# Patient Record
Sex: Male | Born: 1955 | Race: White | Hispanic: No | Marital: Married | Smoking: Current some day smoker
Health system: Southern US, Community
[De-identification: ages and names within clinical notes are randomized; demographics above are authoritative.]

## PROBLEM LIST (undated history)

## (undated) DIAGNOSIS — J479 Bronchiectasis, uncomplicated: Secondary | ICD-10-CM

## (undated) DIAGNOSIS — Z87438 Personal history of other diseases of male genital organs: Secondary | ICD-10-CM

## (undated) DIAGNOSIS — G20 Parkinson's disease: Secondary | ICD-10-CM

## (undated) DIAGNOSIS — N529 Male erectile dysfunction, unspecified: Secondary | ICD-10-CM

## (undated) DIAGNOSIS — Z8619 Personal history of other infectious and parasitic diseases: Secondary | ICD-10-CM

## (undated) DIAGNOSIS — N183 Chronic kidney disease, stage 3 unspecified: Secondary | ICD-10-CM

## (undated) DIAGNOSIS — R6889 Other general symptoms and signs: Secondary | ICD-10-CM

## (undated) DIAGNOSIS — K219 Gastro-esophageal reflux disease without esophagitis: Secondary | ICD-10-CM

## (undated) DIAGNOSIS — M1732 Unilateral post-traumatic osteoarthritis, left knee: Secondary | ICD-10-CM

## (undated) DIAGNOSIS — D721 Eosinophilia, unspecified: Secondary | ICD-10-CM

## (undated) DIAGNOSIS — Z87898 Personal history of other specified conditions: Secondary | ICD-10-CM

## (undated) DIAGNOSIS — J45909 Unspecified asthma, uncomplicated: Secondary | ICD-10-CM

## (undated) DIAGNOSIS — G20B1 Parkinson's disease with dyskinesia, without mention of fluctuations: Secondary | ICD-10-CM

## (undated) DIAGNOSIS — R21 Rash and other nonspecific skin eruption: Secondary | ICD-10-CM

## (undated) DIAGNOSIS — R569 Unspecified convulsions: Secondary | ICD-10-CM

## (undated) DIAGNOSIS — I700 Atherosclerosis of aorta: Secondary | ICD-10-CM

## (undated) DIAGNOSIS — R3129 Other microscopic hematuria: Secondary | ICD-10-CM

## (undated) DIAGNOSIS — J189 Pneumonia, unspecified organism: Secondary | ICD-10-CM

## (undated) DIAGNOSIS — K429 Umbilical hernia without obstruction or gangrene: Secondary | ICD-10-CM

## (undated) DIAGNOSIS — G8929 Other chronic pain: Secondary | ICD-10-CM

## (undated) DIAGNOSIS — I2510 Atherosclerotic heart disease of native coronary artery without angina pectoris: Secondary | ICD-10-CM

## (undated) DIAGNOSIS — E781 Pure hyperglyceridemia: Secondary | ICD-10-CM

## (undated) DIAGNOSIS — G20A1 Parkinson's disease without dyskinesia, without mention of fluctuations: Secondary | ICD-10-CM

## (undated) DIAGNOSIS — M1990 Unspecified osteoarthritis, unspecified site: Secondary | ICD-10-CM

## (undated) HISTORY — DX: Other general symptoms and signs: R68.89

## (undated) HISTORY — DX: Atherosclerotic heart disease of native coronary artery without angina pectoris: I25.10

## (undated) HISTORY — DX: Personal history of other diseases of male genital organs: Z87.438

## (undated) HISTORY — DX: Chronic kidney disease, stage 3 unspecified: N18.30

## (undated) HISTORY — DX: Eosinophilia, unspecified: D72.10

## (undated) HISTORY — PX: KNEE ARTHROSCOPY: SHX127

## (undated) HISTORY — DX: Parkinson's disease with dyskinesia, without mention of fluctuations: G20.B1

## (undated) HISTORY — DX: Unilateral post-traumatic osteoarthritis, left knee: M17.32

## (undated) HISTORY — PX: COLONOSCOPY: SHX174

## (undated) HISTORY — PX: ROTATOR CUFF REPAIR: SHX139

## (undated) HISTORY — PX: BACK SURGERY: SHX140

## (undated) HISTORY — DX: Other chronic pain: G89.29

## (undated) HISTORY — DX: Atherosclerosis of aorta: I70.0

## (undated) HISTORY — DX: Umbilical hernia without obstruction or gangrene: K42.9

## (undated) HISTORY — DX: Unspecified convulsions: R56.9

## (undated) HISTORY — DX: Personal history of other infectious and parasitic diseases: Z86.19

## (undated) HISTORY — DX: Bronchiectasis, uncomplicated: J47.9

## (undated) HISTORY — PX: APPENDECTOMY: SHX54

## (undated) HISTORY — DX: Personal history of other specified conditions: Z87.898

## (undated) HISTORY — DX: Male erectile dysfunction, unspecified: N52.9

## (undated) HISTORY — DX: Pure hyperglyceridemia: E78.1

## (undated) HISTORY — DX: Parkinson's disease: G20

## (undated) HISTORY — DX: Other microscopic hematuria: R31.29

## (undated) HISTORY — PX: KNEE ARTHROSCOPY W/ ACL RECONSTRUCTION: SHX1858

## (undated) HISTORY — PX: RADIAL KERATOTOMY: SHX217

---

## 2004-08-09 ENCOUNTER — Emergency Department: Payer: Self-pay | Admitting: Emergency Medicine

## 2004-08-09 ENCOUNTER — Other Ambulatory Visit: Payer: Self-pay

## 2004-09-08 ENCOUNTER — Emergency Department: Payer: Self-pay | Admitting: Emergency Medicine

## 2004-09-23 ENCOUNTER — Emergency Department: Payer: Self-pay | Admitting: Emergency Medicine

## 2005-08-28 ENCOUNTER — Emergency Department: Payer: Self-pay | Admitting: Emergency Medicine

## 2005-09-06 ENCOUNTER — Other Ambulatory Visit: Payer: Self-pay

## 2005-09-06 ENCOUNTER — Inpatient Hospital Stay: Payer: Self-pay | Admitting: Internal Medicine

## 2006-11-15 ENCOUNTER — Ambulatory Visit: Payer: Self-pay | Admitting: Gastroenterology

## 2008-01-10 ENCOUNTER — Ambulatory Visit: Payer: Self-pay | Admitting: Unknown Physician Specialty

## 2008-01-18 ENCOUNTER — Ambulatory Visit: Payer: Self-pay | Admitting: Unknown Physician Specialty

## 2008-01-31 ENCOUNTER — Encounter: Payer: Self-pay | Admitting: Unknown Physician Specialty

## 2008-02-04 ENCOUNTER — Encounter: Payer: Self-pay | Admitting: Unknown Physician Specialty

## 2008-03-05 ENCOUNTER — Encounter: Payer: Self-pay | Admitting: Unknown Physician Specialty

## 2008-04-05 ENCOUNTER — Encounter: Payer: Self-pay | Admitting: Unknown Physician Specialty

## 2008-05-06 ENCOUNTER — Encounter: Payer: Self-pay | Admitting: Unknown Physician Specialty

## 2010-06-30 ENCOUNTER — Ambulatory Visit: Payer: Self-pay | Admitting: Neurology

## 2011-10-12 ENCOUNTER — Emergency Department: Payer: Self-pay | Admitting: Emergency Medicine

## 2011-10-15 ENCOUNTER — Encounter: Payer: Self-pay | Admitting: Emergency Medicine

## 2011-11-04 ENCOUNTER — Encounter: Payer: Self-pay | Admitting: Emergency Medicine

## 2011-12-05 ENCOUNTER — Encounter: Payer: Self-pay | Admitting: Emergency Medicine

## 2011-12-20 ENCOUNTER — Ambulatory Visit: Payer: Self-pay | Admitting: Unknown Physician Specialty

## 2011-12-24 DIAGNOSIS — E781 Pure hyperglyceridemia: Secondary | ICD-10-CM | POA: Insufficient documentation

## 2011-12-24 DIAGNOSIS — N529 Male erectile dysfunction, unspecified: Secondary | ICD-10-CM | POA: Insufficient documentation

## 2011-12-24 DIAGNOSIS — K429 Umbilical hernia without obstruction or gangrene: Secondary | ICD-10-CM | POA: Insufficient documentation

## 2011-12-24 DIAGNOSIS — E349 Endocrine disorder, unspecified: Secondary | ICD-10-CM | POA: Insufficient documentation

## 2013-07-24 ENCOUNTER — Encounter: Payer: Self-pay | Admitting: Neurology

## 2013-08-03 ENCOUNTER — Encounter: Payer: Self-pay | Admitting: Neurology

## 2014-01-10 ENCOUNTER — Ambulatory Visit: Payer: Self-pay | Admitting: Internal Medicine

## 2014-02-19 ENCOUNTER — Ambulatory Visit: Payer: Self-pay | Admitting: Internal Medicine

## 2015-09-09 ENCOUNTER — Other Ambulatory Visit: Payer: Self-pay | Admitting: Student

## 2015-09-09 DIAGNOSIS — G5701 Lesion of sciatic nerve, right lower limb: Secondary | ICD-10-CM

## 2015-09-17 ENCOUNTER — Ambulatory Visit: Admission: RE | Admit: 2015-09-17 | Payer: Self-pay | Source: Ambulatory Visit

## 2015-09-18 ENCOUNTER — Ambulatory Visit
Admission: RE | Admit: 2015-09-18 | Discharge: 2015-09-18 | Disposition: A | Discharge status: Home / Self Care | Payer: BC Managed Care – PPO | Source: Ambulatory Visit | Attending: Student | Admitting: Student

## 2015-09-18 DIAGNOSIS — G5701 Lesion of sciatic nerve, right lower limb: Secondary | ICD-10-CM

## 2015-09-18 MED ORDER — TRIAMCINOLONE ACETONIDE 40 MG/ML IJ SUSP (RADIOLOGY): 40.0000 mg | Freq: Once | INTRAMUSCULAR | Status: DC

## 2015-09-18 MED ORDER — IOPAMIDOL (ISOVUE-300) INJECTION 61%: 10.0000 mL | Freq: Once | INTRAVENOUS | Status: DC | PRN

## 2015-09-18 MED ORDER — BUPIVACAINE HCL (PF) 0.5 % IJ SOLN
5.0000 mL | Freq: Once | INTRAMUSCULAR | Status: DC
  Filled 2015-09-18: qty 10

## 2015-09-18 MED ORDER — BUPIVACAINE HCL (PF) 0.25 % IJ SOLN
3.0000 mL | Freq: Once | INTRAMUSCULAR | Status: DC
  Filled 2015-09-18: qty 10

## 2015-11-07 ENCOUNTER — Other Ambulatory Visit: Payer: Self-pay | Admitting: Student

## 2015-11-07 DIAGNOSIS — M5416 Radiculopathy, lumbar region: Secondary | ICD-10-CM

## 2015-11-10 ENCOUNTER — Ambulatory Visit
Admission: RE | Admit: 2015-11-10 | Discharge: 2015-11-10 | Disposition: A | Discharge status: Home / Self Care | Payer: BC Managed Care – PPO | Source: Ambulatory Visit | Attending: Student | Admitting: Student

## 2015-11-10 DIAGNOSIS — M5416 Radiculopathy, lumbar region: Secondary | ICD-10-CM

## 2015-11-10 MED ORDER — IOPAMIDOL (ISOVUE-M 200) INJECTION 41%
1.0000 mL | Freq: Once | INTRAMUSCULAR | Status: AC
Start: 2015-11-10 — End: 2015-11-10
  Administered 2015-11-10: 1 mL via EPIDURAL

## 2015-11-10 MED ORDER — METHYLPREDNISOLONE ACETATE 40 MG/ML INJ SUSP (RADIOLOG
120.0000 mg | Freq: Once | INTRAMUSCULAR | Status: AC
  Administered 2015-11-10: 120 mg via EPIDURAL

## 2015-11-10 NOTE — Discharge Instructions (Signed)

## 2016-01-14 DIAGNOSIS — G20 Parkinson's disease: Secondary | ICD-10-CM | POA: Insufficient documentation

## 2016-01-29 ENCOUNTER — Other Ambulatory Visit: Payer: Self-pay | Admitting: Student

## 2016-01-29 DIAGNOSIS — M5416 Radiculopathy, lumbar region: Secondary | ICD-10-CM

## 2016-02-11 ENCOUNTER — Ambulatory Visit
Admission: RE | Admit: 2016-02-11 | Discharge: 2016-02-11 | Disposition: A | Discharge status: Home / Self Care | Payer: BC Managed Care – PPO | Source: Ambulatory Visit | Attending: Student | Admitting: Student

## 2016-02-11 DIAGNOSIS — M5416 Radiculopathy, lumbar region: Secondary | ICD-10-CM

## 2016-02-11 MED ORDER — METHYLPREDNISOLONE ACETATE 40 MG/ML INJ SUSP (RADIOLOG
120.0000 mg | Freq: Once | INTRAMUSCULAR | Status: AC
  Administered 2016-02-11: 120 mg via EPIDURAL

## 2016-02-11 MED ORDER — IOPAMIDOL (ISOVUE-M 200) INJECTION 41%
1.0000 mL | Freq: Once | INTRAMUSCULAR | Status: AC
  Administered 2016-02-11: 1 mL via EPIDURAL

## 2016-02-11 NOTE — Discharge Instructions (Signed)

## 2016-04-15 ENCOUNTER — Other Ambulatory Visit: Payer: Self-pay | Admitting: Neurosurgery

## 2016-04-30 ENCOUNTER — Encounter (HOSPITAL_COMMUNITY): Payer: Self-pay

## 2016-04-30 ENCOUNTER — Encounter (HOSPITAL_COMMUNITY)
Admission: RE | Admit: 2016-04-30 | Discharge: 2016-04-30 | Disposition: A | Discharge status: Home / Self Care | Payer: Medicare Other | Source: Ambulatory Visit | Attending: Neurosurgery | Admitting: Neurosurgery

## 2016-04-30 DIAGNOSIS — M4310 Spondylolisthesis, site unspecified: Secondary | ICD-10-CM | POA: Diagnosis not present

## 2016-04-30 DIAGNOSIS — Z01812 Encounter for preprocedural laboratory examination: Secondary | ICD-10-CM | POA: Insufficient documentation

## 2016-04-30 HISTORY — DX: Unspecified osteoarthritis, unspecified site: M19.90

## 2016-04-30 HISTORY — DX: Rash and other nonspecific skin eruption: R21

## 2016-04-30 HISTORY — DX: Parkinson's disease without dyskinesia, without mention of fluctuations: G20.A1

## 2016-04-30 HISTORY — DX: Unspecified asthma, uncomplicated: J45.909

## 2016-04-30 HISTORY — DX: Pneumonia, unspecified organism: J18.9

## 2016-04-30 HISTORY — DX: Parkinson's disease: G20

## 2016-04-30 HISTORY — DX: Gastro-esophageal reflux disease without esophagitis: K21.9

## 2016-04-30 LAB — BASIC METABOLIC PANEL
ANION GAP: 7 (ref 5–15)
BUN: 13 mg/dL (ref 6–20)
CALCIUM: 9.4 mg/dL (ref 8.9–10.3)
CO2: 27 mmol/L (ref 22–32)
Chloride: 105 mmol/L (ref 101–111)
Creatinine, Ser: 1.36 mg/dL — ABNORMAL HIGH (ref 0.61–1.24)
GFR calc Af Amer: >60 mL/min (ref >60)
GFR, EST NON AFRICAN AMERICAN: 55 mL/min — AB (ref >60)
GLUCOSE: 91 mg/dL (ref 65–99)
Potassium: 4.7 mmol/L (ref 3.5–5.1)
Sodium: 139 mmol/L (ref 135–145)

## 2016-04-30 LAB — CBC WITH DIFFERENTIAL/PLATELET
BASOS ABS: 0 10*3/uL (ref 0.0–0.1)
BASOS PCT: 1 %
EOS ABS: 0.2 10*3/uL (ref 0.0–0.7)
Eosinophils Relative: 3 %
HEMATOCRIT: 47.8 % (ref 39.0–52.0)
HEMOGLOBIN: 15.7 g/dL (ref 13.0–17.0)
Lymphocytes Relative: 26 %
Lymphs Abs: 2.3 10*3/uL (ref 0.7–4.0)
MCH: 29.3 pg (ref 26.0–34.0)
MCHC: 32.8 g/dL (ref 30.0–36.0)
MCV: 89.2 fL (ref 78.0–100.0)
Monocytes Absolute: 0.6 10*3/uL (ref 0.1–1.0)
Monocytes Relative: 7 %
NEUTROS ABS: 5.7 10*3/uL (ref 1.7–7.7)
NEUTROS PCT: 63 %
Platelets: 210 10*3/uL (ref 150–400)
RBC: 5.36 MIL/uL (ref 4.22–5.81)
RDW: 13.5 % (ref 11.5–15.5)
WBC: 8.8 10*3/uL (ref 4.0–10.5)

## 2016-04-30 LAB — TYPE AND SCREEN
ABO/RH(D): O POS
ANTIBODY SCREEN: NEGATIVE

## 2016-04-30 LAB — SURGICAL PCR SCREEN
MRSA, PCR: NEGATIVE
STAPHYLOCOCCUS AUREUS: NEGATIVE

## 2016-04-30 LAB — ABO/RH: ABO/RH(D): O POS

## 2016-04-30 NOTE — Pre-Procedure Instructions (Addendum)
    Cory [name]  04/30/2016     Your procedure is scheduled on Friday, February 2.  Report to [facility] Admitting at 10:10 AM               Your surgery or procedure is scheduled for 12:10 PM   Call this number if you have problems the morning of surgery: [phone]                For any other questions, please call [phone], Monday - Friday 8 AM - 4 PM.    Remember:  Do not eat food or drink liquids after midnight Thursday, February 1.  Take these medicines the morning of surgery with A SIP OF WATER : Amantadine, carbidopa-levodopa (SINEMET IR),  , rOPINIRole (REQUIP).                     Take if needed: tiZANidine (ZANAFLEX), glycopyrrolate (ROBINUL).                   1 Week prior to surgery STOP taking Aspirin  Aspirin Products (Goody Powder, Excedrin Migraine), Ibuprofen (Advil), Naproxen (Aleve), Viiamins and Herbal Products (ie Fish Oil)   Do not wear jewelry, make-up or nail polish.  Do not wear lotions, powders, or perfumes, or deodorant.    Men may shave face and neck.  Do not bring valuables to the hospital.  [facility] is not responsible for any belongings or valuables.  Contacts, dentures or bridgework may not be worn into surgery.  Leave your suitcase in the car.  After surgery it may be brought to your room.  For patients admitted to the hospital, discharge time will be determined by your treatment team.  Special instructions:  Review  [place] - Preparing For Surgery.  Please read over the following fact sheets that you were given: [facility]- Preparing For Surgery and Patient Instructions for Mupirocin Application, Coughing and Deep Brerathing, Pain Booklet

## 2016-05-07 ENCOUNTER — Inpatient Hospital Stay (HOSPITAL_COMMUNITY): Payer: Medicare Other

## 2016-05-07 ENCOUNTER — Inpatient Hospital Stay (HOSPITAL_COMMUNITY): Payer: Medicare Other | Admitting: Certified Registered Nurse Anesthetist

## 2016-05-07 ENCOUNTER — Inpatient Hospital Stay (HOSPITAL_COMMUNITY)
Admission: RE | Admit: 2016-05-07 | Discharge: 2016-05-08 | DRG: 455 | Disposition: A | Discharge status: Home / Self Care | Payer: Medicare Other | Source: Ambulatory Visit | Attending: Neurosurgery | Admitting: Neurosurgery

## 2016-05-07 ENCOUNTER — Encounter (HOSPITAL_COMMUNITY)
Admission: RE | Disposition: A | Discharge status: Home / Self Care | Payer: Self-pay | Source: Ambulatory Visit | Attending: Neurosurgery

## 2016-05-07 ENCOUNTER — Encounter (HOSPITAL_COMMUNITY): Payer: Self-pay | Admitting: Certified Registered Nurse Anesthetist

## 2016-05-07 DIAGNOSIS — Z87891 Personal history of nicotine dependence: Secondary | ICD-10-CM | POA: Diagnosis not present

## 2016-05-07 DIAGNOSIS — M4310 Spondylolisthesis, site unspecified: Secondary | ICD-10-CM | POA: Diagnosis present

## 2016-05-07 DIAGNOSIS — K219 Gastro-esophageal reflux disease without esophagitis: Secondary | ICD-10-CM | POA: Diagnosis present

## 2016-05-07 DIAGNOSIS — M48062 Spinal stenosis, lumbar region with neurogenic claudication: Secondary | ICD-10-CM | POA: Diagnosis present

## 2016-05-07 DIAGNOSIS — Z79899 Other long term (current) drug therapy: Secondary | ICD-10-CM

## 2016-05-07 DIAGNOSIS — M4316 Spondylolisthesis, lumbar region: Principal | ICD-10-CM | POA: Diagnosis present

## 2016-05-07 DIAGNOSIS — G20 Parkinson's disease: Secondary | ICD-10-CM | POA: Diagnosis present

## 2016-05-07 DIAGNOSIS — M545 Low back pain: Secondary | ICD-10-CM | POA: Diagnosis present

## 2016-05-07 DIAGNOSIS — Z419 Encounter for procedure for purposes other than remedying health state, unspecified: Secondary | ICD-10-CM

## 2016-05-07 SURGERY — POSTERIOR LUMBAR FUSION 1 LEVEL
Anesthesia: General | Site: Spine Lumbar

## 2016-05-07 MED ORDER — ROCURONIUM BROMIDE 50 MG/5ML IV SOSY
PREFILLED_SYRINGE | INTRAVENOUS | Status: AC
  Filled 2016-05-07: qty 5

## 2016-05-07 MED ORDER — HYDROCODONE-ACETAMINOPHEN 5-325 MG PO TABS: 1.0000 | ORAL_TABLET | ORAL | Status: DC | PRN

## 2016-05-07 MED ORDER — BUPIVACAINE HCL (PF) 0.25 % IJ SOLN
INTRAMUSCULAR | Status: AC
  Filled 2016-05-07: qty 30

## 2016-05-07 MED ORDER — DIAZEPAM 5 MG PO TABS
5.0000 mg | ORAL_TABLET | Freq: Four times a day (QID) | ORAL | Status: DC | PRN
  Administered 2016-05-08: 5 mg via ORAL
  Filled 2016-05-07: qty 1

## 2016-05-07 MED ORDER — 0.9 % SODIUM CHLORIDE (POUR BTL) OPTIME
TOPICAL | Status: DC | PRN
  Administered 2016-05-07: 1000 mL

## 2016-05-07 MED ORDER — LIDOCAINE 2% (20 MG/ML) 5 ML SYRINGE
INTRAMUSCULAR | Status: DC | PRN
  Administered 2016-05-07: 100 mg via INTRAVENOUS

## 2016-05-07 MED ORDER — FENTANYL CITRATE (PF) 100 MCG/2ML IJ SOLN
INTRAMUSCULAR | Status: AC
  Filled 2016-05-07: qty 4

## 2016-05-07 MED ORDER — TAMSULOSIN HCL 0.4 MG PO CAPS: 0.4000 mg | ORAL_CAPSULE | Freq: Every day | ORAL | Status: DC

## 2016-05-07 MED ORDER — MIDAZOLAM HCL 2 MG/2ML IJ SOLN
INTRAMUSCULAR | Status: AC
  Filled 2016-05-07: qty 2

## 2016-05-07 MED ORDER — PROPOFOL 10 MG/ML IV BOLUS
INTRAVENOUS | Status: DC | PRN
Start: 2016-05-07 — End: 2016-05-07
  Administered 2016-05-07: 150 mg via INTRAVENOUS

## 2016-05-07 MED ORDER — FENTANYL CITRATE (PF) 100 MCG/2ML IJ SOLN
INTRAMUSCULAR | Status: AC
  Filled 2016-05-07: qty 2

## 2016-05-07 MED ORDER — ACETAMINOPHEN 650 MG RE SUPP: 650.0000 mg | RECTAL | Status: DC | PRN

## 2016-05-07 MED ORDER — DEXAMETHASONE SODIUM PHOSPHATE 10 MG/ML IJ SOLN
INTRAMUSCULAR | Status: AC
  Filled 2016-05-07: qty 1

## 2016-05-07 MED ORDER — BACITRACIN 50000 UNITS IM SOLR
INTRAMUSCULAR | Status: DC | PRN
  Administered 2016-05-07: 13:00:00

## 2016-05-07 MED ORDER — ROCURONIUM BROMIDE 10 MG/ML (PF) SYRINGE
PREFILLED_SYRINGE | INTRAVENOUS | Status: DC | PRN
  Administered 2016-05-07 (×2): 50 mg via INTRAVENOUS

## 2016-05-07 MED ORDER — LACTATED RINGERS IV SOLN: INTRAVENOUS | Status: DC

## 2016-05-07 MED ORDER — CARBIDOPA-LEVODOPA ER 50-200 MG PO TBCR
1.0000 | EXTENDED_RELEASE_TABLET | Freq: Every evening | ORAL | Status: DC | PRN
  Filled 2016-05-07: qty 1

## 2016-05-07 MED ORDER — PHENYLEPHRINE HCL 10 MG/ML IJ SOLN
INTRAMUSCULAR | Status: DC | PRN
  Administered 2016-05-07 (×5): 80 ug via INTRAVENOUS

## 2016-05-07 MED ORDER — TIZANIDINE HCL 4 MG PO TABS
4.0000 mg | ORAL_TABLET | Freq: Four times a day (QID) | ORAL | Status: DC | PRN
  Administered 2016-05-07: 4 mg via ORAL
  Filled 2016-05-07: qty 1

## 2016-05-07 MED ORDER — SODIUM CHLORIDE 0.9% FLUSH
3.0000 mL | INTRAVENOUS | Status: DC | PRN
Start: 2016-05-07 — End: 2016-05-08

## 2016-05-07 MED ORDER — LACTATED RINGERS IV SOLN
INTRAVENOUS | Status: DC
Start: 2016-05-07 — End: 2016-05-07
  Administered 2016-05-07 (×3): via INTRAVENOUS

## 2016-05-07 MED ORDER — ROPINIROLE HCL 1 MG PO TABS
2.0000 mg | ORAL_TABLET | Freq: Three times a day (TID) | ORAL | Status: DC
  Administered 2016-05-07: 2 mg via ORAL

## 2016-05-07 MED ORDER — GLYCOPYRROLATE 0.2 MG/ML IV SOSY
PREFILLED_SYRINGE | INTRAVENOUS | Status: DC | PRN
  Administered 2016-05-07: 0.6 mg via INTRAVENOUS

## 2016-05-07 MED ORDER — PHENOL 1.4 % MT LIQD: 1.0000 | OROMUCOSAL | Status: DC | PRN

## 2016-05-07 MED ORDER — PHENYLEPHRINE 40 MCG/ML (10ML) SYRINGE FOR IV PUSH (FOR BLOOD PRESSURE SUPPORT)
PREFILLED_SYRINGE | INTRAVENOUS | Status: AC
  Filled 2016-05-07: qty 10

## 2016-05-07 MED ORDER — FENTANYL CITRATE (PF) 100 MCG/2ML IJ SOLN
INTRAMUSCULAR | Status: DC | PRN
  Administered 2016-05-07 (×2): 25 ug via INTRAVENOUS
  Administered 2016-05-07: 50 ug via INTRAVENOUS
  Administered 2016-05-07: 25 ug via INTRAVENOUS
  Administered 2016-05-07: 50 ug via INTRAVENOUS
  Administered 2016-05-07: 25 ug via INTRAVENOUS

## 2016-05-07 MED ORDER — METOCLOPRAMIDE HCL 5 MG/ML IJ SOLN: 10.0000 mg | Freq: Once | INTRAMUSCULAR | Status: DC | PRN

## 2016-05-07 MED ORDER — TAMSULOSIN HCL 0.4 MG PO CAPS
0.8000 mg | ORAL_CAPSULE | Freq: Once | ORAL | Status: AC
  Administered 2016-05-07: 0.8 mg via ORAL
  Filled 2016-05-07: qty 2

## 2016-05-07 MED ORDER — OXYCODONE-ACETAMINOPHEN 5-325 MG PO TABS
1.0000 | ORAL_TABLET | ORAL | Status: DC | PRN
  Administered 2016-05-07 – 2016-05-08 (×3): 2 via ORAL
  Filled 2016-05-07 (×3): qty 2

## 2016-05-07 MED ORDER — THROMBIN 20000 UNITS EX SOLR
CUTANEOUS | Status: AC
  Filled 2016-05-07: qty 20000

## 2016-05-07 MED ORDER — NEOSTIGMINE METHYLSULFATE 5 MG/5ML IV SOSY
PREFILLED_SYRINGE | INTRAVENOUS | Status: AC
  Filled 2016-05-07: qty 5

## 2016-05-07 MED ORDER — THROMBIN 20000 UNITS EX SOLR
CUTANEOUS | Status: DC | PRN
  Administered 2016-05-07: 13:00:00 via TOPICAL

## 2016-05-07 MED ORDER — FENTANYL CITRATE (PF) 100 MCG/2ML IJ SOLN
25.0000 ug | INTRAMUSCULAR | Status: DC | PRN
  Administered 2016-05-07: 50 ug via INTRAVENOUS

## 2016-05-07 MED ORDER — MEPERIDINE HCL 25 MG/ML IJ SOLN: 6.2500 mg | INTRAMUSCULAR | Status: DC | PRN

## 2016-05-07 MED ORDER — VANCOMYCIN HCL 1000 MG IV SOLR
INTRAVENOUS | Status: DC | PRN
  Administered 2016-05-07: 1000 mg via TOPICAL

## 2016-05-07 MED ORDER — ONDANSETRON HCL 4 MG/2ML IJ SOLN: 4.0000 mg | INTRAMUSCULAR | Status: DC | PRN

## 2016-05-07 MED ORDER — CEFAZOLIN SODIUM-DEXTROSE 2-3 GM-% IV SOLR
INTRAVENOUS | Status: DC | PRN
  Administered 2016-05-07: 2 g via INTRAVENOUS

## 2016-05-07 MED ORDER — HYDROMORPHONE HCL 1 MG/ML IJ SOLN
0.5000 mg | INTRAMUSCULAR | Status: DC | PRN
  Administered 2016-05-07: 1 mg via INTRAVENOUS
  Filled 2016-05-07: qty 1

## 2016-05-07 MED ORDER — LIDOCAINE 2% (20 MG/ML) 5 ML SYRINGE
INTRAMUSCULAR | Status: AC
  Filled 2016-05-07: qty 5

## 2016-05-07 MED ORDER — VANCOMYCIN HCL 1000 MG IV SOLR
INTRAVENOUS | Status: AC
  Filled 2016-05-07: qty 1000

## 2016-05-07 MED ORDER — NEOSTIGMINE METHYLSULFATE 5 MG/5ML IV SOSY
PREFILLED_SYRINGE | INTRAVENOUS | Status: DC | PRN
  Administered 2016-05-07: 4 mg via INTRAVENOUS

## 2016-05-07 MED ORDER — GLYCOPYRROLATE 1 MG PO TABS
1.0000 mg | ORAL_TABLET | Freq: Every day | ORAL | Status: DC | PRN
  Filled 2016-05-07: qty 1

## 2016-05-07 MED ORDER — DEXAMETHASONE SODIUM PHOSPHATE 10 MG/ML IJ SOLN
10.0000 mg | INTRAMUSCULAR | Status: AC
  Administered 2016-05-07: 10 mg via INTRAVENOUS

## 2016-05-07 MED ORDER — AMANTADINE HCL 100 MG PO CAPS
100.0000 mg | ORAL_CAPSULE | Freq: Three times a day (TID) | ORAL | Status: DC
  Administered 2016-05-07: 100 mg via ORAL
  Filled 2016-05-07 (×4): qty 1

## 2016-05-07 MED ORDER — ROPINIROLE HCL ER 2 MG PO TB24
2.0000 mg | ORAL_TABLET | Freq: Every evening | ORAL | Status: DC | PRN
  Filled 2016-05-07: qty 1

## 2016-05-07 MED ORDER — BUPIVACAINE HCL (PF) 0.25 % IJ SOLN
INTRAMUSCULAR | Status: DC | PRN
  Administered 2016-05-07: 20 mL

## 2016-05-07 MED ORDER — ROCURONIUM BROMIDE 50 MG/5ML IV SOSY
PREFILLED_SYRINGE | INTRAVENOUS | Status: AC
  Filled 2016-05-07: qty 10

## 2016-05-07 MED ORDER — CARBIDOPA-LEVODOPA 25-100 MG PO TABS
1.0000 | ORAL_TABLET | Freq: Three times a day (TID) | ORAL | Status: DC
  Administered 2016-05-07: 1 via ORAL
  Filled 2016-05-07 (×4): qty 1

## 2016-05-07 MED ORDER — ONDANSETRON HCL 4 MG/2ML IJ SOLN
INTRAMUSCULAR | Status: AC
  Filled 2016-05-07: qty 2

## 2016-05-07 MED ORDER — ONDANSETRON HCL 4 MG/2ML IJ SOLN
INTRAMUSCULAR | Status: DC | PRN
  Administered 2016-05-07: 4 mg via INTRAVENOUS

## 2016-05-07 MED ORDER — PHENYLEPHRINE HCL 10 MG/ML IJ SOLN
INTRAVENOUS | Status: DC | PRN
  Administered 2016-05-07: 25 ug/min via INTRAVENOUS

## 2016-05-07 MED ORDER — MENTHOL 3 MG MT LOZG: 1.0000 | LOZENGE | OROMUCOSAL | Status: DC | PRN

## 2016-05-07 MED ORDER — SODIUM CHLORIDE 0.9% FLUSH
3.0000 mL | Freq: Two times a day (BID) | INTRAVENOUS | Status: DC
  Administered 2016-05-07: 3 mL via INTRAVENOUS

## 2016-05-07 MED ORDER — CEFAZOLIN SODIUM-DEXTROSE 2-4 GM/100ML-% IV SOLN
INTRAVENOUS | Status: AC
  Filled 2016-05-07: qty 100

## 2016-05-07 MED ORDER — MIDAZOLAM HCL 5 MG/5ML IJ SOLN
INTRAMUSCULAR | Status: DC | PRN
  Administered 2016-05-07: 2 mg via INTRAVENOUS

## 2016-05-07 MED ORDER — PROPOFOL 10 MG/ML IV BOLUS
INTRAVENOUS | Status: AC
  Filled 2016-05-07: qty 20

## 2016-05-07 MED ORDER — CEFAZOLIN IN D5W 1 GM/50ML IV SOLN
1.0000 g | Freq: Three times a day (TID) | INTRAVENOUS | Status: AC
  Administered 2016-05-07 – 2016-05-08 (×2): 1 g via INTRAVENOUS
  Filled 2016-05-07 (×2): qty 50

## 2016-05-07 MED ORDER — ACETAMINOPHEN 325 MG PO TABS: 650.0000 mg | ORAL_TABLET | ORAL | Status: DC | PRN

## 2016-05-07 MED FILL — Heparin Sodium (Porcine) Inj 1000 Unit/ML: INTRAMUSCULAR | Qty: 30 | Status: AC

## 2016-05-07 MED FILL — Sodium Chloride IV Soln 0.9%: INTRAVENOUS | Qty: 1000 | Status: AC

## 2016-05-07 SURGICAL SUPPLY — 57 items
BAG DECANTER FOR FLEXI CONT (MISCELLANEOUS) ×2 IMPLANT
BENZOIN TINCTURE PRP APPL 2/3 (GAUZE/BANDAGES/DRESSINGS) ×4 IMPLANT
BUR CUTTER 7.0 ROUND (BURR) IMPLANT
BUR MATCHSTICK NEURO 3.0 LAGG (BURR) ×2 IMPLANT
CANISTER SUCT 3000ML PPV (MISCELLANEOUS) ×2 IMPLANT
CAP LCK SPNE (Orthopedic Implant) ×4 IMPLANT
CAP LOCK SPINE RADIUS (Orthopedic Implant) ×4 IMPLANT
CAP LOCKING (Orthopedic Implant) ×4 IMPLANT
CARTRIDGE OIL MAESTRO DRILL (MISCELLANEOUS) ×1 IMPLANT
CONT SPEC 4OZ CLIKSEAL STRL BL (MISCELLANEOUS) ×2 IMPLANT
COVER BACK TABLE 60X90IN (DRAPES) ×2 IMPLANT
DERMABOND ADVANCED (GAUZE/BANDAGES/DRESSINGS) ×1
DERMABOND ADVANCED .7 DNX12 (GAUZE/BANDAGES/DRESSINGS) ×1 IMPLANT
DEVICE INTERBODY ELEVATE 10X23 (Cage) ×4 IMPLANT
DIFFUSER DRILL AIR PNEUMATIC (MISCELLANEOUS) ×2 IMPLANT
DRAPE C-ARM 42X72 X-RAY (DRAPES) ×4 IMPLANT
DRAPE HALF SHEET 40X57 (DRAPES) ×2 IMPLANT
DRAPE LAPAROTOMY 100X72X124 (DRAPES) ×2 IMPLANT
DRAPE POUCH INSTRU U-SHP 10X18 (DRAPES) ×2 IMPLANT
DRAPE SURG 17X23 STRL (DRAPES) ×8 IMPLANT
DRSG OPSITE POSTOP 4X6 (GAUZE/BANDAGES/DRESSINGS) ×2 IMPLANT
DURAPREP 26ML APPLICATOR (WOUND CARE) ×2 IMPLANT
ELECT REM PT RETURN 9FT ADLT (ELECTROSURGICAL) ×2
ELECTRODE REM PT RTRN 9FT ADLT (ELECTROSURGICAL) ×1 IMPLANT
EVACUATOR 1/8 PVC DRAIN (DRAIN) IMPLANT
GAUZE SPONGE 4X4 12PLY STRL (GAUZE/BANDAGES/DRESSINGS) ×2 IMPLANT
GAUZE SPONGE 4X4 16PLY XRAY LF (GAUZE/BANDAGES/DRESSINGS) IMPLANT
GLOVE BIOGEL PI IND STRL 7.5 (GLOVE) ×3 IMPLANT
GLOVE BIOGEL PI INDICATOR 7.5 (GLOVE) ×3
GLOVE ECLIPSE 7.0 STRL STRAW (GLOVE) ×2 IMPLANT
GLOVE ECLIPSE 9.0 STRL (GLOVE) ×4 IMPLANT
GLOVE SS N UNI LF 7.0 STRL (GLOVE) ×2 IMPLANT
GOWN STRL REUS W/ TWL LRG LVL3 (GOWN DISPOSABLE) ×2 IMPLANT
GOWN STRL REUS W/ TWL XL LVL3 (GOWN DISPOSABLE) ×3 IMPLANT
GOWN STRL REUS W/TWL 2XL LVL3 (GOWN DISPOSABLE) IMPLANT
GOWN STRL REUS W/TWL LRG LVL3 (GOWN DISPOSABLE) ×2
GOWN STRL REUS W/TWL XL LVL3 (GOWN DISPOSABLE) ×3
KIT BASIN OR (CUSTOM PROCEDURE TRAY) ×2 IMPLANT
KIT ROOM TURNOVER OR (KITS) ×2 IMPLANT
NEEDLE HYPO 22GX1.5 SAFETY (NEEDLE) ×2 IMPLANT
NS IRRIG 1000ML POUR BTL (IV SOLUTION) ×2 IMPLANT
OIL CARTRIDGE MAESTRO DRILL (MISCELLANEOUS) ×2
PACK LAMINECTOMY NEURO (CUSTOM PROCEDURE TRAY) ×2 IMPLANT
ROD RADIUS 40MM (Neuro Prosthesis/Implant) ×2 IMPLANT
ROD SPNL 40X5.5XNS TI RDS (Neuro Prosthesis/Implant) ×2 IMPLANT
SCREW 6.75X40MM (Screw) ×4 IMPLANT
SCREW 6.75X45MM (Screw) ×4 IMPLANT
SPONGE SURGIFOAM ABS GEL 100 (HEMOSTASIS) ×2 IMPLANT
STRIP CLOSURE SKIN 1/2X4 (GAUZE/BANDAGES/DRESSINGS) ×2 IMPLANT
SUT VIC AB 0 CT1 18XCR BRD8 (SUTURE) ×1 IMPLANT
SUT VIC AB 0 CT1 8-18 (SUTURE) ×1
SUT VIC AB 2-0 CT1 18 (SUTURE) ×2 IMPLANT
SUT VIC AB 3-0 SH 8-18 (SUTURE) ×2 IMPLANT
TOWEL OR 17X24 6PK STRL BLUE (TOWEL DISPOSABLE) ×2 IMPLANT
TOWEL OR 17X26 10 PK STRL BLUE (TOWEL DISPOSABLE) ×2 IMPLANT
TRAY FOLEY W/METER SILVER 16FR (SET/KITS/TRAYS/PACK) ×2 IMPLANT
WATER STERILE IRR 1000ML POUR (IV SOLUTION) ×2 IMPLANT

## 2016-05-07 NOTE — Op Note (Signed)
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  L4-L5 posterior lumbar interbody fusion utilizing interbody expandable cages and local autograft  L4-5 posterior lateral arthrodesis utilizing nonsegmental pedicle screw fixation and local autograft  Surgeon:Fritzie Prioleau A. Jon, M.D.  Asst. Surgeon: Kathyrn Sheriff  Anesthesia: General  Indication: 61 year old male with back and lower extremity pain failing conservative management. Workup demonstrates evidence of a grade 1 L4-5 degenerative spondylolisthesis with marked facet arthropathy and stenosis. Patient presents now for decompression and fusion.  Operative note: After induction of anesthesia, patient position prone onto Wilson frame and a properly padded. Lumbar region prepped and draped sterilely. Incision made overlying L4-5. Dissection performed bilaterally. Retractor placed. Fluoroscopy used. Levels confirmed. Decompressive laminotomies and foraminotomies were performed using high-speed drill Kerrison rongeurs and Leksell rongeurs to remove the inferior two thirds of the lamina of L4 than entire inferior facet and pars and articularis of L4 bilaterally. The superior facet of L5 was mostly removed. The superior rim the L5 lamina was also resected. Ligament flavum elevated and resected. Foraminotomies completed along the course of L5 and L4 nerve roots. Bilateral discectomies performed. Disc space prepared for interbody fusion. Disc space distractor left the patient's right side. Disc space prepared for interbody fusion. A 10 mm standard [employer] expandable cage packed with locally harvested autograft was then impacted into place and  expanded to its full extent. Distractor removed patient's right side. Disc space once again prepared for interbody fusion. Soft tissue removed and interspace. Morselize autograft packed and interspace. A second cage packed with autograft was then impacted into place and then expanded to its full extent. Pedicles of L4 and L5 were notified using surface landmarks and intraoperative fluoroscopy or supination bone right the pedicle was removed using high-speed drill. Each pedicles and probed using pedicle awl each pedicle awl track was probed and found to be solidly within the bone. Each pedicle awl track was then tapped. 6.75 mm radius brand screws from [employer] were placed bilaterally at L4 and L5. Final images revealed good position of the implants and the hardware at the proper upper level with improved alignment of spine. Wounds and irrigated with and like solution. Short segment titanium rods placed over the screw heads at L4 and L5. Locking caps and placed over the screws. Locking caps were then engaged with the construct under compression. Transverse processes decorticated using high-speed drill. Morselized autograft packed posterior laterally. Gelfoam placed over the laminotomy defects. Vancomycin powder placed the deep wound space. Wounds and close in layers of Vicryl sutures. Steri-Strips and sterile dressing were applied. There were no apparent complications. Patient tolerated procedure well and he returns to the recovery room postop.

## 2016-05-07 NOTE — H&P (Signed)
Cory [name] is an 61 y.o. male.   Chief Complaint: Back pain HPI: 61 year old with back and bilateral lower extremity pain worsened with standing and ambulation. Workup demonstrates evidence of a grade 1 L4-5 degenerative spondylolisthesis with lateral recess and foraminal stenosis. Patient has failed conservative management and presents now for decompression and fusion in hopes of improving his symptoms.  Past Medical History:  Diagnosis Date  . Arthritis    hips and back  . Asthma    as a baby-   . GERD (gastroesophageal reflux disease)    rare -   . Parkinson's disease ([place])   . Pneumonia    age 19  . Rash    legs- on going    Past Surgical History:  Procedure Laterality Date  . APPENDECTOMY    . COLONOSCOPY    . KNEE ARTHROSCOPY Bilateral    ACL  . RADIAL KERATOTOMY    . ROTATOR CUFF REPAIR Bilateral     History reviewed. No pertinent family history. Social History:  reports that he quit smoking about 6 years ago. His smoking use included Cigarettes and Cigars. He has a 69.00 pack-year smoking history. He has never used smokeless tobacco. He reports that he drinks about 0.6 oz of alcohol per week . His drug history is not on file.  Allergies: No Known Allergies  Medications Prior to Admission  Medication Sig Dispense Refill  . Amantadine HCl 100 MG tablet Take 100 mg by mouth 3 (three) times daily.    . carbidopa-levodopa (SINEMET CR) 50-200 MG tablet 1 tablet at bedtime as needed (for tremors.).     Marland Kitchen carbidopa-levodopa (SINEMET IR) 25-100 MG tablet Take 1 tablet by mouth 3 (three) times daily.    . Cyanocobalamin (VITAMIN B-12 PO) Take 1 tablet by mouth daily.    Marland Kitchen glycopyrrolate (ROBINUL) 1 MG tablet Take 1 mg by mouth daily as needed (for secretions).    Marland Kitchen HYDROcodone-acetaminophen (NORCO/VICODIN) 5-325 MG tablet Take 1-2 tablets by mouth every 4 (four) hours as needed for moderate pain.    Marland Kitchen OVER THE COUNTER MEDICATION Take 1 capsule by mouth daily. FOCUS  FACTOR NUTRITION FOR THE BRAIN.    Marland Kitchen rOPINIRole (REQUIP XL) 2 MG 24 hr tablet Take 2 mg by mouth at bedtime as needed (for tremors).    Marland Kitchen rOPINIRole (REQUIP) 1 MG tablet Take 2 mg by mouth 3 (three) times daily.     Marland Kitchen terbinafine (LAMISIL) 1 % cream Apply 1 application topically every 3 (three) days. For rash.    Marland Kitchen tiZANidine (ZANAFLEX) 4 MG tablet Take 4 mg by mouth every 6 (six) hours as needed for muscle spasms.      No results found for this or any previous visit (from the past 48 hour(s)). No results found.  Pertinent items noted in HPI and remainder of comprehensive ROS otherwise negative.  Blood pressure 124/82, pulse 71, temperature 97.8 F (36.6 C), temperature source Oral, resp. rate 16, SpO2 100 %.  Patient is awake and alert. He is oriented and appropriate. Speech is fluent. Judgment and insight are intact. Motor and sensory function extremities normal. Reflexes hypoactive but symmetric. No evidence of long track signs. Examination head ears eyes and throat is unremarkable. Chest and abdomen are benign. Extremities are free from injury deformity. Assessment/Plan L4-L5 degenerative spondylolisthesis, grade 1 with neurogenic claudication. Plan bilateral L4-5 decompressive laminotomies and foraminotomies followed by posterior lumbar interbody fusion utilizing interbody expandable cages, locally harvested autograft, and augmented with posterior lateral arthrodesis  utilizing nonsegmental pedicle screw fixation and local autografting. Risks and benefits of been explained. Patient wishes to proceed.  Cory [name] A 05/07/2016, 11:24 AM

## 2016-05-07 NOTE — Transfer of Care (Signed)
Immediate Anesthesia Transfer of Care Note  Patient: Cory [name] [facility]  Procedure(s) Performed: Procedure(s): LUMBAR FOUR-FIVE POSTERIOR LUMBAR INTERBODY FUSION (N/A)  Patient Location: PACU  Anesthesia Type:General  Level of Consciousness: patient cooperative and responds to stimulation  Airway & Oxygen Therapy: Patient Spontanous Breathing and Patient connected to nasal cannula oxygen  Post-op Assessment: Report given to RN, Post -op Vital signs reviewed and stable and Patient moving all extremities X 4  Post vital signs: Reviewed and stable  Last Vitals:  Vitals:   05/07/16 1100 05/07/16 1355  BP: 124/82   Pulse: 71   Resp: 16   Temp: 36.6 C (P) 36.5 C    Last Pain:  Vitals:   05/07/16 1355  TempSrc:   PainSc: (P) 0-No pain         Complications: No apparent anesthesia complications

## 2016-05-07 NOTE — Brief Op Note (Signed)
05/07/2016  1:45 PM  PATIENT:  Cory [name]  61 y.o. male  PRE-OPERATIVE DIAGNOSIS:  Degenerative spondylolisthesis  POST-OPERATIVE DIAGNOSIS:  Degenerative spondylolisthesis  PROCEDURE:  Procedure(s): LUMBAR FOUR-FIVE POSTERIOR LUMBAR INTERBODY FUSION (N/A)  SURGEON:  Surgeon(s) and Role:    * Earnie Larsson, MD - Primary    * Consuella Lose, MD - Assisting  PHYSICIAN ASSISTANT:   ASSISTANTS:    ANESTHESIA:   general  EBL:  Total I/O In: 1000 [I.V.:1000] Out: 140 [Urine:90; Blood:50]  BLOOD ADMINISTERED:none  DRAINS: none   LOCAL MEDICATIONS USED:  MARCAINE     SPECIMEN:  No Specimen  DISPOSITION OF SPECIMEN:  N/A  COUNTS:  YES  TOURNIQUET:  * No tourniquets in log *  DICTATION: .Dragon Dictation  PLAN OF CARE: Admit to inpatient   PATIENT DISPOSITION:  PACU - hemodynamically stable.   Delay start of Pharmacological VTE agent (>24hrs) due to surgical blood loss or risk of bleeding: yes

## 2016-05-07 NOTE — Anesthesia Postprocedure Evaluation (Signed)
Anesthesia Post Note  Patient: Cory [name]  Procedure(s) Performed: Procedure(s) (LRB): LUMBAR FOUR-FIVE POSTERIOR LUMBAR INTERBODY FUSION (N/A)  Patient location during evaluation: PACU Anesthesia Type: General Level of consciousness: awake and alert Pain management: pain level controlled Vital Signs Assessment: post-procedure vital signs reviewed and stable Respiratory status: spontaneous breathing, nonlabored ventilation, respiratory function stable and patient connected to nasal cannula oxygen Cardiovascular status: blood pressure returned to baseline and stable Postop Assessment: no signs of nausea or vomiting Anesthetic complications: no       Last Vitals:  Vitals:   05/07/16 1430 05/07/16 1538  BP:  136/81  Pulse: 72 77  Resp: 11 16  Temp:      Last Pain:  Vitals:   05/07/16 1558  TempSrc:   PainSc: 7                  Montez Hageman

## 2016-05-07 NOTE — Anesthesia Preprocedure Evaluation (Addendum)
Anesthesia Evaluation  Patient identified by MRN, date of birth, ID band Patient awake    Airway Mallampati: II  TM Distance: >3 FB Neck ROM: Full    Dental  (+) Teeth Intact, Dental Advisory Given, Loose,    Pulmonary asthma , former smoker,    breath sounds clear to auscultation       Cardiovascular  Rhythm:Regular Rate:Normal     Neuro/Psych Parkinson's    GI/Hepatic GERD  ,  Endo/Other    Renal/GU      Musculoskeletal  (+) Arthritis ,   Abdominal Normal abdominal exam  (+)   Peds  Hematology   Anesthesia Other Findings   Reproductive/Obstetrics                            Anesthesia Physical Anesthesia Plan  ASA: II  Anesthesia Plan: General   Post-op Pain Management:    Induction: Intravenous  Airway Management Planned: Oral ETT  Additional Equipment:   Intra-op Plan:   Post-operative Plan: Extubation in OR  Informed Consent: I have reviewed the patients History and Physical, chart, labs and discussed the procedure including the risks, benefits and alternatives for the proposed anesthesia with the patient or authorized representative who has indicated his/her understanding and acceptance.     Plan Discussed with:   Anesthesia Plan Comments:         Anesthesia Quick Evaluation

## 2016-05-08 MED ORDER — DIAZEPAM 5 MG PO TABS: 5.0000 mg | ORAL_TABLET | Freq: Four times a day (QID) | ORAL | 0 refills | Status: DC | PRN

## 2016-05-08 MED ORDER — OXYCODONE-ACETAMINOPHEN 5-325 MG PO TABS: 1.0000 | ORAL_TABLET | ORAL | 0 refills | Status: DC | PRN

## 2016-05-08 NOTE — Discharge Summary (Signed)
Physician Discharge Summary  Patient ID: Cory [name] MRN: [id number] DOB/AGE: 1956-01-08 61 y.o.  Admit date: 05/07/2016 Discharge date: 05/08/2016  Admission Diagnoses:  Spondylolisthesis  Discharge Diagnoses:  Same Active Problems:   Degenerative spondylolisthesis   Discharged Condition: Stable  Hospital Course:  Cory [name] is a 61 y.o. male electively admitted after uncomplicated lumbar fusion. He had improvement in preop pain. He was ambulating well, tolerating diet, voiding normally.  Treatments: Surgery - PLIF, L4-5  Discharge Exam: Blood pressure 108/72, pulse 79, temperature 98.3 F (36.8 C), resp. rate 18, SpO2 95 %. Awake, alert, oriented Speech fluent, appropriate CN grossly intact 5/5 BUE/BLE Wound c/d/i  Disposition:   Discharge Instructions    Call MD for:  redness, tenderness, or signs of infection (pain, swelling, redness, odor or green/yellow discharge around incision site)    Complete by:  As directed    Call MD for:  temperature >100.4    Complete by:  As directed    Diet - low sodium heart healthy    Complete by:  As directed    Discharge instructions    Complete by:  As directed    Walk at home as much as possible, at least 4 times / day   Increase activity slowly    Complete by:  As directed    Lifting restrictions    Complete by:  As directed    No lifting > 10 lbs   May shower / Bathe    Complete by:  As directed    48 hours after surgery   May walk up steps    Complete by:  As directed    No dressing needed    Complete by:  As directed    Other Restrictions    Complete by:  As directed    No bending/twisting at waist     Allergies as of 05/08/2016   No Known Allergies     Medication List    TAKE these medications   Amantadine HCl 100 MG tablet Take 100 mg by mouth 3 (three) times daily.   carbidopa-levodopa 25-100 MG tablet Commonly known as:  SINEMET IR Take 1 tablet by mouth 3 (three) times daily.    carbidopa-levodopa 50-200 MG tablet Commonly known as:  SINEMET CR 1 tablet at bedtime as needed (for tremors.).   diazepam 5 MG tablet Commonly known as:  VALIUM Take 1-2 tablets (5-10 mg total) by mouth every 6 (six) hours as needed for muscle spasms.   glycopyrrolate 1 MG tablet Commonly known as:  ROBINUL Take 1 mg by mouth daily as needed (for secretions).   HYDROcodone-acetaminophen 5-325 MG tablet Commonly known as:  NORCO/VICODIN Take 1-2 tablets by mouth every 4 (four) hours as needed for moderate pain.   OVER THE COUNTER MEDICATION Take 1 capsule by mouth daily. FOCUS FACTOR NUTRITION FOR THE BRAIN.   oxyCODONE-acetaminophen 5-325 MG tablet Commonly known as:  PERCOCET/ROXICET Take 1-2 tablets by mouth every 4 (four) hours as needed for moderate pain.   rOPINIRole 1 MG tablet Commonly known as:  REQUIP Take 2 mg by mouth 3 (three) times daily.   rOPINIRole 2 MG 24 hr tablet Commonly known as:  REQUIP XL Take 2 mg by mouth at bedtime as needed (for tremors).   terbinafine 1 % cream Commonly known as:  LAMISIL Apply 1 application topically every 3 (three) days. For rash.   tiZANidine 4 MG tablet Commonly known as:  ZANAFLEX Take 4 mg by mouth every  6 (six) hours as needed for muscle spasms.   VITAMIN B-12 PO Take 1 tablet by mouth daily.            Durable Medical Equipment        Start     Ordered   05/07/16 1514  DME Walker rolling  Once    Question:  Patient needs a walker to treat with the following condition  Answer:  Degenerative spondylolisthesis   05/07/16 1513   05/07/16 1514  DME 3 n 1  Once     05/07/16 1513     Follow-up Information    POOL,HENRY A, MD Follow up.   Specialty:  Neurosurgery Contact information: [address] [place] [address] [phone]           Signed: Consuella Lose, Loletha Grayer 05/08/2016, 9:12 AM

## 2016-05-08 NOTE — Progress Notes (Signed)
Patient alert and oriented, mae's well, voiding adequate amount of urine, swallowing without difficulty, no c/o pain. Patient discharged home with family. Script and discharged instructions given to patient. Patient and family stated understanding of d/c instructions given and has an appointment with MD. 

## 2016-05-08 NOTE — Evaluation (Signed)
Occupational Therapy Evaluation Patient Details Name: Cory [name] MRN: [id number] DOB: Aug 28, 1955 Today's Date: 05/08/2016    History of Present Illness Pt is a 61 y/o male who presents s/p L4-L5 PLIF on 05/07/16.   Clinical Impression   Pt reports he was independent with ADL PTA. Currently pt overall mod I-independent for ADL and functional mobility. All back, safety, and ADL education complete with pt. Pt planning to d/c home with 24/7 supervision from family. No further acute OT needs identified; signing off at this time. Please re-consult if needs change. Thank you for this referral.    Follow Up Recommendations  No OT follow up;Supervision - Intermittent    Equipment Recommendations  None recommended by OT    Recommendations for Other Services       Precautions / Restrictions Precautions Precautions: Back Precaution Booklet Issued: No Precaution Comments: Pt able to recall 3/3 back precautions Required Braces or Orthoses: Spinal Brace Spinal Brace: Lumbar corset;Applied in sitting position Restrictions Weight Bearing Restrictions: No      Mobility Bed Mobility Overal bed mobility: Independent                Transfers Overall transfer level: Independent Equipment used: None                  Balance Overall balance assessment: No apparent balance deficits (not formally assessed)                                          ADL Overall ADL's : Modified independent                                       General ADL Comments: Educated pt on LB bathing/dressing technique, use of 2 cups for oral care, maintaining back precautions during functional activies, keeping frequently used items at counter top height, frequent mobility thorughout the day upon return home, peri care technique without twisting.     Vision     Perception     Praxis      Pertinent Vitals/Pain Pain Assessment: 0-10 Pain Score: 2  Pain  Location: back, incision Pain Descriptors / Indicators: Sore Pain Intervention(s): Monitored during session     Hand Dominance Right   Extremity/Trunk Assessment Upper Extremity Assessment Upper Extremity Assessment: Overall WFL for tasks assessed   Lower Extremity Assessment Lower Extremity Assessment: Defer to PT evaluation   Cervical / Trunk Assessment Cervical / Trunk Assessment: Other exceptions Cervical / Trunk Exceptions: s/p lumbar surgery.   Communication Communication Communication: No difficulties   Cognition Arousal/Alertness: Awake/alert Behavior During Therapy: WFL for tasks assessed/performed Overall Cognitive Status: Within Functional Limits for tasks assessed                     General Comments       Exercises       Shoulder Instructions      Home Living Family/patient expects to be discharged to:: Private residence Living Arrangements: Spouse/significant other Available Help at Discharge: Family;Available 24 hours/day Type of Home: House Home Access: Stairs to enter [employer] of Steps: 3 Entrance Stairs-Rails: Right Home Layout: One level     Bathroom Shower/Tub: Occupational psychologist: Standard Bathroom Accessibility: Yes   Home Equipment: Shower seat - built in  Prior Functioning/Environment Level of Independence: Independent                 OT Problem List:     OT Treatment/Interventions:      OT Goals(Current goals can be found in the care plan section) Acute Rehab OT Goals Patient Stated Goal: return home OT Goal Formulation: All assessment and education complete, DC therapy  OT Frequency:     Barriers to D/C:            Co-evaluation              End of Session Equipment Utilized During Treatment: Back brace Nurse Communication: Mobility status;Other (comment) (no f/u or equipment needs)  Activity Tolerance: Patient tolerated treatment well Patient left: in  chair;with call bell/phone within reach   Time: [id number] OT Time Calculation (min): 16 min Charges:  OT General Charges $OT Visit: 1 Procedure OT Evaluation $OT Eval Moderate Complexity: 1 Procedure G-Codes:     Binnie Kand M.S., OTR/L Pager: [phone]  05/08/2016, 11:14 AM

## 2016-05-08 NOTE — Evaluation (Signed)
Physical Therapy Evaluation and Discharge Patient Details Name: Cory [name] MRN: [id number] DOB: 11-30-1955 Today's Date: 05/08/2016   History of Present Illness  Pt is a 61 y/o male who presents s/p L4-L5 PLIF on 05/07/16.  Clinical Impression  Patient evaluated by Physical Therapy with no further acute PT needs identified. All education has been completed and the patient has no further questions. At the time of PT eval pt was able to perform transfers and ambulation with modified independence to independence. No AD required, and no LOB/unsteadiness noted. Discussed precautions at length and answered all questions to the best of my ability. See below for any follow-up Physical Therapy or equipment needs. PT is signing off. Thank you for this referral.        Follow Up Recommendations No PT follow up;Supervision - Intermittent    Equipment Recommendations  None recommended by PT    Recommendations for Other Services       Precautions / Restrictions Precautions Precautions: Back Precaution Booklet Issued: Yes (comment) Precaution Comments: Pt was educated on precautions. Handout reviewed in detail. Required Braces or Orthoses: Spinal Brace Spinal Brace: Lumbar corset;Applied in sitting position Restrictions Weight Bearing Restrictions: No      Mobility  Bed Mobility Overal bed mobility: Independent                Transfers Overall transfer level: Independent Equipment used: None                Ambulation/Gait Ambulation/Gait assistance: Modified independent (Device/Increase time) Ambulation Distance (Feet): 400 Feet Assistive device: None Gait Pattern/deviations: WFL(Within Functional Limits)   Gait velocity interpretation: at or above normal speed for age/gender    Stairs Stairs: Yes Stairs assistance: Modified independent (Device/Increase time) Stair Management: One rail Right;Alternating pattern;Step to pattern;Forwards Number of Stairs:  10 General stair comments: VC's for proper sequencing. No assist required.   Wheelchair Mobility    Modified Rankin (Stroke Patients Only)       Balance Overall balance assessment: No apparent balance deficits (not formally assessed)                                           Pertinent Vitals/Pain Pain Assessment: 0-10 Pain Score: 3  Pain Location: Incision site after stair training Pain Descriptors / Indicators: Operative site guarding Pain Intervention(s): Limited activity within patient's tolerance;Monitored during session;Repositioned    Home Living Family/patient expects to be discharged to:: Private residence Living Arrangements: Spouse/significant other Available Help at Discharge: Family;Available 24 hours/day Type of Home: House Home Access: Stairs to enter Entrance Stairs-Rails: Right Entrance Stairs-Number of Steps: 3 Home Layout: One level Home Equipment: Shower seat - built in      Prior Function Level of Independence: Independent               Hand Dominance   Dominant Hand: Right    Extremity/Trunk Assessment   Upper Extremity Assessment Upper Extremity Assessment: Overall WFL for tasks assessed    Lower Extremity Assessment Lower Extremity Assessment: Overall WFL for tasks assessed    Cervical / Trunk Assessment Cervical / Trunk Assessment: Other exceptions Cervical / Trunk Exceptions: s/p lumbar surgery.  Communication   Communication: No difficulties  Cognition Arousal/Alertness: Awake/alert Behavior During Therapy: WFL for tasks assessed/performed Overall Cognitive Status: Within Functional Limits for tasks assessed  General Comments      Exercises     Assessment/Plan    PT Assessment Patent does not need any further PT services  PT Problem List            PT Treatment Interventions      PT Goals (Current goals can be found in the Care Plan section)  Acute Rehab PT  Goals PT Goal Formulation: All assessment and education complete, DC therapy    Frequency     Barriers to discharge        Co-evaluation               End of Session Equipment Utilized During Treatment: Back brace Activity Tolerance: Patient tolerated treatment well Patient left: in chair;with call bell/phone within reach Nurse Communication: Mobility status         Time: 0724-0800 PT Time Calculation (min) (ACUTE ONLY): 36 min   Charges:   PT Evaluation $PT Eval Moderate Complexity: 1 Procedure PT Treatments $Gait Training: 8-22 mins   PT G Codes:        Thelma Comp May 25, 2016, 9:27 AM   Rolinda Roan, PT, DPT Acute Rehabilitation Services Pager: [phone]

## 2017-01-26 DIAGNOSIS — G8929 Other chronic pain: Secondary | ICD-10-CM | POA: Insufficient documentation

## 2017-04-27 DIAGNOSIS — G20 Parkinson's disease: Secondary | ICD-10-CM | POA: Insufficient documentation

## 2017-06-03 ENCOUNTER — Encounter: Payer: Self-pay | Admitting: *Deleted

## 2017-06-06 ENCOUNTER — Ambulatory Visit: Payer: Medicare Other | Admitting: Anesthesiology

## 2017-06-06 ENCOUNTER — Encounter
Admission: RE | Disposition: A | Discharge status: Home / Self Care | Payer: Self-pay | Source: Ambulatory Visit | Attending: Unknown Physician Specialty

## 2017-06-06 ENCOUNTER — Encounter: Payer: Self-pay | Admitting: *Deleted

## 2017-06-06 ENCOUNTER — Ambulatory Visit
Admission: RE | Admit: 2017-06-06 | Discharge: 2017-06-06 | Disposition: A | Discharge status: Home / Self Care | Payer: Medicare Other | Source: Ambulatory Visit | Attending: Unknown Physician Specialty | Admitting: Unknown Physician Specialty

## 2017-06-06 DIAGNOSIS — Z79899 Other long term (current) drug therapy: Secondary | ICD-10-CM | POA: Insufficient documentation

## 2017-06-06 DIAGNOSIS — D120 Benign neoplasm of cecum: Secondary | ICD-10-CM | POA: Diagnosis not present

## 2017-06-06 DIAGNOSIS — Z87891 Personal history of nicotine dependence: Secondary | ICD-10-CM | POA: Diagnosis not present

## 2017-06-06 DIAGNOSIS — K219 Gastro-esophageal reflux disease without esophagitis: Secondary | ICD-10-CM | POA: Insufficient documentation

## 2017-06-06 DIAGNOSIS — D123 Benign neoplasm of transverse colon: Secondary | ICD-10-CM | POA: Diagnosis not present

## 2017-06-06 DIAGNOSIS — D122 Benign neoplasm of ascending colon: Secondary | ICD-10-CM | POA: Insufficient documentation

## 2017-06-06 DIAGNOSIS — K5730 Diverticulosis of large intestine without perforation or abscess without bleeding: Secondary | ICD-10-CM | POA: Diagnosis not present

## 2017-06-06 DIAGNOSIS — G20 Parkinson's disease: Secondary | ICD-10-CM | POA: Diagnosis not present

## 2017-06-06 DIAGNOSIS — Z1211 Encounter for screening for malignant neoplasm of colon: Secondary | ICD-10-CM | POA: Insufficient documentation

## 2017-06-06 DIAGNOSIS — K640 First degree hemorrhoids: Secondary | ICD-10-CM | POA: Insufficient documentation

## 2017-06-06 DIAGNOSIS — J45909 Unspecified asthma, uncomplicated: Secondary | ICD-10-CM | POA: Insufficient documentation

## 2017-06-06 HISTORY — PX: COLONOSCOPY WITH PROPOFOL: SHX5780

## 2017-06-06 SURGERY — COLONOSCOPY WITH PROPOFOL
Anesthesia: General

## 2017-06-06 MED ORDER — PROPOFOL 10 MG/ML IV BOLUS
INTRAVENOUS | Status: AC
  Filled 2017-06-06: qty 20

## 2017-06-06 MED ORDER — LIDOCAINE HCL (CARDIAC) 20 MG/ML IV SOLN
INTRAVENOUS | Status: DC | PRN
  Administered 2017-06-06: 30 mg via INTRAVENOUS

## 2017-06-06 MED ORDER — SODIUM CHLORIDE 0.9 % IV SOLN
INTRAVENOUS | Status: DC
  Administered 2017-06-06: 1000 mL via INTRAVENOUS

## 2017-06-06 MED ORDER — PROPOFOL 10 MG/ML IV BOLUS
INTRAVENOUS | Status: DC | PRN
  Administered 2017-06-06 (×4): 30 mg via INTRAVENOUS
  Administered 2017-06-06: 70 mg via INTRAVENOUS
  Administered 2017-06-06 (×2): 30 mg via INTRAVENOUS

## 2017-06-06 MED ORDER — LIDOCAINE HCL (PF) 2 % IJ SOLN
INTRAMUSCULAR | Status: AC
  Filled 2017-06-06: qty 10

## 2017-06-06 MED ORDER — SODIUM CHLORIDE 0.9 % IV SOLN: INTRAVENOUS | Status: DC

## 2017-06-06 NOTE — H&P (Signed)
Primary Care Physician:  Adin Hector, MD Primary Gastroenterologist:  Dr. Vira Agar  Pre-Procedure History & Physical: HPI:  Cory [name] is a 62 y.o. male is here for an colonoscopy for colon cancer screening.   Past Medical History:  Diagnosis Date  . Arthritis    hips and back  . Asthma    as a baby-   . GERD (gastroesophageal reflux disease)    rare -   . Parkinson's disease ([place])   . Pneumonia    age 37  . Rash    legs- on going    Past Surgical History:  Procedure Laterality Date  . APPENDECTOMY    . BACK SURGERY     posterior lumbar spine fusion L4-5  . COLONOSCOPY    . KNEE ARTHROSCOPY Bilateral    ACL  . KNEE ARTHROSCOPY W/ ACL RECONSTRUCTION    . RADIAL KERATOTOMY    . ROTATOR CUFF REPAIR Bilateral     Prior to Admission medications   Medication Sig Start Date End Date Taking? Authorizing Provider  Amantadine HCl 100 MG tablet Take 100 mg by mouth 3 (three) times daily.   Yes [provider]  carbidopa-levodopa (SINEMET IR) 25-100 MG tablet Take 1 tablet by mouth 3 (three) times daily.   Yes [provider]  Cyanocobalamin (VITAMIN B-12 PO) Take 1 tablet by mouth daily.   Yes [provider]  glycopyrrolate (ROBINUL) 1 MG tablet Take 1 mg by mouth daily as needed (for secretions).   Yes [provider]  HYDROcodone-acetaminophen (NORCO/VICODIN) 5-325 MG tablet Take 1-2 tablets by mouth every 4 (four) hours as needed for moderate pain.   Yes [provider]  OVER THE COUNTER MEDICATION Take 1 capsule by mouth daily. FOCUS FACTOR NUTRITION FOR THE BRAIN.   Yes [provider]  oxyCODONE-acetaminophen (PERCOCET/ROXICET) 5-325 MG tablet Take 1-2 tablets by mouth every 4 (four) hours as needed for moderate pain. 05/08/16  Yes Consuella Lose, MD  carbidopa-levodopa (SINEMET CR) 50-200 MG tablet 1 tablet at bedtime as needed (for tremors.).     [provider]  diazepam (VALIUM) 5 MG tablet Take  1-2 tablets (5-10 mg total) by mouth every 6 (six) hours as needed for muscle spasms. Patient not taking: Reported on 06/06/2017 05/08/16   Consuella Lose, MD  rOPINIRole (REQUIP XL) 2 MG 24 hr tablet Take 2 mg by mouth at bedtime as needed (for tremors).    [provider]  rOPINIRole (REQUIP) 1 MG tablet Take 2 mg by mouth 3 (three) times daily.     [provider]  terbinafine (LAMISIL) 1 % cream Apply 1 application topically every 3 (three) days. For rash.    [provider]  tiZANidine (ZANAFLEX) 4 MG tablet Take 4 mg by mouth every 6 (six) hours as needed for muscle spasms.    [provider]    Allergies as of 03/18/2017  . (No Known Allergies)    History reviewed. No pertinent family history.  Social History   Socioeconomic History  . Marital status: Married    Spouse name: Not on file  . Number of children: Not on file  . Years of education: Not on file  . Highest education level: Not on file  Social Needs  . Financial resource strain: Not on file  . Food insecurity - worry: Not on file  . Food insecurity - inability: Not on file  . Transportation needs - medical: Not on file  . Transportation  needs - non-medical: Not on file  Occupational History  . Not on file  Tobacco Use  . Smoking status: Former Smoker    Packs/day: 3.00    Years: 23.00    Pack years: 69.00    Types: Cigarettes, Cigars    Last attempt to quit: 01/03/2010    Years since quitting: 7.4  . Smokeless tobacco: Never Used  . Tobacco comment: smokes 1 cigar per day 04/30/2016  Substance and Sexual Activity  . Alcohol use: Yes    Alcohol/week: 0.6 oz    Types: 1 Shots of liquor per week  . Drug use: No  . Sexual activity: Not on file  Other Topics Concern  . Not on file  Social History Narrative  . Not on file    Review of Systems: See HPI, otherwise negative ROS  Physical Exam: BP 135/85   Pulse 71   Temp 97.6 F (36.4 C) (Tympanic)   Resp 20   Ht 5'  8" (1.727 m)   Wt 78.5 kg (173 lb)   SpO2 100%   BMI 26.30 kg/m  General:   Alert,  pleasant and cooperative in NAD Head:  Normocephalic and atraumatic. Neck:  Supple; no masses or thyromegaly. Lungs:  Clear throughout to auscultation.    Heart:  Regular rate and rhythm. Abdomen:  Soft, nontender and nondistended. Normal bowel sounds, without guarding, and without rebound.   Neurologic:  Alert and  oriented x4;  grossly normal neurologically.  Impression/Plan: Cory [name] is here for an colonoscopy to be performed for colon cancer screening.  Risks, benefits, limitations, and alternatives regarding  colonoscopy have been reviewed with the patient.  Questions have been answered.  All parties agreeable.   Gaylyn Cheers, MD  06/06/2017, 2:29 PM

## 2017-06-06 NOTE — Anesthesia Preprocedure Evaluation (Signed)
Anesthesia Evaluation  Patient identified by MRN, date of birth, ID band Patient awake    Reviewed: Allergy & Precautions, NPO status , Patient's Chart, lab work & pertinent test results, reviewed documented beta blocker date and time   Airway Mallampati: II  TM Distance: >3 FB     Dental  (+) Chipped   Pulmonary asthma , pneumonia, resolved, former smoker,           Cardiovascular      Neuro/Psych    GI/Hepatic PUD, GERD  Controlled,  Endo/Other    Renal/GU      Musculoskeletal  (+) Arthritis ,   Abdominal   Peds  Hematology   Anesthesia Other Findings Parkinson.  Reproductive/Obstetrics                             Anesthesia Physical Anesthesia Plan  ASA: III  Anesthesia Plan: General   Post-op Pain Management:    Induction: Intravenous  PONV Risk Score and Plan:   Airway Management Planned:   Additional Equipment:   Intra-op Plan:   Post-operative Plan:   Informed Consent: I have reviewed the patients History and Physical, chart, labs and discussed the procedure including the risks, benefits and alternatives for the proposed anesthesia with the patient or authorized representative who has indicated his/her understanding and acceptance.     Plan Discussed with: CRNA  Anesthesia Plan Comments:         Anesthesia Quick Evaluation

## 2017-06-06 NOTE — Anesthesia Postprocedure Evaluation (Signed)
Anesthesia Post Note  Patient: Cory [name]  Procedure(s) Performed: COLONOSCOPY WITH PROPOFOL (N/A )  Patient location during evaluation: Endoscopy Anesthesia Type: General Level of consciousness: awake and alert Pain management: pain level controlled Vital Signs Assessment: post-procedure vital signs reviewed and stable Respiratory status: spontaneous breathing, nonlabored ventilation, respiratory function stable and patient connected to nasal cannula oxygen Cardiovascular status: blood pressure returned to baseline and stable Postop Assessment: no apparent nausea or vomiting Anesthetic complications: no     Last Vitals:  Vitals:   06/06/17 1508 06/06/17 1518  BP: 121/76 127/84  Pulse: 64 64  Resp: 19 13  Temp:    SpO2: 93% 100%    Last Pain:  Vitals:   06/06/17 1459  TempSrc: Tympanic                 Yamen Castrogiovanni S

## 2017-06-06 NOTE — Op Note (Signed)
[facility] Patient Name: Cory [name] Procedure Date: 06/06/2017 2:27 PM MRN: [id number] Account #: [id number] Date of Birth: 1955/10/16 Admit Type: Outpatient Age: 62 Room: [facility] ENDO ROOM 3 Gender: Male Note Status: Finalized Procedure:            Colonoscopy Indications:          Screening for colorectal malignant neoplasm Providers:            Manya Silvas, MD Medicines:            Propofol per Anesthesia Complications:        No immediate complications. Procedure:            Pre-Anesthesia Assessment:                       - After reviewing the risks and benefits, the patient                        was deemed in satisfactory condition to undergo the                        procedure.                       After obtaining informed consent, the colonoscope was                        passed under direct vision. Throughout the procedure,                        the patient's blood pressure, pulse, and oxygen                        saturations were monitored continuously. The                        Colonoscope was introduced through the anus and                        advanced to the the cecum, identified by appendiceal                        orifice and ileocecal valve. The colonoscopy was                        performed without difficulty. The patient tolerated the                        procedure well. The quality of the bowel preparation                        was good. Findings:      A small polyp was found in the transverse colon. The polyp was sessile.       The polyp was removed with a hot snare. Resection and retrieval were       complete.      A diminutive polyp was found in the cecum. The polyp was sessile. The       polyp was removed with a jumbo cold forceps. Resection and retrieval       were complete.      A diminutive polyp was found in  the ascending colon. The polyp was       sessile. The polyp was removed with a jumbo  cold forceps. Resection and       retrieval were complete.      Internal hemorrhoids were found during endoscopy. The hemorrhoids were       medium-sized and Grade I (internal hemorrhoids that do not prolapse).      Multiple small -medium-mouthed diverticula were found in the sigmoid       colon, descending colon, transverse colon and ascending colon. Impression:           - One small polyp in the transverse colon, removed with                        a hot snare. Resected and retrieved.                       - One diminutive polyp in the cecum, removed with a                        jumbo cold forceps. Resected and retrieved.                       - One diminutive polyp in the ascending colon, removed                        with a jumbo cold forceps. Resected and retrieved.                       - Internal hemorrhoids.                       - Diverticulosis in the sigmoid colon, in the                        descending colon, in the transverse colon and in the                        ascending colon. Recommendation:       - Await pathology results. Manya Silvas, MD 06/06/2017 2:58:22 PM This report has been signed electronically. Number of Addenda: 0 Note Initiated On: 06/06/2017 2:27 PM Scope Withdrawal Time: 0 hours 10 minutes 19 seconds  Total Procedure Duration: 0 hours 18 minutes 21 seconds       [facility]

## 2017-06-07 ENCOUNTER — Encounter: Payer: Self-pay | Admitting: Unknown Physician Specialty

## 2017-06-07 NOTE — Anesthesia Post-op Follow-up Note (Signed)
Anesthesia QCDR form completed.        

## 2017-06-07 NOTE — Transfer of Care (Signed)
Immediate Anesthesia Transfer of Care Note  Patient: Cory [name]  Procedure(s) Performed: COLONOSCOPY WITH PROPOFOL (N/A )  Patient Location: PACU  Anesthesia Type:General  Level of Consciousness: awake, alert  and oriented  Airway & Oxygen Therapy: Patient Spontanous Breathing  Post-op Assessment: Report given to RN and Post -op Vital signs reviewed and stable  Post vital signs: Reviewed and stable  Last Vitals:  Vitals:   06/06/17 1508 06/06/17 1518  BP: 121/76 127/84  Pulse: 64 64  Resp: 19 13  Temp:    SpO2: 93% 100%    Last Pain:  Vitals:   06/07/17 0755  TempSrc:   PainSc: 0-No pain      Patients Stated Pain Goal: 0 ([birth year] 6948)  Complications: No apparent anesthesia complications

## 2017-06-08 LAB — SURGICAL PATHOLOGY

## 2017-07-07 ENCOUNTER — Other Ambulatory Visit: Payer: Self-pay | Admitting: Neurology

## 2017-07-07 ENCOUNTER — Other Ambulatory Visit (HOSPITAL_COMMUNITY): Payer: Self-pay | Admitting: Neurology

## 2017-07-07 DIAGNOSIS — H539 Unspecified visual disturbance: Secondary | ICD-10-CM

## 2017-07-14 ENCOUNTER — Ambulatory Visit (HOSPITAL_COMMUNITY): Payer: Medicare Other

## 2017-07-14 ENCOUNTER — Encounter (HOSPITAL_COMMUNITY): Payer: Self-pay

## 2017-10-17 ENCOUNTER — Ambulatory Visit (HOSPITAL_COMMUNITY): Payer: Medicare Other

## 2017-10-21 ENCOUNTER — Ambulatory Visit (HOSPITAL_COMMUNITY)
Admission: RE | Admit: 2017-10-21 | Discharge: 2017-10-21 | Disposition: A | Discharge status: Home / Self Care | Payer: Medicare Other | Source: Ambulatory Visit | Attending: Neurology | Admitting: Neurology

## 2017-10-21 DIAGNOSIS — H539 Unspecified visual disturbance: Secondary | ICD-10-CM

## 2018-02-10 DIAGNOSIS — N183 Chronic kidney disease, stage 3 unspecified: Secondary | ICD-10-CM | POA: Insufficient documentation

## 2018-02-10 DIAGNOSIS — N1831 Chronic kidney disease, stage 3a: Secondary | ICD-10-CM | POA: Insufficient documentation

## 2018-04-08 ENCOUNTER — Emergency Department
Admission: EM | Admit: 2018-04-08 | Discharge: 2018-04-08 | Disposition: A | Discharge status: Home / Self Care | Payer: Medicare Other | Attending: Student in an Organized Health Care Education/Training Program | Admitting: Student in an Organized Health Care Education/Training Program

## 2018-04-08 ENCOUNTER — Emergency Department: Payer: Medicare Other

## 2018-04-08 ENCOUNTER — Other Ambulatory Visit: Payer: Self-pay

## 2018-04-08 ENCOUNTER — Encounter: Payer: Self-pay | Admitting: Emergency Medicine

## 2018-04-08 DIAGNOSIS — J45909 Unspecified asthma, uncomplicated: Secondary | ICD-10-CM | POA: Diagnosis not present

## 2018-04-08 DIAGNOSIS — R1031 Right lower quadrant pain: Secondary | ICD-10-CM | POA: Insufficient documentation

## 2018-04-08 DIAGNOSIS — Z87891 Personal history of nicotine dependence: Secondary | ICD-10-CM | POA: Diagnosis not present

## 2018-04-08 DIAGNOSIS — G20 Parkinson's disease: Secondary | ICD-10-CM | POA: Diagnosis not present

## 2018-04-08 DIAGNOSIS — R310 Gross hematuria: Secondary | ICD-10-CM | POA: Diagnosis not present

## 2018-04-08 DIAGNOSIS — Z79899 Other long term (current) drug therapy: Secondary | ICD-10-CM | POA: Insufficient documentation

## 2018-04-08 DIAGNOSIS — R109 Unspecified abdominal pain: Secondary | ICD-10-CM

## 2018-04-08 LAB — CBC
HEMATOCRIT: 47.7 % (ref 39.0–52.0)
HEMOGLOBIN: 15.4 g/dL (ref 13.0–17.0)
MCH: 29.2 pg (ref 26.0–34.0)
MCHC: 32.3 g/dL (ref 30.0–36.0)
MCV: 90.3 fL (ref 80.0–100.0)
PLATELETS: 215 10*3/uL (ref 150–400)
RBC: 5.28 MIL/uL (ref 4.22–5.81)
RDW: 13.2 % (ref 11.5–15.5)
WBC: 10.6 10*3/uL — AB (ref 4.0–10.5)
nRBC: 0 % (ref 0.0–0.2)

## 2018-04-08 LAB — URINALYSIS, COMPLETE (UACMP) WITH MICROSCOPIC
Bilirubin Urine: NEGATIVE
GLUCOSE, UA: NEGATIVE mg/dL
Ketones, ur: 5 mg/dL — AB
Leukocytes, UA: NEGATIVE
Nitrite: NEGATIVE
PH: 6 (ref 5.0–8.0)
Protein, ur: NEGATIVE mg/dL
SPECIFIC GRAVITY, URINE: 1.018 (ref 1.005–1.030)

## 2018-04-08 LAB — COMPREHENSIVE METABOLIC PANEL
ALK PHOS: 70 U/L (ref 38–126)
ALT: <5 U/L (ref 0–44)
ANION GAP: 6 (ref 5–15)
AST: 19 U/L (ref 15–41)
Albumin: 3.9 g/dL (ref 3.5–5.0)
BUN: 21 mg/dL (ref 8–23)
CALCIUM: 8.8 mg/dL — AB (ref 8.9–10.3)
CHLORIDE: 105 mmol/L (ref 98–111)
CO2: 25 mmol/L (ref 22–32)
Creatinine, Ser: 1.08 mg/dL (ref 0.61–1.24)
GFR calc Af Amer: >60 mL/min (ref >60)
GFR calc non Af Amer: >60 mL/min (ref >60)
GLUCOSE: 84 mg/dL (ref 70–99)
Potassium: 4.2 mmol/L (ref 3.5–5.1)
Sodium: 136 mmol/L (ref 135–145)
TOTAL PROTEIN: 6.9 g/dL (ref 6.5–8.1)
Total Bilirubin: 1.1 mg/dL (ref 0.3–1.2)

## 2018-04-08 MED ORDER — MORPHINE SULFATE (PF) 4 MG/ML IV SOLN
4.0000 mg | INTRAVENOUS | Status: DC | PRN
  Administered 2018-04-08: 4 mg via INTRAVENOUS
  Filled 2018-04-08: qty 1

## 2018-04-08 MED ORDER — ONDANSETRON HCL 4 MG PO TABS: 4.0000 mg | ORAL_TABLET | Freq: Every day | ORAL | 0 refills | Status: AC | PRN

## 2018-04-08 MED ORDER — TAMSULOSIN HCL 0.4 MG PO CAPS: 0.4000 mg | ORAL_CAPSULE | Freq: Every day | ORAL | 0 refills | Status: AC

## 2018-04-08 MED ORDER — HYDROCODONE-ACETAMINOPHEN 5-325 MG PO TABS: 1.0000 | ORAL_TABLET | ORAL | 0 refills | Status: DC | PRN

## 2018-04-08 MED ORDER — KETOROLAC TROMETHAMINE 30 MG/ML IJ SOLN
15.0000 mg | Freq: Once | INTRAMUSCULAR | Status: AC
  Administered 2018-04-08: 15 mg via INTRAVENOUS
  Filled 2018-04-08: qty 1

## 2018-04-08 MED ORDER — ONDANSETRON HCL 4 MG/2ML IJ SOLN
4.0000 mg | Freq: Once | INTRAMUSCULAR | Status: AC
  Administered 2018-04-08: 4 mg via INTRAVENOUS
  Filled 2018-04-08: qty 2

## 2018-04-08 NOTE — ED Notes (Signed)
Patient left for CT scan. 

## 2018-04-08 NOTE — ED Notes (Signed)
Patient wanted to take at-home meds for Parkinson. Dr. Alford Highland aware. Pharmacy was called due to IV medication being due. Patient opted not to take home meds.

## 2018-04-08 NOTE — ED Triage Notes (Signed)
Pt arrived with complaints of right flank pain that started Thursday. Pt states "the pain is worse today." PT also reports intermittent episodes of blood in urine.

## 2018-04-08 NOTE — ED Provider Notes (Signed)
[facility] Emergency Department Provider Note    First MD Initiated Contact with Patient 04/08/18 1118     (approximate)  I have reviewed the triage vital signs and the nursing notes.   HISTORY  Chief Complaint Flank Pain    HPI Cory [name] is a 63 y.o. male with below listed past medical history presents the ER with less than 1 week of intermittent right flank pain and dysuria with hematuria.  Denies any history of kidney stones.  Currently states the pain is mild to moderate.  Has not taken anything for the pain.  Does not currently smoke.  Quit roughly 2 months ago.  Denies any diarrhea or constipation.    Past Medical History:  Diagnosis Date  . Arthritis    hips and back  . Asthma    as a baby-   . GERD (gastroesophageal reflux disease)    rare -   . Parkinson's disease ([place])   . Pneumonia    age 74  . Rash    legs- on going   No family history on file. Past Surgical History:  Procedure Laterality Date  . APPENDECTOMY    . BACK SURGERY     posterior lumbar spine fusion L4-5  . COLONOSCOPY    . COLONOSCOPY WITH PROPOFOL N/A 06/06/2017   Procedure: COLONOSCOPY WITH PROPOFOL;  Surgeon: Manya Silvas, MD;  [facility]: [facility] ENDOSCOPY;  Service: Endoscopy;  Laterality: N/A;  . KNEE ARTHROSCOPY Bilateral    ACL  . KNEE ARTHROSCOPY W/ ACL RECONSTRUCTION    . RADIAL KERATOTOMY    . ROTATOR CUFF REPAIR Bilateral    Patient Active Problem List   Diagnosis Date Noted  . Degenerative spondylolisthesis 05/07/2016      Prior to Admission medications   Medication Sig Start Date End Date Taking? Authorizing Provider  Amantadine HCl 100 MG tablet Take 100 mg by mouth 3 (three) times daily.    [provider]  carbidopa-levodopa (SINEMET CR) 50-200 MG tablet 1 tablet at bedtime as needed (for tremors.).     [provider]  carbidopa-levodopa (SINEMET IR) 25-100 MG tablet Take 1 tablet by mouth 3 (three) times daily.     [provider]  Cyanocobalamin (VITAMIN B-12 PO) Take 1 tablet by mouth daily.    [provider]  diazepam (VALIUM) 5 MG tablet Take 1-2 tablets (5-10 mg total) by mouth every 6 (six) hours as needed for muscle spasms. Patient not taking: Reported on 06/06/2017 05/08/16   Consuella Lose, MD  glycopyrrolate (ROBINUL) 1 MG tablet Take 1 mg by mouth daily as needed (for secretions).    [provider]  HYDROcodone-acetaminophen (NORCO) 5-325 MG tablet Take 1 tablet by mouth every 4 (four) hours as needed for moderate pain. 04/08/18   Merlyn Lot, MD  HYDROcodone-acetaminophen (NORCO/VICODIN) 5-325 MG tablet Take 1-2 tablets by mouth every 4 (four) hours as needed for moderate pain.    [provider]  ondansetron (ZOFRAN) 4 MG tablet Take 1 tablet (4 mg total) by mouth daily as needed for nausea or vomiting. 04/08/18 04/08/19  Merlyn Lot, MD  OVER THE COUNTER MEDICATION Take 1 capsule by mouth daily. FOCUS FACTOR NUTRITION FOR THE BRAIN.    [provider]  oxyCODONE-acetaminophen (PERCOCET/ROXICET) 5-325 MG tablet Take 1-2 tablets by mouth every 4 (four) hours as needed for moderate pain. 05/08/16   Consuella Lose, MD  rOPINIRole (REQUIP XL) 2 MG 24 hr tablet Take 2 mg by mouth at  bedtime as needed (for tremors).    [provider]  rOPINIRole (REQUIP) 1 MG tablet Take 2 mg by mouth 3 (three) times daily.     [provider]  tamsulosin (FLOMAX) 0.4 MG CAPS capsule Take 1 capsule (0.4 mg total) by mouth daily after supper. 04/08/18   Merlyn Lot, MD  terbinafine (LAMISIL) 1 % cream Apply 1 application topically every 3 (three) days. For rash.    [provider]  tiZANidine (ZANAFLEX) 4 MG tablet Take 4 mg by mouth every 6 (six) hours as needed for muscle spasms.    [provider]    Allergies Patient has no known allergies.    Social History Social History   Tobacco Use  . Smoking status: Former  Smoker    Packs/day: 3.00    Years: 23.00    Pack years: 69.00    Types: Cigarettes, Cigars    Last attempt to quit: 01/03/2010    Years since quitting: 8.2  . Smokeless tobacco: Never Used  . Tobacco comment: smokes 1 cigar per day 04/30/2016  Substance Use Topics  . Alcohol use: Yes    Alcohol/week: 1.0 standard drinks    Types: 1 Shots of liquor per week  . Drug use: No    Review of Systems Patient denies headaches, rhinorrhea, blurry vision, numbness, shortness of breath, chest pain, edema, cough, abdominal pain, nausea, vomiting, diarrhea, dysuria, fevers, rashes or hallucinations unless otherwise stated above in HPI. ____________________________________________   PHYSICAL EXAM:  VITAL SIGNS: Vitals:   04/08/18 1220 04/08/18 1505  BP: (!) 143/76 129/84  Pulse: 62 65  Resp: 18 18  Temp:    SpO2: 96% 98%    Constitutional: Alert and oriented.  Eyes: Conjunctivae are normal.  Head: Atraumatic. Nose: No congestion/rhinnorhea. Mouth/Throat: Mucous membranes are moist.   Neck: No stridor. Painless ROM.  Cardiovascular: Normal rate, regular rhythm. Grossly normal heart sounds.  Good peripheral circulation. Respiratory: Normal respiratory effort.  No retractions. Lungs CTAB. Gastrointestinal: Soft and nontender. No distention. No abdominal bruits. + right CVA tenderness. Genitourinary:  Musculoskeletal: No lower extremity tenderness nor edema.  No joint effusions. Neurologic:  Normal speech and language. No gross focal neurologic deficits are appreciated. No facial droop Skin:  Skin is warm, dry and intact. No rash noted. Psychiatric: Mood and affect are normal. Speech and behavior are normal.  ____________________________________________   LABS (all labs ordered are listed, but only abnormal results are displayed)  Results for orders placed or performed during the hospital encounter of 04/08/18 (from the past 24 hour(s))  CBC     Status: Abnormal   Collection Time:  04/08/18 10:12 AM  Result Value Ref Range   WBC 10.6 (H) 4.0 - 10.5 K/uL   RBC 5.28 4.22 - 5.81 MIL/uL   Hemoglobin 15.4 13.0 - 17.0 g/dL   HCT 47.7 39.0 - 52.0 %   MCV 90.3 80.0 - 100.0 fL   MCH 29.2 26.0 - 34.0 pg   MCHC 32.3 30.0 - 36.0 g/dL   RDW 13.2 11.5 - 15.5 %   Platelets 215 150 - 400 K/uL   nRBC 0.0 0.0 - 0.2 %  Comprehensive metabolic panel     Status: Abnormal   Collection Time: 04/08/18 10:12 AM  Result Value Ref Range   Sodium 136 135 - 145 mmol/L   Potassium 4.2 3.5 - 5.1 mmol/L   Chloride 105 98 - 111 mmol/L   CO2 25 22 - 32 mmol/L   Glucose, Bld 84  70 - 99 mg/dL   BUN 21 8 - 23 mg/dL   Creatinine, Ser 1.08 0.61 - 1.24 mg/dL   Calcium 8.8 (L) 8.9 - 10.3 mg/dL   Total Protein 6.9 6.5 - 8.1 g/dL   Albumin 3.9 3.5 - 5.0 g/dL   AST 19 15 - 41 U/L   ALT <5 0 - 44 U/L   Alkaline Phosphatase 70 38 - 126 U/L   Total Bilirubin 1.1 0.3 - 1.2 mg/dL   GFR calc non Af Amer >60 >60 mL/min   GFR calc Af Amer >60 >60 mL/min   Anion gap 6 5 - 15  Urinalysis, Complete w Microscopic     Status: Abnormal   Collection Time: 04/08/18 12:31 PM  Result Value Ref Range   Color, Urine YELLOW (A) YELLOW   APPearance HAZY (A) CLEAR   Specific Gravity, Urine 1.018 1.005 - 1.030   pH 6.0 5.0 - 8.0   Glucose, UA NEGATIVE NEGATIVE mg/dL   Hgb urine dipstick SMALL (A) NEGATIVE   Bilirubin Urine NEGATIVE NEGATIVE   Ketones, ur 5 (A) NEGATIVE mg/dL   Protein, ur NEGATIVE NEGATIVE mg/dL   Nitrite NEGATIVE NEGATIVE   Leukocytes, UA NEGATIVE NEGATIVE   RBC / HPF 11-20 0 - 5 RBC/hpf   WBC, UA 0-5 0 - 5 WBC/hpf   Bacteria, UA RARE (A) NONE SEEN   Squamous Epithelial / LPF 0-5 0 - 5   Mucus PRESENT    Ca Oxalate Crys, UA PRESENT    ____________________________________________  EK____________________________________________  RADIOLOGY  I personally reviewed all radiographic images ordered to evaluate for the above acute complaints and reviewed radiology reports and findings.  These  findings were personally discussed with the patient.  Please see medical record for radiology report.  ____________________________________________   PROCEDURES  Procedure(s) performed:  Procedures    Critical Care performed: no ____________________________________________   INITIAL IMPRESSION / ASSESSMENT AND PLAN / ED COURSE  Pertinent labs & imaging results that were available during my care of the patient were reviewed by me and considered in my medical decision making (see chart for details).   DDX: stone, pyelo, aaa, msk strain, shingles, cystitis  Cory [name] is a 63 y.o. who presents to the ED with symptoms as described above.  Patient nontoxic-appearing.  Does appear uncomfortable however.  Seems clinically consistent with stone.  Possible infection.  Doubt appendicitis.  CT imaging will be ordered.  Will provide IV pain medication.  Clinical Course as of Apr 08 1714  Sat Apr 08, 2018  1400 Patient reassessed.  Pain improved.  Discussed results of imaging with patient.  Does not seem consistent with [place] or infectious process.  Discussed CT results and need for follow-up with urology as well as PCP.   [PR]    Clinical Course User Index [PR] Merlyn Lot, MD     As part of my medical decision making, I reviewed the following data within the [place] notes reviewed and incorporated, Labs reviewed, notes from prior ED visits   ____________________________________________   FINAL CLINICAL IMPRESSION(S) / ED DIAGNOSES  Final diagnoses:  Right flank pain  Gross hematuria      NEW MEDICATIONS STARTED DURING THIS VISIT:  Discharge Medication List as of 04/08/2018  2:03 PM    START taking these medications   Details  !! HYDROcodone-acetaminophen (NORCO) 5-325 MG tablet Take 1 tablet by mouth every 4 (four) hours as needed for moderate pain., Starting Sat 04/08/2018, Print  ondansetron (ZOFRAN) 4 MG tablet Take 1 tablet (4 mg  total) by mouth daily as needed for nausea or vomiting., Starting Sat 04/08/2018, Until Sun 04/08/2019, Print    tamsulosin (FLOMAX) 0.4 MG CAPS capsule Take 1 capsule (0.4 mg total) by mouth daily after supper., Starting Sat 04/08/2018, Print     !! - Potential duplicate medications found. Please discuss with provider.       Note:  This document was prepared using Dragon voice recognition software and may include unintentional dictation errors.    Merlyn Lot, MD 04/08/18 [phone]

## 2018-04-16 DIAGNOSIS — R911 Solitary pulmonary nodule: Secondary | ICD-10-CM | POA: Insufficient documentation

## 2018-05-29 ENCOUNTER — Emergency Department
Admission: EM | Admit: 2018-05-29 | Discharge: 2018-05-29 | Disposition: A | Discharge status: Home / Self Care | Payer: Medicare Other | Attending: Emergency Medicine | Admitting: Emergency Medicine

## 2018-05-29 ENCOUNTER — Encounter: Payer: Self-pay | Admitting: Emergency Medicine

## 2018-05-29 ENCOUNTER — Other Ambulatory Visit: Payer: Self-pay

## 2018-05-29 DIAGNOSIS — Y999 Unspecified external cause status: Secondary | ICD-10-CM | POA: Diagnosis not present

## 2018-05-29 DIAGNOSIS — J45909 Unspecified asthma, uncomplicated: Secondary | ICD-10-CM | POA: Insufficient documentation

## 2018-05-29 DIAGNOSIS — Z87891 Personal history of nicotine dependence: Secondary | ICD-10-CM | POA: Diagnosis not present

## 2018-05-29 DIAGNOSIS — S0001XA Abrasion of scalp, initial encounter: Secondary | ICD-10-CM | POA: Diagnosis not present

## 2018-05-29 DIAGNOSIS — W228XXA Striking against or struck by other objects, initial encounter: Secondary | ICD-10-CM | POA: Insufficient documentation

## 2018-05-29 DIAGNOSIS — Z79899 Other long term (current) drug therapy: Secondary | ICD-10-CM | POA: Diagnosis not present

## 2018-05-29 DIAGNOSIS — S0990XA Unspecified injury of head, initial encounter: Secondary | ICD-10-CM | POA: Diagnosis present

## 2018-05-29 DIAGNOSIS — Y929 Unspecified place or not applicable: Secondary | ICD-10-CM | POA: Insufficient documentation

## 2018-05-29 DIAGNOSIS — G20 Parkinson's disease: Secondary | ICD-10-CM | POA: Insufficient documentation

## 2018-05-29 DIAGNOSIS — Y939 Activity, unspecified: Secondary | ICD-10-CM | POA: Diagnosis not present

## 2018-05-29 NOTE — Discharge Instructions (Addendum)
Keep the area dry for 24 hours.  You may then shower, but do not scrub your scalp in the area of the abrasion.  Return to the emergency department for symptoms of change or worsen if you are unable to schedule an appointment with primary care.

## 2018-05-29 NOTE — ED Triage Notes (Signed)
Patient ambulatory to triage with steady gait, without difficulty or distress noted; pt reports PTA walked into boom bar on tractor; lac noted to top of scalp with no active bleeding;denies LOC or HA; denies pain

## 2018-05-29 NOTE — ED Notes (Signed)
PA discussed discharge w/ pt and wound care. Pt left before receiving written DC instructions. Pt stable and ambulatory prior to leaving as observed by [facility]

## 2018-05-29 NOTE — ED Provider Notes (Signed)
[facility] Emergency Department Provider Note  ____________________________________________  Time seen: Approximately 10:17 PM  I have reviewed the triage vital signs and the nursing notes.   HISTORY  Chief Complaint Laceration   HPI Cory [name] is a 63 y.o. male who presents to the emergency department for treatment and evaluation of a laceration to the scalp that occurred prior to arrival.  He walked into the boom bar on his tractor.  No loss of consciousness or headache.   Past Medical History:  Diagnosis Date  . Arthritis    hips and back  . Asthma    as a baby-   . GERD (gastroesophageal reflux disease)    rare -   . Parkinson's disease ([place])   . Pneumonia    age 63  . Rash    legs- on going    Patient Active Problem List   Diagnosis Date Noted  . Degenerative spondylolisthesis 05/07/2016    Past Surgical History:  Procedure Laterality Date  . APPENDECTOMY    . BACK SURGERY     posterior lumbar spine fusion L4-5  . COLONOSCOPY    . COLONOSCOPY WITH PROPOFOL N/A 06/06/2017   Procedure: COLONOSCOPY WITH PROPOFOL;  Surgeon: Manya Silvas, MD;  Location: [facility] ENDOSCOPY;  Service: Endoscopy;  Laterality: N/A;  . KNEE ARTHROSCOPY Bilateral    ACL  . KNEE ARTHROSCOPY W/ ACL RECONSTRUCTION    . RADIAL KERATOTOMY    . ROTATOR CUFF REPAIR Bilateral     Prior to Admission medications   Medication Sig Start Date End Date Taking? Authorizing Provider  Amantadine HCl 100 MG tablet Take 100 mg by mouth 3 (three) times daily.    [provider]  carbidopa-levodopa (SINEMET CR) 50-200 MG tablet 1 tablet at bedtime as needed (for tremors.).     [provider]  carbidopa-levodopa (SINEMET IR) 25-100 MG tablet Take 1 tablet by mouth 3 (three) times daily.    [provider]  Cyanocobalamin (VITAMIN B-12 PO) Take 1 tablet by mouth daily.    [provider]  diazepam (VALIUM) 5 MG tablet Take 1-2 tablets (5-10  mg total) by mouth every 6 (six) hours as needed for muscle spasms. Patient not taking: Reported on 06/06/2017 05/08/16   Consuella Lose, MD  glycopyrrolate (ROBINUL) 1 MG tablet Take 1 mg by mouth daily as needed (for secretions).    [provider]  HYDROcodone-acetaminophen (NORCO) 5-325 MG tablet Take 1 tablet by mouth every 4 (four) hours as needed for moderate pain. 04/08/18   Merlyn Lot, MD  HYDROcodone-acetaminophen (NORCO/VICODIN) 5-325 MG tablet Take 1-2 tablets by mouth every 4 (four) hours as needed for moderate pain.    [provider]  ondansetron (ZOFRAN) 4 MG tablet Take 1 tablet (4 mg total) by mouth daily as needed for nausea or vomiting. 04/08/18 04/08/19  Merlyn Lot, MD  OVER THE COUNTER MEDICATION Take 1 capsule by mouth daily. FOCUS FACTOR NUTRITION FOR THE BRAIN.    [provider]  oxyCODONE-acetaminophen (PERCOCET/ROXICET) 5-325 MG tablet Take 1-2 tablets by mouth every 4 (four) hours as needed for moderate pain. 05/08/16   Consuella Lose, MD  rOPINIRole (REQUIP XL) 2 MG 24 hr tablet Take 2 mg by mouth at bedtime as needed (for tremors).    [provider]  rOPINIRole (REQUIP) 1 MG tablet Take 2 mg by mouth 3 (three) times daily.     [provider]  tamsulosin (FLOMAX) 0.4 MG CAPS capsule Take 1 capsule (  0.4 mg total) by mouth daily after supper. 04/08/18   Merlyn Lot, MD  terbinafine (LAMISIL) 1 % cream Apply 1 application topically every 3 (three) days. For rash.    [provider]  tiZANidine (ZANAFLEX) 4 MG tablet Take 4 mg by mouth every 6 (six) hours as needed for muscle spasms.    [provider]    Allergies Patient has no known allergies.  No family history on file.  Social History Social History   Tobacco Use  . Smoking status: Former Smoker    Packs/day: 3.00    Years: 23.00    Pack years: 69.00    Types: Cigarettes, Cigars    Last attempt to quit: 01/03/2010    Years since  quitting: 8.4  . Smokeless tobacco: Never Used  . Tobacco comment: smokes 1 cigar per day 04/30/2016  Substance Use Topics  . Alcohol use: Yes    Alcohol/week: 1.0 standard drinks    Types: 1 Shots of liquor per week  . Drug use: No    Review of Systems  Constitutional: Negative for fever. Respiratory: Negative for cough or shortness of breath.  Musculoskeletal: Negative for myalgias Skin: Positive for scalp injury Neurological: Negative for numbness or paresthesias. ____________________________________________   PHYSICAL EXAM:  VITAL SIGNS: ED Triage Vitals [05/29/18 2100]  Enc Vitals Group     BP 131/87     Pulse Rate 73     Resp 18     Temp 97.9 F (36.6 C)     Temp Source Oral     SpO2 97 %     Weight 170 lb (77.1 kg)     Height 5\' 8"  (1.727 m)     Head Circumference      Peak Flow      Pain Score 0     Pain Loc      Pain Edu?      Excl. in [place]?      Constitutional: Well appearing. Eyes: Conjunctivae are clear without discharge or drainage. Nose: No rhinorrhea noted. Mouth/Throat: Airway is patent.  Neck: No stridor. Unrestricted range of motion observed. Cardiovascular: Capillary refill is <3 seconds.  Respiratory: Respirations are even and unlabored.. Musculoskeletal: Unrestricted range of motion observed. Neurologic: Awake, alert, and oriented x 4.  Skin: 1 cm superficial abrasion noted to the right scalp with scant amount of bleeding.  ____________________________________________   LABS (all labs ordered are listed, but only abnormal results are displayed)  Labs Reviewed - No data to display ____________________________________________  EKG  Not indicated. ____________________________________________  RADIOLOGY  Not indicated ____________________________________________   PROCEDURES  Procedures ____________________________________________   INITIAL IMPRESSION / ASSESSMENT AND PLAN / ED COURSE  Cory [name] is a 62 y.o. male  who presents to the emergency department for treatment and evaluation of scalp abrasion that occurred just prior to arrival.  He is up-to-date on his tetanus vaccination.  The area was cleaned and then a thin layer of Dermabond was applied just for closure and protection of the wound.  There is no active bleeding after this was applied.  Patient had no indication of head injury.  He is to follow-up with his primary care for any symptoms of concern.  He is to return to the emergency department if he is unable to schedule an appointment.   Medications - No data to display   Pertinent labs & imaging results that were available during my care of the patient were reviewed by me and considered in my  medical decision making (see chart for details).  ____________________________________________   FINAL CLINICAL IMPRESSION(S) / ED DIAGNOSES  Final diagnoses:  Abrasion of scalp, initial encounter    ED Discharge Orders    None       Note:  This document was prepared using Dragon voice recognition software and may include unintentional dictation errors.    Victorino Dike, FNP 05/29/18 2332    Nance Pear, MD 05/29/18 2337

## 2018-09-05 ENCOUNTER — Emergency Department
Admission: EM | Admit: 2018-09-05 | Discharge: 2018-09-05 | Disposition: A | Discharge status: Home / Self Care | Payer: Medicare Other | Attending: Emergency Medicine | Admitting: Emergency Medicine

## 2018-09-05 ENCOUNTER — Encounter: Payer: Self-pay | Admitting: Emergency Medicine

## 2018-09-05 ENCOUNTER — Other Ambulatory Visit: Payer: Self-pay

## 2018-09-05 DIAGNOSIS — Y93H9 Activity, other involving exterior property and land maintenance, building and construction: Secondary | ICD-10-CM | POA: Insufficient documentation

## 2018-09-05 DIAGNOSIS — Z79899 Other long term (current) drug therapy: Secondary | ICD-10-CM | POA: Insufficient documentation

## 2018-09-05 DIAGNOSIS — Y998 Other external cause status: Secondary | ICD-10-CM | POA: Diagnosis not present

## 2018-09-05 DIAGNOSIS — Z87891 Personal history of nicotine dependence: Secondary | ICD-10-CM | POA: Diagnosis not present

## 2018-09-05 DIAGNOSIS — G20 Parkinson's disease: Secondary | ICD-10-CM | POA: Diagnosis not present

## 2018-09-05 DIAGNOSIS — S0502XA Injury of conjunctiva and corneal abrasion without foreign body, left eye, initial encounter: Secondary | ICD-10-CM | POA: Insufficient documentation

## 2018-09-05 DIAGNOSIS — S0592XA Unspecified injury of left eye and orbit, initial encounter: Secondary | ICD-10-CM | POA: Diagnosis present

## 2018-09-05 DIAGNOSIS — Y92007 Garden or yard of unspecified non-institutional (private) residence as the place of occurrence of the external cause: Secondary | ICD-10-CM | POA: Insufficient documentation

## 2018-09-05 DIAGNOSIS — W228XXA Striking against or struck by other objects, initial encounter: Secondary | ICD-10-CM | POA: Diagnosis not present

## 2018-09-05 MED ORDER — ERYTHROMYCIN 5 MG/GM OP OINT
TOPICAL_OINTMENT | Freq: Once | OPHTHALMIC | Status: AC
  Administered 2018-09-05: 1 via OPHTHALMIC
  Filled 2018-09-05: qty 1

## 2018-09-05 MED ORDER — ERYTHROMYCIN 5 MG/GM OP OINT: TOPICAL_OINTMENT | OPHTHALMIC | 0 refills | Status: DC

## 2018-09-05 MED ORDER — TETRACAINE HCL 0.5 % OP SOLN
2.0000 [drp] | Freq: Once | OPHTHALMIC | Status: AC
  Administered 2018-09-05: 2 [drp] via OPHTHALMIC

## 2018-09-05 MED ORDER — FLUORESCEIN SODIUM 1 MG OP STRP
1.0000 | ORAL_STRIP | Freq: Once | OPHTHALMIC | Status: AC
  Administered 2018-09-05: 04:00:00 1 via OPHTHALMIC

## 2018-09-05 MED ORDER — FLUORESCEIN SODIUM 1 MG OP STRP
ORAL_STRIP | OPHTHALMIC | Status: AC
  Administered 2018-09-05: 1 via OPHTHALMIC
  Filled 2018-09-05: qty 1

## 2018-09-05 NOTE — ED Triage Notes (Addendum)
Patient ambulatory to triage with steady gait, without difficulty or distress noted; pt reports while bush hogging this afternoon felt something hit him in left eye; cont to have pain,redness and tearing; visual acuity rt eye 20/30 and left eye 20/70; 20/30 bilat

## 2018-09-05 NOTE — ED Notes (Signed)
Dr. Karma Greaser at the bedside to evaluate eye

## 2018-09-05 NOTE — ED Provider Notes (Signed)
[facility] Emergency Department Provider Note  ____________________________________________   First MD Initiated Contact with Patient 09/05/18 0400     (approximate)  I have reviewed the triage vital signs and the nursing notes.   HISTORY  Chief Complaint Eye Problem    HPI Cory [name] is a 63 y.o. male with no contributory past medical history who presents for evaluation of acute onset of severe pain in his left eye.  He was outside earlier today using a [employer] when he saw something fly up from the ground and hit him in the left eyeball.  He had acute onset of pain and irritation.  He has irrigated his eye multiple times with water and use artificial tears but he continues to have a lot of pain, redness, and irritation.  The pain is bad enough that it is hard for him to keep his eye open.  He does not have any other injuries and has no headache, neck pain, no recent fever, no difficulty breathing.  He believes it was a piece of wood or debris and does not think it was anything metal.  He does not wear corrective lenses of any kind and has never worn contacts.  He sees an ophthalmologist in town named Dr. Gloriann Loan.  The pain has persisted throughout the afternoon and evening it was sufficient overnight that he was not able to sleep so he came to the ED.        Past Medical History:  Diagnosis Date  . Arthritis    hips and back  . Asthma    as a baby-   . GERD (gastroesophageal reflux disease)    rare -   . Parkinson's disease ([place])   . Pneumonia    age 49  . Rash    legs- on going    Patient Active Problem List   Diagnosis Date Noted  . Degenerative spondylolisthesis 05/07/2016    Past Surgical History:  Procedure Laterality Date  . APPENDECTOMY    . BACK SURGERY     posterior lumbar spine fusion L4-5  . COLONOSCOPY    . COLONOSCOPY WITH PROPOFOL N/A 06/06/2017   Procedure: COLONOSCOPY WITH PROPOFOL;  Surgeon: Manya Silvas, MD;   Location: [facility] ENDOSCOPY;  Service: Endoscopy;  Laterality: N/A;  . KNEE ARTHROSCOPY Bilateral    ACL  . KNEE ARTHROSCOPY W/ ACL RECONSTRUCTION    . RADIAL KERATOTOMY    . ROTATOR CUFF REPAIR Bilateral     Prior to Admission medications   Medication Sig Start Date End Date Taking? Authorizing Provider  Amantadine HCl 100 MG tablet Take 100 mg by mouth 3 (three) times daily.    [provider]  carbidopa-levodopa (SINEMET CR) 50-200 MG tablet 1 tablet at bedtime as needed (for tremors.).     [provider]  carbidopa-levodopa (SINEMET IR) 25-100 MG tablet Take 1 tablet by mouth 3 (three) times daily.    [provider]  Cyanocobalamin (VITAMIN B-12 PO) Take 1 tablet by mouth daily.    [provider]  diazepam (VALIUM) 5 MG tablet Take 1-2 tablets (5-10 mg total) by mouth every 6 (six) hours as needed for muscle spasms. Patient not taking: Reported on 06/06/2017 05/08/16   Consuella Lose, MD  erythromycin ophthalmic ointment Apply a thin strip of ointment (about 1/4") to the lower eyelid of each affected eye every 6 hours x 1 week. 09/05/18   Hinda Kehr, MD  glycopyrrolate (ROBINUL) 1 MG tablet Take 1  mg by mouth daily as needed (for secretions).    [provider]  HYDROcodone-acetaminophen (NORCO) 5-325 MG tablet Take 1 tablet by mouth every 4 (four) hours as needed for moderate pain. 04/08/18   Merlyn Lot, MD  HYDROcodone-acetaminophen (NORCO/VICODIN) 5-325 MG tablet Take 1-2 tablets by mouth every 4 (four) hours as needed for moderate pain.    [provider]  ondansetron (ZOFRAN) 4 MG tablet Take 1 tablet (4 mg total) by mouth daily as needed for nausea or vomiting. 04/08/18 04/08/19  Merlyn Lot, MD  OVER THE COUNTER MEDICATION Take 1 capsule by mouth daily. FOCUS FACTOR NUTRITION FOR THE BRAIN.    [provider]  oxyCODONE-acetaminophen (PERCOCET/ROXICET) 5-325 MG tablet Take 1-2 tablets by mouth every 4 (four) hours  as needed for moderate pain. 05/08/16   Consuella Lose, MD  rOPINIRole (REQUIP XL) 2 MG 24 hr tablet Take 2 mg by mouth at bedtime as needed (for tremors).    [provider]  rOPINIRole (REQUIP) 1 MG tablet Take 2 mg by mouth 3 (three) times daily.     [provider]  tamsulosin (FLOMAX) 0.4 MG CAPS capsule Take 1 capsule (0.4 mg total) by mouth daily after supper. 04/08/18   Merlyn Lot, MD  terbinafine (LAMISIL) 1 % cream Apply 1 application topically every 3 (three) days. For rash.    [provider]  tiZANidine (ZANAFLEX) 4 MG tablet Take 4 mg by mouth every 6 (six) hours as needed for muscle spasms.    [provider]    Allergies Patient has no known allergies.  No family history on file.  Social History Social History   Tobacco Use  . Smoking status: Former Smoker    Packs/day: 3.00    Years: 23.00    Pack years: 69.00    Types: Cigarettes, Cigars    Last attempt to quit: 01/03/2010    Years since quitting: 8.6  . Smokeless tobacco: Never Used  . Tobacco comment: smokes 1 cigar per day 04/30/2016  Substance Use Topics  . Alcohol use: Yes    Alcohol/week: 1.0 standard drinks    Types: 1 Shots of liquor per week  . Drug use: No    Review of Systems Constitutional: No fever/chills Eyes: Left eye pain, irritation, and blurry vision as described above. ENT: No sore throat. Cardiovascular: Denies chest pain. Respiratory: Denies shortness of breath. Gastrointestinal: No abdominal pain.  No nausea, no vomiting.   Neurological: Negative for headaches, focal weakness or numbness.   ____________________________________________   PHYSICAL EXAM:  VITAL SIGNS: ED Triage Vitals [09/05/18 0140]  Enc Vitals Group     BP (!) 148/93     Pulse Rate 85     Resp 18     Temp 97.8 F (36.6 C)     Temp Source Oral     SpO2 98 %     Weight 77.1 kg (170 lb)     Height 1.727 m (5\' 8" )     Head Circumference      Peak Flow      Pain  Score 8     Pain Loc      Pain Edu?      Excl. in [place]?     Constitutional: Alert and oriented.  Generally well-appearing but he does appear quite uncomfortable. Eyes: Right eye is normal in appearance.  The left eye is difficult for him to open by himself.  When I opened it for him he has some mild chemosis,  severe conjunctival injection.  Pupils are equal and reactive bilaterally, circular in appearance and not at all misshapen.  There is no gross deformity to the eye and the left eye has no evidence of foreign body still present including after eversion of the upper and lower lids.  I placed tetracaine and fluorescein strip in the left eye and examined him with a [place] lamp and the patient has an obvious horizontal linear corneal abrasion near the bottom of his left cornea.  There is no dendritic pattern.  There is no "waterfall leakage"; Seidel Test is negative, and the interruption in the corneal appears stable.  I also examined the patient using a portable slit lamp for improved magnification and I do not see evidence of corneal laceration, it appears that there is significant corneal disruption in a horizontal pattern but there is no leakage of intraocular fluid and the corneal abrasion is relatively superficial. Head: atraumatic. Nose: No congestion/rhinnorhea. Mouth/Throat: Mucous membranes are moist. Cardiovascular: Normal rate, regular rhythm. Good peripheral circulation. Respiratory: Normal respiratory effort.  No retractions.  Neurologic:  Normal speech and language. No gross focal neurologic deficits are appreciated.  Skin:  Skin is warm, dry and intact. No rash noted. Psychiatric: Mood and affect are normal. Speech and behavior are normal.  ____________________________________________   LABS (all labs ordered are listed, but only abnormal results are displayed)  Labs Reviewed - No data to display ____________________________________________  EKG  None - EKG not ordered by ED  physician ____________________________________________  RADIOLOGY   ED MD interpretation:  No indication for imaging  Official radiology report(s): No results found.  ____________________________________________   PROCEDURES   Procedure(s) performed (including Critical Care):  Procedures   ____________________________________________   INITIAL IMPRESSION / MDM / [place] / ED COURSE  As part of my medical decision making, I reviewed the following data within the [place] notes reviewed and incorporated, Old chart reviewed and Notes from prior ED visits      *FARON TUDISCO was evaluated in Emergency Department on 09/05/2018 for the symptoms described in the history of present illness. He was evaluated in the context of the global COVID-19 pandemic, which necessitated consideration that the patient might be at risk for infection with the SARS-CoV-2 virus that causes COVID-19. Institutional protocols and algorithms that pertain to the evaluation of patients at risk for COVID-19 are in a state of rapid change based on information released by regulatory bodies including the CDC and federal and state organizations. These policies and algorithms were followed during the patient's care in the ED.  Some ED evaluations and interventions may be delayed as a result of limited staffing during the pandemic.*  Differential diagnosis includes, but is not limited to, corneal abrasion, corneal laceration, penetrating orb injury, retained foreign body, conjunctivitis, uveitis/iritis.  Patient clearly remembers an object coming up from the ground and hitting him in the left eye.  He has thoroughly irrigated his eye but continues to have pain.  He had immediate relief upon application of tetracaine drops.  Thorough physical exam as described above demonstrates corneal abrasion with no evidence of retained foreign body.  There does not appear to be a penetrating  globe injury and as documented above he has a negative Seidel test.  I provided erythromycin ointment and strongly encouraged him to see either his ophthalmologist or the [facility] later today for a follow-up visit and explain why.  He has oxycodone at home and is declining a  prescription for pain medication.  I gave strict return precautions and follow-up recommendations.      ____________________________________________  FINAL CLINICAL IMPRESSION(S) / ED DIAGNOSES  Final diagnoses:  Abrasion of left cornea, initial encounter     MEDICATIONS GIVEN DURING THIS VISIT:  Medications  tetracaine (PONTOCAINE) 0.5 % ophthalmic solution 2 drop (2 drops Left Eye Given 09/05/18 0413)  fluorescein ophthalmic strip 1 strip (1 strip Left Eye Given 09/05/18 0413)  erythromycin ophthalmic ointment (1 application Left Eye Given 09/05/18 0511)     ED Discharge Orders         Ordered    erythromycin ophthalmic ointment     09/05/18 0434           Note:  This document was prepared using Dragon voice recognition software and may include unintentional dictation errors.   Hinda Kehr, MD 09/05/18 [phone]

## 2018-09-05 NOTE — Discharge Instructions (Addendum)
You have no evidence of penetrating wound to your eyeball and no evidence of a persistent foreign body in your eye, but you do have an abrasion to the lower part of your cornea of your left eye.  These are very painful wounds although they usually heal within a couple of days.  Please use over-the-counter ibuprofen and/or Tylenol as needed.  Use the oxycodone you already have at home for severe pain but make sure not to operate any machinery including driving after you have taken the medicine.  It is important that you use the provided antibiotic ointment as instructed.  If the tube you were provided lasts for 5 to 7 days you do not need to fill the prescription.  Apply a small ribbon of ointment to the lower eyelid of your left eye every 6 hours while you are awake and make sure to put in some ointment just before going to sleep.  It is important that you follow-up with an ophthalmologist later today or tomorrow at the latest.  If your ophthalmologist's office is not currently open, please contact the [facility] and ask for an appointment either with Dr. George Ina or one of his colleagues.  Whether you call Dr. Gloriann Loan or the [facility], please let them know that you need a follow-up appointment after an overnight emergency department visit for an injury to your eye that resulted in a corneal abrasion.  Though I do not believe at this time that you have a laceration or will need surgery, it is very important that you follow up soon for re-evaluation by a specialist.    Return to the emergency department if you develop new or worsening symptoms that concern you.

## 2019-02-02 ENCOUNTER — Other Ambulatory Visit: Payer: Self-pay

## 2019-02-02 ENCOUNTER — Emergency Department: Payer: Medicare Other

## 2019-02-02 ENCOUNTER — Emergency Department
Admission: EM | Admit: 2019-02-02 | Discharge: 2019-02-02 | Disposition: A | Discharge status: Home / Self Care | Payer: Medicare Other | Attending: Emergency Medicine | Admitting: Emergency Medicine

## 2019-02-02 ENCOUNTER — Encounter: Payer: Self-pay | Admitting: Emergency Medicine

## 2019-02-02 DIAGNOSIS — J45909 Unspecified asthma, uncomplicated: Secondary | ICD-10-CM | POA: Diagnosis not present

## 2019-02-02 DIAGNOSIS — Z79899 Other long term (current) drug therapy: Secondary | ICD-10-CM | POA: Diagnosis not present

## 2019-02-02 DIAGNOSIS — W19XXXA Unspecified fall, initial encounter: Secondary | ICD-10-CM | POA: Insufficient documentation

## 2019-02-02 DIAGNOSIS — Z87891 Personal history of nicotine dependence: Secondary | ICD-10-CM | POA: Diagnosis not present

## 2019-02-02 DIAGNOSIS — Y92009 Unspecified place in unspecified non-institutional (private) residence as the place of occurrence of the external cause: Secondary | ICD-10-CM | POA: Diagnosis not present

## 2019-02-02 DIAGNOSIS — G20 Parkinson's disease: Secondary | ICD-10-CM | POA: Insufficient documentation

## 2019-02-02 DIAGNOSIS — Y999 Unspecified external cause status: Secondary | ICD-10-CM | POA: Diagnosis not present

## 2019-02-02 DIAGNOSIS — S40012A Contusion of left shoulder, initial encounter: Secondary | ICD-10-CM | POA: Diagnosis not present

## 2019-02-02 DIAGNOSIS — S4992XA Unspecified injury of left shoulder and upper arm, initial encounter: Secondary | ICD-10-CM | POA: Diagnosis present

## 2019-02-02 DIAGNOSIS — Y939 Activity, unspecified: Secondary | ICD-10-CM | POA: Insufficient documentation

## 2019-02-02 MED ORDER — HYDROMORPHONE HCL 1 MG/ML IJ SOLN
1.0000 mg | Freq: Once | INTRAMUSCULAR | Status: AC
  Administered 2019-02-02: 1 mg via INTRAMUSCULAR
  Filled 2019-02-02: qty 1

## 2019-02-02 MED ORDER — TRAMADOL HCL 50 MG PO TABS: 50.0000 mg | ORAL_TABLET | Freq: Four times a day (QID) | ORAL | 0 refills | Status: DC | PRN

## 2019-02-02 NOTE — ED Triage Notes (Signed)
Pt states he tripped and fell this afternoon, Pain to left shoulder, he is wearing a sling placed by himself. NAD. Uses a cane to walk.

## 2019-02-02 NOTE — ED Provider Notes (Signed)
[facility] Emergency Department Provider Note   ____________________________________________   First MD Initiated Contact with Patient 02/02/19 1317     (approximate)  I have reviewed the triage vital signs and the nursing notes.   HISTORY  Chief Complaint Fall    HPI Cory [name] is a 63 y.o. male patient presents with left shoulder pain secondary to a fall this afternoon.  Pain see decreased range of motion but denies loss of sensation.  Patient rates his pain as 6/10.  Patient described the pain as "aching".  Patient is incorrectly wearing an arm sling.         Past Medical History:  Diagnosis Date  . Arthritis    hips and back  . Asthma    as a baby-   . GERD (gastroesophageal reflux disease)    rare -   . Parkinson's disease ([place])   . Pneumonia    age 56  . Rash    legs- on going    Patient Active Problem List   Diagnosis Date Noted  . Degenerative spondylolisthesis 05/07/2016    Past Surgical History:  Procedure Laterality Date  . APPENDECTOMY    . BACK SURGERY     posterior lumbar spine fusion L4-5  . COLONOSCOPY    . COLONOSCOPY WITH PROPOFOL N/A 06/06/2017   Procedure: COLONOSCOPY WITH PROPOFOL;  Surgeon: Manya Silvas, MD;  Location: [facility] ENDOSCOPY;  Service: Endoscopy;  Laterality: N/A;  . KNEE ARTHROSCOPY Bilateral    ACL  . KNEE ARTHROSCOPY W/ ACL RECONSTRUCTION    . RADIAL KERATOTOMY    . ROTATOR CUFF REPAIR Bilateral     Prior to Admission medications   Medication Sig Start Date End Date Taking? Authorizing Provider  Amantadine HCl 100 MG tablet Take 100 mg by mouth 3 (three) times daily.    [provider]  carbidopa-levodopa (SINEMET CR) 50-200 MG tablet 1 tablet at bedtime as needed (for tremors.).     [provider]  carbidopa-levodopa (SINEMET IR) 25-100 MG tablet Take 1 tablet by mouth 3 (three) times daily.    [provider]  Cyanocobalamin (VITAMIN B-12 PO) Take 1  tablet by mouth daily.    [provider]  erythromycin ophthalmic ointment Apply a thin strip of ointment (about 1/4") to the lower eyelid of each affected eye every 6 hours x 1 week. 09/05/18   Hinda Kehr, MD  glycopyrrolate (ROBINUL) 1 MG tablet Take 1 mg by mouth daily as needed (for secretions).    [provider]  HYDROcodone-acetaminophen (NORCO) 5-325 MG tablet Take 1 tablet by mouth every 4 (four) hours as needed for moderate pain. 04/08/18   Merlyn Lot, MD  HYDROcodone-acetaminophen (NORCO/VICODIN) 5-325 MG tablet Take 1-2 tablets by mouth every 4 (four) hours as needed for moderate pain.    [provider]  ondansetron (ZOFRAN) 4 MG tablet Take 1 tablet (4 mg total) by mouth daily as needed for nausea or vomiting. 04/08/18 04/08/19  Merlyn Lot, MD  OVER THE COUNTER MEDICATION Take 1 capsule by mouth daily. FOCUS FACTOR NUTRITION FOR THE BRAIN.    [provider]  oxyCODONE-acetaminophen (PERCOCET/ROXICET) 5-325 MG tablet Take 1-2 tablets by mouth every 4 (four) hours as needed for moderate pain. 05/08/16   Consuella Lose, MD  rOPINIRole (REQUIP XL) 2 MG 24 hr tablet Take 2 mg by mouth at bedtime as needed (for tremors).    [provider]  rOPINIRole (REQUIP) 1 MG tablet Take 2 mg  by mouth 3 (three) times daily.     [provider]  tamsulosin (FLOMAX) 0.4 MG CAPS capsule Take 1 capsule (0.4 mg total) by mouth daily after supper. 04/08/18   Merlyn Lot, MD  terbinafine (LAMISIL) 1 % cream Apply 1 application topically every 3 (three) days. For rash.    [provider]  tiZANidine (ZANAFLEX) 4 MG tablet Take 4 mg by mouth every 6 (six) hours as needed for muscle spasms.    [provider]  traMADol (ULTRAM) 50 MG tablet Take 1 tablet (50 mg total) by mouth every 6 (six) hours as needed for moderate pain. 02/02/19   Sable Feil, PA-C    Allergies Patient has no known allergies.  No family history on  file.  Social History Social History   Tobacco Use  . Smoking status: Former Smoker    Packs/day: 3.00    Years: 23.00    Pack years: 69.00    Types: Cigarettes, Cigars    Quit date: 01/03/2010    Years since quitting: 9.0  . Smokeless tobacco: Never Used  . Tobacco comment: smokes 1 cigar per day 04/30/2016  Substance Use Topics  . Alcohol use: Yes    Alcohol/week: 1.0 standard drinks    Types: 1 Shots of liquor per week  . Drug use: No    Review of Systems Constitutional: No fever/chills Eyes: No visual changes. ENT: No sore throat. Cardiovascular: Denies chest pain. Respiratory: Denies shortness of breath. Gastrointestinal: No abdominal pain.  No nausea, no vomiting.  No diarrhea.  No constipation. Genitourinary: Negative for dysuria. Musculoskeletal: Left shoulder pain.   Skin: Negative for rash. Neurological: Negative for headaches, focal weakness or numbness.  History of Parkinson disease.   ____________________________________________   PHYSICAL EXAM:  VITAL SIGNS: ED Triage Vitals  Enc Vitals Group     BP --      Pulse Rate 02/02/19 1310 83     Resp 02/02/19 1310 16     Temp 02/02/19 1310 97.9 F (36.6 C)     Temp Source 02/02/19 1310 Oral     SpO2 02/02/19 1310 100 %     Weight 02/02/19 1311 168 lb (76.2 kg)     Height 02/02/19 1311 5\' 7"  (1.702 m)     Head Circumference --      Peak Flow --      Pain Score 02/02/19 1311 6     Pain Loc --      Pain Edu? --      Excl. in [place]? --    Constitutional: Alert and oriented. Well appearing and in no acute distress. Hematological/Lymphatic/Immunilogical: No cervical lymphadenopathy. Cardiovascular: Normal rate, regular rhythm. Grossly normal heart sounds.  Good peripheral circulation. Respiratory: Normal respiratory effort.  No retractions. Lungs CTAB. Gastrointestinal: Soft and nontender. No distention. No abdominal bruits. No CVA tenderness. Musculoskeletal: No obvious deformity to the left shoulder.   Patient is moderate guarding palpation at the humeral head.  Neurologic:  Normal speech and language. No gross focal neurologic deficits are appreciated. No gait instability. Skin:  Skin is warm, dry and intact. No rash noted. Psychiatric: Mood and affect are normal. Speech and behavior are normal.  ____________________________________________   LABS (all labs ordered are listed, but only abnormal results are displayed)  Labs Reviewed - No data to display ____________________________________________  EKG   ____________________________________________  RADIOLOGY  ED MD interpretation:    Official radiology report(s): Dg Shoulder Left  Result Date: 02/02/2019 CLINICAL DATA:  Pain  secondary to fall EXAM: LEFT SHOULDER - 2+ VIEW COMPARISON:  None. FINDINGS: No fracture or malalignment. Mild AC joint and glenohumeral degenerative changes. Slightly narrowed appearing subacromial space as may be seen with rotator cuff disease. IMPRESSION: 1. No acute osseous abnormality 2. Degenerative changes at the [facility] joint and glenohumeral joint. Electronically Signed   By: Donavan Foil M.D.   On: 02/02/2019 14:01    ____________________________________________   PROCEDURES  Procedure(s) performed (including Critical Care):  Procedures   ____________________________________________   INITIAL IMPRESSION / ASSESSMENT AND PLAN / ED COURSE  As part of my medical decision making, I reviewed the following data within the [place] was evaluated in Emergency Department on 02/02/2019 for the symptoms described in the history of present illness. He was evaluated in the context of the global COVID-19 pandemic, which necessitated consideration that the patient might be at risk for infection with the SARS-CoV-2 virus that causes COVID-19. Institutional protocols and algorithms that pertain to the evaluation of patients at risk for COVID-19 are in a state of  rapid change based on information released by regulatory bodies including the CDC and federal and state organizations. These policies and algorithms were followed during the patient's care in the ED.  Patient presents with left shoulder pain secondary to a fall at home.  Discussed x-ray findings showing only degenerative changes in the shoulder.  Patient arm sling was adjusted for correct usage.  Patient given discharge care instruction advised medication may cause drowsiness.  Follow-up PCP.      ____________________________________________   FINAL CLINICAL IMPRESSION(S) / ED DIAGNOSES  Final diagnoses:  Fall in home, initial encounter  Contusion of left shoulder, initial encounter     ED Discharge Orders         Ordered    traMADol (ULTRAM) 50 MG tablet  Every 6 hours PRN     02/02/19 1411           Note:  This document was prepared using Dragon voice recognition software and may include unintentional dictation errors.    Sable Feil, PA-C 02/02/19 1419    Earleen Newport, MD 02/02/19 1500

## 2019-02-02 NOTE — Discharge Instructions (Addendum)
Follow discharge care instructions and wear sling for 2 to 3 days as needed.  Be advised medication may cause drowsiness.

## 2019-03-14 ENCOUNTER — Other Ambulatory Visit: Payer: Self-pay | Admitting: Orthopedic Surgery

## 2019-03-14 DIAGNOSIS — S46019D Strain of muscle(s) and tendon(s) of the rotator cuff of unspecified shoulder, subsequent encounter: Secondary | ICD-10-CM

## 2019-03-26 ENCOUNTER — Other Ambulatory Visit: Payer: Self-pay

## 2019-03-26 ENCOUNTER — Ambulatory Visit
Admission: RE | Admit: 2019-03-26 | Discharge: 2019-03-26 | Disposition: A | Discharge status: Home / Self Care | Payer: Medicare Other | Source: Ambulatory Visit | Attending: Orthopedic Surgery | Admitting: Orthopedic Surgery

## 2019-03-26 DIAGNOSIS — S46019D Strain of muscle(s) and tendon(s) of the rotator cuff of unspecified shoulder, subsequent encounter: Secondary | ICD-10-CM | POA: Diagnosis not present

## 2019-04-04 ENCOUNTER — Other Ambulatory Visit: Payer: Self-pay | Admitting: Orthopedic Surgery

## 2019-04-10 ENCOUNTER — Encounter
Admission: RE | Admit: 2019-04-10 | Discharge: 2019-04-10 | Disposition: A | Discharge status: Home / Self Care | Payer: Medicare PPO | Source: Ambulatory Visit | Attending: Orthopedic Surgery | Admitting: Orthopedic Surgery

## 2019-04-10 ENCOUNTER — Other Ambulatory Visit: Payer: Self-pay

## 2019-04-10 NOTE — Patient Instructions (Signed)
Your COVID19 swab is scheduled on:  Thursday 04/12/2019 any time 8:00-10:30 am Drive up in front of [facility] and remain in your vehicle.  Your procedure is scheduled on: Monday 04/16/2019 Report to Same Day Surgery 2nd floor [facility] Entrance-take elevator on left to 2nd floor.  Check in with surgery information desk.) To find out your arrival time, call [phone] 1:00-3:00 PM on Friday 04/13/2019  Remember: Instructions that are not followed completely may result in serious medical risk, up to and including death, or upon the discretion of your surgeon and anesthesiologist your surgery may need to be rescheduled.    __x__ 1. Do not eat food (including mints, candies, chewing gum) after midnight the night before your procedure. You may drink clear liquids up to 2 hours before you are scheduled to arrive at the hospital for your procedure.  Do not drink anything within 2 hours of your scheduled arrival to the hospital.  Approved clear liquids:  --Water or Apple juice without pulp  --Clear carbohydrate beverage such as Gatorade or Powerade  --Black Coffee or Clear Tea (No milk, no creamers, do not add anything to the coffee or tea)  Finish your provided Ensure clear carbohydrate drink 2 hours before your scheduled arrival time.    __x__ 2. No Alcohol or smoking for 24 hours before or after surgery.   __x__ 3. Notify your doctor if there is any change in your medical condition (cold, fever, infections).   __x__ 4. On the morning of surgery brush your teeth with toothpaste and water.  You may rinse your mouth with mouthwash if you wish.  Do not swallow any toothpaste or mouthwash.  Please read over the following fact sheets that you were given:   [facility] Preparing for Surgery and/or MRSA Information    __x__ Use provided antibacterial CHG Soap as directed on instruction sheet.   Do not wear jewelry, lotions, powders, deodorant, or perfumes on the day of  surgery.  Do not bring valuables to the hospital.  [facility] is not responsible for any belongings or valuables.   For patients discharged on the day of surgery, you will NOT be permitted to drive yourself home.  You must have a responsible adult with you for 24 hours after surgery.  __x__ Take your normal daily medicines on the day of surgery with a SMALL SIP OF WATER:  1. Amantadine  2. Ropinirole (Requip)  3. Sertraline (Zoloft)  4. Carbidopa-Levodopa (Sinemet)  5. Rasagiline (Azilect) if scheduled surgery time is after noon  __x__ STARTING TODAY UNTIL AFTER SURGERY: DO NOT TAKE ANY Anti-inflammatories such as Advil, Ibuprofen, Motrin, Aleve, Naproxen, Naprosyn, BC/Goodies powders or aspirin products.   __x__ STARTING TODAY UNTIL AFTER SURGERY: DO NOT TAKE ANY over the counter supplements until after surgery.

## 2019-04-11 ENCOUNTER — Other Ambulatory Visit: Payer: Self-pay | Admitting: Internal Medicine

## 2019-04-11 DIAGNOSIS — R911 Solitary pulmonary nodule: Secondary | ICD-10-CM

## 2019-04-12 ENCOUNTER — Other Ambulatory Visit
Admission: RE | Admit: 2019-04-12 | Discharge: 2019-04-12 | Disposition: A | Discharge status: Home / Self Care | Payer: Medicare PPO | Source: Ambulatory Visit | Attending: Orthopedic Surgery | Admitting: Orthopedic Surgery

## 2019-04-12 ENCOUNTER — Other Ambulatory Visit: Payer: Self-pay

## 2019-04-12 DIAGNOSIS — Z20822 Contact with and (suspected) exposure to covid-19: Secondary | ICD-10-CM | POA: Insufficient documentation

## 2019-04-12 DIAGNOSIS — Z01812 Encounter for preprocedural laboratory examination: Secondary | ICD-10-CM | POA: Insufficient documentation

## 2019-04-13 LAB — SARS CORONAVIRUS 2 (TAT 6-24 HRS): SARS Coronavirus 2: NEGATIVE

## 2019-04-16 ENCOUNTER — Encounter: Payer: Self-pay | Admitting: Orthopedic Surgery

## 2019-04-16 ENCOUNTER — Ambulatory Visit
Admission: RE | Admit: 2019-04-16 | Discharge: 2019-04-16 | Disposition: A | Discharge status: Home / Self Care | Payer: Medicare PPO | Source: Ambulatory Visit | Attending: Orthopedic Surgery | Admitting: Orthopedic Surgery

## 2019-04-16 ENCOUNTER — Ambulatory Visit: Payer: Medicare PPO | Admitting: Certified Registered"

## 2019-04-16 ENCOUNTER — Encounter
Admission: RE | Disposition: A | Discharge status: Home / Self Care | Payer: Self-pay | Source: Ambulatory Visit | Attending: Orthopedic Surgery

## 2019-04-16 ENCOUNTER — Ambulatory Visit: Payer: Medicare PPO

## 2019-04-16 ENCOUNTER — Other Ambulatory Visit: Payer: Self-pay

## 2019-04-16 DIAGNOSIS — S46012A Strain of muscle(s) and tendon(s) of the rotator cuff of left shoulder, initial encounter: Secondary | ICD-10-CM | POA: Insufficient documentation

## 2019-04-16 DIAGNOSIS — Z419 Encounter for procedure for purposes other than remedying health state, unspecified: Secondary | ICD-10-CM

## 2019-04-16 DIAGNOSIS — J45909 Unspecified asthma, uncomplicated: Secondary | ICD-10-CM | POA: Diagnosis not present

## 2019-04-16 DIAGNOSIS — Z825 Family history of asthma and other chronic lower respiratory diseases: Secondary | ICD-10-CM | POA: Insufficient documentation

## 2019-04-16 DIAGNOSIS — F17210 Nicotine dependence, cigarettes, uncomplicated: Secondary | ICD-10-CM | POA: Diagnosis not present

## 2019-04-16 DIAGNOSIS — M461 Sacroiliitis, not elsewhere classified: Secondary | ICD-10-CM | POA: Insufficient documentation

## 2019-04-16 DIAGNOSIS — Z79899 Other long term (current) drug therapy: Secondary | ICD-10-CM | POA: Diagnosis not present

## 2019-04-16 DIAGNOSIS — M1990 Unspecified osteoarthritis, unspecified site: Secondary | ICD-10-CM | POA: Diagnosis not present

## 2019-04-16 DIAGNOSIS — G20 Parkinson's disease: Secondary | ICD-10-CM | POA: Diagnosis not present

## 2019-04-16 DIAGNOSIS — Z981 Arthrodesis status: Secondary | ICD-10-CM | POA: Insufficient documentation

## 2019-04-16 DIAGNOSIS — G43909 Migraine, unspecified, not intractable, without status migrainosus: Secondary | ICD-10-CM | POA: Diagnosis not present

## 2019-04-16 DIAGNOSIS — K219 Gastro-esophageal reflux disease without esophagitis: Secondary | ICD-10-CM | POA: Diagnosis not present

## 2019-04-16 DIAGNOSIS — Z809 Family history of malignant neoplasm, unspecified: Secondary | ICD-10-CM | POA: Diagnosis not present

## 2019-04-16 DIAGNOSIS — Z86010 Personal history of colonic polyps: Secondary | ICD-10-CM | POA: Insufficient documentation

## 2019-04-16 DIAGNOSIS — Z8249 Family history of ischemic heart disease and other diseases of the circulatory system: Secondary | ICD-10-CM | POA: Diagnosis not present

## 2019-04-16 DIAGNOSIS — M75102 Unspecified rotator cuff tear or rupture of left shoulder, not specified as traumatic: Secondary | ICD-10-CM | POA: Diagnosis present

## 2019-04-16 DIAGNOSIS — Z801 Family history of malignant neoplasm of trachea, bronchus and lung: Secondary | ICD-10-CM | POA: Diagnosis not present

## 2019-04-16 DIAGNOSIS — Z803 Family history of malignant neoplasm of breast: Secondary | ICD-10-CM | POA: Insufficient documentation

## 2019-04-16 DIAGNOSIS — W010XXA Fall on same level from slipping, tripping and stumbling without subsequent striking against object, initial encounter: Secondary | ICD-10-CM | POA: Insufficient documentation

## 2019-04-16 DIAGNOSIS — N183 Chronic kidney disease, stage 3 unspecified: Secondary | ICD-10-CM | POA: Diagnosis not present

## 2019-04-16 HISTORY — PX: SHOULDER ARTHROSCOPY WITH BICEPS TENDON REPAIR: SHX5674

## 2019-04-16 SURGERY — SHOULDER ARTHROSCOPY WITH BICEPS TENDON REPAIR
Anesthesia: General | Site: Shoulder | Laterality: Left

## 2019-04-16 MED ORDER — FAMOTIDINE 20 MG PO TABS: 20.0000 mg | ORAL_TABLET | Freq: Once | ORAL | Status: AC

## 2019-04-16 MED ORDER — BUPIVACAINE LIPOSOME 1.3 % IJ SUSP
INTRAMUSCULAR | Status: DC | PRN
  Administered 2019-04-16: 7 mg
  Administered 2019-04-16: 13 mg

## 2019-04-16 MED ORDER — PHENYLEPHRINE HCL-NACL 10-0.9 MG/250ML-% IV SOLN
INTRAVENOUS | Status: DC | PRN
  Administered 2019-04-16: 50 ug/min via INTRAVENOUS

## 2019-04-16 MED ORDER — EPINEPHRINE PF 1 MG/ML IJ SOLN
INTRAMUSCULAR | Status: AC
  Filled 2019-04-16: qty 4

## 2019-04-16 MED ORDER — SUGAMMADEX SODIUM 200 MG/2ML IV SOLN
INTRAVENOUS | Status: DC | PRN
  Administered 2019-04-16: 200 mg via INTRAVENOUS

## 2019-04-16 MED ORDER — OXYCODONE HCL 5 MG/5ML PO SOLN: 5.0000 mg | Freq: Once | ORAL | Status: DC | PRN

## 2019-04-16 MED ORDER — LACTATED RINGERS IV SOLN: INTRAVENOUS | Status: DC

## 2019-04-16 MED ORDER — ONDANSETRON HCL 4 MG/2ML IJ SOLN: 4.0000 mg | Freq: Once | INTRAMUSCULAR | Status: DC | PRN

## 2019-04-16 MED ORDER — MIDAZOLAM HCL 2 MG/2ML IJ SOLN: 4.0000 mg | Freq: Once | INTRAMUSCULAR | Status: AC

## 2019-04-16 MED ORDER — LIDOCAINE HCL (CARDIAC) PF 100 MG/5ML IV SOSY
PREFILLED_SYRINGE | INTRAVENOUS | Status: DC | PRN
  Administered 2019-04-16: 40 mg via INTRAVENOUS

## 2019-04-16 MED ORDER — ACETAMINOPHEN 500 MG PO TABS: 1000.0000 mg | ORAL_TABLET | Freq: Three times a day (TID) | ORAL | 2 refills | Status: AC

## 2019-04-16 MED ORDER — OXYCODONE HCL 5 MG PO TABS: 5.0000 mg | ORAL_TABLET | ORAL | 0 refills | Status: AC | PRN

## 2019-04-16 MED ORDER — CEFAZOLIN SODIUM-DEXTROSE 2-4 GM/100ML-% IV SOLN
2.0000 g | INTRAVENOUS | Status: AC
  Administered 2019-04-16: 2 g via INTRAVENOUS

## 2019-04-16 MED ORDER — ACETAMINOPHEN 10 MG/ML IV SOLN: 1000.0000 mg | Freq: Once | INTRAVENOUS | Status: DC | PRN

## 2019-04-16 MED ORDER — FENTANYL CITRATE (PF) 100 MCG/2ML IJ SOLN
INTRAMUSCULAR | Status: AC
  Filled 2019-04-16: qty 2

## 2019-04-16 MED ORDER — CHLORHEXIDINE GLUCONATE 4 % EX LIQD: 60.0000 mL | Freq: Once | CUTANEOUS | Status: DC

## 2019-04-16 MED ORDER — ONDANSETRON 4 MG PO TBDP: 4.0000 mg | ORAL_TABLET | Freq: Three times a day (TID) | ORAL | 0 refills | Status: DC | PRN

## 2019-04-16 MED ORDER — LIDOCAINE-EPINEPHRINE 1 %-1:100000 IJ SOLN
INTRAMUSCULAR | Status: AC
  Filled 2019-04-16: qty 1

## 2019-04-16 MED ORDER — BUPIVACAINE LIPOSOME 1.3 % IJ SUSP
INTRAMUSCULAR | Status: AC
  Filled 2019-04-16: qty 20

## 2019-04-16 MED ORDER — LACTATED RINGERS IV SOLN
INTRAVENOUS | Status: DC | PRN
  Administered 2019-04-16: 4 mL

## 2019-04-16 MED ORDER — ROCURONIUM BROMIDE 100 MG/10ML IV SOLN
INTRAVENOUS | Status: DC | PRN
  Administered 2019-04-16: 10 mg via INTRAVENOUS
  Administered 2019-04-16: 50 mg via INTRAVENOUS

## 2019-04-16 MED ORDER — CEFAZOLIN SODIUM-DEXTROSE 2-4 GM/100ML-% IV SOLN
INTRAVENOUS | Status: AC
  Filled 2019-04-16: qty 100

## 2019-04-16 MED ORDER — FAMOTIDINE 20 MG PO TABS
ORAL_TABLET | ORAL | Status: AC
  Administered 2019-04-16: 20 mg via ORAL
  Filled 2019-04-16: qty 1

## 2019-04-16 MED ORDER — ONDANSETRON HCL 4 MG/2ML IJ SOLN
INTRAMUSCULAR | Status: DC | PRN
  Administered 2019-04-16: 4 mg via INTRAVENOUS

## 2019-04-16 MED ORDER — FENTANYL CITRATE (PF) 100 MCG/2ML IJ SOLN: 25.0000 ug | INTRAMUSCULAR | Status: DC | PRN

## 2019-04-16 MED ORDER — PROPOFOL 10 MG/ML IV BOLUS
INTRAVENOUS | Status: AC
  Filled 2019-04-16: qty 20

## 2019-04-16 MED ORDER — OXYCODONE HCL 5 MG PO TABS: 5.0000 mg | ORAL_TABLET | Freq: Once | ORAL | Status: DC | PRN

## 2019-04-16 MED ORDER — MIDAZOLAM HCL 2 MG/2ML IJ SOLN
INTRAMUSCULAR | Status: AC
  Administered 2019-04-16: 2 mg via INTRAVENOUS
  Filled 2019-04-16: qty 4

## 2019-04-16 MED ORDER — PROPOFOL 10 MG/ML IV BOLUS
INTRAVENOUS | Status: DC | PRN
  Administered 2019-04-16: 150 mg via INTRAVENOUS

## 2019-04-16 MED ORDER — FENTANYL CITRATE (PF) 100 MCG/2ML IJ SOLN
INTRAMUSCULAR | Status: DC | PRN
  Administered 2019-04-16: 100 ug via INTRAVENOUS

## 2019-04-16 MED ORDER — BUPIVACAINE HCL (PF) 0.5 % IJ SOLN
INTRAMUSCULAR | Status: AC
  Filled 2019-04-16: qty 10

## 2019-04-16 MED ORDER — PHENYLEPHRINE HCL (PRESSORS) 10 MG/ML IV SOLN
INTRAVENOUS | Status: DC | PRN
  Administered 2019-04-16: 100 ug via INTRAVENOUS
  Administered 2019-04-16: 50 ug via INTRAVENOUS
  Administered 2019-04-16: 100 ug via INTRAVENOUS
  Administered 2019-04-16: 150 ug via INTRAVENOUS
  Administered 2019-04-16: 100 ug via INTRAVENOUS
  Administered 2019-04-16: 50 ug via INTRAVENOUS
  Administered 2019-04-16 (×2): 100 ug via INTRAVENOUS

## 2019-04-16 MED ORDER — BUPIVACAINE HCL (PF) 0.5 % IJ SOLN
INTRAMUSCULAR | Status: DC | PRN
  Administered 2019-04-16: 7 mg via PERINEURAL
  Administered 2019-04-16: 3 mg via PERINEURAL

## 2019-04-16 MED ORDER — LIDOCAINE HCL (PF) 1 % IJ SOLN
INTRAMUSCULAR | Status: AC
  Filled 2019-04-16: qty 30

## 2019-04-16 MED ORDER — ASPIRIN EC 325 MG PO TBEC: 325.0000 mg | DELAYED_RELEASE_TABLET | Freq: Every day | ORAL | 0 refills | Status: AC

## 2019-04-16 SURGICAL SUPPLY — 68 items
ADAPTER IRRIG TUBE 2 SPIKE SOL (ADAPTER) ×4 IMPLANT
ANCHOR 2.3 SP SGL 1.2 XBRAID (Anchor) ×4 IMPLANT
ANCHOR SUT 6.5 PEEK EYELET (Anchor) ×2 IMPLANT
BUR RADIUS 4.0X18.5 (BURR) ×2 IMPLANT
BUR RADIUS 5.5 (BURR) ×2 IMPLANT
CANNULA PART THRD DISP 5.75X7 (CANNULA) ×4 IMPLANT
CANNULA PARTIAL THREAD 2X7 (CANNULA) ×2 IMPLANT
CANNULA TWIST IN 8.25X9CM (CANNULA) IMPLANT
CHLORAPREP W/TINT 26 (MISCELLANEOUS) ×2 IMPLANT
COOLER POLAR GLACIER W/PUMP (MISCELLANEOUS) ×2 IMPLANT
DRAPE 3/4 80X56 (DRAPES) ×2 IMPLANT
DRAPE IMP U-DRAPE 54X76 (DRAPES) ×4 IMPLANT
DRAPE INCISE IOBAN 66X45 STRL (DRAPES) ×2 IMPLANT
DRAPE SPLIT 6X30 W/TAPE (DRAPES) ×4 IMPLANT
DRSG TEGADERM 4X4.75 (GAUZE/BANDAGES/DRESSINGS) ×2 IMPLANT
ELECT REM PT RETURN 9FT ADLT (ELECTROSURGICAL)
ELECTRODE REM PT RTRN 9FT ADLT (ELECTROSURGICAL) IMPLANT
GAUZE SPONGE 4X4 12PLY STRL (GAUZE/BANDAGES/DRESSINGS) ×2 IMPLANT
GAUZE XEROFORM 1X8 LF (GAUZE/BANDAGES/DRESSINGS) ×2 IMPLANT
GLOVE BIOGEL PI IND STRL 8 (GLOVE) ×1 IMPLANT
GLOVE BIOGEL PI INDICATOR 8 (GLOVE) ×1
GLOVE SURG SYN 7.5  E (GLOVE) ×1
GLOVE SURG SYN 7.5 E (GLOVE) ×1 IMPLANT
GOWN STRL REUS W/ TWL LRG LVL3 (GOWN DISPOSABLE) ×1 IMPLANT
GOWN STRL REUS W/TWL LRG LVL3 (GOWN DISPOSABLE) ×1
GOWN STRL REUS W/TWL LRG LVL4 (GOWN DISPOSABLE) ×2 IMPLANT
IV LACTATED RINGER IRRG 3000ML (IV SOLUTION) ×8
IV LR IRRIG 3000ML ARTHROMATIC (IV SOLUTION) ×8 IMPLANT
KIT STABILIZATION SHOULDER (MISCELLANEOUS) ×2 IMPLANT
KIT SUTURETAK 3.0 INSERT PERC (KITS) IMPLANT
KIT TURNOVER KIT A (KITS) ×2 IMPLANT
MANIFOLD NEPTUNE II (INSTRUMENTS) ×2 IMPLANT
MASK FACE SPIDER DISP (MASK) ×2 IMPLANT
MAT ABSORB  FLUID 56X50 GRAY (MISCELLANEOUS) ×2
MAT ABSORB FLUID 56X50 GRAY (MISCELLANEOUS) ×2 IMPLANT
NDL SAFETY ECLIPSE 18X1.5 (NEEDLE) ×1 IMPLANT
NEEDLE HYPO 18GX1.5 SHARP (NEEDLE) ×1
NEEDLE HYPO 22GX1.5 SAFETY (NEEDLE) ×2 IMPLANT
NEEDLE SCORPION MULTI FIRE (NEEDLE) ×2 IMPLANT
PACK ARTHROSCOPY SHOULDER (MISCELLANEOUS) ×2 IMPLANT
PAD ABD DERMACEA PRESS 5X9 (GAUZE/BANDAGES/DRESSINGS) ×2 IMPLANT
PAD WRAPON POLAR SHDR XLG (MISCELLANEOUS) ×1 IMPLANT
PROBE MEASUREMENT AR SCOPE 60D (MISCELLANEOUS) ×2 IMPLANT
SET TUBE SUCT SHAVER OUTFL 24K (TUBING) ×2 IMPLANT
SET TUBE TIP INTRA-ARTICULAR (MISCELLANEOUS) ×2 IMPLANT
SLING ULTRA II M (MISCELLANEOUS) IMPLANT
STAPLER SKIN PROX 35W (STAPLE) ×2 IMPLANT
STRIP CLOSURE SKIN 1/2X4 (GAUZE/BANDAGES/DRESSINGS) ×2 IMPLANT
SUT ETHILON 3-0 FS-10 30 BLK (SUTURE) ×2
SUT ETHILON 4-0 (SUTURE)
SUT ETHILON 4-0 FS2 18XMFL BLK (SUTURE)
SUT LASSO 90 DEG SD STR (SUTURE) IMPLANT
SUT MNCRL 4-0 (SUTURE)
SUT MNCRL 4-0 27XMFL (SUTURE)
SUT VIC AB 0 CT1 36 (SUTURE) ×2 IMPLANT
SUT VIC AB 2-0 CT2 27 (SUTURE) ×2 IMPLANT
SUTURE EHLN 3-0 FS-10 30 BLK (SUTURE) ×1 IMPLANT
SUTURE ETHLN 4-0 FS2 18XMF BLK (SUTURE) IMPLANT
SUTURE MNCRL 4-0 27XMF (SUTURE) IMPLANT
SYR 10ML LL (SYRINGE) ×2 IMPLANT
SYSTEM ANCHOR SUT KNTLESS 4.75 (Anchor) ×4 IMPLANT
SYSTEM FBRTK BICEPS 1.9 DRILL (Anchor) ×4 IMPLANT
TAPE MICROFOAM 4IN (TAPE) ×2 IMPLANT
TISSUE ARTHOFLEX THICK 3MM (Tissue) ×2 IMPLANT
TUBING ARTHRO INFLOW-ONLY STRL (TUBING) ×2 IMPLANT
TUBING CONNECTING 10 (TUBING) ×2 IMPLANT
WAND WEREWOLF FLOW 90D (MISCELLANEOUS) IMPLANT
WRAPON POLAR PAD SHDR XLG (MISCELLANEOUS) ×2

## 2019-04-16 NOTE — Anesthesia Procedure Notes (Signed)
Anesthesia Regional Block: Interscalene brachial plexus block   Pre-Anesthetic Checklist: ,, timeout performed, Correct Patient, Correct Site, Correct Laterality, Correct Procedure, Correct Position, site marked, Risks and benefits discussed,  Surgical consent,  Pre-op evaluation,  At surgeon's request and post-op pain management  Laterality: Upper and Left  Prep: chloraprep       Needles:  Injection technique: Single-shot  Needle Type: Stimiplex     Needle Length: 5cm  Needle Gauge: 22     Additional Needles:   Procedures:,,,, ultrasound used (permanent image in chart),,,,  Narrative:  Start time: 04/16/2019 11:06 AM End time: 04/16/2019 11:02 AM Injection made incrementally with aspirations every 5 mL.  Performed by: Personally  Anesthesiologist: Jorryn Hershberger, Precious Haws, MD  Additional Notes: Patient consented for risk and benefits of nerve block including but not limited to nerve damage, failed block, bleeding and infection.  Patient voiced understanding.  Functioning IV was confirmed and monitors were applied.  A 42mm 22ga Stimuplex needle was used. Sterile prep,hand hygiene and sterile gloves were used.  Minimal sedation used for procedure.  No paresthesia endorsed by patient during the procedure.  Negative aspiration and negative test dose prior to incremental administration of local anesthetic. The patient tolerated the procedure well with no immediate complications.

## 2019-04-16 NOTE — Op Note (Signed)
SURGERY DATE: 04/16/2019    PRE-OP DIAGNOSIS:  1. 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 Left mini-open rotator cuff repair and reconstruction using allograft patch 2. Left arthroscopic extensive debridement of shoulder (glenohumeral and subacromial spaces)   SURGEON: Cato Mulligan, MD   ASSISTANT: Anitra Lauth, PA   ANESTHESIA: Gen with Exparil interscalene block    ESTIMATED BLOOD LOSS: 25cc   DRAINS:  none   TOTAL IV FLUIDS: per anesthesia      SPECIMENS: none   IMPLANTS:    - Iconix SPEED double loaded with 1.2 and 2.58mm tape x 2 - Stryker Omega Knotless Anchor System, 4.68mm x2 & 6.72mm x1 - Arthrex FiberTak Suture Anchor - Double Loaded x 1   OPERATIVE FINDINGS:  Examination under anesthesia: A careful examination under anesthesia was performed.  Passive range of motion was: FF: 150; ER at side: 40; ER in abduction: 90; IR in abduction: 50.  Anterior load shift: NT.  Posterior load shift: NT.  Sulcus in neutral: NT.  Sulcus in ER: NT.     Intra-operative findings: A thorough arthroscopic examination of the shoulder was performed.  The findings are: 1. Biceps tendon:  Not visualized intra-articularly 2. Superior labrum: injected with surrounding synovitis 3. Posterior labrum and capsule: normal 4. Inferior capsule and inferior recess: normal 5. Glenoid cartilage surface: Focal area of grade 1-2 degenerative changes 6. Supraspinatus attachment: full-thickness tear with retraction to the glenoid 7. Posterior rotator cuff attachment:  Full-thickness tear of the anterior supraspinatus with retraction to the glenoid 8. Humeral head articular cartilage: Focal area of grade 4 degenerative change 9. Rotator interval: Severe synovitis 10: Subscapularis tendon:  Attachment intact on the lesser tuberosity 11. Anterior labrum: degenerative 12.  IGHL: significant synovitis around IGHL   OPERATIVE REPORT:    Indications for procedure: Cory [name] is a 64 y.o. male with a history of prior rotator cuff repair in 1998.  He did well after that procedure until he had a fall at home on 02/02/2019.  He underwent nonoperative management the.  Clinical exam and MRI were suggestive of large rotator cuff tear. There was significant atrophy of the supraspinatus on MRI.  Given these findings, we decided to proceed with surgical management. After discussion of risks, benefits, and alternatives to surgery, the patient elected to proceed.    Procedure in detail:   I identified Cory [name] in the pre-operative holding area.  I marked the operative shoulder with my initials. I reviewed the risks and benefits of the proposed surgical intervention, and the patient (and/or patient's guardian) wished to proceed.  Anesthesia was then performed with an Exparil interscalene block.  The patient was transferred to the operative suite and placed in the beach chair position.     SCDs were placed on the lower extremities. Appropriate IV antibiotics were administered prior to incision. The operative upper extremity was then prepped and draped in standard fashion. A time out was performed confirming the correct extremity, correct patient, and correct procedure.    I then created a standard posterior portal with an 11 blade. The glenohumeral joint was easily entered with a blunt trochar and the arthroscope introduced. The findings of diagnostic arthroscopy are described above.  A standard anterior portal was made.  I debrided degenerative tissue including the synovitic tissue about the rotator interval and IGHL as well as the anterior and superior  labrum.  There was a significant amount of synovitis, more so than usual.  I then coagulated the inflamed synovium in these regions to obtain hemostasis and reduce the risk of post-operative swelling using an Arthrocare  radiofrequency device.    Next, the arthroscope was then introduced into the subacromial space.    A direct lateral portal was created with an 11-blade after spinal needle localization. An extensive subacromial bursectomy was performed using a combination of the shaver and Arthrocare wand.  The rotator cuff was mobilized by releasing both inferiorly and superiorly using an ArthroCare wand.  There was a large tear involving all of the supraspinatus and the anterior infraspinatus in a U-shaped fashion.  A could be mobilized past the articular margin centrally and almost completely covered the footprint anteriorly and posteriorly.  A longitudinal incision from the anterolateral acromion ~6cm in length was made overlying the raphe between the anterior and middle heads of the deltoid. The raphe was identified and it was incised. The subacromial space was identified. Any remaining bursa was excised.    The arm was then internally rotated.  The rotator cuff footprint was cleared of soft tissue. A rongeur was used to gently decorticate the rotator cuff footprint to allow for improved healing. Two Iconix SPEED anchors were placed just lateral to the articular margin, one anteriorly and 1 posteriorly.  A third medial row anchor was placed in between the SPEED anchors.  This was done using an Arthrex double loaded fiber tak.  The rotator cuff was mobilized further using key elevators both superior and inferior to the tear.  All 12 strands of suture from the medial row anchors were passed through the rotator cuff in an appropriate fashion.   One set of sutures from each anchor was tied using a knot pusher to help reduce the rotator cuff to its footprint.  The anterior and posterior tied sutures were then cut.  Next, the area of the rotator cuff footprint that was uncovered was measured.  The ArthroFlex 46mm graft was cut appropriately.  The remainder of the 8 suture strands.  Were passed in an appropriate fashion through  the graft using combination of a scorpion suture passer and a free needle.  he graft was then shuttled down each set of sutures was tied with a knot pusher.  This allowed the graft to be approximated to the rotator cuff medially.  Two Omega anchors were placed for the lateral row anchors with one limb of each of the medial row sutures passed through each anchor.  This allowed the graft to be compressed onto the bare footprint laterally. This construct was stable with external and internal rotation.   The wound was thoroughly irrigated.  The deltoid split was closed with 0 Vicryl.  The subdermal layer was closed with 2-0 Vicryl.  The skin was closed with staples. The portals were closed with 3-0 Nylon. Xeroform was applied to the incisions. A sterile dressing was applied, followed by a Polar Care sleeve and a SlingShot shoulder immobilizer/sling. The patient was awakened from anesthesia without difficulty and was transferred to the PACU in stable condition.    Of note, assistance from a PA was essential to performing the surgery.  PA was present for the entire surgery.  PA assisted with patient positioning, retraction, instrumentation, and wound closure. The surgery would have been more difficult and had longer operative time without PA assistance.     COMPLICATIONS: none   DISPOSITION: plan for discharge home after recovery in  PACU    POSTOPERATIVE PLAN: Remain in sling (except hygiene and elbow/wrist/hand RoM exercises as instructed by PT) x 6 weeks and NWB for this time. PT to begin 3-4 days after surgery.  Use massive rotator cuff repair/superior capsular reconstruction rehab protocol.  ASA 325mg  daily x 2 weeks for DVT ppx.

## 2019-04-16 NOTE — H&P (Signed)
Paper H&P to be scanned into permanent record. H&P reviewed. No significant changes noted.  

## 2019-04-16 NOTE — Anesthesia Preprocedure Evaluation (Signed)
Anesthesia Evaluation  Patient identified by MRN, date of birth, ID band Patient awake    Reviewed: Allergy & Precautions, NPO status , Patient's Chart, lab work & pertinent test results  History of Anesthesia Complications Negative for: history of anesthetic complications  Airway Mallampati: II  TM Distance: >3 FB Neck ROM: Full    Dental no notable dental hx. (+) Teeth Intact, Dental Advisory Given   Pulmonary asthma , neg sleep apnea, neg COPD, Current Smoker and Patient abstained from smoking.,  Hospitalized for asthma as child. Not needed inhalers in a year. Patient smokes cigarettes ocassionaly, last cigarette yesterday   Pulmonary exam normal breath sounds clear to auscultation       Cardiovascular Exercise Tolerance: Good METS(-) hypertension(-) CAD and (-) Past MI negative cardio ROS  (-) dysrhythmias  Rhythm:Regular Rate:Normal - Systolic murmurs    Neuro/Psych Parkinson's disease  Neuromuscular disease negative psych ROS   GI/Hepatic GERD  ,(+)     (-) substance abuse  ,   Endo/Other  neg diabetes  Renal/GU negative Renal ROS     Musculoskeletal  (+) Arthritis , Osteoarthritis,    Abdominal   Peds  Hematology   Anesthesia Other Findings Past Medical History: No date: Arthritis     Comment:  hips and back No date: Asthma     Comment:  as a baby-  No date: GERD (gastroesophageal reflux disease)     Comment:  rare -  No date: Parkinson's disease (HCC) No date: Pneumonia     Comment:  age 57 No date: Rash     Comment:  legs- on going  Reproductive/Obstetrics                             Anesthesia Physical Anesthesia Plan  ASA: II  Anesthesia Plan: General   Post-op Pain Management: GA combined w/ Regional for post-op pain   Induction: Intravenous  PONV Risk Score and Plan: 1 and Ondansetron, Dexamethasone and Midazolam  Airway Management Planned: Oral  ETT  Additional Equipment: None  Intra-op Plan:   Post-operative Plan: Extubation in OR  Informed Consent: I have reviewed the patients History and Physical, chart, labs and discussed the procedure including the risks, benefits and alternatives for the proposed anesthesia with the patient or authorized representative who has indicated his/her understanding and acceptance.     Dental advisory given  Plan Discussed with: CRNA and Surgeon  Anesthesia Plan Comments: (Discussed risks of anesthesia with patient, including PONV, sore throat, lip/dental damage. Rare risks discussed as well, such as cardiorespiratory sequelae. Patient understands.  Discussed r/b/a of interscalene block, including elective nature. Risks discussed: - Rare: bleeding, infection, nerve damage - shortness of breath from hemidiaphragmatic paralysis - unilateral horner's syndrome - poor/non-working blocks Patient understands and agrees. )        Anesthesia Quick Evaluation

## 2019-04-16 NOTE — Discharge Instructions (Signed)
AMBULATORY SURGERY  DISCHARGE INSTRUCTIONS   1) The drugs that you were given will stay in your system until tomorrow so for the next 24 hours you should not:  A) Drive an automobile B) Make any legal decisions C) Drink any alcoholic beverage   2) You may resume regular meals tomorrow.  Today it is better to start with liquids and gradually work up to solid foods.  You may eat anything you prefer, but it is better to start with liquids, then soup and crackers, and gradually work up to solid foods.   3) Please notify your doctor immediately if you have any unusual bleeding, trouble breathing, redness and pain at the surgery site, drainage, fever, or pain not relieved by medication.    4) Additional Instructions:        Please contact your physician with any problems or Same Day Surgery at [phone], Monday through Friday 6 am to 4 pm, or [place] at [facility] number at [phone].Post-Op Instructions - Rotator Cuff Repair  1. Bracing: You will wear a shoulder immobilizer or sling for 6 weeks.   2. Driving: No driving for 3 weeks post-op. When driving, do not wear the immobilizer. Ideally, we recommend no driving for 6 weeks while sling is in place as one arm will be immobilized.   3. Activity: No active lifting for 2 months. Wrist, hand, and elbow motion only. Avoid lifting the upper arm away from the body except for hygiene. You are permitted to bend and straighten the elbow passively only (no active elbow motion). You may use your hand and wrist for typing, writing, and managing utensils (cutting food). Do not lift more than a coffee cup for 8 weeks.  When sleeping or resting, inclined positions (recliner chair or wedge pillow) and a pillow under the forearm for support may provide better comfort for up to 4 weeks.  Avoid long distance travel for 4 weeks.  Return to normal activities after rotator cuff repair repair normally takes 6 months on average. If rehab goes  very well, may be able to do most activities at 4 months, except overhead or contact sports.  4. Physical Therapy: Begins 3-4 days after surgery, and proceed 1 time per week for the first 6 weeks, then 1-2 times per week from weeks 6-20 post-op.  5. Medications:  - You will be provided a prescription for narcotic pain medicine. After surgery, take 1-2 narcotic tablets every 4 hours if needed for severe pain.  - A prescription for anti-nausea medication will be provided in case the narcotic medicine causes nausea - take 1 tablet every 6 hours only if nauseated.   - Take tylenol 1000 mg (2 Extra Strength tablets or 3 regular strength) every 8 hours for pain.  May decrease or stop tylenol 5 days after surgery if you are having minimal pain. - Take ASA 325mg /day x 2 weeks to help prevent DVTs/PEs (blood clots).  - DO NOT take ANY nonsteroidal anti-inflammatory pain medications (Advil, Motrin, Ibuprofen, Aleve, Naproxen, or Naprosyn). These medicines can inhibit healing of your shoulder repair.    If you are taking prescription medication for anxiety, depression, insomnia, muscle spasm, chronic pain, or for attention deficit disorder, you are advised that you are at a higher risk of adverse effects with use of narcotics post-op, including narcotic addiction/dependence, depressed breathing, death. If you use non-prescribed substances: alcohol, marijuana, cocaine, heroin, methamphetamines, etc., you are at a higher risk of adverse effects with use of narcotics post-op, including  narcotic addiction/dependence, depressed breathing, death. You are advised that taking > 50 morphine milligram equivalents (MME) of narcotic pain medication per day results in twice the risk of overdose or death. For your prescription provided: oxycodone 5 mg - taking more than 6 tablets per day would result in > 50 morphine milligram equivalents (MME) of narcotic pain medication. Be advised that we will prescribe narcotics  short-term, for acute post-operative pain only - 3 weeks for major operations such as shoulder repair/reconstruction surgeries.     6. Post-Op Appointment:  Your first post-op appointment will be 10-14 days post-op.  7. Work or School: For most, but not all procedures, we advise staying out of work or school for at least 1 to 2 weeks in order to recover from the stress of surgery and to allow time for healing.   If you need a work or school note this can be provided.   8. Smoking: If you are a smoker, you need to refrain from smoking in the postoperative period. The nicotine in cigarettes will inhibit healing of your shoulder repair and decrease the chance of successful repair. Similarly, nicotine containing products (gum, patches) should be avoided.   Post-operative Brace: Apply and remove the brace you received as you were instructed to at the time of fitting and as described in detail as the brace's instructions for use indicate.  Wear the brace for the period of time prescribed by your physician.  The brace can be cleaned with soap and water and allowed to air dry only.  Should the brace result in increased pain, decreased feeling (numbness/tingling), increased swelling or an overall worsening of your medical condition, please contact your doctor immediately.  If an emergency situation occurs as a result of wearing the brace after normal business hours, please dial 911 and seek immediate medical attention.  Let your doctor know if you have any further questions about the brace issued to you. Refer to the shoulder sling instructions for use if you have any questions regarding the correct fit of your shoulder sling.  [facility] for Troubleshooting: [phone]  Video that illustrates how to properly use a shoulder sling: "Instructions for Proper Use of an Orthopaedic Sling" [email or web address]

## 2019-04-16 NOTE — Anesthesia Procedure Notes (Signed)
Procedure Name: Intubation Date/Time: 04/16/2019 11:34 AM Performed by: Nolon Lennert, RN Pre-anesthesia Checklist: Patient identified, Patient being monitored, Timeout performed, Emergency Drugs available and Suction available Patient Re-evaluated:Patient Re-evaluated prior to induction Oxygen Delivery Method: Circle system utilized Preoxygenation: Pre-oxygenation with 100% oxygen Induction Type: IV induction Ventilation: Mask ventilation without difficulty Laryngoscope Size: Mac and 4 Grade View: Grade II Tube type: Oral Tube size: 7.5 mm Number of attempts: 1 Airway Equipment and Method: Stylet Placement Confirmation: ETT inserted through vocal cords under direct vision,  positive ETCO2 and breath sounds checked- equal and bilateral Secured at: 21 cm Tube secured with: Tape Dental Injury: Teeth and Oropharynx as per pre-operative assessment

## 2019-04-16 NOTE — Transfer of Care (Signed)
Immediate Anesthesia Transfer of Care Note  Patient: Cory [name] [facility]  Procedure(s) Performed: SHOULDER ARTHROSCOPY WITH BICEPS TENDON REPAIR, MINI OPEN SUPERIOR CAPSULAR RECONSTRUCTION, BICEPS TENODESIS (Left Shoulder)  Patient Location: PACU  Anesthesia Type:General and Regional  Level of Consciousness: sedated  Airway & Oxygen Therapy: Patient Spontanous Breathing and Patient connected to face mask oxygen  Post-op Assessment: Report given to RN  Post vital signs: stable  Last Vitals:  Vitals Value Taken Time  BP 124/79 04/16/19 1449  Temp    Pulse 72 04/16/19 1451  Resp 19 04/16/19 1451  SpO2 100 % 04/16/19 1451  Vitals shown include unvalidated device data.  Last Pain:  Vitals:   04/16/19 1115  TempSrc:   PainSc: 0-No pain         Complications: No apparent anesthesia complications

## 2019-04-17 NOTE — Anesthesia Postprocedure Evaluation (Signed)
Anesthesia Post Note  Patient: Cory [name]  Procedure(s) Performed: SHOULDER ARTHROSCOPY WITH BICEPS TENDON REPAIR, MINI OPEN SUPERIOR CAPSULAR RECONSTRUCTION, BICEPS TENODESIS (Left Shoulder)  Patient location during evaluation: PACU Anesthesia Type: General Level of consciousness: awake and alert Pain management: pain level controlled Vital Signs Assessment: post-procedure vital signs reviewed and stable Respiratory status: spontaneous breathing, nonlabored ventilation, respiratory function stable and patient connected to nasal cannula oxygen Cardiovascular status: blood pressure returned to baseline and stable Postop Assessment: no apparent nausea or vomiting Anesthetic complications: no     Last Vitals:  Vitals:   04/16/19 1555 04/16/19 1710  BP: (!) 155/83 (!) 150/70  Pulse: 62 62  Resp: 18 20  Temp: (!) 36.4 C   SpO2: 99% 99%    Last Pain:  Vitals:   04/16/19 1555  TempSrc: Temporal  PainSc: 0-No pain                 Arita Miss

## 2019-04-23 ENCOUNTER — Ambulatory Visit: Payer: Medicare PPO | Attending: Internal Medicine

## 2019-05-21 ENCOUNTER — Ambulatory Visit
Admission: RE | Admit: 2019-05-21 | Discharge: 2019-05-21 | Disposition: A | Discharge status: Home / Self Care | Payer: Medicare PPO | Source: Ambulatory Visit | Attending: Internal Medicine | Admitting: Internal Medicine

## 2019-05-21 ENCOUNTER — Other Ambulatory Visit: Payer: Self-pay

## 2019-05-21 DIAGNOSIS — R911 Solitary pulmonary nodule: Secondary | ICD-10-CM | POA: Diagnosis present

## 2019-05-28 DIAGNOSIS — M1732 Unilateral post-traumatic osteoarthritis, left knee: Secondary | ICD-10-CM | POA: Insufficient documentation

## 2019-05-31 DIAGNOSIS — J479 Bronchiectasis, uncomplicated: Secondary | ICD-10-CM | POA: Insufficient documentation

## 2019-05-31 DIAGNOSIS — I2510 Atherosclerotic heart disease of native coronary artery without angina pectoris: Secondary | ICD-10-CM | POA: Insufficient documentation

## 2019-06-29 ENCOUNTER — Other Ambulatory Visit: Payer: Self-pay

## 2019-06-29 ENCOUNTER — Emergency Department
Admission: EM | Admit: 2019-06-29 | Discharge: 2019-06-29 | Disposition: A | Discharge status: Home / Self Care | Payer: Medicare PPO | Attending: Student | Admitting: Student

## 2019-06-29 ENCOUNTER — Emergency Department: Payer: Medicare PPO

## 2019-06-29 DIAGNOSIS — G20 Parkinson's disease: Secondary | ICD-10-CM | POA: Diagnosis not present

## 2019-06-29 DIAGNOSIS — R031 Nonspecific low blood-pressure reading: Secondary | ICD-10-CM | POA: Diagnosis not present

## 2019-06-29 DIAGNOSIS — Z87891 Personal history of nicotine dependence: Secondary | ICD-10-CM | POA: Diagnosis not present

## 2019-06-29 DIAGNOSIS — I959 Hypotension, unspecified: Secondary | ICD-10-CM | POA: Diagnosis present

## 2019-06-29 DIAGNOSIS — Z79899 Other long term (current) drug therapy: Secondary | ICD-10-CM | POA: Insufficient documentation

## 2019-06-29 LAB — URINALYSIS, COMPLETE (UACMP) WITH MICROSCOPIC
Bacteria, UA: NONE SEEN
Bilirubin Urine: NEGATIVE
Glucose, UA: NEGATIVE mg/dL
Hgb urine dipstick: NEGATIVE
Ketones, ur: 5 mg/dL — AB
Leukocytes,Ua: NEGATIVE
Nitrite: NEGATIVE
Protein, ur: 100 mg/dL — AB
Specific Gravity, Urine: 1.018 (ref 1.005–1.030)
pH: 6 (ref 5.0–8.0)

## 2019-06-29 LAB — CBC WITH DIFFERENTIAL/PLATELET
Abs Immature Granulocytes: 0.04 10*3/uL (ref 0.00–0.07)
Basophils Absolute: 0.1 10*3/uL (ref 0.0–0.1)
Basophils Relative: 1 %
Eosinophils Absolute: 1.7 10*3/uL — ABNORMAL HIGH (ref 0.0–0.5)
Eosinophils Relative: 17 %
HCT: 45.2 % (ref 39.0–52.0)
Hemoglobin: 14.6 g/dL (ref 13.0–17.0)
Immature Granulocytes: 0 %
Lymphocytes Relative: 16 %
Lymphs Abs: 1.6 10*3/uL (ref 0.7–4.0)
MCH: 28.7 pg (ref 26.0–34.0)
MCHC: 32.3 g/dL (ref 30.0–36.0)
MCV: 89 fL (ref 80.0–100.0)
Monocytes Absolute: 0.8 10*3/uL (ref 0.1–1.0)
Monocytes Relative: 8 %
Neutro Abs: 6 10*3/uL (ref 1.7–7.7)
Neutrophils Relative %: 58 %
Platelets: 218 10*3/uL (ref 150–400)
RBC: 5.08 MIL/uL (ref 4.22–5.81)
RDW: 14 % (ref 11.5–15.5)
WBC: 10.3 10*3/uL (ref 4.0–10.5)
nRBC: 0 % (ref 0.0–0.2)

## 2019-06-29 LAB — TROPONIN I (HIGH SENSITIVITY)
Troponin I (High Sensitivity): <2 ng/L (ref <18)
Troponin I (High Sensitivity): 3 ng/L (ref <18)

## 2019-06-29 LAB — COMPREHENSIVE METABOLIC PANEL
ALT: 7 U/L (ref 0–44)
AST: 18 U/L (ref 15–41)
Albumin: 3.8 g/dL (ref 3.5–5.0)
Alkaline Phosphatase: 81 U/L (ref 38–126)
Anion gap: 8 (ref 5–15)
BUN: 19 mg/dL (ref 8–23)
CO2: 28 mmol/L (ref 22–32)
Calcium: 9.2 mg/dL (ref 8.9–10.3)
Chloride: 102 mmol/L (ref 98–111)
Creatinine, Ser: 1.47 mg/dL — ABNORMAL HIGH (ref 0.61–1.24)
GFR calc Af Amer: 58 mL/min — ABNORMAL LOW (ref >60)
GFR calc non Af Amer: 50 mL/min — ABNORMAL LOW (ref >60)
Glucose, Bld: 86 mg/dL (ref 70–99)
Potassium: 4.4 mmol/L (ref 3.5–5.1)
Sodium: 138 mmol/L (ref 135–145)
Total Bilirubin: 0.6 mg/dL (ref 0.3–1.2)
Total Protein: 6.7 g/dL (ref 6.5–8.1)

## 2019-06-29 LAB — PROCALCITONIN: Procalcitonin: <0.1 ng/mL

## 2019-06-29 MED ORDER — SODIUM CHLORIDE 0.9 % IV BOLUS
1000.0000 mL | Freq: Once | INTRAVENOUS | Status: AC
  Administered 2019-06-29: 1000 mL via INTRAVENOUS

## 2019-06-29 NOTE — ED Triage Notes (Signed)
Pt c/o having low b/p this morning 40/20's, states he was started on new meds to help raise b/p on Tuesday. Think it is related to parkinson's and meds that he is on. States it has been intermittent. Worse when he stands .on Midodrine 2.5mg  BID

## 2019-06-29 NOTE — ED Notes (Signed)
Pt signed discharge paperwork and paperwork sent to records.

## 2019-06-29 NOTE — Discharge Instructions (Signed)
Thank you for letting us take care of you in the emergency department today.   Please continue to take any regular, prescribed medications.   Please follow up with: Your Neurology doctor to review your ER visit and follow up on your symptoms.    Please return to the ER for any new or worsening symptoms.

## 2019-06-29 NOTE — ED Provider Notes (Addendum)
[facility] Emergency Department Provider Note  ____________________________________________   First MD Initiated Contact with Patient 06/29/19 1110     (approximate)  I have reviewed the triage vital signs and the nursing notes.  History  Chief Complaint Hypotension    HPI Cory [name] is a 64 y.o. male with hx of Parkinson's, low blood pressure, recently started on midodrine who presents for low blood pressure. Patient states this AM he felt lightheaded/weak and was "seeing spots", all symptoms consistent with when he has low blood pressure.  Symptoms worse when standing.  Checked his BP and noted to be 40s/20s. Had just taken his AM midodrine a few minutes prior. Denies any LOC or syncope. No chest pain or shortness of breath.  No nausea, vomiting, diarrhea, fevers, recent illnesses.  Has received both of his COVID vaccines.  Denies any hematuria, hematochezia, or melena.  Does report decreased fluid intake, as recommended by his MD.  He typically stops drinking liquids after around 5 PM due to urinary frequency and bothersome nocturia.  BP on arrival 91/61.   Past Medical Hx Past Medical History:  Diagnosis Date  . Arthritis    hips and back  . Asthma    as a baby-   . GERD (gastroesophageal reflux disease)    rare -   . Parkinson's disease ([place])   . Pneumonia    age 75  . Rash    legs- on going    Problem List Patient Active Problem List   Diagnosis Date Noted  . Degenerative spondylolisthesis 05/07/2016    Past Surgical Hx Past Surgical History:  Procedure Laterality Date  . APPENDECTOMY    . BACK SURGERY     posterior lumbar spine fusion L4-5  . COLONOSCOPY    . COLONOSCOPY WITH PROPOFOL N/A 06/06/2017   Procedure: COLONOSCOPY WITH PROPOFOL;  Surgeon: Manya Silvas, MD;  Location: [facility] ENDOSCOPY;  Service: Endoscopy;  Laterality: N/A;  . KNEE ARTHROSCOPY Bilateral    ACL  . KNEE ARTHROSCOPY W/ ACL RECONSTRUCTION    . RADIAL  KERATOTOMY    . ROTATOR CUFF REPAIR Bilateral   . SHOULDER ARTHROSCOPY WITH BICEPS TENDON REPAIR Left 04/16/2019   Procedure: SHOULDER ARTHROSCOPY WITH BICEPS TENDON REPAIR, MINI OPEN SUPERIOR CAPSULAR RECONSTRUCTION, BICEPS TENODESIS;  Surgeon: Leim Fabry, MD;  Location: [facility] ORS;  Service: Orthopedics;  Laterality: Left;    Medications Prior to Admission medications   Medication Sig Start Date End Date Taking? Authorizing Provider  acetaminophen (TYLENOL) 500 MG tablet Take 2 tablets (1,000 mg total) by mouth every 8 (eight) hours. 04/16/19 04/15/20  Leim Fabry, MD  Amantadine HCl 100 MG tablet Take 100 mg by mouth 3 (three) times daily.    [provider]  carbidopa-levodopa (SINEMET CR) 50-200 MG tablet Take 1 tablet by mouth at bedtime.     [provider]  carbidopa-levodopa (SINEMET IR) 25-100 MG tablet Take 2 tablets by mouth 3 (three) times daily.     [provider]  erythromycin ophthalmic ointment Apply a thin strip of ointment (about 1/4") to the lower eyelid of each affected eye every 6 hours x 1 week. Patient not taking: Reported on 04/05/2019 09/05/18   Hinda Kehr, MD  ondansetron (ZOFRAN ODT) 4 MG disintegrating tablet Take 1 tablet (4 mg total) by mouth every 8 (eight) hours as needed for nausea or vomiting. 04/16/19   Leim Fabry, MD  oxyCODONE (ROXICODONE) 5 MG immediate release tablet Take 1-2 tablets (5-10 mg total)  by mouth every 4 (four) hours as needed (pain). 04/16/19 04/15/20  Leim Fabry, MD  rasagiline (AZILECT) 1 MG TABS tablet Take 1 mg by mouth daily. 02/22/19   [provider]  rOPINIRole (REQUIP XL) 2 MG 24 hr tablet Take 2 mg by mouth at bedtime.     [provider]  rOPINIRole (REQUIP) 1 MG tablet Take 2 mg by mouth 3 (three) times daily.     [provider]  sertraline (ZOLOFT) 25 MG tablet Take 25 mg by mouth daily.    [provider]  tamsulosin (FLOMAX) 0.4 MG CAPS capsule Take 1 capsule (0.4  mg total) by mouth daily after supper. Patient taking differently: Take 0.8 mg by mouth daily after supper.  04/08/18   Merlyn Lot, MD  tiZANidine (ZANAFLEX) 4 MG tablet Take 4 mg by mouth daily as needed for muscle spasms.     [provider]    Allergies Patient has no known allergies.  Family Hx No family history on file.  Social Hx Social History   Tobacco Use  . Smoking status: Former Smoker    Packs/day: 0.25    Years: 23.00    Pack years: 5.75    Types: Cigarettes, Cigars  . Smokeless tobacco: Never Used  . Tobacco comment: smokes 1 cigar per day 04/30/2016  Substance Use Topics  . Alcohol use: Yes    Alcohol/week: 1.0 standard drinks    Types: 1 Shots of liquor per week  . Drug use: No     Review of Systems  Constitutional: Negative for fever. Negative for chills.  Positive for lightheadedness, low blood pressure. Eyes: Negative for visual changes. ENT: Negative for sore throat. Cardiovascular: Negative for chest pain. Respiratory: Negative for shortness of breath. Gastrointestinal: Negative for nausea. Negative for vomiting.  Genitourinary: Negative for dysuria. Musculoskeletal: Negative for leg swelling. Skin: Negative for rash. Neurological: Negative for headaches.   Physical Exam  Vital Signs: ED Triage Vitals  Enc Vitals Group     BP 06/29/19 1048 91/61     Pulse Rate 06/29/19 1048 87     Resp 06/29/19 1048 16     Temp --      Temp Source 06/29/19 1048 Oral     SpO2 06/29/19 1048 100 %     Weight 06/29/19 1050 160 lb (72.6 kg)     Height 06/29/19 1050 5\' 8"  (1.727 m)     Head Circumference --      Peak Flow --      Pain Score 06/29/19 1050 0     Pain Loc --      Pain Edu? --      Excl. in [place]? --     Constitutional: Alert and oriented. Well appearing.  Wife at bedside. Head: Normocephalic. Atraumatic. Eyes: Conjunctivae clear, sclera anicteric. Pupils equal and symmetric. Nose: No masses or lesions. No congestion or  rhinorrhea. Mouth/Throat: Wearing mask.  Neck: No stridor. Trachea midline.  Cardiovascular: Normal rate, regular rhythm. Extremities well perfused. Respiratory: Normal respiratory effort.  Lungs CTAB. Gastrointestinal: Soft. Non-distended. Non-tender.  Genitourinary: Deferred. Musculoskeletal: No lower extremity edema. No deformities. Neurologic:  Normal speech and language.  Mild rhythmic tremoring of his head consistent with history of Parkinson's. No gross focal or lateralizing neurologic deficits are appreciated.  Skin: Skin is warm, dry and intact. No rash noted. Psychiatric: Mood and affect are appropriate for situation.  EKG  Personally reviewed and interpreted by myself.   Rate: 70 Rhythm: sinus Axis: normal  Intervals: WNL Minimal ST change inferior, maybe 1 mm, no reciprocal changes No STEMI    Radiology  CXR  IMPRESSION:  1. No acute intrathoracic process.     Procedures  Procedure(s) performed (including critical care):  Procedures   Initial Impression / Assessment and Plan / MDM / ED Course  64 y.o. male who presents to the ED for low blood pressure.  History of Parkinson's and known low blood pressure on midodrine.  Ddx: autonomic dysregulation in the setting of Parkinson's, hypovolemia, dehydration in the setting of decreased fluid intake (stops drinking fluids after dinner due to night time urinary frequency), anemia.  No infectious signs or symptoms based on history or exam.  Will plan for labs, fluids, EKG, XR.  Took his scheduled midodrine prior to arrival, blood pressure already improving to this to 120s/90s.  Labs notable for creatinine 1.47 from normal previously, and small ketones in urine - suspect element of dehydration contributing, receiving 2 L IVF.  Otherwise, remainder of labs without actionable derangements.  No anemia.  Procalcitonin negative, XR negative, urine negative, therefore doubt infectious etiology.  Troponin x 2 negative.    BP remains stable after fluids. As such, patient stable for discharge with outpatient follow-up.  Patient comfortable with the plan.  Given return precautions.   _______________________________   As part of my medical decision making I have reviewed available labs, radiology tests, reviewed old records, obtained additional history from family (wife at bedside).  Final Clinical Impression(s) / ED Diagnosis  Final diagnoses:  Low blood pressure reading       Note:  This document was prepared using Dragon voice recognition software and may include unintentional dictation errors.     Lilia Pro., MD 06/29/19 [phone]

## 2019-06-29 NOTE — ED Notes (Signed)
Pt states having low blood pressures at home and that he is now on a medicaiton for it. Pt states seeing stars and feeling dizzy/lightheaded when his blood pressure is low.

## 2019-08-01 ENCOUNTER — Other Ambulatory Visit: Payer: Self-pay | Admitting: Orthopedic Surgery

## 2019-08-01 DIAGNOSIS — Z9889 Other specified postprocedural states: Secondary | ICD-10-CM

## 2019-08-01 DIAGNOSIS — M25511 Pain in right shoulder: Secondary | ICD-10-CM

## 2019-08-01 DIAGNOSIS — S46001A Unspecified injury of muscle(s) and tendon(s) of the rotator cuff of right shoulder, initial encounter: Secondary | ICD-10-CM

## 2019-08-03 ENCOUNTER — Ambulatory Visit
Admission: RE | Admit: 2019-08-03 | Discharge: 2019-08-03 | Disposition: A | Discharge status: Home / Self Care | Payer: Medicare PPO | Source: Ambulatory Visit | Attending: Orthopedic Surgery | Admitting: Orthopedic Surgery

## 2019-08-03 ENCOUNTER — Other Ambulatory Visit: Payer: Self-pay

## 2019-08-03 DIAGNOSIS — Z9889 Other specified postprocedural states: Secondary | ICD-10-CM | POA: Diagnosis present

## 2019-08-03 DIAGNOSIS — S46001A Unspecified injury of muscle(s) and tendon(s) of the rotator cuff of right shoulder, initial encounter: Secondary | ICD-10-CM | POA: Diagnosis present

## 2019-08-03 DIAGNOSIS — M25511 Pain in right shoulder: Secondary | ICD-10-CM | POA: Diagnosis present

## 2019-10-18 DIAGNOSIS — I700 Atherosclerosis of aorta: Secondary | ICD-10-CM | POA: Insufficient documentation

## 2020-03-10 ENCOUNTER — Other Ambulatory Visit: Payer: Self-pay | Admitting: Pulmonary Disease

## 2020-03-10 DIAGNOSIS — R918 Other nonspecific abnormal finding of lung field: Secondary | ICD-10-CM

## 2020-03-31 ENCOUNTER — Other Ambulatory Visit: Payer: Self-pay | Admitting: Internal Medicine

## 2020-03-31 ENCOUNTER — Other Ambulatory Visit (HOSPITAL_COMMUNITY): Payer: Self-pay | Admitting: Internal Medicine

## 2020-03-31 DIAGNOSIS — R6889 Other general symptoms and signs: Secondary | ICD-10-CM | POA: Insufficient documentation

## 2020-04-25 ENCOUNTER — Encounter: Payer: Self-pay | Admitting: Radiology

## 2020-04-25 ENCOUNTER — Emergency Department: Payer: Medicare PPO

## 2020-04-25 ENCOUNTER — Emergency Department
Admission: EM | Admit: 2020-04-25 | Discharge: 2020-04-25 | Disposition: A | Discharge status: Home / Self Care | Payer: Medicare PPO | Attending: Emergency Medicine | Admitting: Emergency Medicine

## 2020-04-25 ENCOUNTER — Other Ambulatory Visit: Payer: Self-pay

## 2020-04-25 DIAGNOSIS — R531 Weakness: Secondary | ICD-10-CM

## 2020-04-25 DIAGNOSIS — G43409 Hemiplegic migraine, not intractable, without status migrainosus: Secondary | ICD-10-CM

## 2020-04-25 DIAGNOSIS — R202 Paresthesia of skin: Secondary | ICD-10-CM | POA: Diagnosis present

## 2020-04-25 DIAGNOSIS — Z7951 Long term (current) use of inhaled steroids: Secondary | ICD-10-CM | POA: Insufficient documentation

## 2020-04-25 DIAGNOSIS — Z87891 Personal history of nicotine dependence: Secondary | ICD-10-CM | POA: Diagnosis not present

## 2020-04-25 DIAGNOSIS — G43109 Migraine with aura, not intractable, without status migrainosus: Secondary | ICD-10-CM | POA: Diagnosis not present

## 2020-04-25 DIAGNOSIS — G20 Parkinson's disease: Secondary | ICD-10-CM | POA: Diagnosis not present

## 2020-04-25 DIAGNOSIS — Z79899 Other long term (current) drug therapy: Secondary | ICD-10-CM | POA: Diagnosis not present

## 2020-04-25 DIAGNOSIS — I6502 Occlusion and stenosis of left vertebral artery: Secondary | ICD-10-CM | POA: Diagnosis not present

## 2020-04-25 DIAGNOSIS — I6782 Cerebral ischemia: Secondary | ICD-10-CM | POA: Insufficient documentation

## 2020-04-25 DIAGNOSIS — R4701 Aphasia: Secondary | ICD-10-CM | POA: Diagnosis not present

## 2020-04-25 DIAGNOSIS — J45909 Unspecified asthma, uncomplicated: Secondary | ICD-10-CM | POA: Diagnosis not present

## 2020-04-25 LAB — DIFFERENTIAL
Abs Immature Granulocytes: 0.04 10*3/uL (ref 0.00–0.07)
Basophils Absolute: 0.1 10*3/uL (ref 0.0–0.1)
Basophils Relative: 1 %
Eosinophils Absolute: 2.8 10*3/uL — ABNORMAL HIGH (ref 0.0–0.5)
Eosinophils Relative: 24 %
Immature Granulocytes: 0 %
Lymphocytes Relative: 15 %
Lymphs Abs: 1.7 10*3/uL (ref 0.7–4.0)
Monocytes Absolute: 1 10*3/uL (ref 0.1–1.0)
Monocytes Relative: 9 %
Neutro Abs: 5.8 10*3/uL (ref 1.7–7.7)
Neutrophils Relative %: 51 %
Smear Review: NORMAL

## 2020-04-25 LAB — CBC
HCT: 42.2 % (ref 39.0–52.0)
Hemoglobin: 13.8 g/dL (ref 13.0–17.0)
MCH: 29 pg (ref 26.0–34.0)
MCHC: 32.7 g/dL (ref 30.0–36.0)
MCV: 88.7 fL (ref 80.0–100.0)
Platelets: 243 10*3/uL (ref 150–400)
RBC: 4.76 MIL/uL (ref 4.22–5.81)
RDW: 13.1 % (ref 11.5–15.5)
WBC: 11.5 10*3/uL — ABNORMAL HIGH (ref 4.0–10.5)
nRBC: 0 % (ref 0.0–0.2)

## 2020-04-25 LAB — COMPREHENSIVE METABOLIC PANEL
ALT: <5 U/L (ref 0–44)
AST: 13 U/L — ABNORMAL LOW (ref 15–41)
Albumin: 4 g/dL (ref 3.5–5.0)
Alkaline Phosphatase: 85 U/L (ref 38–126)
Anion gap: 11 (ref 5–15)
BUN: 22 mg/dL (ref 8–23)
CO2: 26 mmol/L (ref 22–32)
Calcium: 9.1 mg/dL (ref 8.9–10.3)
Chloride: 102 mmol/L (ref 98–111)
Creatinine, Ser: 1.49 mg/dL — ABNORMAL HIGH (ref 0.61–1.24)
GFR, Estimated: 52 mL/min — ABNORMAL LOW (ref >60)
Glucose, Bld: 112 mg/dL — ABNORMAL HIGH (ref 70–99)
Potassium: 4.2 mmol/L (ref 3.5–5.1)
Sodium: 139 mmol/L (ref 135–145)
Total Bilirubin: 0.9 mg/dL (ref 0.3–1.2)
Total Protein: 7.4 g/dL (ref 6.5–8.1)

## 2020-04-25 LAB — APTT: aPTT: 44 seconds — ABNORMAL HIGH (ref 24–36)

## 2020-04-25 LAB — PATHOLOGIST SMEAR REVIEW

## 2020-04-25 LAB — PROTIME-INR
INR: 1.6 — ABNORMAL HIGH (ref 0.8–1.2)
Prothrombin Time: 18.5 seconds — ABNORMAL HIGH (ref 11.4–15.2)

## 2020-04-25 LAB — ETHANOL: Alcohol, Ethyl (B): <10 mg/dL (ref <10)

## 2020-04-25 LAB — CBG MONITORING, ED: Glucose-Capillary: 93 mg/dL (ref 70–99)

## 2020-04-25 MED ORDER — ASPIRIN-ACETAMINOPHEN-CAFFEINE 250-250-65 MG PO TABS
1.0000 | ORAL_TABLET | Freq: Once | ORAL | Status: AC
  Administered 2020-04-25: 1 via ORAL
  Filled 2020-04-25: qty 1

## 2020-04-25 MED ORDER — MAGNESIUM SULFATE 2 GM/50ML IV SOLN
2.0000 g | Freq: Once | INTRAVENOUS | Status: AC
  Administered 2020-04-25: 2 g via INTRAVENOUS
  Filled 2020-04-25: qty 50

## 2020-04-25 MED ORDER — SODIUM CHLORIDE 0.9 % IV BOLUS
1000.0000 mL | Freq: Once | INTRAVENOUS | Status: AC
  Administered 2020-04-25: 1000 mL via INTRAVENOUS

## 2020-04-25 MED ORDER — LORAZEPAM 2 MG/ML IJ SOLN
1.0000 mg | INTRAMUSCULAR | Status: DC | PRN
  Administered 2020-04-25: 1 mg via INTRAVENOUS
  Filled 2020-04-25: qty 1

## 2020-04-25 MED ORDER — LABETALOL HCL 5 MG/ML IV SOLN: 20.0000 mg | Freq: Once | INTRAVENOUS | Status: DC

## 2020-04-25 MED ORDER — IOHEXOL 350 MG/ML SOLN
100.0000 mL | Freq: Once | INTRAVENOUS | Status: AC | PRN
  Administered 2020-04-25: 75 mL via INTRAVENOUS

## 2020-04-25 NOTE — ED Notes (Signed)
During triage, pt was aphasic during [place] RN's assessment. In CT, pt's symptoms resolved and stroke scale was a 0. Will continue to monitor

## 2020-04-25 NOTE — Progress Notes (Signed)
CODE STROKE- PHARMACY COMMUNICATION ° ° °Time CODE STROKE called/page received:1220 ° °Time response to CODE STROKE was made (in person):  1225 @ CT scanner ° °Time Stroke Kit retrieved from [facility] (only if needed): Kit retrieved, but pt symptoms fluctuating during assessment. MRI did not show acute stroke. No tPA given. ° °Name of Provider/Nurse contacted:Olivia, RN ° °Past Medical History:  °Diagnosis Date  °• Arthritis   ° hips and back  °• Asthma   ° as a baby-   °• GERD (gastroesophageal reflux disease)   ° rare -   °• Parkinson's disease ([facility])   °• Pneumonia   ° age 2  °• Rash   ° legs- on going  ° °Prior to Admission medications   °Medication Sig Start Date End Date Taking? Authorizing Provider  °Amantadine HCl 100 MG tablet Take 100 mg by mouth 3 (three) times daily.   Yes [provider]  °azelastine (ASTELIN) 0.1 % nasal spray Place into both nostrils. 03/27/20  Yes [provider]  °carbidopa-levodopa (SINEMET CR) 50-200 MG tablet Take 1 tablet by mouth at bedtime.   Yes [provider]  °carbidopa-levodopa (SINEMET IR) 25-100 MG tablet Take 2 tablets by mouth 3 (three) times daily.    Yes [provider]  °entacapone (COMTAN) 200 MG tablet Take 200 mg by mouth 3 (three) times daily. 04/17/20  Yes [provider]  °fludrocortisone (FLORINEF) 0.1 MG tablet Take 100 mcg by mouth every evening. 04/01/20  Yes [provider]  °Fluticasone-Umeclidin-Vilant (TRELEGY ELLIPTA) 100-62.5-25 MCG/INH AEPB Inhale 1 puff into the lungs daily. 06/15/19  Yes [provider]  °midodrine (PROAMATINE) 2.5 MG tablet Take 2.5-5 mg by mouth See admin instructions. Take 5 mg at breakfast, 2.5 mg at lunch, 2.5 mg at supper 01/28/20  Yes [provider]  °MYRBETRIQ 50 MG TB24 tablet Take 50 mg by mouth daily. 03/06/20  Yes [provider]  °sertraline (ZOLOFT) 50 MG tablet Take 50 mg by mouth daily.   Yes [provider]  °tamsulosin (FLOMAX)  0.4 MG CAPS capsule Take 1 capsule (0.4 mg total) by mouth daily after supper. 04/08/18  Yes Robinson, Patrick, MD  °tiZANidine (ZANAFLEX) 4 MG tablet Take 4 mg by mouth daily as needed for muscle spasms.    Yes [provider]  °erythromycin ophthalmic ointment Apply a thin strip of ointment (about 1/4") to the lower eyelid of each affected eye every 6 hours x 1 week. °Patient not taking: No sig reported 09/05/18 04/25/20  Forbach, Cory, MD  °ondansetron (ZOFRAN ODT) 4 MG disintegrating tablet Take 1 tablet (4 mg total) by mouth every 8 (eight) hours as needed for nausea or vomiting. °Patient not taking: Reported on 04/25/2020 04/16/19 04/25/20  Patel, Sunny, MD  °rasagiline (AZILECT) 1 MG TABS tablet Take 1 mg by mouth daily. °Patient not taking: Reported on 04/25/2020 02/22/19 04/25/20  [provider]  °rOPINIRole (REQUIP XL) 2 MG 24 hr tablet Take 2 mg by mouth at bedtime.  °Patient not taking: Reported on 04/25/2020  04/25/20  [provider]  °rOPINIRole (REQUIP) 1 MG tablet Take 2 mg by mouth 3 (three) times daily.  °Patient not taking: Reported on 04/25/2020  04/25/20  [provider]  ° ° ° D  ,PharmD °Clinical Pharmacist  °04/25/2020  2:31 PM ° ° °

## 2020-04-25 NOTE — ED Provider Notes (Signed)
[facility] Emergency Department Provider Note   ____________________________________________   Event Date/Time   First MD Initiated Contact with Patient 04/25/20 1214     (approximate)  I have reviewed the triage vital signs and the nursing notes.   HISTORY  Chief Complaint Numbness    HPI Cory [name] is a 65 y.o. Cory with a stated past medical history of Parkinson's disease and asthma who presents for right-sided weakness and aphasia.  Patient's last known well 1115 this morning.  Patient states the symptoms have been intermittent since this time this morning and they have mostly resolved upon arrival via EMS.  Patient denies any other complaints at this time.  Patient denies any similar symptoms in the past.  Patient currently denies any vision changes, tinnitus, facial droop, sore throat, chest pain, shortness of breath, abdominal pain, nausea/vomiting/diarrhea, dysuria, or numbness/paresthesias in any extremity         Past Medical History:  Diagnosis Date  . Arthritis    hips and back  . Asthma    as a baby-   . GERD (gastroesophageal reflux disease)    rare -   . Parkinson's disease ([place])   . Pneumonia    age 7  . Rash    legs- on going    Patient Active Problem List   Diagnosis Date Noted  . Degenerative spondylolisthesis 05/07/2016    Past Surgical History:  Procedure Laterality Date  . APPENDECTOMY    . BACK SURGERY     posterior lumbar spine fusion L4-5  . COLONOSCOPY    . COLONOSCOPY WITH PROPOFOL N/A 06/06/2017   Procedure: COLONOSCOPY WITH PROPOFOL;  Surgeon: Manya Silvas, MD;  Location: [facility] ENDOSCOPY;  Service: Endoscopy;  Laterality: N/A;  . KNEE ARTHROSCOPY Bilateral    ACL  . KNEE ARTHROSCOPY W/ ACL RECONSTRUCTION    . RADIAL KERATOTOMY    . ROTATOR CUFF REPAIR Bilateral   . SHOULDER ARTHROSCOPY WITH BICEPS TENDON REPAIR Left 04/16/2019   Procedure: SHOULDER ARTHROSCOPY WITH BICEPS TENDON REPAIR, MINI  OPEN SUPERIOR CAPSULAR RECONSTRUCTION, BICEPS TENODESIS;  Surgeon: Leim Fabry, MD;  Location: [facility] ORS;  Service: Orthopedics;  Laterality: Left;    Prior to Admission medications   Medication Sig Start Date End Date Taking? Authorizing Provider  Amantadine HCl 100 MG tablet Take 100 mg by mouth 3 (three) times daily.   Yes [provider]  azelastine (ASTELIN) 0.1 % nasal spray Place into both nostrils. 03/27/20  Yes [provider]  carbidopa-levodopa (SINEMET CR) 50-200 MG tablet Take 1 tablet by mouth at bedtime.   Yes [provider]  carbidopa-levodopa (SINEMET IR) 25-100 MG tablet Take 2 tablets by mouth 3 (three) times daily.    Yes [provider]  entacapone (COMTAN) 200 MG tablet Take 200 mg by mouth 3 (three) times daily. 04/17/20  Yes [provider]  fludrocortisone (FLORINEF) 0.1 MG tablet Take 100 mcg by mouth every evening. 04/01/20  Yes [provider]  Fluticasone-Umeclidin-Vilant (TRELEGY ELLIPTA) 100-62.5-25 MCG/INH AEPB Inhale 1 puff into the lungs daily. 06/15/19  Yes [provider]  midodrine (PROAMATINE) 2.5 MG tablet Take 2.5-5 mg by mouth See admin instructions. Take 5 mg at breakfast, 2.5 mg at lunch, 2.5 mg at supper 01/28/20  Yes [provider]  MYRBETRIQ 50 MG TB24 tablet Take 50 mg by mouth daily. 03/06/20  Yes [provider]  sertraline (ZOLOFT) 50 MG tablet Take 50 mg by mouth daily.   Yes [provider]  tamsulosin (FLOMAX) 0.4 MG CAPS capsule Take 1 capsule (0.4 mg total) by mouth daily after supper. 04/08/18  Yes Merlyn Lot, MD  tiZANidine (ZANAFLEX) 4 MG tablet Take 4 mg by mouth daily as needed for muscle spasms.    Yes [provider]    Allergies Patient has no known allergies.  History reviewed. No pertinent family history.  Social History Social History   Tobacco Use  . Smoking status: Former Smoker    Packs/day: 0.25    Years: 23.00     Pack years: 5.75    Types: Cigarettes, Cigars  . Smokeless tobacco: Never Used  . Tobacco comment: smokes 1 cigar per day 04/30/2016  Substance Use Topics  . Alcohol use: Yes    Alcohol/week: 1.0 standard drink    Types: 1 Shots of liquor per week  . Drug use: No    Review of Systems Constitutional: No fever/chills Eyes: No visual changes. ENT: No sore throat. Cardiovascular: Denies chest pain. Respiratory: Denies shortness of breath. Gastrointestinal: No abdominal pain.  No nausea, no vomiting.  No diarrhea. Genitourinary: Negative for dysuria. Musculoskeletal: Negative for acute arthralgias Skin: Negative for rash. Neurological: Positive for aphasia and right-sided numbness.  Negative for headaches, numbness/paresthesias in any extremity Psychiatric: Negative for suicidal ideation/homicidal ideation   ____________________________________________   PHYSICAL EXAM:  VITAL SIGNS: ED Triage Vitals  Enc Vitals Group     BP 04/25/20 1230 (!) 149/99     Pulse Rate 04/25/20 1218 80     Resp 04/25/20 1218 17     Temp --      Temp src --      SpO2 04/25/20 1218 99 %     Weight 04/25/20 1256 154 lb 1.6 oz (69.9 kg)     Height --      Head Circumference --      Peak Flow --      Pain Score 04/25/20 1249 0     Pain Loc --      Pain Edu? --      Excl. in [place]? --    Constitutional: Alert and oriented. Well appearing and in no acute distress. Eyes: Conjunctivae are normal. PERRL. Head: Atraumatic. Nose: No congestion/rhinnorhea. Mouth/Throat: Mucous membranes are moist. Neck: No stridor Cardiovascular: Grossly normal heart sounds.  Good peripheral circulation. Respiratory: Normal respiratory effort.  No retractions. Gastrointestinal: Soft and nontender. No distention. Musculoskeletal: No obvious deformities Neurologic:  Normal speech and language. No gross focal neurologic deficits are appreciated.  NIHSS 0 Skin:  Skin is warm and dry. No rash noted. Psychiatric: Mood and  affect are normal. Speech and behavior are normal.  ____________________________________________   LABS (all labs ordered are listed, but only abnormal results are displayed)  Labs Reviewed  PROTIME-INR - Abnormal; Notable for the following components:      Result Value   Prothrombin Time 18.5 (*)    INR 1.6 (*)    All other components within normal limits  APTT - Abnormal; Notable for the following components:   aPTT 44 (*)    All other components within normal limits  CBC - Abnormal; Notable for the following components:   WBC 11.5 (*)    All other components within normal limits  DIFFERENTIAL - Abnormal; Notable for the following components:   Eosinophils Absolute 2.8 (*)    All other components within normal limits  COMPREHENSIVE METABOLIC PANEL - Abnormal; Notable for the following components:   Glucose, Bld 112 (*)  Creatinine, Ser 1.49 (*)    AST 13 (*)    GFR, Estimated 52 (*)    All other components within normal limits  ETHANOL  URINE DRUG SCREEN, QUALITATIVE ([facility] ONLY)  URINALYSIS, ROUTINE W REFLEX MICROSCOPIC  PATHOLOGIST SMEAR REVIEW  CBG MONITORING, ED  I-STAT CREATININE, ED   ____________________________________________  EKG  ED ECG REPORT I, Naaman Plummer, the attending physician, personally viewed and interpreted this ECG.  Date: 04/25/2020 EKG Time: 1357 Rate: 71 Rhythm: normal sinus rhythm QRS Axis: normal Intervals: normal ST/T Wave abnormalities: normal Narrative Interpretation: no evidence of acute ischemia  ____________________________________________  RADIOLOGY  ED MD interpretation: CT without contrast of the head shows an aspects score of 3  CT angiography of the head and neck  Official radiology report(s): MR BRAIN WO CONTRAST  Result Date: 04/25/2020 CLINICAL DATA:  Neuro deficit, acute, stroke suspected. Additional history provided: Right-sided numbness, last known well 11:15 this morning, history of Parkinson's, currently  having difficulty with words. EXAM: MRI HEAD WITHOUT CONTRAST TECHNIQUE: Multiplanar, multiecho pulse sequences of the brain and surrounding structures were obtained without intravenous contrast. COMPARISON:  Noncontrast head CT and CT angiogram head/neck performed earlier today 04/25/2020. Brain MRI 10/21/2017. FINDINGS: Brain: At the provider's request, only axial and coronal diffusion-weighted sequences and an axial T2/FLAIR sequence were acquired. Cerebral volume is normal for age. Mild-to-moderate multifocal T2/FLAIR hyperintensity within the cerebral white matter is nonspecific, but compatible with chronic small vessel ischemic disease. Findings have progressed as compared to the brain MRI of 10/21/2017. To a lesser degree, chronic small vessel ischemic changes are also present within the pons. There is no acute infarct. No evidence of intracranial mass or extra-axial fluid collection on the acquired sequences. No midline shift. Vascular: Poorly assessed on the acquired sequences. Skull and upper cervical spine: No focal suspicious osseous lesion identified on the acquired sequences. Sinuses/Orbits: No evidence of acute orbital abnormality. Mild paranasal sinus mucosal thickening, most notably ethmoidal. IMPRESSION: Examination consisting of only diffusion-weighted and T2/FLAIR sequences (at the ordering provider's request). No evidence of acute infarction. Mild-to-moderate chronic small vessel ischemic disease, progressed as compared to the MRI of 10/21/2017. Mild paranasal sinus mucosal thickening, most notably ethmoidal. Electronically Signed   By: Kellie Simmering DO   On: 04/25/2020 13:55   CT HEAD CODE STROKE WO CONTRAST  Result Date: 04/25/2020 CLINICAL DATA:  Code stroke. Slurred speech, mild headache, last known well 11:15 AM. EXAM: CT HEAD WITHOUT CONTRAST TECHNIQUE: Contiguous axial images were obtained from the base of the skull through the vertex without intravenous contrast. COMPARISON:  Brain  MRI 10/21/2017. FINDINGS: Brain: Mild cerebral atrophy. Mild ill-defined hypoattenuation within the cerebral white matter is nonspecific, but compatible with chronic small vessel ischemic disease. There is no acute intracranial hemorrhage. No demarcated cortical infarct. No extra-axial fluid collection. No evidence of intracranial mass. No midline shift. Vascular: No definite hyperdense vessel. Atherosclerotic calcifications Skull: Normal. Negative for fracture or focal lesion. Sinuses/Orbits: Visualized orbits show no acute finding. Mild bilateral ethmoid sinus mucosal thickening at the imaged levels. Other: Trace fluid within right mastoid air cells. ASPECTS ([place] Stroke Program Early CT Score) - Ganglionic level infarction (caudate, lentiform nuclei, internal capsule, insula, M1-M3 cortex): 7 - Supraganglionic infarction (M4-M6 cortex): 3 Total score (0-10 with 10 being normal): 10 These results were called by telephone at the time of interpretation on 04/25/2020 at 12:46 pm to provider [facility] , who verbally acknowledged these results. IMPRESSION: No evidence of acute infarction or acute intracranial  hemorrhage. ASPECTS is 10. Mild cerebral atrophy and chronic small vessel ischemic disease. Electronically Signed   By: Kellie Simmering DO   On: 04/25/2020 12:47   CT ANGIO HEAD CODE STROKE  Result Date: 04/25/2020 CLINICAL DATA:  Stroke/TIA, assess extracranial arteries, assess intracranial arteries. EXAM: CT ANGIOGRAPHY HEAD AND NECK TECHNIQUE: Multidetector CT imaging of the head and neck was performed using the standard protocol during bolus administration of intravenous contrast. Multiplanar CT image reconstructions and MIPs were obtained to evaluate the vascular anatomy. Carotid stenosis measurements (when applicable) are obtained utilizing NASCET criteria, using the distal internal carotid diameter as the denominator. CONTRAST:  66mL OMNIPAQUE IOHEXOL 350 MG/ML SOLN COMPARISON:  Concurrently performed  noncontrast head CT. FINDINGS: CTA NECK FINDINGS Aortic arch: Standard aortic branching. Minimal calcified plaque within the aortic arch. No hemodynamically significant innominate or proximal subclavian artery stenosis. Right carotid system: CCA and ICA patent within the neck without stenosis. Mild atherosclerotic plaque within the carotid bifurcation and proximal ICA as well as distal cervical ICA. Left carotid system: CCA and ICA patent within the neck without stenosis. Mild atherosclerotic plaque within the carotid bifurcation and proximal ICA. Vertebral arteries: Patent within the neck bilaterally. Mild atherosclerotic narrowing at the origin of the dominant left vertebral artery. Skeleton: No acute bony abnormality or aggressive osseous lesion. Cervical spondylosis with multilevel disc space narrowing, disc bulges, posterior disc osteophytes, uncovertebral hypertrophy and facet arthrosis. Other neck: Enlarged thyroid gland. 9 mm left thyroid lobe nodule not meeting consensus criteria for ultrasound follow-up. No neck mass or cervical lymphadenopathy. Upper chest: No consolidation within the imaged lung apices. Review of the MIP images confirms the above findings CTA HEAD FINDINGS Anterior circulation: The intracranial internal carotid arteries are patent. Calcified plaque within both vessels with no more than mild stenosis. The M1 middle cerebral arteries are patent. No M2 proximal branch occlusion or high-grade proximal stenosis is identified. The anterior cerebral arteries are patent. No intracranial aneurysm is identified. Posterior circulation: The non dominant right vertebral artery is developmentally diminutive, but patent. The dominant intracranial left vertebral artery is patent. Focal soft and calcified plaque within this vessel with moderate stenosis at this site. Developmentally diminutive basilar artery in the presence of predominantly fetal origin bilateral posterior cerebral arteries. The basilar  artery is patent. The posterior cerebral arteries are patent. Atherosclerotic irregularity of the distal PCAs bilaterally. Venous sinuses: Within the limitations of contrast timing, no convincing thrombus. Anatomic variants: As described Review of the MIP images confirms the above findings These results were called by telephone at the time of interpretation on 04/25/2020 at 1:07 pm to provider Dr, Cheri Fowler, who verbally acknowledged these results. IMPRESSION: CTA neck: 1. The bilateral common and internal carotid arteries are patent within the neck without stenosis. Mild atherosclerotic plaque within the carotid bifurcations and proximal ICAs. 2. Vertebral arteries patent within the neck. Mild atherosclerotic narrowing at the origin of the dominant left vertebral artery. 3. Thyromegaly. CTA head: 1. No intracranial large vessel occlusion or proximal high-grade arterial stenosis. 2. Intracranial atherosclerotic disease most notably as follows. 3. Moderate stenosis within the dominant intracranial left vertebral artery. 4. Calcified plaque within both intracranial ICAs with no more than mild stenosis. 5. Atherosclerotic irregularity of the distal PCAs bilaterally. Electronically Signed   By: Kellie Simmering DO   On: 04/25/2020 13:07   CT ANGIO NECK CODE STROKE  Result Date: 04/25/2020 CLINICAL DATA:  Stroke/TIA, assess extracranial arteries, assess intracranial arteries. EXAM: CT ANGIOGRAPHY HEAD AND NECK TECHNIQUE: Multidetector CT  imaging of the head and neck was performed using the standard protocol during bolus administration of intravenous contrast. Multiplanar CT image reconstructions and MIPs were obtained to evaluate the vascular anatomy. Carotid stenosis measurements (when applicable) are obtained utilizing NASCET criteria, using the distal internal carotid diameter as the denominator. CONTRAST:  10mL OMNIPAQUE IOHEXOL 350 MG/ML SOLN COMPARISON:  Concurrently performed noncontrast head CT. FINDINGS: CTA NECK  FINDINGS Aortic arch: Standard aortic branching. Minimal calcified plaque within the aortic arch. No hemodynamically significant innominate or proximal subclavian artery stenosis. Right carotid system: CCA and ICA patent within the neck without stenosis. Mild atherosclerotic plaque within the carotid bifurcation and proximal ICA as well as distal cervical ICA. Left carotid system: CCA and ICA patent within the neck without stenosis. Mild atherosclerotic plaque within the carotid bifurcation and proximal ICA. Vertebral arteries: Patent within the neck bilaterally. Mild atherosclerotic narrowing at the origin of the dominant left vertebral artery. Skeleton: No acute bony abnormality or aggressive osseous lesion. Cervical spondylosis with multilevel disc space narrowing, disc bulges, posterior disc osteophytes, uncovertebral hypertrophy and facet arthrosis. Other neck: Enlarged thyroid gland. 9 mm left thyroid lobe nodule not meeting consensus criteria for ultrasound follow-up. No neck mass or cervical lymphadenopathy. Upper chest: No consolidation within the imaged lung apices. Review of the MIP images confirms the above findings CTA HEAD FINDINGS Anterior circulation: The intracranial internal carotid arteries are patent. Calcified plaque within both vessels with no more than mild stenosis. The M1 middle cerebral arteries are patent. No M2 proximal branch occlusion or high-grade proximal stenosis is identified. The anterior cerebral arteries are patent. No intracranial aneurysm is identified. Posterior circulation: The non dominant right vertebral artery is developmentally diminutive, but patent. The dominant intracranial left vertebral artery is patent. Focal soft and calcified plaque within this vessel with moderate stenosis at this site. Developmentally diminutive basilar artery in the presence of predominantly fetal origin bilateral posterior cerebral arteries. The basilar artery is patent. The posterior cerebral  arteries are patent. Atherosclerotic irregularity of the distal PCAs bilaterally. Venous sinuses: Within the limitations of contrast timing, no convincing thrombus. Anatomic variants: As described Review of the MIP images confirms the above findings These results were called by telephone at the time of interpretation on 04/25/2020 at 1:07 pm to provider Dr, Cheri Fowler, who verbally acknowledged these results. IMPRESSION: CTA neck: 1. The bilateral common and internal carotid arteries are patent within the neck without stenosis. Mild atherosclerotic plaque within the carotid bifurcations and proximal ICAs. 2. Vertebral arteries patent within the neck. Mild atherosclerotic narrowing at the origin of the dominant left vertebral artery. 3. Thyromegaly. CTA head: 1. No intracranial large vessel occlusion or proximal high-grade arterial stenosis. 2. Intracranial atherosclerotic disease most notably as follows. 3. Moderate stenosis within the dominant intracranial left vertebral artery. 4. Calcified plaque within both intracranial ICAs with no more than mild stenosis. 5. Atherosclerotic irregularity of the distal PCAs bilaterally. Electronically Signed   By: Kellie Simmering DO   On: 04/25/2020 13:07    ____________________________________________   PROCEDURES  Procedure(s) performed (including Critical Care):  .1-3 Lead EKG Interpretation Performed by: Naaman Plummer, MD Authorized by: Naaman Plummer, MD     Interpretation: normal     ECG rate:  81   ECG rate assessment: normal     Rhythm: sinus rhythm     Ectopy: none     Conduction: normal       ____________________________________________   INITIAL IMPRESSION / ASSESSMENT AND PLAN / ED COURSE  As part of my medical decision making, I reviewed the following data within the [place] notes reviewed and incorporated, Labs reviewed, EKG interpreted, Old chart reviewed, Radiograph reviewed and Notes from prior ED visits  reviewed and incorporated        Patient is a 65 year old Cory with a stated past medical history of Parkinson's disease who presents for right-sided weakness and aphasia  Differential diagnosis for this patient includes but is not limited to: CVA, TIA, cervical spinal injury, peripheral neuropathy  Plan: Stroke work-up including CBC, CMP, troponin, EKG, CT Noncon of the head, CTA head/neck, MR brain without  Consult: Neurology- given no stroke seen acutely on MR, recommend treating patient with migraine cocktail and monitor for resolution of symptoms.  Dispo: Care of this patient will be signed out to the oncoming physician at the end of my shift.  All pertinent patient information conveyed and all questions answered.  All further care and disposition decisions will be made by the oncoming physician.      ____________________________________________   FINAL CLINICAL IMPRESSION(S) / ED DIAGNOSES  Final diagnoses:  Acute right-sided weakness  Aphasia  Migraine with aura and without status migrainosus, not intractable     ED Discharge Orders    None       Note:  This document was prepared using Dragon voice recognition software and may include unintentional dictation errors.   Naaman Plummer, MD 04/25/20 [phone]

## 2020-04-25 NOTE — Progress Notes (Signed)
CD Stroke, patient was taken to CT, wife arrived. I was able to sit and be present with her while husband was in CT. We both shared prayer, I asked her if she needed Chaplain again to please allow staff page On-call

## 2020-04-25 NOTE — ED Notes (Signed)
Code stroke called to [facility] per Otho Darner

## 2020-04-25 NOTE — ED Notes (Signed)
Pt in MRI.

## 2020-04-25 NOTE — Consult Note (Addendum)
NEUROLOGY CONSULTATION NOTE   Date of service: April 25, 2020 Patient Name: Cory [name] MRN:  [id number] DOB:  1955/07/17 Reason for consult: "Stroke code" _ _ _   _ __   _ __ _ _  __ __   _ __   __ _  History of Present Illness  Cory [name] is a 65 y.o. male with PMH significant for history of migraines, asthma, reflux disease, Parkinson's disease since 2010, dyskinesias who presents with an episode of acute onset aphasia and right-sided weakness and numbness.  Symptoms started around 11:15 AM on 04/25/2020.  Patient was working in his workshop when he had his typical migraine headache but this time it seems like it went from right to the left along with some buzzing in his head, shortly after the headache, he had some flashing and rainbow in his vision. He kept working when noted that he had difficulty finding the right words to search something on YouTube.  He felt that his right side was heavy and numb.  He walked back to the house and tried to tell his wife but could not get the right words out.  He was eventually able to tell her that he was having a stroke.  EMS was called right away and he was brought into the emergency department.  He was noted to have resolution of the right-sided numbness in the ED.  His aphasia kept fluctuating on presentation.  On my initial assessment, his NIH was down to a 0 and he had no aphasia on standard stroke assessment scale.  He was able to engage in a conversation with no difficulty at all.  Shortly following this, he was noted to have redeveloped word finding difficulties which lasted a few minutes this time and then went away.  Work-up with a stat CT head without contrast and CT angio was negative for large territory stroke or ICH and there was no significant stenosis or large vessel occlusion.  Given fluctuating symptoms, we decided to hold tPA and instead get a stat limited MRI with diffusion-weighted imaging, T2 flair and SWI.  His MRI was negative  for a stroke.  He denies any prior history of strokes.  He does not take aspirin at home.  Endorses history of migraine but denies any prior history of hemiplegic migraines.  This is not a thunderclap headache. Endorses that he recently was started on Comtan but has been taking it without any issues for the last week.  Serotonergic agents that he has recently started on.  He was not bearing down when this started.  He denies any arm or leg weakness or numbness at this time, no dysarthria, no vision changes, headache is significantly improved.   ROS   Constitutional Denies weight loss, fever and chills.   HEENT Denies changes in vision and hearing.   Respiratory Denies SOB and cough.   CV Denies palpitations and CP   GI Denies abdominal pain, nausea, vomiting and diarrhea.   GU Denies dysuria and urinary frequency.   MSK Denies myalgia and joint pain.   Skin Denies rash and pruritus.   Neurological Denies headache and syncope.   Psychiatric Denies recent changes in mood. Denies anxiety and depression.    Past History   Past Medical History:  Diagnosis Date  . Arthritis    hips and back  . Asthma    as a baby-   . GERD (gastroesophageal reflux disease)    rare -   . Parkinson's  disease ([place])   . Pneumonia    age 64  . Rash    legs- on going   Past Surgical History:  Procedure Laterality Date  . APPENDECTOMY    . BACK SURGERY     posterior lumbar spine fusion L4-5  . COLONOSCOPY    . COLONOSCOPY WITH PROPOFOL N/A 06/06/2017   Procedure: COLONOSCOPY WITH PROPOFOL;  Surgeon: Manya Silvas, MD;  Location: [facility] ENDOSCOPY;  Service: Endoscopy;  Laterality: N/A;  . KNEE ARTHROSCOPY Bilateral    ACL  . KNEE ARTHROSCOPY W/ ACL RECONSTRUCTION    . RADIAL KERATOTOMY    . ROTATOR CUFF REPAIR Bilateral   . SHOULDER ARTHROSCOPY WITH BICEPS TENDON REPAIR Left 04/16/2019   Procedure: SHOULDER ARTHROSCOPY WITH BICEPS TENDON REPAIR, MINI OPEN SUPERIOR CAPSULAR RECONSTRUCTION, BICEPS  TENODESIS;  Surgeon: Leim Fabry, MD;  Location: [facility] ORS;  Service: Orthopedics;  Laterality: Left;   History reviewed. No pertinent family history. Social History   Socioeconomic History  . Marital status: Married    Spouse name: Not on file  . Number of children: Not on file  . Years of education: Not on file  . Highest education level: Not on file  Occupational History  . Not on file  Tobacco Use  . Smoking status: Former Smoker    Packs/day: 0.25    Years: 23.00    Pack years: 5.75    Types: Cigarettes, Cigars  . Smokeless tobacco: Never Used  . Tobacco comment: smokes 1 cigar per day 04/30/2016  Substance and Sexual Activity  . Alcohol use: Yes    Alcohol/week: 1.0 standard drink    Types: 1 Shots of liquor per week  . Drug use: No  . Sexual activity: Not on file  Other Topics Concern  . Not on file  Social History Narrative  . Not on file   Social Determinants of Health   Financial Resource Strain: Not on file  Food Insecurity: Not on file  Transportation Needs: Not on file  Physical Activity: Not on file  Stress: Not on file  Social Connections: Not on file   No Known Allergies  Medications  (Not in a hospital admission)    Vitals   Vitals:   04/25/20 1245 04/25/20 1256 04/25/20 1305 04/25/20 1406  BP: (!) 175/99  (!) 146/90 (!) 187/99  Pulse: 83  85 77  Resp: (!) 26  (!) 25 19  SpO2: 99%  99% 98%  Weight:  69.9 kg       Body mass index is 23.43 kg/m.  Physical Exam   General: Laying comfortably in bed; in no acute distress.  HENT: Normal oropharynx and mucosa. Normal external appearance of ears and nose.  Neck: Supple, no pain or tenderness  CV: No JVD. No peripheral edema.  Pulmonary: Symmetric Chest rise. Normal respiratory effort.  Abdomen: Soft to touch, non-tender.  Ext: No cyanosis, edema, or deformity  Skin: No rash. Normal palpation of skin.   Musculoskeletal: Normal digits and nails by inspection. No clubbing.   Neurologic  Examination  Mental status/Cognition: Alert, oriented to self, place, month and year, good attention.  Speech/language: Fluent, comprehension intact, object naming intact, repetition intact.  Cranial nerves:   CN II Pupils equal and reactive to light, no VF deficits    CN III,IV,VI EOM intact, no gaze preference or deviation, no nystagmus    CN V normal sensation in V1, V2, and V3 segments bilaterally    CN VII no asymmetry, no nasolabial fold flattening  CN VIII normal hearing to speech    CN IX & X normal palatal elevation, no uvular deviation    CN XI 5/5 head turn and 5/5 shoulder shrug bilaterally    CN XII midline tongue protrusion    Motor:  Muscle bulk: normal, tone normal, pronator drift none tremor yes, dyskinetic tremors at rest and with motion. Mvmt Root Nerve  Muscle Right Left Comments  SA C5/6 Ax Deltoid 5 5   EF C5/6 Mc Biceps 5 5   EE C6/7/8 Rad Triceps 5 5   WF C6/7 Med FCR 5 5   WE C7/8 PIN ECU 5 5   F Ab C8/T1 U ADM/FDI 5 5   HF L1/2/3 Fem Illopsoas 5 5   KE L2/3/4 Fem Quad 5 5   DF L4/5 D Peron Tib Ant 5 5   PF S1/2 Tibial Grc/Sol 5 5    Reflexes:  Right Left Comments  Pectoralis      Biceps (C5/6) 2 2   Brachioradialis (C5/6) 2 2    Triceps (C6/7) 2 2    Patellar (L3/4) 2 2    Achilles (S1)      Hoffman      Plantar     Jaw jerk    Sensation:  Light touch Intact throughout   Pin prick    Temperature    Vibration   Proprioception    Coordination/Complex Motor:  - Finger to Nose intact BL - Heel to shin intact BL - Rapid alternating movement are normal - Gait: deferred.  Labs   CBC:  Recent Labs  Lab 04/25/20 1221  WBC 11.5*  NEUTROABS 5.8  HGB 13.8  HCT 42.2  MCV 88.7  PLT 243    Basic Metabolic Panel:  Lab Results  Component Value Date   NA 139 04/25/2020   K 4.2 04/25/2020   CO2 26 04/25/2020   GLUCOSE 112 (H) 04/25/2020   BUN 22 04/25/2020   CREATININE 1.49 (H) 04/25/2020   CALCIUM 9.1 04/25/2020   GFRNONAA 52 (L)  04/25/2020   GFRAA 58 (L) 06/29/2019   Lipid Panel: No results found for: LDLCALC HgbA1c: No results found for: HGBA1C Urine Drug Screen: No results found for: LABOPIA, COCAINSCRNUR, LABBENZ, AMPHETMU, THCU, LABBARB  Alcohol Level     Component Value Date/Time   ETH <10 04/25/2020 1221    CT Head without contrast: CTH was negative for a large hypodensity concerning for a large territory infarct or hyperdensity concerning for an ICH  CT angio Head and Neck with contrast: No LVO on my read.  MRI Brain Limited. No DWI changes concerning for an acute stroke.  Impression   Cory [name] is a 65 y.o. male presenting with a right-sided headache, rainbow in his vision, shortly followed by right-sided weakness and numbness and difficulty finding words.  Most of his symptoms are resolved by the time he was brought into the emergency department.  He had fluctuating aphasia in the ED.  Work-up with CT head and CTA was negative for a large stroke, no ICH, no LVO or significant stenosis.  tPA was considered but given significant concern for hemiplegic migraine, it was held off and instead we opted to get an MRI brain on him.  Limited MRI brain with no diffusion restriction or significant T2 flair abnormalities.  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  Recommendations  - I ordered IV magnesium 2 g once. - I ordered fluids bolus. - I ordered Excedrin Migraine x1 only. - Would recommend holding off on antihistamines or dopaminergic agents as they could potentially worsen his dyskinesias. - I do not think he needs to be admitted if his symptoms are resolved with above. - Recommend follow-up with his outpatient neurologist Dr. Manuella Ghazi within 1 to 2 weeks to see if starting him on a preventative migraine medication should be  considered. ______________________________________________________________________  This patient is critically ill and at significant risk of neurological worsening, death and care requires constant monitoring of vital signs, hemodynamics,respiratory and cardiac monitoring, neurological assessment, discussion with family, other specialists and medical decision making of high complexity. I spent 60 minutes of neurocritical care time  in the care of  this patient. This was time spent independent of any time provided by nurse practitioner or PA.  Donnetta Simpers [facility] Pager Number [phone] 04/25/2020  2:25 PM  Thank you for the opportunity to take part in the care of this patient. If you have any further questions, please contact the neurology consultation attending.  Signed,  [place] Pager Number [phone] _ _ _   _ __   _ __ _ _  __ __   _ __   __ _

## 2020-04-25 NOTE — ED Provider Notes (Signed)
°  Patient received in signout from Dr. Leory Plowman her pending migraine cocktail for treatment of complex migraine.  Initially presented as a stroke alert.  Upon reassessment, patient reports improving symptoms and requesting discharge.  I see no barriers to outpatient management, and we provided information for neurology follow-up and return precautions for the ED were discussed.   Vladimir Crofts, MD 04/25/20 1710

## 2020-04-25 NOTE — ED Triage Notes (Signed)
Pt BIB EMS from home for R sided numbness. LKW 1115 this morning. Pt AOx4, able to follow commands. Pt has hx of parkinsons but states that he is struggling to get his words out. MD aware

## 2020-05-20 ENCOUNTER — Ambulatory Visit: Payer: Medicare PPO | Attending: Pulmonary Disease

## 2020-05-23 ENCOUNTER — Observation Stay
Admission: EM | Admit: 2020-05-23 | Discharge: 2020-05-24 | Disposition: A | Discharge status: Home / Self Care | Payer: Medicare PPO | Attending: Internal Medicine | Admitting: Internal Medicine

## 2020-05-23 ENCOUNTER — Encounter: Payer: Self-pay | Admitting: Internal Medicine

## 2020-05-23 ENCOUNTER — Emergency Department: Payer: Medicare PPO

## 2020-05-23 ENCOUNTER — Other Ambulatory Visit: Payer: Self-pay

## 2020-05-23 ENCOUNTER — Observation Stay: Payer: Medicare PPO

## 2020-05-23 DIAGNOSIS — J45909 Unspecified asthma, uncomplicated: Secondary | ICD-10-CM | POA: Diagnosis not present

## 2020-05-23 DIAGNOSIS — Z87891 Personal history of nicotine dependence: Secondary | ICD-10-CM | POA: Insufficient documentation

## 2020-05-23 DIAGNOSIS — Z79899 Other long term (current) drug therapy: Secondary | ICD-10-CM | POA: Insufficient documentation

## 2020-05-23 DIAGNOSIS — Z8669 Personal history of other diseases of the nervous system and sense organs: Secondary | ICD-10-CM

## 2020-05-23 DIAGNOSIS — N400 Enlarged prostate without lower urinary tract symptoms: Secondary | ICD-10-CM

## 2020-05-23 DIAGNOSIS — R4701 Aphasia: Secondary | ICD-10-CM | POA: Diagnosis not present

## 2020-05-23 DIAGNOSIS — R531 Weakness: Secondary | ICD-10-CM | POA: Diagnosis present

## 2020-05-23 DIAGNOSIS — G20 Parkinson's disease: Secondary | ICD-10-CM | POA: Diagnosis not present

## 2020-05-23 DIAGNOSIS — F32A Depression, unspecified: Secondary | ICD-10-CM | POA: Diagnosis present

## 2020-05-23 DIAGNOSIS — G459 Transient cerebral ischemic attack, unspecified: Secondary | ICD-10-CM

## 2020-05-23 DIAGNOSIS — G43909 Migraine, unspecified, not intractable, without status migrainosus: Secondary | ICD-10-CM | POA: Diagnosis present

## 2020-05-23 DIAGNOSIS — R569 Unspecified convulsions: Secondary | ICD-10-CM | POA: Diagnosis not present

## 2020-05-23 DIAGNOSIS — Z20822 Contact with and (suspected) exposure to covid-19: Secondary | ICD-10-CM | POA: Insufficient documentation

## 2020-05-23 LAB — DIFFERENTIAL
Abs Immature Granulocytes: 0.04 10*3/uL (ref 0.00–0.07)
Basophils Absolute: 0.1 10*3/uL (ref 0.0–0.1)
Basophils Relative: 1 %
Eosinophils Absolute: 3.4 10*3/uL — ABNORMAL HIGH (ref 0.0–0.5)
Eosinophils Relative: 28 %
Immature Granulocytes: 0 %
Lymphocytes Relative: 17 %
Lymphs Abs: 2.1 10*3/uL (ref 0.7–4.0)
Monocytes Absolute: 1 10*3/uL (ref 0.1–1.0)
Monocytes Relative: 8 %
Neutro Abs: 5.6 10*3/uL (ref 1.7–7.7)
Neutrophils Relative %: 46 %
Smear Review: NORMAL

## 2020-05-23 LAB — CBC
HCT: 42.5 % (ref 39.0–52.0)
Hemoglobin: 14 g/dL (ref 13.0–17.0)
MCH: 28.7 pg (ref 26.0–34.0)
MCHC: 32.9 g/dL (ref 30.0–36.0)
MCV: 87.3 fL (ref 80.0–100.0)
Platelets: 232 10*3/uL (ref 150–400)
RBC: 4.87 MIL/uL (ref 4.22–5.81)
RDW: 13.3 % (ref 11.5–15.5)
WBC: 12.2 10*3/uL — ABNORMAL HIGH (ref 4.0–10.5)
nRBC: 0 % (ref 0.0–0.2)

## 2020-05-23 LAB — HIV ANTIBODY (ROUTINE TESTING W REFLEX): HIV Screen 4th Generation wRfx: NONREACTIVE

## 2020-05-23 LAB — COMPREHENSIVE METABOLIC PANEL
ALT: <5 U/L (ref 0–44)
AST: 12 U/L — ABNORMAL LOW (ref 15–41)
Albumin: 3.8 g/dL (ref 3.5–5.0)
Alkaline Phosphatase: 90 U/L (ref 38–126)
Anion gap: 9 (ref 5–15)
BUN: 23 mg/dL (ref 8–23)
CO2: 27 mmol/L (ref 22–32)
Calcium: 9.4 mg/dL (ref 8.9–10.3)
Chloride: 101 mmol/L (ref 98–111)
Creatinine, Ser: 1.39 mg/dL — ABNORMAL HIGH (ref 0.61–1.24)
GFR, Estimated: 56 mL/min — ABNORMAL LOW (ref >60)
Glucose, Bld: 95 mg/dL (ref 70–99)
Potassium: 4 mmol/L (ref 3.5–5.1)
Sodium: 137 mmol/L (ref 135–145)
Total Bilirubin: 0.8 mg/dL (ref 0.3–1.2)
Total Protein: 7.3 g/dL (ref 6.5–8.1)

## 2020-05-23 LAB — VITAMIN B12: Vitamin B-12: 240 pg/mL (ref 180–914)

## 2020-05-23 LAB — URINE DRUG SCREEN, QUALITATIVE (ARMC ONLY)
Amphetamines, Ur Screen: NOT DETECTED
Barbiturates, Ur Screen: NOT DETECTED
Benzodiazepine, Ur Scrn: NOT DETECTED
Cannabinoid 50 Ng, Ur ~~LOC~~: NOT DETECTED
Cocaine Metabolite,Ur ~~LOC~~: NOT DETECTED
MDMA (Ecstasy)Ur Screen: NOT DETECTED
Methadone Scn, Ur: NOT DETECTED
Opiate, Ur Screen: NOT DETECTED
Phencyclidine (PCP) Ur S: NOT DETECTED
Tricyclic, Ur Screen: NOT DETECTED

## 2020-05-23 LAB — URINALYSIS, COMPLETE (UACMP) WITH MICROSCOPIC
Bacteria, UA: NONE SEEN
Bilirubin Urine: NEGATIVE
Glucose, UA: NEGATIVE mg/dL
Ketones, ur: NEGATIVE mg/dL
Leukocytes,Ua: NEGATIVE
Nitrite: NEGATIVE
Protein, ur: NEGATIVE mg/dL
Specific Gravity, Urine: 1.008 (ref 1.005–1.030)
Squamous Epithelial / HPF: NONE SEEN (ref 0–5)
pH: 8 (ref 5.0–8.0)

## 2020-05-23 LAB — LACTIC ACID, PLASMA
Lactic Acid, Venous: 1.8 mmol/L (ref 0.5–1.9)
Lactic Acid, Venous: 1.9 mmol/L (ref 0.5–1.9)

## 2020-05-23 LAB — CBG MONITORING, ED: Glucose-Capillary: 108 mg/dL — ABNORMAL HIGH (ref 70–99)

## 2020-05-23 LAB — FOLATE: Folate: 13.1 ng/mL (ref >5.9)

## 2020-05-23 LAB — TROPONIN I (HIGH SENSITIVITY)
Troponin I (High Sensitivity): 6 ng/L (ref <18)
Troponin I (High Sensitivity): 8 ng/L (ref <18)

## 2020-05-23 LAB — APTT: aPTT: 31 seconds (ref 24–36)

## 2020-05-23 LAB — PROTIME-INR
INR: 1 (ref 0.8–1.2)
Prothrombin Time: 13 seconds (ref 11.4–15.2)

## 2020-05-23 LAB — SARS CORONAVIRUS 2 (TAT 6-24 HRS): SARS Coronavirus 2: NEGATIVE

## 2020-05-23 MED ORDER — FLUDROCORTISONE ACETATE 0.1 MG PO TABS
100.0000 ug | ORAL_TABLET | Freq: Every evening | ORAL | Status: DC
  Administered 2020-05-23: 18:00:00 100 ug via ORAL
  Filled 2020-05-23 (×3): qty 1

## 2020-05-23 MED ORDER — STROKE: EARLY STAGES OF RECOVERY BOOK: Freq: Once | Status: AC

## 2020-05-23 MED ORDER — MAGNESIUM SULFATE IN D5W 1-5 GM/100ML-% IV SOLN
1.0000 g | Freq: Once | INTRAVENOUS | Status: AC
  Administered 2020-05-23: 18:00:00 1 g via INTRAVENOUS
  Filled 2020-05-23 (×2): qty 100

## 2020-05-23 MED ORDER — LEVETIRACETAM IN NACL 1000 MG/100ML IV SOLN
1000.0000 mg | Freq: Once | INTRAVENOUS | Status: DC
  Filled 2020-05-23: qty 100

## 2020-05-23 MED ORDER — FLUTICASONE FUROATE-VILANTEROL 100-25 MCG/INH IN AEPB
1.0000 | INHALATION_SPRAY | Freq: Every day | RESPIRATORY_TRACT | Status: DC
  Filled 2020-05-23: qty 28

## 2020-05-23 MED ORDER — MIRABEGRON ER 50 MG PO TB24
50.0000 mg | ORAL_TABLET | Freq: Every day | ORAL | Status: DC
  Administered 2020-05-23: 50 mg via ORAL
  Filled 2020-05-23 (×3): qty 1

## 2020-05-23 MED ORDER — DIPHENHYDRAMINE HCL 50 MG/ML IJ SOLN
25.0000 mg | Freq: Once | INTRAMUSCULAR | Status: AC
  Administered 2020-05-23: 25 mg via INTRAVENOUS
  Filled 2020-05-23: qty 1

## 2020-05-23 MED ORDER — ENTACAPONE 200 MG PO TABS
200.0000 mg | ORAL_TABLET | Freq: Three times a day (TID) | ORAL | Status: DC
  Administered 2020-05-23 – 2020-05-24 (×4): 200 mg via ORAL
  Filled 2020-05-23 (×8): qty 1

## 2020-05-23 MED ORDER — TAMSULOSIN HCL 0.4 MG PO CAPS
0.4000 mg | ORAL_CAPSULE | Freq: Every day | ORAL | Status: DC
  Administered 2020-05-23: 18:00:00 0.4 mg via ORAL
  Filled 2020-05-23: qty 1

## 2020-05-23 MED ORDER — ASPIRIN 325 MG PO TABS
325.0000 mg | ORAL_TABLET | Freq: Every day | ORAL | Status: DC
  Administered 2020-05-24: 08:00:00 325 mg via ORAL
  Filled 2020-05-23: qty 1

## 2020-05-23 MED ORDER — ACETAMINOPHEN 325 MG PO TABS
650.0000 mg | ORAL_TABLET | ORAL | Status: DC | PRN
  Administered 2020-05-23 – 2020-05-24 (×2): 650 mg via ORAL
  Filled 2020-05-23 (×2): qty 2

## 2020-05-23 MED ORDER — ATORVASTATIN CALCIUM 20 MG PO TABS
40.0000 mg | ORAL_TABLET | Freq: Every day | ORAL | Status: DC
  Administered 2020-05-24: 40 mg via ORAL
  Filled 2020-05-23: qty 2

## 2020-05-23 MED ORDER — ACETAMINOPHEN 650 MG RE SUPP: 650.0000 mg | RECTAL | Status: DC | PRN

## 2020-05-23 MED ORDER — ACETAMINOPHEN 160 MG/5ML PO SOLN
650.0000 mg | ORAL | Status: DC | PRN
  Filled 2020-05-23: qty 20.3

## 2020-05-23 MED ORDER — SENNOSIDES-DOCUSATE SODIUM 8.6-50 MG PO TABS: 1.0000 | ORAL_TABLET | Freq: Every evening | ORAL | Status: DC | PRN

## 2020-05-23 MED ORDER — FLUTICASONE-UMECLIDIN-VILANT 100-62.5-25 MCG/INH IN AEPB: 1.0000 | INHALATION_SPRAY | Freq: Every day | RESPIRATORY_TRACT | Status: DC

## 2020-05-23 MED ORDER — TIZANIDINE HCL 4 MG PO TABS
4.0000 mg | ORAL_TABLET | Freq: Every day | ORAL | Status: DC | PRN
  Filled 2020-05-23: qty 1

## 2020-05-23 MED ORDER — UMECLIDINIUM BROMIDE 62.5 MCG/INH IN AEPB
1.0000 | INHALATION_SPRAY | Freq: Every day | RESPIRATORY_TRACT | Status: DC
  Filled 2020-05-23: qty 7

## 2020-05-23 MED ORDER — AMANTADINE HCL 100 MG PO CAPS
100.0000 mg | ORAL_CAPSULE | Freq: Three times a day (TID) | ORAL | Status: DC
  Administered 2020-05-23 – 2020-05-24 (×4): 100 mg via ORAL
  Filled 2020-05-23 (×7): qty 1

## 2020-05-23 MED ORDER — CARBIDOPA-LEVODOPA ER 50-200 MG PO TBCR
1.0000 | EXTENDED_RELEASE_TABLET | Freq: Every day | ORAL | Status: DC
  Administered 2020-05-23: 22:00:00 1 via ORAL
  Filled 2020-05-23 (×3): qty 1

## 2020-05-23 MED ORDER — SODIUM CHLORIDE 0.9% FLUSH
3.0000 mL | Freq: Once | INTRAVENOUS | Status: AC
Start: 2020-05-23 — End: 2020-05-23
  Administered 2020-05-23: 3 mL via INTRAVENOUS

## 2020-05-23 MED ORDER — LEVETIRACETAM IN NACL 1000 MG/100ML IV SOLN
1000.0000 mg | Freq: Once | INTRAVENOUS | Status: AC
  Administered 2020-05-23: 1000 mg via INTRAVENOUS
  Filled 2020-05-23: qty 100

## 2020-05-23 MED ORDER — CARBIDOPA-LEVODOPA 25-100 MG PO TABS
2.0000 | ORAL_TABLET | Freq: Three times a day (TID) | ORAL | Status: DC
  Administered 2020-05-23 – 2020-05-24 (×4): 2 via ORAL
  Filled 2020-05-23 (×7): qty 2

## 2020-05-23 MED ORDER — SERTRALINE HCL 50 MG PO TABS
50.0000 mg | ORAL_TABLET | Freq: Every day | ORAL | Status: DC
  Administered 2020-05-24: 50 mg via ORAL
  Filled 2020-05-23: qty 1

## 2020-05-23 MED ORDER — MIDODRINE HCL 5 MG PO TABS
2.5000 mg | ORAL_TABLET | Freq: Three times a day (TID) | ORAL | Status: DC | PRN
  Filled 2020-05-23: qty 0.5

## 2020-05-23 NOTE — Progress Notes (Signed)
SLP Cancellation Note  Patient Details Name: KELLER BOUNDS MRN: [id number] DOB: Apr 22, 1955   Cancelled treatment:       Reason Eval/Treat Not Completed: SLP screened, no needs identified, will sign off   Chart reviewed, pt screened with no acute need identified.   Briyah Wheelwright B. Rutherford Nail M.S., CCC-SLP, [place] Office [phone]    Stormy Fabian 05/23/2020, 3:28 PM

## 2020-05-23 NOTE — ED Provider Notes (Signed)
[facility] Emergency Department Provider Note   ____________________________________________   Event Date/Time   First MD Initiated Contact with Patient 05/23/20 1146     (approximate)  I have reviewed the triage vital signs and the nursing notes.   HISTORY  Chief Complaint Code Stroke    HPI Cory [name] is a 64 y.o. male with past medical history of Parkinson's disease, asthma, and GERD who presents to the ED for weakness.  History is limited due to patient's difficulty speaking.  Per EMS, patient was doing yard work when his wife noticed an acute change at 10:00 this morning.  She reported he developed difficulty speaking and seemed to be weaker on the right side.  Patient endorses difficulty speaking as well as a headache.  EMS reports a similar episode in January of this year with unclear etiology.  Patient denies any fevers, cough, chest pain, or shortness of breath.        Past Medical History:  Diagnosis Date  . Arthritis    hips and back  . Asthma    as a baby-   . GERD (gastroesophageal reflux disease)    rare -   . Parkinson's disease ([place])   . Pneumonia    age 54  . Rash    legs- on going    Patient Active Problem List   Diagnosis Date Noted  . TIA (transient ischemic attack) 05/23/2020  . Degenerative spondylolisthesis 05/07/2016    Past Surgical History:  Procedure Laterality Date  . APPENDECTOMY    . BACK SURGERY     posterior lumbar spine fusion L4-5  . COLONOSCOPY    . COLONOSCOPY WITH PROPOFOL N/A 06/06/2017   Procedure: COLONOSCOPY WITH PROPOFOL;  Surgeon: Manya Silvas, MD;  Location: [facility] ENDOSCOPY;  Service: Endoscopy;  Laterality: N/A;  . KNEE ARTHROSCOPY Bilateral    ACL  . KNEE ARTHROSCOPY W/ ACL RECONSTRUCTION    . RADIAL KERATOTOMY    . ROTATOR CUFF REPAIR Bilateral   . SHOULDER ARTHROSCOPY WITH BICEPS TENDON REPAIR Left 04/16/2019   Procedure: SHOULDER ARTHROSCOPY WITH BICEPS TENDON REPAIR, MINI  OPEN SUPERIOR CAPSULAR RECONSTRUCTION, BICEPS TENODESIS;  Surgeon: Leim Fabry, MD;  Location: [facility] ORS;  Service: Orthopedics;  Laterality: Left;    Prior to Admission medications   Medication Sig Start Date End Date Taking? Authorizing Provider  Amantadine HCl 100 MG tablet Take 100 mg by mouth 3 (three) times daily.    [provider]  azelastine (ASTELIN) 0.1 % nasal spray Place into both nostrils. 03/27/20   [provider]  carbidopa-levodopa (SINEMET CR) 50-200 MG tablet Take 1 tablet by mouth at bedtime.    [provider]  carbidopa-levodopa (SINEMET IR) 25-100 MG tablet Take 2 tablets by mouth 3 (three) times daily.     [provider]  entacapone (COMTAN) 200 MG tablet Take 200 mg by mouth 3 (three) times daily. 04/17/20   [provider]  fludrocortisone (FLORINEF) 0.1 MG tablet Take 100 mcg by mouth every evening. 04/01/20   [provider]  Fluticasone-Umeclidin-Vilant (TRELEGY ELLIPTA) 100-62.5-25 MCG/INH AEPB Inhale 1 puff into the lungs daily. 06/15/19   [provider]  midodrine (PROAMATINE) 2.5 MG tablet Take 2.5-5 mg by mouth See admin instructions. Take 5 mg at breakfast, 2.5 mg at lunch, 2.5 mg at supper 01/28/20   [provider]  MYRBETRIQ 50 MG TB24 tablet Take 50 mg by mouth daily. 03/06/20   [provider]  sertraline (ZOLOFT) 50  MG tablet Take 50 mg by mouth daily.    [provider]  tamsulosin (FLOMAX) 0.4 MG CAPS capsule Take 1 capsule (0.4 mg total) by mouth daily after supper. 04/08/18   Merlyn Lot, MD  tiZANidine (ZANAFLEX) 4 MG tablet Take 4 mg by mouth daily as needed for muscle spasms.     [provider]    Allergies Patient has no known allergies.  No family history on file.  Social History Social History   Tobacco Use  . Smoking status: Former Smoker    Packs/day: 0.25    Years: 23.00    Pack years: 5.75    Types: Cigarettes, Cigars  .  Smokeless tobacco: Never Used  . Tobacco comment: smokes 1 cigar per day 04/30/2016  Substance Use Topics  . Alcohol use: Yes    Alcohol/week: 1.0 standard drink    Types: 1 Shots of liquor per week  . Drug use: No    Review of Systems  Constitutional: No fever/chills Eyes: No visual changes. ENT: No sore throat. Cardiovascular: Denies chest pain. Respiratory: Denies shortness of breath. Gastrointestinal: No abdominal pain.  No nausea, no vomiting.  No diarrhea.  No constipation. Genitourinary: Negative for dysuria. Musculoskeletal: Negative for back pain. Skin: Negative for rash. Neurological: Positive for headaches and difficulty speaking, negative for focal weakness or numbness.  ____________________________________________   PHYSICAL EXAM:  VITAL SIGNS: ED Triage Vitals  Enc Vitals Group     BP 05/23/20 1138 (!) 144/76     Pulse Rate 05/23/20 1138 86     Resp 05/23/20 1140 (!) 24     Temp 05/23/20 1143 97.9 F (36.6 C)     Temp Source 05/23/20 1143 Oral     SpO2 05/23/20 1138 96 %     Weight 05/23/20 1145 154 lb 5.2 oz (70 kg)     Height 05/23/20 1145 5\' 8"  (1.727 m)     Head Circumference --      Peak Flow --      Pain Score 05/23/20 1144 4     Pain Loc --      Pain Edu? --      Excl. in [place]? --     Constitutional: Alert and oriented. Eyes: Conjunctivae are normal. Head: Atraumatic. Nose: No congestion/rhinnorhea. Mouth/Throat: Mucous membranes are moist. Neck: Normal ROM Cardiovascular: Normal rate, regular rhythm. Grossly normal heart sounds. Respiratory: Normal respiratory effort.  No retractions. Lungs CTAB. Gastrointestinal: Soft and nontender. No distention. Genitourinary: deferred Musculoskeletal: No lower extremity tenderness nor edema. Neurologic: Word finding difficulties noted.  Cephalic tremor noted with no facial droop or other obvious cranial nerve deficit.  No gross focal neurologic deficits are appreciated. Skin:  Skin is warm, dry and  intact. No rash noted. Psychiatric: Mood and affect are normal. Speech and behavior are normal.  ____________________________________________   LABS (all labs ordered are listed, but only abnormal results are displayed)  Labs Reviewed  CBC - Abnormal; Notable for the following components:      Result Value   WBC 12.2 (*)    All other components within normal limits  DIFFERENTIAL - Abnormal; Notable for the following components:   Eosinophils Absolute 3.4 (*)    All other components within normal limits  COMPREHENSIVE METABOLIC PANEL - Abnormal; Notable for the following components:   Creatinine, Ser 1.39 (*)    AST 12 (*)    GFR, Estimated 56 (*)    All other components within normal limits  CBG MONITORING, ED -  Abnormal; Notable for the following components:   Glucose-Capillary 108 (*)    All other components within normal limits  SARS CORONAVIRUS 2 (TAT 6-24 HRS)  CULTURE, BLOOD (ROUTINE X 2)  CULTURE, BLOOD (ROUTINE X 2)  PROTIME-INR  APTT  URINE DRUG SCREEN, QUALITATIVE ([facility] ONLY)  URINALYSIS, COMPLETE (UACMP) WITH MICROSCOPIC  LACTIC ACID, PLASMA  LACTIC ACID, PLASMA  HIV ANTIBODY (ROUTINE TESTING W REFLEX)  CBG MONITORING, ED  TROPONIN I (HIGH SENSITIVITY)   ____________________________________________  EKG  ED ECG REPORT I, Blake Divine, the attending physician, personally viewed and interpreted this ECG.   Date: 05/23/2020  EKG Time: 11:40  Rate: 83  Rhythm: normal sinus rhythm  Axis: Normal  Intervals:none  ST&T Change: None   PROCEDURES  Procedure(s) performed (including Critical Care):  Procedures   ____________________________________________   INITIAL IMPRESSION / ASSESSMENT AND PLAN / ED COURSE       65 year old male with past medical history of Parkinson disease, asthma, and GERD who presents to the ED for acute onset difficulty speaking with headache around 10 AM this morning.  Code stroke was called upon arrival due to patient's  apparent word finding difficulties, although no focal weakness identified on exam.  Reviewing his chart, he does have a history of Parkinson disease that would explain his head tremors, was also seen for similar presentation last month thought to be consistent with complex migraine.  MRI brain was negative at that time.  CT head reviewed by me and shows no acute hemorrhage, negative for acute process per radiology.  Labs are also unremarkable.  Patient evaluated by neurology, who requests infectious work-up as patient apparently hypotensive at home before taking a dose of midodrine.  Patient denies any infectious symptoms at this time.  Neurology also repeat MRI as well as EEG, loading dose of Keppra was ordered.  Case discussed with hospitalist for admission.      ____________________________________________   FINAL CLINICAL IMPRESSION(S) / ED DIAGNOSES  Final diagnoses:  Aphasia  Parkinson disease [facility])     ED Discharge Orders    None       Note:  This document was prepared using Dragon voice recognition software and may include unintentional dictation errors.   Blake Divine, MD 05/23/20 1253

## 2020-05-23 NOTE — Progress Notes (Signed)
PT Cancellation Note  Patient Details Name: Cory [name] MRN: [id number] DOB: 03-Nov-1955   Cancelled Treatment:    Reason Eval/Treat Not Completed: Patient not medically ready PT orders received, chart reviewed. Per MD, wait until MRI results are in to initiate PT services. Will f/u as able & as appropriate.   Lavone Nian, PT, DPT 05/23/20, 2:44 PM    [name] 05/23/2020, 2:43 PM

## 2020-05-23 NOTE — ED Triage Notes (Signed)
Pt presents to the [facility] via EMS from home as a Code Stroke. EMS states that pt was doing yard work when he had sudden onset of headache at 1000. Pt then began having difficulty speaking. EMS was then notified by pt's wife. EMS states that pt had a similar episode in January of this year. Pt currently A&Ox4 and able to move all extremities without difficulty but pt appears to have difficulty finding words and slow to respond to questions.

## 2020-05-23 NOTE — ED Notes (Signed)
Neurologist at bedside. 

## 2020-05-23 NOTE — Progress Notes (Signed)
OT Cancellation Note  Patient Details Name: Cory [name] MRN: [id number] DOB: 12/22/1955   Cancelled Treatment:    Reason Eval/Treat Not Completed: Patient not medically ready. OT orders received and chart reviewed. Per MD, wait until MRI results are in to initiate OT services. Will f/u as able and as appropriate.   Fredirick Maudlin, OTR/[place]

## 2020-05-23 NOTE — ED Notes (Signed)
Pt transported to MRI 

## 2020-05-23 NOTE — Progress Notes (Signed)
eeg done °

## 2020-05-23 NOTE — Procedures (Signed)
Patient Name: Cory [name]  MRN: [id number]  Epilepsy Attending: Lora Havens  Referring Physician/Provider: Dr Donnetta Simpers Date: 05/23/2020 Duration: 34.20 mins  Patient history: 65 y.o. male with PMH significant for Asthma, GERD, Parkinson's disease, migraine who presents with an episode of confusion and Right worse than left weakness. EEG to evaluate for seizure  Level of alertness: Awake  AEDs during EEG study: None  Technical aspects: This EEG study was done with scalp electrodes positioned according to the 10-20 International system of electrode placement. Electrical activity was acquired at a sampling rate of 500Hz  and reviewed with a high frequency filter of 70Hz  and a low frequency filter of 1Hz . EEG data were recorded continuously and digitally stored.   Description: The posterior dominant rhythm consists of 8 Hz activity of moderate voltage (25-35 uV) seen predominantly in posterior head regions, symmetric and reactive to eye opening and eye closing. EEG showed continuous 3 to 6 Hz theta-delta slowing in right hemisphere, maximal right temporal region. There is also intermittent rhythmic generalized 2-3Hz  delta slowing. Photic driving was not seen during photic stimulation. Hyperventilation was not performed.     ABNORMALITY -Continuous slow, right hemisphere, maximal right temporal region - Intermittent slow, generalized  IMPRESSION: This study is suggestive of cortical dysfunction in right hemisphere, maximal right temporal region which could be secondary to unerlying structural abnormality, post-ictal state. There is also mild diffuse encephalopathy, nonspecific etiology. No seizures or epileptiform discharges were seen throughout the recording.   Jedidiah Demartini Barbra Sarks

## 2020-05-23 NOTE — Consult Note (Signed)
NEUROLOGY CONSULTATION NOTE   Date of service: May 23, 2020 Patient Name: Cory [name] MRN:  [id number] DOB:  [birth year] Reason for consult: "Stroke code" _ _ _   _ __   _ __ _ _  __ __   _ __   __ _  History of Present Illness  JULIOCESAR BLASIUS is a 65 y.o. male with PMH significant for Asthma, GERD, Parkinson's disease, migraine who presents with an episode of confusion and Right worse than left weakness.  He reports that he was fine this AM, then had an episode of dizziness and felt weak on the right. He held on to the door and did not fall. Wife reports that his blood pressure was low to 70/30. She gave him midodrine 5mg  and he seemed fine. He went to his shop to work on his chainsaw but felt weird and weak with right worse than left weakness. He was confused and reports dizziness. Endorsed some brainfog that was over him and he could not concentrate on anything. HE got out of his shed, went to his wife and asked her to call 911. When EMS arrived and as they were trying to get him to the bathroonm, he kept walking oddly and going into the walls. They brought him in and on the way to the hospital, he seemed to be improving significantly and was pretty much back to his baseline. Here on arrival a stroke code was activated for R sided weakness. On my evaluation, he had no focal deficit. I had some concerns for his eyes to be prefering to look on the right but he was able to cross midline and move his eyes to look to the left side without any issues. His SBP in the ED was in 150s. Glucose was 108.  He denied any issues over the last few days, no signs or symptoms of a URI, UTI. No prior hx of seizures. Does endorse Bitemporal headache with some photophobia. No nausea, vomiting, abdominal pain. Feels he has been drinking less water over the last couple of days thou.  NIHSS components Score: Comment  1a Level of Conscious 0[x]  1[]  2[]  3[]      1b LOC Questions 0[x]  1[]  2[]       1c LOC Commands  0[x]  1[]  2[]       2 Best Gaze 0[x]  1[]  2[]       3 Visual 0[x]  1[]  2[]  3[]      4 Facial Palsy 0[x]  1[]  2[]  3[]      5a Motor Arm - left 0[x]  1[]  2[]  3[]  4[]  UN[]    5b Motor Arm - Right 0[x]  1[]  2[]  3[]  4[]  UN[]    6a Motor Leg - Left 0[x]  1[]  2[]  3[]  4[]  UN[]    6b Motor Leg - Right 0[x]  1[]  2[]  3[]  4[]  UN[]    7 Limb Ataxia 0[x]  1[]  2[]  3[]  UN[]     8 Sensory 0[x]  1[]  2[]  UN[]      9 Best Language 0[x]  1[]  2[]  3[]      10 Dysarthria 0[x]  1[]  2[]  UN[]      11 Extinct. and Inattention 0[x]  1[]  2[]       TOTAL: 0    MRS: 0 TPA: no, resolution of symptoms Thrombectomy: No, resolution of symptoms.   ROS   Constitutional Denies weight loss, fever and chills.  HEENT Denies changes in vision and hearing.  Respiratory Denies SOB and cough.  CV Denies palpitations and CP  GI Denies abdominal pain, nausea, vomiting and diarrhea.  GU Denies dysuria and urinary  frequency.  MSK Denies myalgia and joint pain.  Skin Denies rash and pruritus.  Neurological Endorses headache but no syncope.  Psychiatric Denies recent changes in mood. Denies anxiety and depression.   Past History   Past Medical History:  Diagnosis Date  . Arthritis    hips and back  . Asthma    as a baby-   . GERD (gastroesophageal reflux disease)    rare -   . Parkinson's disease ([place])   . Pneumonia    age 29  . Rash    legs- on going   Past Surgical History:  Procedure Laterality Date  . APPENDECTOMY    . BACK SURGERY     posterior lumbar spine fusion L4-5  . COLONOSCOPY    . COLONOSCOPY WITH PROPOFOL N/A 06/06/2017   Procedure: COLONOSCOPY WITH PROPOFOL;  Surgeon: Manya Silvas, MD;  Location: [facility] ENDOSCOPY;  Service: Endoscopy;  Laterality: N/A;  . KNEE ARTHROSCOPY Bilateral    ACL  . KNEE ARTHROSCOPY W/ ACL RECONSTRUCTION    . RADIAL KERATOTOMY    . ROTATOR CUFF REPAIR Bilateral   . SHOULDER ARTHROSCOPY WITH BICEPS TENDON REPAIR Left 04/16/2019   Procedure: SHOULDER ARTHROSCOPY WITH BICEPS TENDON REPAIR, MINI  OPEN SUPERIOR CAPSULAR RECONSTRUCTION, BICEPS TENODESIS;  Surgeon: Leim Fabry, MD;  Location: [facility] ORS;  Service: Orthopedics;  Laterality: Left;   No family history on file. Social History   Socioeconomic History  . Marital status: Married    Spouse name: Not on file  . Number of children: Not on file  . Years of education: Not on file  . Highest education level: Not on file  Occupational History  . Not on file  Tobacco Use  . Smoking status: Former Smoker    Packs/day: 0.25    Years: 23.00    Pack years: 5.75    Types: Cigarettes, Cigars  . Smokeless tobacco: Never Used  . Tobacco comment: smokes 1 cigar per day 04/30/2016  Substance and Sexual Activity  . Alcohol use: Yes    Alcohol/week: 1.0 standard drink    Types: 1 Shots of liquor per week  . Drug use: No  . Sexual activity: Not on file  Other Topics Concern  . Not on file  Social History Narrative  . Not on file   Social Determinants of Health   Financial Resource Strain: Not on file  Food Insecurity: Not on file  Transportation Needs: Not on file  Physical Activity: Not on file  Stress: Not on file  Social Connections: Not on file   No Known Allergies  Medications  (Not in a hospital admission)    Vitals   Vitals:   05/23/20 1143 05/23/20 1143 05/23/20 1145 05/23/20 1205  BP: (!) 142/91   (!) 157/107  Pulse: 85   87  Resp: 16   12  Temp: 97.9 F (36.6 C) 97.9 F (36.6 C)    TempSrc: Oral Oral    SpO2: 98%   98%  Weight:   70 kg   Height:   5\' 8"  (1.727 m)      Body mass index is 23.46 kg/m.  Physical Exam   General: Laying comfortably in bed; in no acute distress. HENT: Normal oropharynx and mucosa. Normal external appearance of ears and nose. Neck: Supple, no pain or tenderness CV: No JVD. No peripheral edema. Pulmonary: Symmetric Chest rise. Normal respiratory effort. Abdomen: Soft to touch, non-tender. Ext: No cyanosis, edema, or deformity Skin: No rash. Normal palpation of skin.  Musculoskeletal: Normal digits and nails by inspection. No clubbing.  Neurologic Examination  Mental status/Cognition: Alert, oriented to self, place, month and year, good attention. Speech/language: Fluent, comprehension intact, object naming intact, repetition intact. Cranial nerves:   CN II Pupils equal and reactive to light, no VF deficits   CN III,IV,VI EOM intact, no gaze preference or deviation, no nystagmus   CN V normal sensation in V1, V2, and V3 segments bilaterally   CN VII no asymmetry, no nasolabial fold flattening   CN VIII normal hearing to speech   CN IX & X normal palatal elevation, no uvular deviation   CN XI 5/5 head turn and 5/5 shoulder shrug bilaterally   CN XII midline tongue protrusion   Motor:  Muscle bulk: normal, tone cogwheeling but mild, pronator drift none tremor yes, high amplitude low frequency tremors with R worse than L. Mvmt Root Nerve  Muscle Right Left Comments  SA C5/6 Ax Deltoid 5 5   EF C5/6 Mc Biceps 5 5   EE C6/7/8 Rad Triceps 5 5   WF C6/7 Med FCR 5 5   WE C7/8 PIN ECU 5 5   F Ab C8/T1 U ADM/FDI 5 5   HF L1/2/3 Fem Illopsoas 5 5   KE L2/3/4 Fem Quad 5 5   DF L4/5 D Peron Tib Ant 5 5   PF S1/2 Tibial Grc/Sol 5 5    Reflexes:  Right Left Comments  Pectoralis      Biceps (C5/6) 2 2   Brachioradialis (C5/6) 2 2    Triceps (C6/7) 2 2    Patellar (L3/4) 3 3    Achilles (S1) 2 2    Hoffman      Plantar     Jaw jerk    Sensation:  Light touch Intact throughout   Pin prick    Temperature    Vibration   Proprioception    Coordination/Complex Motor:  - Finger to Nose intact BL - Heel to shin intact BL - Rapid alternating movement are slowed. - Gait: deferred.  Labs   CBC:  Recent Labs  Lab 05/23/20 1141  WBC 12.2*  HGB 14.0  HCT 42.5  MCV 87.3  PLT 998    Basic Metabolic Panel:  Lab Results  Component Value Date   NA 137 05/23/2020   K 4.0 05/23/2020   CO2 27 05/23/2020   GLUCOSE 95 05/23/2020   BUN 23  05/23/2020   CREATININE 1.39 (H) 05/23/2020   CALCIUM 9.4 05/23/2020   GFRNONAA 56 (L) 05/23/2020   GFRAA 58 (L) 06/29/2019   Lipid Panel: No results found for: LDLCALC HgbA1c: No results found for: HGBA1C Urine Drug Screen: No results found for: LABOPIA, COCAINSCRNUR, LABBENZ, AMPHETMU, THCU, LABBARB  Alcohol Level     Component Value Date/Time   ETH <10 04/25/2020 1221    CT Head without contrast: CTH was negative for a large hypodensity concerning for a large territory infarct or hyperdensity concerning for an ICH  MRI Brain pending  rEEG: pending.  Impression   DONTAVIUS KEIM is a 65 y.o. male with PMH significant for Asthma, GERD, Parkinson's disease, migraine who presents with an episode of confusion and Right worse than left weakness. Came in as a stroke code but no tPA or thrombectomy due to resolution of symptoms. Exam consistent with his hx of parkinsons but no focal deficit. Unclear what caused him to have this episode. This is his second time having a very similar episode with in  the last month. Perhaps this was all due to orthostatis which can happen in Parkinson's but his symptoms persisted for an hour and SBP was normal here and when checked by EMS earlier. Perhaps he has an underlying infection or other metabolic abnormality, parkison patients are high risk for delirium and althou would not be typical for infection, they can sometimes present with acute onset delirium. His glucose here was normal. Another consideration is a seizure, given there was focality to his symptoms with right worse than left weakness. Will also get Vitamin B12, Folate, TSH, Cortisol levels along with Co-oximetry to check for Carbon monoxide(both episodes happened in a shed).  Recommendations  - I ordered MRI Brain without contrast - I ordered TSH and Cortisol in AM - I ordered Orthostatic vitals x 1 for AM again.(Would wait on Midodrine to get out of his system) - I ordered rEEG - I ordered  Vit B12, folate. - I ordered Co-oximetry panel. - Headache cocktail with Magnesium and Toradol and Benadryl. - Will follow up on UDS. - Will follow up on infectious workup ordered by ED team. ______________________________________________________________________   Thank you for the opportunity to take part in the care of this patient. If you have any further questions, please contact the neurology consultation attending.  Signed,  [place] Pager Number [phone] _ _ _   _ __   _ __ _ _  __ __   _ __   __ _

## 2020-05-23 NOTE — H&P (Signed)
History and Physical    Cory [name] DDU:[id number] DOB: 13-Jan-1956 DOA: 05/23/2020  PCP: Adin Hector, MD   [place]: [place]  I have personally briefly reviewed patient's old medical records in [place]  Chief Complaint: Headache Most of the history was obtained from patient and his wife at the bedside.  HPI: Cory [name] is a 65 y.o. male with medical history significant for Parkinson's disease with autonomic instability, asthma, GERD who presents to the ED via EMS as a code stroke.  Patient was doing yard work this morning when he suddenly developed headache mostly behind his right eye and then felt very dizzy like he was going to pass out.  He called out to his wife who states that he has episodes of hypotension for which he takes midodrine.  She gave him 1 tablet of midodrine 10 mg.  Patient did not lose consciousness but she started he became confused and his speech was garbled.  She noticed that he kept bumping into surfaces and appeared to be confused.  By the time he arrived to the ER he was able to move all his extremities without difficulty but was noted to have difficulty finding words and slow to respond to questions. He had a similar episode on 04/25/20 and also presented as a code stroke at that time. He was discharged home after treatment for a complex migraine and advised to follow-up with neurology as an outpatient. Patient has a remote history of migraines and states that he usually has an aura prior to developing his migraine headache. He denied having any nausea, no vomiting, no light sensitivity, no chest pain, no shortness of breath, no fever, no chills, no cough, no abdominal pain, no diarrhea, no constipation, no dysuria, no hematuria, no frequency, no focal deficits, no weakness, no blurred vision. Labs show sodium 137, potassium 4.0, chloride 101, bicarb 27, glucose 95, BUN 23, creatinine 1.39, calcium 9.4, alkaline phosphatase 90,  albumin 3.8, AST 12, ALT less than 5, total protein 7.3, troponin VI, lactic acid 1.8, white count 12.2, hemoglobin 14.0, hematocrit 42.5, MCV 87.3, RDW 13.3, platelet count 232, PT 13.0, INR 1.0 X-ray reviewed by me shows no acute cardiopulmonary disease. CT scan of the head without contrast shows there is no acute intracranial hemorrhage or evidence of acute infarction. ASPECT score is 10. Twelve-lead EKG reviewed by me shows normal sinus rhythm.   ED Course: Patient is a 65 year old Caucasian male with a history of Parkinson's disease with complications of autonomic stability who presents to the ER for the second time in 1 month as a code stroke.  Patient with complaints of headache mostly behind his right eye similar to his prior history of migraines associated with an episode of confusion and dysarthria all of which have resolved except for the headache.  He received Tylenol, Benadryl and magnesium for his migraine headache.  He was seen by neurology who recommended dose of Keppra 1 g IV and has ordered an EEG.  Patient will be referred to observation status for further evaluation     Review of Systems: As per HPI otherwise all other systems reviewed and negative.    Past Medical History:  Diagnosis Date  . Arthritis    hips and back  . Asthma    as a baby-   . GERD (gastroesophageal reflux disease)    rare -   . Parkinson's disease ([place])   . Pneumonia    age 42  .  Rash    legs- on going    Past Surgical History:  Procedure Laterality Date  . APPENDECTOMY    . BACK SURGERY     posterior lumbar spine fusion L4-5  . COLONOSCOPY    . COLONOSCOPY WITH PROPOFOL N/A 06/06/2017   Procedure: COLONOSCOPY WITH PROPOFOL;  Surgeon: Manya Silvas, MD;  Location: [facility];  Service: Endoscopy;  Laterality: N/A;  . KNEE ARTHROSCOPY Bilateral    ACL  . KNEE ARTHROSCOPY W/ ACL RECONSTRUCTION    . RADIAL KERATOTOMY    . ROTATOR CUFF REPAIR Bilateral   . SHOULDER ARTHROSCOPY WITH  BICEPS TENDON REPAIR Left 04/16/2019   Procedure: SHOULDER ARTHROSCOPY WITH BICEPS TENDON REPAIR, MINI OPEN SUPERIOR CAPSULAR RECONSTRUCTION, BICEPS TENODESIS;  Surgeon: Leim Fabry, MD;  Location: [facility] ORS;  Service: Orthopedics;  Laterality: Left;     reports that he has quit smoking. His smoking use included cigarettes and cigars. He has a 5.75 pack-year smoking history. He has never used smokeless tobacco. He reports current alcohol use of about 1.0 standard drink of alcohol per week. He reports that he does not use drugs.  No Known Allergies  Family History  Problem Relation Age of Onset  . Hypertension Mother       Prior to Admission medications   Medication Sig Start Date End Date Taking? Authorizing Provider  Amantadine HCl 100 MG tablet Take 100 mg by mouth 3 (three) times daily.   Yes [provider]  azelastine (ASTELIN) 0.1 % nasal spray Place 1-2 sprays into both nostrils as directed. 03/27/20  Yes [provider]  carbidopa-levodopa (SINEMET CR) 50-200 MG tablet Take 1 tablet by mouth at bedtime.   Yes [provider]  carbidopa-levodopa (SINEMET IR) 25-100 MG tablet Take 2 tablets by mouth 3 (three) times daily.    Yes [provider]  entacapone (COMTAN) 200 MG tablet Take 200 mg by mouth 3 (three) times daily. 04/17/20  Yes [provider]  fludrocortisone (FLORINEF) 0.1 MG tablet Take 100 mcg by mouth every evening. 04/01/20  Yes [provider]  Fluticasone-Umeclidin-Vilant (TRELEGY ELLIPTA) 100-62.5-25 MCG/INH AEPB Inhale 1 puff into the lungs daily. 06/15/19  Yes [provider]  midodrine (PROAMATINE) 2.5 MG tablet Take 2.5-5 mg by mouth See admin instructions. Take 5mg  at breakfast (as needed), 2.5mg  at lunch (as needed) and take 2.5mg  at supper (as needed) for low blood pressure 01/28/20  Yes [provider]  MYRBETRIQ 50 MG TB24 tablet Take 50 mg by mouth at bedtime. 03/06/20  Yes [provider]  sertraline (ZOLOFT) 50 MG tablet Take 50 mg by mouth daily.   Yes [provider]  tamsulosin (FLOMAX) 0.4 MG CAPS capsule Take 1 capsule (0.4 mg total) by mouth daily after supper. 04/08/18  Yes Merlyn Lot, MD  tiZANidine (ZANAFLEX) 4 MG tablet Take 4 mg by mouth daily as needed for muscle spasms.    Yes [provider]    Physical Exam: Vitals:   05/23/20 1143 05/23/20 1145 05/23/20 1205 05/23/20 1230  BP:   (!) 157/107 (!) 154/83  Pulse:   87 81  Resp:   12 (!) 22  Temp: 97.9 F (36.6 C)     TempSrc: Oral     SpO2:   98% 97%  Weight:  70 kg    Height:  5\' 8"  (1.727 m)       Vitals:   05/23/20 1143 05/23/20 1145 05/23/20 1205 05/23/20 1230  BP:   (!) 157/107 Marland Kitchen)  154/83  Pulse:   87 81  Resp:   12 (!) 22  Temp: 97.9 F (36.6 C)     TempSrc: Oral     SpO2:   98% 97%  Weight:  70 kg    Height:  5\' 8"  (1.727 m)        Constitutional: Alert and oriented x 3 . Not in any apparent distress.  Slow to respond but appropriate HEENT:      Head: Normocephalic and atraumatic.         Eyes: PERLA, EOMI, Conjunctivae are normal. Sclera is non-icteric.       Mouth/Throat: Mucous membranes are moist.       Neck: Supple with no signs of meningismus. Cardiovascular: Regular rate and rhythm. No murmurs, gallops, or rubs. 2+ symmetrical distal pulses are present . No JVD. No LE edema Respiratory: Respiratory effort normal .Lungs sounds clear bilaterally. No wheezes, crackles, or rhonchi.  Gastrointestinal: Soft, non tender, and non distended with positive bowel sounds.  Genitourinary: No CVA tenderness. Musculoskeletal: Nontender with normal range of motion in all extremities. No cyanosis, or erythema of extremities. Neurologic:  Face is symmetric. Moving all extremities. No gross focal neurologic deficits  Skin: Skin is warm, dry.  No rash or ulcers Psychiatric: Mood and affect are normal   Labs on Admission: I have personally reviewed  following labs and imaging studies  CBC: Recent Labs  Lab 05/23/20 1141  WBC 12.2*  NEUTROABS 5.6  HGB 14.0  HCT 42.5  MCV 87.3  PLT 101   Basic Metabolic Panel: Recent Labs  Lab 05/23/20 1141  NA 137  K 4.0  CL 101  CO2 27  GLUCOSE 95  BUN 23  CREATININE 1.39*  CALCIUM 9.4   GFR: Estimated Creatinine Clearance: 51.3 mL/min (A) (by C-G formula based on SCr of 1.39 mg/dL (H)). Liver Function Tests: Recent Labs  Lab 05/23/20 1141  AST 12*  ALT <5  ALKPHOS 90  BILITOT 0.8  PROT 7.3  ALBUMIN 3.8   No results for input(s): LIPASE, AMYLASE in the last 168 hours. No results for input(s): AMMONIA in the last 168 hours. Coagulation Profile: Recent Labs  Lab 05/23/20 1141  INR 1.0   Cardiac Enzymes: No results for input(s): CKTOTAL, CKMB, CKMBINDEX, TROPONINI in the last 168 hours. BNP (last 3 results) No results for input(s): PROBNP in the last 8760 hours. HbA1C: No results for input(s): HGBA1C in the last 72 hours. CBG: Recent Labs  Lab 05/23/20 1139  GLUCAP 108*   Lipid Profile: No results for input(s): CHOL, HDL, LDLCALC, TRIG, CHOLHDL, LDLDIRECT in the last 72 hours. Thyroid Function Tests: No results for input(s): TSH, T4TOTAL, FREET4, T3FREE, THYROIDAB in the last 72 hours. Anemia Panel: No results for input(s): VITAMINB12, FOLATE, FERRITIN, TIBC, IRON, RETICCTPCT in the last 72 hours. Urine analysis:    Component Value Date/Time   COLORURINE YELLOW (A) 05/23/2020 1236   APPEARANCEUR CLEAR (A) 05/23/2020 1236   LABSPEC 1.008 05/23/2020 1236   PHURINE 8.0 05/23/2020 1236   GLUCOSEU NEGATIVE 05/23/2020 1236   HGBUR SMALL (A) 05/23/2020 1236   BILIRUBINUR NEGATIVE 05/23/2020 1236   KETONESUR NEGATIVE 05/23/2020 1236   PROTEINUR NEGATIVE 05/23/2020 1236   NITRITE NEGATIVE 05/23/2020 1236   LEUKOCYTESUR NEGATIVE 05/23/2020 1236    Radiological Exams on Admission: MR BRAIN WO CONTRAST  Result Date: 05/23/2020 CLINICAL DATA:  Difficulty  speaking, right-sided weakness EXAM: MRI HEAD WITHOUT CONTRAST TECHNIQUE: Multiplanar, multiecho pulse sequences of the brain and surrounding structures  were obtained without intravenous contrast. COMPARISON:  04/25/2020 FINDINGS: Brain: There is no acute infarction or intracranial hemorrhage. There is no intracranial mass, mass effect, or edema. There is no hydrocephalus or extra-axial fluid collection. Ventricles and sulci are within normal limits in size and configuration. Patchy and mildly confluent areas of T2 hyperintensity in the supratentorial much greater than pontine white matter are nonspecific but probably reflect mild to moderate chronic microvascular ischemic changes. Vascular: Major vessel flow voids at the skull base are preserved. Skull and upper cervical spine: Normal marrow signal is preserved. Sinuses/Orbits: Paranasal sinus mucosal thickening. Orbits are unremarkable. Other: Sella is unremarkable.  Minor mastoid fluid opacification. IMPRESSION: No evidence of acute infarction, hemorrhage, or mass. Stable chronic microvascular ischemic changes. Electronically Signed   By: Macy Mis M.D.   On: 05/23/2020 14:47   DG Chest Portable 1 View  Result Date: 05/23/2020 CLINICAL DATA:  Weakness. EXAM: PORTABLE CHEST 1 VIEW COMPARISON:  Single-view of the chest 06/29/2019. FINDINGS: Lungs clear. Heart size normal. No pneumothorax or pleural effusion. No acute or focal bony abnormality. IMPRESSION: No acute disease. Electronically Signed   By: Inge Rise M.D.   On: 05/23/2020 13:02   CT HEAD CODE STROKE WO CONTRAST  Result Date: 05/23/2020 CLINICAL DATA:  Code stroke.  Right-sided weakness and numbness EXAM: CT HEAD WITHOUT CONTRAST TECHNIQUE: Contiguous axial images were obtained from the base of the skull through the vertex without intravenous contrast. COMPARISON:  04/25/2020 FINDINGS: Brain: There is no acute intracranial hemorrhage, mass effect, or edema. No new loss of gray-white  differentiation. Patchy hypoattenuation in the supratentorial white matter likely reflects stable chronic microvascular ischemic changes. Ventricles and sulci are stable in size and configuration. There is no extra-axial collection. Vascular: No definite hyperdense vessel. Areas of increased density likely related to streak artifact. There is intracranial atherosclerotic calcification at the skull base. Skull: Unremarkable. Sinuses/Orbits: No acute abnormality. Other: Mastoid air cells are clear. ASPECTS ([place] Stroke Program Early CT Score) - Ganglionic level infarction (caudate, lentiform nuclei, internal capsule, insula, M1-M3 cortex): 7 - Supraganglionic infarction (M4-M6 cortex): 3 Total score (0-10 with 10 being normal): 10 IMPRESSION: There is no acute intracranial hemorrhage or evidence of acute infarction. ASPECT score is 10. These results were called by telephone at the time of interpretation on 05/23/2020 at 12:06 pm to provider [facility] , who verbally acknowledged these results. Electronically Signed   By: Macy Mis M.D.   On: 05/23/2020 12:09     Assessment/Plan Principal Problem:   TIA (transient ischemic attack) Active Problems:   Parkinson's disease ([place])   History of migraine headaches   Migraine syndrome   BPH (benign prostatic hyperplasia)   Depression     Transient ischemic attack R/O CVA Patient presents to the emergency room via EMS as a code stroke for evaluation of headache, transient confusion and dysarthria all of which have resolved Patient CT scan of the head without contrast is negative for hemorrhage Obtain MRI of the brain to rule out an acute infarct Continue aspirin and high intensity statin Request PT/OT/ST consult Obtain 2D echocardiogram to assess LVEF and rule out cardiac thrombus Consult neurology   History of Parkinson's disease With complications of autonomic instability Continue as needed midodrine Continue Sinemet, amantadine and  entacapone    Migraine syndrome Patient has a remote history of migraine headaches and presents today with complaints of headache mostly behind his right eye similar to his prior migraine attacks Unable to place patient on triptan's due  to concerns for an acute stroke Continue supportive care with Tylenol and Benadryl. May consider NSAIDs if no relief with Tylenol    Depression Continue sertraline    BPH Continue Flomax   DVT prophylaxis: SCD Code Status: full code Family Communication: Greater than 50% of time was spent discussing patient's condition and plan of care with him and his wife at the bedside.  All questions and concerns have been addressed.  They verbalized understanding and agree with the plan. Disposition Plan: Back to previous home environment Consults called: Neurology Status: Observation    Dyanne Yorks MD [facility]     05/23/2020, 2:53 PM

## 2020-05-24 ENCOUNTER — Observation Stay: Payer: Medicare PPO

## 2020-05-24 ENCOUNTER — Observation Stay
Admit: 2020-05-24 | Discharge: 2020-05-24 | Disposition: A | Discharge status: Home / Self Care | Payer: Medicare PPO | Attending: Internal Medicine | Admitting: Internal Medicine

## 2020-05-24 ENCOUNTER — Encounter: Payer: Self-pay | Admitting: Internal Medicine

## 2020-05-24 DIAGNOSIS — G43909 Migraine, unspecified, not intractable, without status migrainosus: Secondary | ICD-10-CM | POA: Diagnosis not present

## 2020-05-24 DIAGNOSIS — G20 Parkinson's disease: Secondary | ICD-10-CM | POA: Diagnosis not present

## 2020-05-24 DIAGNOSIS — R569 Unspecified convulsions: Secondary | ICD-10-CM

## 2020-05-24 DIAGNOSIS — G459 Transient cerebral ischemic attack, unspecified: Secondary | ICD-10-CM | POA: Diagnosis not present

## 2020-05-24 LAB — LIPID PANEL
Cholesterol: 174 mg/dL (ref 0–200)
HDL: 48 mg/dL (ref >40)
LDL Cholesterol: 111 mg/dL — ABNORMAL HIGH (ref 0–99)
Total CHOL/HDL Ratio: 3.6 RATIO
Triglycerides: 75 mg/dL (ref <150)
VLDL: 15 mg/dL (ref 0–40)

## 2020-05-24 LAB — HEMOGLOBIN A1C
Hgb A1c MFr Bld: 5.5 % (ref 4.8–5.6)
Mean Plasma Glucose: 111.15 mg/dL

## 2020-05-24 LAB — CORTISOL-AM, BLOOD: Cortisol - AM: 18.1 ug/dL (ref 6.7–22.6)

## 2020-05-24 LAB — TSH: TSH: 0.679 u[IU]/mL (ref 0.350–4.500)

## 2020-05-24 MED ORDER — CYANOCOBALAMIN 1000 MCG PO TABS: 1000.0000 ug | ORAL_TABLET | Freq: Every day | ORAL | 0 refills | Status: DC

## 2020-05-24 MED ORDER — LEVETIRACETAM 500 MG PO TABS
500.0000 mg | ORAL_TABLET | Freq: Two times a day (BID) | ORAL | Status: DC
  Administered 2020-05-24: 500 mg via ORAL
  Filled 2020-05-24 (×2): qty 1

## 2020-05-24 MED ORDER — ADULT MULTIVITAMIN W/MINERALS CH
1.0000 | ORAL_TABLET | Freq: Every day | ORAL | Status: DC
  Administered 2020-05-24: 12:00:00 1 via ORAL
  Filled 2020-05-24: qty 1

## 2020-05-24 MED ORDER — GADOBUTROL 1 MMOL/ML IV SOLN
7.0000 mL | Freq: Once | INTRAVENOUS | Status: AC | PRN
  Administered 2020-05-24: 7 mL via INTRAVENOUS

## 2020-05-24 MED ORDER — PROSOURCE PLUS PO LIQD
30.0000 mL | Freq: Every day | ORAL | Status: DC
  Filled 2020-05-24: qty 30

## 2020-05-24 MED ORDER — ADULT MULTIVITAMIN W/MINERALS CH: 1.0000 | ORAL_TABLET | Freq: Every day | ORAL | Status: DC

## 2020-05-24 MED ORDER — VITAMIN B-12 1000 MCG PO TABS
1000.0000 ug | ORAL_TABLET | Freq: Every day | ORAL | Status: DC
  Administered 2020-05-24: 1000 ug via ORAL
  Filled 2020-05-24: qty 1

## 2020-05-24 MED ORDER — LEVETIRACETAM 500 MG PO TABS: 500.0000 mg | ORAL_TABLET | Freq: Two times a day (BID) | ORAL | 0 refills | Status: DC

## 2020-05-24 MED ORDER — CYANOCOBALAMIN 1000 MCG/ML IJ SOLN
1000.0000 ug | Freq: Every day | INTRAMUSCULAR | Status: DC
  Filled 2020-05-24: qty 1

## 2020-05-24 MED ORDER — ENSURE ENLIVE PO LIQD
237.0000 mL | ORAL | Status: DC
  Administered 2020-05-24: 237 mL via ORAL

## 2020-05-24 NOTE — Progress Notes (Addendum)
Pt declined a walker.  Per pt he has a walker at home. Dr. Mal Misty and social worker made aware.

## 2020-05-24 NOTE — Evaluation (Addendum)
Physical Therapy Evaluation Patient Details Name: Cory [name] MRN: [id number] DOB: 1955-12-07 Today's Date: 05/24/2020   History of Present Illness  Pt is a 65 y/o M admitted from home on 05/23/20 with code stroke after developing HA behind R eye, dizziness, confusion, R side weakness, & difficulty word finding. Imaging was negative for acute changes. PMH: Parkinson's disease with autonomic instability, asthma, GERD    Clinical Impression  Pt seen for PT evaluation with wife present. Per chart, pt with hx of orthostatic hypotension but no c/o dizziness or adverse symptoms during session. Pt with noticeable full body writhing movements but report this is 2/2 Parkinson's Disease. Pt ambulates without AD with CGA for majority of time with only brief instances of close supervision. Pt demonstrates impaired gait pattern as noted below, with pt ambulating at increased gait speed. Pt willing to ambulate with SPC, verbalizing appropriate way to use AD but only intermittently demonstrating proper use & no true improvement in balance noted. PT educated pt on high fall risk & cites hx of multiple falls, strongly encouraging pt to ambulate with RW but pt declines. PT suggests pt's wife provide physical assistance for mobility but unsure of her ability to do this since she ambulates with [facility] herself. Pt would benefit from OPPT services to focus on high level balance to reduce fall risk & gait training with AD. Pt would benefit from [facility] Test administration or other balance outcome measure to further assess pt's fall risk & for pt education.     Follow Up Recommendations Outpatient PT;Supervision for mobility/OOB    Equipment Recommendations  Rolling walker with 5" wheels    Recommendations for Other Services       Precautions / Restrictions Precautions Precautions: Fall Restrictions Weight Bearing Restrictions: No      Mobility  Bed Mobility Overal bed mobility: Needs Assistance Bed  Mobility: Supine to Sit     Supine to sit: Modified independent (Device/Increase time);HOB elevated          Transfers Overall transfer level: Needs assistance   Transfers: Sit to/from Stand Sit to Stand: Supervision            Ambulation/Gait Ambulation/Gait assistance: Min guard;Supervision Gait Distance (Feet):  (500 ft + 175 ft) Assistive device: None;Straight cane       General Gait Details: scissoring step LLE, decreased LUE swing during gait  Stairs            Wheelchair Mobility    Modified Rankin (Stroke Patients Only)       Balance Overall balance assessment: Needs assistance Sitting-balance support: Bilateral upper extremity supported;Feet supported Sitting balance-Leahy Scale: Good     Standing balance support: No upper extremity supported Standing balance-Leahy Scale: Fair Standing balance comment: CGA throughout gait                             Pertinent Vitals/Pain Pain Assessment: No/denies pain    Home Living Family/patient expects to be discharged to:: Private residence Living Arrangements: Spouse/significant other Available Help at Discharge: Family;Available 24 hours/day Type of Home: House Home Access: Stairs to enter Entrance Stairs-Rails: Right Entrance Stairs-Number of Steps: 3 Home Layout: One level Home Equipment: Cane - single point      Prior Function           Comments: Independent with PRN use of SPC. Pt & wife endorse 10-13 falls in the past 6 months. 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, has a dog ("Zeke")     Hand Dominance        Extremity/Trunk Assessment   Upper Extremity Assessment Upper Extremity Assessment: Overall WFL for tasks assessed (finger to nose equal BUE)    Lower Extremity Assessment Lower Extremity Assessment: Generalized weakness;Overall WFL for tasks assessed (BLE heel to shin equal, denies numbness & tingling)       Communication   Communication: No  difficulties  Cognition Arousal/Alertness: Awake/alert Behavior During Therapy: WFL for tasks assessed/performed Overall Cognitive Status: Within Functional Limits for tasks assessed                                        General Comments      Exercises     Assessment/Plan    PT Assessment Patient needs continued PT services  PT Problem List Decreased mobility;Decreased safety awareness;Decreased coordination;Decreased balance       PT Treatment Interventions DME instruction;Gait training;Patient/family education;Stair training;Balance training;Functional mobility training;Neuromuscular re-education    PT Goals (Current goals can be found in the Care Plan section)  Acute Rehab PT Goals Patient Stated Goal: go home, return to PLOF PT Goal Formulation: With patient Time For Goal Achievement: 06/07/20 Potential to Achieve Goals: Good    Frequency Min 2X/week   Barriers to discharge Decreased caregiver support wife ambulates with cane unsure if she can provide physical assist to pt    Co-evaluation               AM-PAC PT "6 Clicks" Mobility  Outcome Measure Help needed turning from your back to your side while in a flat bed without using bedrails?: None Help needed moving from lying on your back to sitting on the side of a flat bed without using bedrails?: None Help needed moving to and from a bed to a chair (including a wheelchair)?: A Little Help needed standing up from a chair using your arms (e.g., wheelchair or bedside chair)?: None Help needed to walk in hospital room?: A Little Help needed climbing 3-5 steps with a railing? : A Little 6 Click Score: 21    End of Session Equipment Utilized During Treatment: Gait belt Activity Tolerance: Patient tolerated treatment well Patient left: in bed;with bed alarm set;with family/visitor present Nurse Communication: Mobility status PT Visit Diagnosis: Unsteadiness on feet (R26.81);Repeated falls  (R29.6)    Time: 7824-2353 PT Time Calculation (min) (ACUTE ONLY): 29 min   Charges:   PT Evaluation $PT Eval Low Complexity: 1 Low PT Treatments $Therapeutic Activity: 8-22 mins        Lavone Nian, PT, DPT 05/24/20, 12:45 PM   [name] 05/24/2020, 12:36 PM

## 2020-05-24 NOTE — Progress Notes (Signed)
Pt is being discharged home.  Discharge paper given and explained to pt and spouse.  Both verbalized understanding.  Meds and f/u appointment reviewd.  Pt made aware of referral to outpatient PT.

## 2020-05-24 NOTE — Evaluation (Signed)
Occupational Therapy Evaluation Patient Details Name: Cory [name] MRN: [id number] DOB: 08-Jan-1956 Today's Date: 05/24/2020    History of Present Illness Pt is a 65 y/o M admitted from home on 05/23/20 with code stroke after developing HA behind R eye, dizziness, confusion, R side weakness, & difficulty word finding. Imaging was negative for acute changes. PMH: Parkinson's disease with autonomic instability, asthma, GERD   Clinical Impression   Pt seen for OT evaluation this date in setting of acute hospitalization. Pt's spouse present throughout evaluation. Pt reports he is INDEP with self care and fxl mobility at baseline. Has a SPC, but does not use. Pt and spouse report that he seems close to his functional baseline. Upon assessment, pt presents with reasonable strength to complete his daily tasks. Pt does endorse that he takes extended time to perform self care, but this has been true for years since his Pakinson's diagnosis. Pt is somewhat impacted by writhing movements that seem fairly consistent for him (not just with intentional movements), but he is overall able to perform self care ADLs and ADL mobility safely and demos this date that he is at his self-reported baseline. Will complete OT order at this time and do not anticipate need for OT f/u upon d/c.     Follow Up Recommendations  No OT follow up    Equipment Recommendations  None recommended by OT    Recommendations for Other Services       Precautions / Restrictions Restrictions Weight Bearing Restrictions: No      Mobility Bed Mobility Overal bed mobility: Modified Independent                  Transfers Overall transfer level: Modified independent Equipment used: None Transfers: Sit to/from Stand           General transfer comment: while pt is somewhat unsteady, pt and spouse report that he appears at his baseline    Balance Overall balance assessment: Needs assistance Sitting-balance support:  Bilateral upper extremity supported;Feet supported Sitting balance-Leahy Scale: Good     Standing balance support: No upper extremity supported Standing balance-Leahy Scale: Fair Standing balance comment: CGA throughout gait                           ADL either performed or assessed with clinical judgement   ADL Overall ADL's : Modified independent;At baseline                                       General ADL Comments: increased time required, but pt reports this is his baseline for several years now.     Vision Patient Visual Report: No change from baseline       Perception     Praxis      Pertinent Vitals/Pain Pain Assessment: No/denies pain     Hand Dominance Right   Extremity/Trunk Assessment Upper Extremity Assessment Upper Extremity Assessment: Overall WFL for tasks assessed (noted "writhing"-like movement continously, pt appears able to work around this, but is noted to do this consistently, not just with inetentional movements.)   Lower Extremity Assessment Lower Extremity Assessment: Overall WFL for tasks assessed       Communication Communication Communication: No difficulties   Cognition Arousal/Alertness: Awake/alert Behavior During Therapy: WFL for tasks assessed/performed Overall Cognitive Status: Within Functional Limits for tasks assessed  General Comments       Exercises Other Exercises Other Exercises: OT educates pt and spouse re: role of OT in acute setting, both parties with good understanding and familiarity.   Shoulder Instructions      Home Living Family/patient expects to be discharged to:: Private residence Living Arrangements: Spouse/significant other Available Help at Discharge: Family;Available 24 hours/day Type of Home: House Home Access: Stairs to enter [employer] of Steps: 3 Entrance Stairs-Rails: Right Home Layout: One level                Home Equipment: Cane - single point          Prior Functioning/Environment          Comments: Independent with PRN use of SPC. Pt & wife endorse 10-13 falls in the past 6 months. 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, has a dog ("Zeke")        OT Problem List: Decreased activity tolerance      OT Treatment/Interventions: Self-care/ADL training;Therapeutic activities    OT Goals(Current goals can be found in the care plan section) Acute Rehab OT Goals Patient Stated Goal: go home, return to PLOF OT Goal Formulation: All assessment and education complete, DC therapy  OT Frequency:     Barriers to D/C:            Co-evaluation              AM-PAC OT "6 Clicks" Daily Activity     Outcome Measure Help from another person eating meals?: None Help from another person taking care of personal grooming?: None Help from another person toileting, which includes using toliet, bedpan, or urinal?: None Help from another person bathing (including washing, rinsing, drying)?: A Little Help from another person to put on and taking off regular upper body clothing?: None Help from another person to put on and taking off regular lower body clothing?: None 6 Click Score: 23   End of Session    Activity Tolerance: Patient tolerated treatment well Patient left: Other (comment) (seated EOB with spouse present throughout)  OT Visit Diagnosis: Unsteadiness on feet (R26.81)                Time: 3254-9826 OT Time Calculation (min): 10 min Charges:  OT General Charges $OT Visit: 1 Visit OT Evaluation $OT Eval Low Complexity: [place], MS, OTR/L ascom [phone] 05/24/20, 5:15 PM

## 2020-05-24 NOTE — Progress Notes (Signed)
NEUROLOGY CONSULTATION PROGRESS NOTE   Date of service: May 24, 2020 Patient Name: Cory [name] MRN:  [id number] DOB:  [birth year]  Brief HPI   Cory [name] is a 65 y.o. male with PMH significant for Asthma, GERD, Parkinson's disease, migraine who presents with an episode of confusion and Right worse than left weakness. Came in as a stroke code but no tPA or thrombectomy due to resolution of symptoms.   Interval Hx   MRI Brain negative.   rEEG with R hemispheric slowing. I forgot to mention in my initial consult note but I do remember that yesterday in the CT scanner, he had an episode of R eye deviation while I was talking to him. It was odd as I was standing on his left. His head was centered and he was answering my questions but his eyes did show an extreme deviation to the right. Of note today orthostatic vitals were positive. On discussiin, they had never tried non pharmacological recommendations for the orthostatic vitals. He reports that his headache is gone today  Vitals   Vitals:   05/23/20 1230 05/23/20 1945 05/24/20 0427 05/24/20 0739  BP: (!) 154/83 (!) 151/83 123/83 (!) 144/103  Pulse: 81 77 92 84  Resp: (!) 22 13 15 16   Temp:  97.8 F (36.6 C) 97.8 F (36.6 C) (!) 97.5 F (36.4 C)  TempSrc:  Oral Oral Oral  SpO2: 97% 96% 97% 99%  Weight:      Height:         Body mass index is 23.46 kg/m.  Physical Exam   General: Laying comfortably in bed; in no acute distress. HENT: Normal oropharynx and mucosa. Normal external appearance of ears and nose. Neck: Supple, no pain or tenderness CV: No JVD. No peripheral edema. Pulmonary: Symmetric Chest rise. Normal respiratory effort. Abdomen: Soft to touch, non-tender. Ext: No cyanosis, edema, or deformity Skin: No rash. Normal palpation of skin.  Musculoskeletal: Normal digits and nails by inspection. No clubbing.  Neurologic Examination  Mental status/Cognition: Alert, oriented to self, place, month and  year, good attention. Speech/language: Fluent, comprehension intact, object naming intact, repetition intact. Cranial nerves:   CN II Pupils equal and reactive to light, no VF deficits   CN III,IV,VI EOM intact, no gaze preference or deviation, no nystagmus   CN V    CN VII no asymmetry, no nasolabial fold flattening   CN VIII normal hearing to speech   CN IX & X    CN XI    CN XII    Motor:  Muscle bulk: normal, tone oincreased, pronator drift none tremor none Mvmt Root Nerve  Muscle Right Left Comments  SA C5/6 Ax Deltoid     EF C5/6 Mc Biceps 5 5   EE C6/7/8 Rad Triceps 5 5   WF C6/7 Med FCR     WE C7/8 PIN ECU     F Ab C8/T1 U ADM/FDI 5 5   HF L1/2/3 Fem Illopsoas 5 5   KE L2/3/4 Fem Quad     DF L4/5 D Peron Tib Ant     PF S1/2 Tibial Grc/Sol      Reflexes:  Right Left Comments  Pectoralis      Biceps (C5/6)     Brachioradialis (C5/6)      Triceps (C6/7)      Patellar (L3/4)      Achilles (S1)      Hoffman      Plantar  Jaw jerk    Sensation:  Light touch Intact throughout   Pin prick    Temperature    Vibration   Proprioception    Coordination/Complex Motor:  - Finger to Nose intact BL  Labs   Basic Metabolic Panel:  Lab Results  Component Value Date   NA 137 05/23/2020   K 4.0 05/23/2020   CO2 27 05/23/2020   GLUCOSE 95 05/23/2020   BUN 23 05/23/2020   CREATININE 1.39 (H) 05/23/2020   CALCIUM 9.4 05/23/2020   GFRNONAA 56 (L) 05/23/2020   GFRAA 58 (L) 06/29/2019   HbA1c: No results found for: HGBA1C LDL:  Lab Results  Component Value Date   LDLCALC 111 (H) 05/24/2020   Urine Drug Screen:     Component Value Date/Time   LABOPIA NONE DETECTED 05/23/2020 1236   COCAINSCRNUR NONE DETECTED 05/23/2020 1236   LABBENZ NONE DETECTED 05/23/2020 1236   AMPHETMU NONE DETECTED 05/23/2020 1236   THCU NONE DETECTED 05/23/2020 1236   LABBARB NONE DETECTED 05/23/2020 1236    Alcohol Level     Component Value Date/Time   ETH <10 04/25/2020 1221    No results found for: PHENYTOIN, ZONISAMIDE, LAMOTRIGINE, LEVETIRACETA No results found for: PHENYTOIN, PHENOBARB, VALPROATE, CBMZ  Imaging and Diagnostic studies  Results for orders placed during the hospital encounter of 05/23/20  MR BRAIN WO CONTRAST No evidence of acute infarction, hemorrhage, or mass. Stable chronic microvascular ischemic changes.  Impression   Cory [name] is a 65 y.o. male with PMH significant for Asthma, GERD, Parkinson's disease, migraine who presents with an episode of confusion and Right worse than left weakness and an episode of right gaze deviation in the ED. Came in as a stroke code but no tPA or thrombectomy due to resolution of symptoms. His presentation is somewhat atypical but some of the considerations include a potential orthostatis vs focal seizure vs hemiplegic migraine.  Workup with MRI Brain w/o contrast with no abnormalitiy compared to prior MRI in juanuary. rEEG demosntated focal R hemispheric slowing. Given the episode of R eye deviation that I had seen earlier, I will start him on Keppra 500mg  BID. MRI Brain with contrast with no enhancement. Unclear what triggered a potential seizure thou. Sometimes dementia can lead to some focal seizures. Workup also demonstrated low normal Vit B12 levels and started on replacement.  Recommendations  - I ordered Keppra 500mg  BID(discussed potential behavioral side effects of Keppra) - I ordered Vit B12 1094mcg daily PO due to low normal Vit B12 levels. - I think he will benefit from bring on a multivitamin as he might have other vitamin deficiencies too. - Recommend follow up with outpatient neurologist Dr. Manuella Ghazi in [place] [facility] in 2-4 weeks. - Follow up on AM cortisol levels that have been ordered on him outpatient. - Neurology inpatient team will signoff. Please feel free to contact us with any questions or concerns.   Non-Pharmacologic Recs for Orthostatic Hypotension(Please have this in the  discharge paperwork) 1) Lifestyle modification. These measures include:  - Arising slowly, in stages, from supine to seated to standing. This maneuver is most important in the morning, when orthostatic tolerance is lowest.  - Avoiding straining, coughing, and walking in hot weather.These activities reduce venous return and worsen orthostatic hypotension.  - Maintaining hydration and avoiding over-heating.(I recommend getting a camelback water backpack that can be filled with electrolyte water that can be sipped throughout the course of the day)  - Raising the head of the bed 30  to 45 degrees decreases renal perfusion, thereby activating the renin-angiotensin-aldosterone system and decreasing nocturnal diuresis, which can be pronounced in these patients.  These changes relieve orthostatic hypotension by expanding extracellular fluid volume and may reduce end organ damage by reducing supine HTN.  2) Exercise - walking 30 minutes a day, exercise in a swimming pool, exercise in a recumbent or seated position (using a stationary bike or rowing machine)  3) Abdominal binders   4) Compression Stockings  5) Increased salt and water intake to 2 L to 2.5 L of water a day  6) Modification of meals:  - Avoiding large meals  - Ingesting meals low in carbohydrates  - Alcohol should be avoided during the day as it is a vasodilator  - Drink water with meals  - Avoiding activities or sudden standing immediately after eating  7) Physical Countermaneuvers during daily activities: leg crossing, standing on tip toes, squatting  _______________________________________________________________   Thank you for the opportunity to take part in the care of this patient. If you have any further questions, please contact the neurology consultation attending.  Signed,  [place] Pager Number [phone]

## 2020-05-24 NOTE — Progress Notes (Signed)
*  PRELIMINARY RESULTS* Echocardiogram 2D Echocardiogram has been performed.  Cory [name] 05/24/2020, 11:44 AM

## 2020-05-24 NOTE — Progress Notes (Signed)
Initial Nutrition Assessment  RD working remotely.  DOCUMENTATION CODES:   Not applicable  INTERVENTION:  - will order Ensure Enlive once/day, each supplement provides 350 kcal and 20 grams of protein. - will order 30 ml Prosource Plus once/day, each supplement provides 100 kcal and 15 grams protein.  - will order 1 tablet multivitamin with minerals/day. - complete NFPE when feasible.    NUTRITION DIAGNOSIS:   Increased nutrient needs related to acute illness as evidenced by estimated needs.  GOAL:   Patient will meet greater than or equal to 90% of their needs  MONITOR:   PO intake,Supplement acceptance,Labs,Weight trends  REASON FOR ASSESSMENT:   Malnutrition Screening Tool  ASSESSMENT:   65 y.o. male with medical history of Parkinson's disease with autonomic instability, asthma, and GERD. He presented to the ED via EMS as a Code Stroke. He was doing yard work when he developed sudden headache, mainly behind his R eye. He became very dizzy. His wife reported confusion, garbled speech, slow response time, and patient began bumping into things while ambulating.  Patient is OBV status. He is now a/o x4. He ate 90% of dinner last night (660 kcal and 38 grams protein).   Breakfast tray has not yet been delivered but is being prepared, per [facility] review. This meal will provide 533 kcal and 10 grams protein.   Patient denies any changes in appetite PTA and reports that he has not experienced any nausea or abdominal pain or cramping with acute illness.  Weight yesterday was 154 lb and patient feels that he has lost weight over the past 1-2 months with increase in physical activity and sometimes missing meals d/t being busy.    Weight on 04/25/20 was also 154 lb. Prior to that date, the most recently recorded weight was on 06/29/19 when he weighed 160 lb. This indicates 6 lb weight loss (3.8% body weight) in the past 11 months; not significant for time frame.    Labs  reviewed; creatinine: 1.39 mg/dl, GFR: 56 ml/min.  Medications reviewed; 1 g IV Mg sulfate x1 run 2/18.    NUTRITION - FOCUSED PHYSICAL EXAM:  unable to complete at this time.   Diet Order:   Diet Order            Diet 2 gram sodium Room service appropriate? Yes; Fluid consistency: Thin  Diet effective now                 EDUCATION NEEDS:   No education needs have been identified at this time  Skin:  Skin Assessment: Reviewed RN Assessment  Last BM:  2/18  Height:   Ht Readings from Last 1 Encounters:  05/23/20 5\' 8"  (1.727 m)    Weight:   Wt Readings from Last 1 Encounters:  05/23/20 70 kg     Estimated Nutritional Needs:  Kcal:  1900-2100 kcal Protein:  90-105 grams Fluid:  >/= 2.1 L/day      Jarome Matin, MS, RD, LDN, CNSC Inpatient Clinical Dietitian RD pager # available in [facility]  After hours/weekend pager # available in [facility]

## 2020-05-24 NOTE — Discharge Summary (Signed)
Physician Discharge Summary  Cory [name] HWE:[id number] DOB: 11-06-1955 DOA: 05/23/2020  PCP: Adin Hector, MD  Admit date: 05/23/2020 Discharge date: 05/24/2020  Discharge disposition: Home   Recommendations for Outpatient Follow-Up:   Follow-up with neurologist in 2 weeks Outpatient physical therapy recommended   Discharge Diagnosis:   Principal Problem:   Focal seizure ([place]) Active Problems:   Parkinson's disease ([place])   History of migraine headaches   Migraine syndrome   BPH (benign prostatic hyperplasia)   Depression    Discharge Condition: Stable.  Diet recommendation:  Diet Order            Diet general           Diet 2 gram sodium Room service appropriate? Yes; Fluid consistency: Thin  Diet effective now                   Code Status: Full Code     Hospital Course:   Mr. Cory [name] is a 65 year old man with medical history significant for Parkinson's disease, autonomic instability, orthostatic hypotension on midodrine and fludrocortisone, hypertension, asthma, GERD, migraine headache, BPH, depression, CKD stage IIIa.  He presented to the hospital with right retro-orbital headache associated with dizziness and a sensation of almost passing out.  There is associated with confusion and garbled speech.  Reportedly, he had a similar episode on 04/25/2020 and presented to the hospital as a code stroke but acute stroke was ruled out.  He was admitted to the hospital for observation.  CT head and MRI brain did not show any evidence of acute stroke.  Neurologist was consulted to assist with management.  EEG did not show any epileptiform activity but study was "suggestive of cortical dysfunction in the right hemisphere, maximal right temporal region which could be secondary to underlying structural abnormality, postictal state".  The exact cause of his symptoms is not clear but per neurologist, differential diagnosis include focal seizure, orthostatic  hypotension and hemiplegic migraine.  Patient actually had orthostatic hypotension on this hospital visit.  His vitamin B12 level was borderline low at 240.  Neurologist recommended Keppra, vitamin B12 and multivitamins at discharge.  He was evaluated by PT who recommended outpatient physical therapy.  Patient is back to his baseline.  He is deemed stable for discharge to home today.  Discharge plan was discussed with the patient and his wife at the bedside.    Medical Consultants:    Neurologist   Discharge Exam:    Vitals:   05/23/20 1945 05/24/20 0427 05/24/20 0739 05/24/20 1143  BP: (!) 151/83 123/83 (!) 144/103 (!) 156/90  Pulse: 77 92 84 70  Resp: 13 15 16 16   Temp: 97.8 F (36.6 C) 97.8 F (36.6 C) (!) 97.5 F (36.4 C) (!) 97.5 F (36.4 C)  TempSrc: Oral Oral Oral Oral  SpO2: 96% 97% 99% 99%  Weight:      Height:         GEN: NAD SKIN: Warm and dry EYES: EOMI ENT: MMM CV: RRR PULM: CTA B ABD: soft, ND, NT, +BS CNS: AAO x 3, non focal.  Abnormal involuntary movements of the head EXT: No edema or tenderness   The results of significant diagnostics from this hospitalization (including imaging, microbiology, ancillary and laboratory) are listed below for reference.     Procedures and Diagnostic Studies:   MR BRAIN W CONTRAST  Result Date: 05/24/2020 CLINICAL DATA:  Episode of confusion and right greater than left-sided weakness. Abnormal EEG  with evidence of right-sided cortical dysfunction. Evaluate for underlying structural abnormality. EXAM: MRI HEAD WITH CONTRAST TECHNIQUE: Multiplanar, multiecho pulse sequences of the brain and surrounding structures were obtained with intravenous contrast. CONTRAST:  25mL GADAVIST GADOBUTROL 1 MMOL/ML IV SOLN COMPARISON:  Noncontrast head MRI 05/23/2020 FINDINGS: No abnormal brain parenchymal or meningeal enhancement is identified. The major dural venous sinuses and large intracranial arteries are enhancing. Further evaluation  of the brain parenchyma is deferred to yesterday's complete noncontrast examination. IMPRESSION: No abnormal intracranial enhancement. Electronically Signed   By: Logan Bores M.D.   On: 05/24/2020 11:03     Labs:   Basic Metabolic Panel: Recent Labs  Lab 05/23/20 1141  NA 137  K 4.0  CL 101  CO2 27  GLUCOSE 95  BUN 23  CREATININE 1.39*  CALCIUM 9.4   GFR Estimated Creatinine Clearance: 51.3 mL/min (A) (by C-G formula based on SCr of 1.39 mg/dL (H)). Liver Function Tests: Recent Labs  Lab 05/23/20 1141  AST 12*  ALT <5  ALKPHOS 90  BILITOT 0.8  PROT 7.3  ALBUMIN 3.8   No results for input(s): LIPASE, AMYLASE in the last 168 hours. No results for input(s): AMMONIA in the last 168 hours. Coagulation profile Recent Labs  Lab 05/23/20 1141  INR 1.0    CBC: Recent Labs  Lab 05/23/20 1141  WBC 12.2*  NEUTROABS 5.6  HGB 14.0  HCT 42.5  MCV 87.3  PLT 232   Cardiac Enzymes: No results for input(s): CKTOTAL, CKMB, CKMBINDEX, TROPONINI in the last 168 hours. BNP: Invalid input(s): POCBNP CBG: Recent Labs  Lab 05/23/20 1139  GLUCAP 108*   D-Dimer No results for input(s): DDIMER in the last 72 hours. Hgb A1c Recent Labs    05/24/20 0453  HGBA1C 5.5   Lipid Profile Recent Labs    05/24/20 0453  CHOL 174  HDL 48  LDLCALC 111*  TRIG 75  CHOLHDL 3.6   Thyroid function studies Recent Labs    05/24/20 0453  TSH 0.679   Anemia work up Recent Labs    05/23/20 1625  VITAMINB12 240  FOLATE 13.1   Microbiology Recent Results (from the past 240 hour(s))  SARS CORONAVIRUS 2 (TAT 6-24 HRS) Nasopharyngeal Nasopharyngeal Swab     Status: None   Collection Time: 05/23/20 12:36 PM   Specimen: Nasopharyngeal Swab  Result Value Ref Range Status   SARS Coronavirus 2 NEGATIVE NEGATIVE Final    Comment: (NOTE) SARS-CoV-2 target nucleic acids are NOT DETECTED.  The SARS-CoV-2 RNA is generally detectable in upper and lower respiratory specimens during  the acute phase of infection. Negative results do not preclude SARS-CoV-2 infection, do not rule out co-infections with other pathogens, and should not be used as the sole basis for treatment or other patient management decisions. Negative results must be combined with clinical observations, patient history, and epidemiological information. The expected result is Negative.  Fact Sheet for Patients: SugarRoll.be  Fact Sheet for Healthcare Providers: https://www.woods-mathews.com/  This test is not yet approved or cleared by the [place] FDA and  has been authorized for detection and/or diagnosis of SARS-CoV-2 by FDA under an Emergency Use Authorization (EUA). This EUA will remain  in effect (meaning this test can be used) for the duration of the COVID-19 declaration under Se ction 564(b)(1) of the Act, 21 U.S.C. section 360bbb-3(b)(1), unless the authorization is terminated or revoked sooner.  Performed at [facility], [place] [address] [place], [place] [address]   Culture, blood (  routine x 2)     Status: None (Preliminary result)   Collection Time: 05/23/20 12:36 PM   Specimen: BLOOD  Result Value Ref Range Status   Specimen Description BLOOD LEFT ANTECUBITAL  Final   Special Requests   Final    BOTTLES DRAWN AEROBIC AND ANAEROBIC Blood Culture adequate volume   Culture   Final    NO GROWTH < 24 HOURS Performed at [facility], [address] [place], [place] [address]    Report Status PENDING  Incomplete  Culture, blood (routine x 2)     Status: None (Preliminary result)   Collection Time: 05/23/20 12:36 PM   Specimen: BLOOD  Result Value Ref Range Status   Specimen Description BLOOD RIGHT ANTECUBITAL  Final   Special Requests   Final    BOTTLES DRAWN AEROBIC AND ANAEROBIC Blood Culture adequate volume   Culture   Final    NO GROWTH < 24 HOURS Performed at [facility], [address]  [place],  [address]    Report Status PENDING  Incomplete     Discharge Instructions:   Discharge Instructions    Ambulatory referral to Physical Therapy   Complete by: As directed    Diet general   Complete by: As directed    Discharge instructions   Complete by: As directed    Avoid driving, swimming or other high risk activities until cleared by physician to do so.   Non-Pharmacologic RecsforOrthostatic Hypotension(Please have this in the discharge paperwork) 1) Lifestyle modification. These measures include:             - Arising slowly, in stages, from supine to seated to standing. This maneuver is most important in the morning, when orthostatic tolerance is lowest.             - Avoiding straining, coughing, and walking in hot weather.These activities reduce venous return and worsen orthostatic hypotension.             - Maintaining hydration and avoiding over-heating.(I recommend getting a camelback water backpack that can be filled with electrolyte water that can be sipped throughout the course of the day)             - Raising the head of the bed 30 to 45 degrees decreases renal perfusion, thereby activating the renin-angiotensin-aldosterone system and decreasing nocturnal diuresis, which can be pronounced in these patients. These changes relieve orthostatic hypotension by expandingextracellular fluid volume and may reduce end organ damage by reducing supine HTN.             2) Exercise - walking 30 minutes a day, exercise in a swimming pool, exercise in a recumbent or seated position (using a stationary bike or rowing machine)             3) Abdominal binders             4)Compression Stockings             5) Increased salt and water intake to 2 L to 2.5 L of water a day             6) Modification of meals:             - Avoiding large meals             - Ingesting meals low in carbohydrates             - Alcohol should be avoided during the day as it  is a  vasodilator             - Drink water with meals             - Avoiding activities or sudden standing immediately after eating             7) Physical Countermaneuvers during daily activities: leg crossing, standing on tip toes, squatting  _______________________________________________________________   Increase activity slowly   Complete by: As directed      Allergies as of 05/24/2020   No Known Allergies     Medication List    TAKE these medications   Amantadine HCl 100 MG tablet Take 100 mg by mouth 3 (three) times daily.   azelastine 0.1 % nasal spray Commonly known as: ASTELIN Place 1-2 sprays into both nostrils as directed.   carbidopa-levodopa 25-100 MG tablet Commonly known as: SINEMET IR Take 2 tablets by mouth 3 (three) times daily.   carbidopa-levodopa 50-200 MG tablet Commonly known as: SINEMET CR Take 1 tablet by mouth at bedtime.   cyanocobalamin 1000 MCG tablet Take 1 tablet (1,000 mcg total) by mouth daily. Start taking on: May 25, 2020   entacapone 200 MG tablet Commonly known as: COMTAN Take 200 mg by mouth 3 (three) times daily.   fludrocortisone 0.1 MG tablet Commonly known as: FLORINEF Take 100 mcg by mouth every evening.   levETIRAcetam 500 MG tablet Commonly known as: KEPPRA Take 1 tablet (500 mg total) by mouth 2 (two) times daily.   midodrine 2.5 MG tablet Commonly known as: PROAMATINE Take 2.5-5 mg by mouth See admin instructions. Take 5mg  at breakfast (as needed), 2.5mg  at lunch (as needed) and take 2.5mg  at supper (as needed) for low blood pressure   multivitamin with minerals Tabs tablet Take 1 tablet by mouth daily. Start taking on: May 25, 2020   Myrbetriq 50 MG Tb24 tablet Generic drug: mirabegron ER Take 50 mg by mouth at bedtime.   sertraline 50 MG tablet Commonly known as: ZOLOFT Take 50 mg by mouth daily.   tamsulosin 0.4 MG Caps capsule Commonly known as: FLOMAX Take 1 capsule (0.4 mg total) by mouth  daily after supper.   tiZANidine 4 MG tablet Commonly known as: ZANAFLEX Take 4 mg by mouth daily as needed for muscle spasms.   Trelegy Ellipta 100-62.5-25 MCG/INH Aepb Generic drug: Fluticasone-Umeclidin-Vilant Inhale 1 puff into the lungs daily.       Follow-up Information    Vladimir Crofts, MD. Schedule an appointment as soon as possible for a visit in 2 week(s).   Specialty: Neurology Contact information: [place] [facility] [place]  [id number] [phone]                Time coordinating discharge: 35 minutes  Signed:  Jennye Boroughs  [facility] 05/24/2020, 3:18 PM   Pager on [email or web address]. If 7PM-7AM, please contact night-coverage at [email or web address]

## 2020-05-25 LAB — ECHOCARDIOGRAM COMPLETE
AR max vel: 2.74 cm2
AV Peak grad: 5.4 mmHg
Ao pk vel: 1.16 m/s
Area-P 1/2: 4.57 cm2
Height: 68 in
S' Lateral: 3.19 cm
Weight: 2469.15 oz

## 2020-05-28 LAB — CULTURE, BLOOD (ROUTINE X 2)
Culture: NO GROWTH
Culture: NO GROWTH
Special Requests: ADEQUATE
Special Requests: ADEQUATE

## 2020-05-28 LAB — METHYLMALONIC ACID, SERUM: Methylmalonic Acid, Quantitative: 313 nmol/L (ref 0–378)

## 2020-08-05 DIAGNOSIS — R569 Unspecified convulsions: Secondary | ICD-10-CM | POA: Insufficient documentation

## 2020-08-07 ENCOUNTER — Emergency Department
Admission: EM | Admit: 2020-08-07 | Discharge: 2020-08-08 | Disposition: A | Discharge status: Home / Self Care | Payer: Medicare PPO | Attending: Emergency Medicine | Admitting: Emergency Medicine

## 2020-08-07 ENCOUNTER — Other Ambulatory Visit: Payer: Self-pay

## 2020-08-07 ENCOUNTER — Emergency Department: Payer: Medicare PPO

## 2020-08-07 DIAGNOSIS — Z87891 Personal history of nicotine dependence: Secondary | ICD-10-CM | POA: Insufficient documentation

## 2020-08-07 DIAGNOSIS — Z79899 Other long term (current) drug therapy: Secondary | ICD-10-CM | POA: Diagnosis not present

## 2020-08-07 DIAGNOSIS — S299XXA Unspecified injury of thorax, initial encounter: Secondary | ICD-10-CM | POA: Diagnosis present

## 2020-08-07 DIAGNOSIS — W19XXXA Unspecified fall, initial encounter: Secondary | ICD-10-CM | POA: Insufficient documentation

## 2020-08-07 DIAGNOSIS — G20 Parkinson's disease: Secondary | ICD-10-CM | POA: Insufficient documentation

## 2020-08-07 DIAGNOSIS — S20212A Contusion of left front wall of thorax, initial encounter: Secondary | ICD-10-CM | POA: Diagnosis not present

## 2020-08-07 MED ORDER — HYDROCODONE-ACETAMINOPHEN 5-325 MG PO TABS
1.0000 | ORAL_TABLET | Freq: Once | ORAL | Status: AC
  Administered 2020-08-07: 1 via ORAL
  Filled 2020-08-07: qty 1

## 2020-08-07 MED ORDER — OXYCODONE-ACETAMINOPHEN 5-325 MG PO TABS: 1.0000 | ORAL_TABLET | Freq: Four times a day (QID) | ORAL | 0 refills | Status: AC | PRN

## 2020-08-07 MED ORDER — ONDANSETRON 4 MG PO TBDP
4.0000 mg | ORAL_TABLET | Freq: Once | ORAL | Status: AC
  Administered 2020-08-07: 4 mg via ORAL
  Filled 2020-08-07: qty 1

## 2020-08-07 MED ORDER — ONDANSETRON 4 MG PO TBDP: 4.0000 mg | ORAL_TABLET | Freq: Three times a day (TID) | ORAL | 0 refills | Status: AC | PRN

## 2020-08-07 NOTE — Discharge Instructions (Signed)
You can take Percocet for pain. Please use incentive spirometer at home.

## 2020-08-07 NOTE — ED Triage Notes (Signed)
Pt reports falling on his left side today. Pt reports tenderness to L ribcage area with associated shortness of breath. Denies head injury/LOC. Hx of Parkinson's.

## 2020-08-08 NOTE — ED Provider Notes (Signed)
[place]  ____________________________________________  Time seen: Approximately 12:13 AM  I have reviewed the triage vital signs and the nursing notes.   HISTORY  Chief Complaint Fall   Historian Patient    HPI Cory [name] is a 65 y.o. male presents to the emergency department with left-sided rib pain after a mechanical fall.  Patient states that he has reproducible pain with inspiration.  He denies hitting his head or his neck.  No abrasions or lacerations.  No other alleviating measures have been attempted.   Past Medical History:  Diagnosis Date  . Arthritis    hips and back  . Asthma    as a baby-   . GERD (gastroesophageal reflux disease)    rare -   . Parkinson's disease ([place])   . Pneumonia    age 31  . Rash    legs- on going     Immunizations up to date:  Yes.     Past Medical History:  Diagnosis Date  . Arthritis    hips and back  . Asthma    as a baby-   . GERD (gastroesophageal reflux disease)    rare -   . Parkinson's disease ([place])   . Pneumonia    age 46  . Rash    legs- on going    Patient Active Problem List   Diagnosis Date Noted  . Focal seizure ([place]) 05/24/2020  . Parkinson's disease ([place])   . History of migraine headaches   . Migraine syndrome   . BPH (benign prostatic hyperplasia)   . Depression   . Degenerative spondylolisthesis 05/07/2016    Past Surgical History:  Procedure Laterality Date  . APPENDECTOMY    . BACK SURGERY     posterior lumbar spine fusion L4-5  . COLONOSCOPY    . COLONOSCOPY WITH PROPOFOL N/A 06/06/2017   Procedure: COLONOSCOPY WITH PROPOFOL;  Surgeon: Manya Silvas, MD;  [facility] ENDOSCOPY;  Service: Endoscopy;  Laterality: N/A;  . KNEE ARTHROSCOPY Bilateral    ACL  . KNEE ARTHROSCOPY W/ ACL RECONSTRUCTION    . RADIAL KERATOTOMY    . ROTATOR CUFF REPAIR Bilateral   . SHOULDER ARTHROSCOPY WITH BICEPS TENDON REPAIR Left 04/16/2019   Procedure: SHOULDER ARTHROSCOPY  WITH BICEPS TENDON REPAIR, MINI OPEN SUPERIOR CAPSULAR RECONSTRUCTION, BICEPS TENODESIS;  Surgeon: Leim Fabry, MD;  Location: [facility] ORS;  Service: Orthopedics;  Laterality: Left;    Prior to Admission medications   Medication Sig Start Date End Date Taking? Authorizing Provider  ondansetron (ZOFRAN ODT) 4 MG disintegrating tablet Take 1 tablet (4 mg total) by mouth every 8 (eight) hours as needed for up to 5 days. 08/07/20 08/12/20 Yes Vallarie Mare M, PA-C  oxyCODONE-acetaminophen (PERCOCET/ROXICET) 5-325 MG tablet Take 1 tablet by mouth every 6 (six) hours as needed for up to 3 days. 08/07/20 08/10/20 Yes Vallarie Mare M, PA-C  Amantadine HCl 100 MG tablet Take 100 mg by mouth 3 (three) times daily.    [provider]  azelastine (ASTELIN) 0.1 % nasal spray Place 1-2 sprays into both nostrils as directed. 03/27/20   [provider]  carbidopa-levodopa (SINEMET CR) 50-200 MG tablet Take 1 tablet by mouth at bedtime.    [provider]  carbidopa-levodopa (SINEMET IR) 25-100 MG tablet Take 2 tablets by mouth 3 (three) times daily.     [provider]  cyanocobalamin 1000 MCG tablet Take 1 tablet (1,000 mcg total) by mouth daily. 05/25/20   Jennye Boroughs, MD  entacapone (COMTAN) 200 MG tablet Take 200 mg by mouth 3 (three) times daily. 04/17/20   [provider]  fludrocortisone (FLORINEF) 0.1 MG tablet Take 100 mcg by mouth every evening. 04/01/20   [provider]  Fluticasone-Umeclidin-Vilant (TRELEGY ELLIPTA) 100-62.5-25 MCG/INH AEPB Inhale 1 puff into the lungs daily. 06/15/19   [provider]  levETIRAcetam (KEPPRA) 500 MG tablet Take 1 tablet (500 mg total) by mouth 2 (two) times daily. 05/24/20   Jennye Boroughs, MD  midodrine (PROAMATINE) 2.5 MG tablet Take 2.5-5 mg by mouth See admin instructions. Take 5mg  at breakfast (as needed), 2.5mg  at lunch (as needed) and take 2.5mg  at supper (as needed) for low blood pressure 01/28/20   [provider]  Multiple Vitamin (MULTIVITAMIN WITH MINERALS) TABS tablet Take 1 tablet by mouth daily. 05/25/20   Jennye Boroughs, MD  MYRBETRIQ 50 MG TB24 tablet Take 50 mg by mouth at bedtime. 03/06/20   [provider]  sertraline (ZOLOFT) 50 MG tablet Take 50 mg by mouth daily.    [provider]  tamsulosin (FLOMAX) 0.4 MG CAPS capsule Take 1 capsule (0.4 mg total) by mouth daily after supper. 04/08/18   Merlyn Lot, MD  tiZANidine (ZANAFLEX) 4 MG tablet Take 4 mg by mouth daily as needed for muscle spasms.     [provider]    Allergies Patient has no known allergies.  Family History  Problem Relation Age of Onset  . Hypertension Mother     Social History Social History   Tobacco Use  . Smoking status: Former Smoker    Packs/day: 0.25    Years: 23.00    Pack years: 5.75    Types: Cigarettes, Cigars  . Smokeless tobacco: Never Used  . Tobacco comment: smokes 1 cigar per day 04/30/2016  Substance Use Topics  . Alcohol use: Yes    Alcohol/week: 1.0 standard drink    Types: 1 Shots of liquor per week  . Drug use: No     Review of Systems  Constitutional: No fever/chills Eyes:  No discharge ENT: No upper respiratory complaints. Respiratory: no cough. No SOB/ use of accessory muscles to breath Gastrointestinal:   No nausea, no vomiting.  No diarrhea.  No constipation. Musculoskeletal: Patient has left sided rib pain.  Skin: Negative for rash, abrasions, lacerations, ecchymosis.   ____________________________________________   PHYSICAL EXAM:  VITAL SIGNS: ED Triage Vitals  Enc Vitals Group     BP 08/07/20 2058 (!) 145/115     Pulse Rate 08/07/20 2058 76     Resp 08/07/20 2058 20     Temp 08/07/20 2058 98 F (36.7 C)     Temp Source 08/07/20 2058 Oral     SpO2 08/07/20 2058 98 %     Weight 08/07/20 2059 158 lb (71.7 kg)     Height 08/07/20 2059 5\' 7"  (1.702 m)     Head Circumference --      Peak Flow --      Pain Score  08/07/20 2058 9     Pain Loc --      Pain Edu? --      Excl. in [place]? --      Constitutional: Alert and oriented. Well appearing and in no acute distress. Eyes: Conjunctivae are normal. PERRL. EOMI. Head: Atraumatic. ENT:      Nose: No congestion/rhinnorhea.      Mouth/Throat: Mucous membranes are moist.  Neck: No stridor.  No cervical spine tenderness to palpation. Cardiovascular: Normal rate,  regular rhythm. Normal S1 and S2.  Good peripheral circulation. Respiratory: Normal respiratory effort without tachypnea or retractions. Lungs CTAB. Good air entry to the bases with no decreased or absent breath sounds Gastrointestinal: Bowel sounds x 4 quadrants. Soft and nontender to palpation. No guarding or rigidity. No distention. Musculoskeletal: Full range of motion to all extremities. No obvious deformities noted.  Patient has reproducible left-sided rib pain to palpation. Neurologic:  Normal for age. No gross focal neurologic deficits are appreciated.  Skin:  Skin is warm, dry and intact. No rash noted. Psychiatric: Mood and affect are normal for age. Speech and behavior are normal.   ____________________________________________   LABS (all labs ordered are listed, but only abnormal results are displayed)  Labs Reviewed - No data to display ____________________________________________  EKG   ____________________________________________  RADIOLOGY Unk Pinto, personally viewed and evaluated these images (plain radiographs) as part of my medical decision making, as well as reviewing the written report by the radiologist.  DG Ribs Unilateral W/Chest Left  Result Date: 08/07/2020 CLINICAL DATA:  Fall onto left chest today. Left rib pain and shortness of breath. Initial encounter. EXAM: LEFT RIBS AND CHEST - 3+ VIEW COMPARISON:  Chest radiograph on 05/23/2020 FINDINGS: No fracture or other bone lesions are seen involving the ribs. There is no evidence of pneumothorax or pleural  effusion. Both lungs are clear. Heart size and mediastinal contours are within normal limits. IMPRESSION: Negative. Electronically Signed   By: Marlaine Hind M.D.   On: 08/07/2020 21:48    ____________________________________________    PROCEDURES  Procedure(s) performed:     Procedures     Medications  HYDROcodone-acetaminophen (NORCO/VICODIN) 5-325 MG per tablet 1 tablet (1 tablet Oral Given 08/07/20 2251)  ondansetron (ZOFRAN-ODT) disintegrating tablet 4 mg (4 mg Oral Given 08/07/20 2251)  HYDROcodone-acetaminophen (NORCO/VICODIN) 5-325 MG per tablet 1 tablet (1 tablet Oral Given 08/07/20 2345)  ondansetron (ZOFRAN-ODT) disintegrating tablet 4 mg (4 mg Oral Given 08/07/20 2345)     ____________________________________________   INITIAL IMPRESSION / ASSESSMENT AND PLAN / ED COURSE  Pertinent labs & imaging results that were available during my care of the patient were reviewed by me and considered in my medical decision making (see chart for details).      Assessment and plan Chest wall contusion 65 year old male presents to the emergency department with left-sided rib pain after mechanical fall.  Patient had reproducible tenderness to palpation.  No bony abnormalities were visualized on chest x-ray.  No signs of pneumothorax.  Patient was discharged with a short course of Percocet and was supplied an incentive spirometer at discharge.      ____________________________________________  FINAL CLINICAL IMPRESSION(S) / ED DIAGNOSES  Final diagnoses:  Contusion of left chest wall, initial encounter      NEW MEDICATIONS STARTED DURING THIS VISIT:  ED Discharge Orders         Ordered    oxyCODONE-acetaminophen (PERCOCET/ROXICET) 5-325 MG tablet  Every 6 hours PRN        08/07/20 2336    ondansetron (ZOFRAN ODT) 4 MG disintegrating tablet  Every 8 hours PRN        08/07/20 2336              This chart was dictated using voice recognition software/Dragon. Despite  best efforts to proofread, errors can occur which can change the meaning. Any change was purely unintentional.     Lannie Fields, PA-C 08/08/20 0016    Harvest Dark, MD 08/11/20  1509  

## 2020-09-09 ENCOUNTER — Emergency Department: Payer: Medicare PPO

## 2020-09-09 ENCOUNTER — Other Ambulatory Visit: Payer: Self-pay

## 2020-09-09 ENCOUNTER — Encounter: Payer: Self-pay | Admitting: Emergency Medicine

## 2020-09-09 ENCOUNTER — Emergency Department
Admission: EM | Admit: 2020-09-09 | Discharge: 2020-09-09 | Disposition: A | Discharge status: Home / Self Care | Payer: Medicare PPO | Attending: Emergency Medicine | Admitting: Emergency Medicine

## 2020-09-09 DIAGNOSIS — J45909 Unspecified asthma, uncomplicated: Secondary | ICD-10-CM | POA: Insufficient documentation

## 2020-09-09 DIAGNOSIS — Z23 Encounter for immunization: Secondary | ICD-10-CM | POA: Diagnosis not present

## 2020-09-09 DIAGNOSIS — W010XXA Fall on same level from slipping, tripping and stumbling without subsequent striking against object, initial encounter: Secondary | ICD-10-CM | POA: Insufficient documentation

## 2020-09-09 DIAGNOSIS — G20 Parkinson's disease: Secondary | ICD-10-CM | POA: Diagnosis not present

## 2020-09-09 DIAGNOSIS — Z87891 Personal history of nicotine dependence: Secondary | ICD-10-CM | POA: Insufficient documentation

## 2020-09-09 DIAGNOSIS — S0181XA Laceration without foreign body of other part of head, initial encounter: Secondary | ICD-10-CM | POA: Diagnosis not present

## 2020-09-09 DIAGNOSIS — W19XXXA Unspecified fall, initial encounter: Secondary | ICD-10-CM

## 2020-09-09 DIAGNOSIS — S0990XA Unspecified injury of head, initial encounter: Secondary | ICD-10-CM | POA: Diagnosis present

## 2020-09-09 DIAGNOSIS — Y92094 Garage of other non-institutional residence as the place of occurrence of the external cause: Secondary | ICD-10-CM | POA: Diagnosis not present

## 2020-09-09 MED ORDER — TETANUS-DIPHTH-ACELL PERTUSSIS 5-2.5-18.5 LF-MCG/0.5 IM SUSY
0.5000 mL | PREFILLED_SYRINGE | Freq: Once | INTRAMUSCULAR | Status: AC
  Administered 2020-09-09: 0.5 mL via INTRAMUSCULAR
  Filled 2020-09-09: qty 0.5

## 2020-09-09 MED ORDER — LIDOCAINE-EPINEPHRINE (PF) 2 %-1:200000 IJ SOLN
10.0000 mL | Freq: Once | INTRAMUSCULAR | Status: AC
  Administered 2020-09-09: 10 mL
  Filled 2020-09-09: qty 20

## 2020-09-09 NOTE — ED Provider Notes (Signed)
[facility] Emergency Department Provider Note  ____________________________________________  Time seen: Approximately 3:13 PM  I have reviewed the triage vital signs and the nursing notes.   HISTORY  Chief Complaint Fall    HPI Cory [name] is a 65 y.o. male who presents the emergency department for complaint of a mechanical fall sustaining injury to the head.  Patient states that he has a history of Parkinson's and does have some balance issues and has had some falls from same.  Today patient actually tripped, falling forward and hitting his head.  He sustained a laceration to the forehead.  No loss of consciousness.  He denies any headache, visual changes, neck pain, unilateral weakness, difficulty formulating thoughts or words.  He is at his baseline according to his wife who is present in the emergency department at this time.  Patient is unsure his last tetanus shot.         Past Medical History:  Diagnosis Date  . Arthritis    hips and back  . Asthma    as a baby-   . GERD (gastroesophageal reflux disease)    rare -   . Parkinson's disease ([place])   . Pneumonia    age 56  . Rash    legs- on going    Patient Active Problem List   Diagnosis Date Noted  . 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 Parkinson's disease ([place])   . History of migraine headaches   . Migraine syndrome   . BPH (benign prostatic hyperplasia)   . Depression   . Degenerative spondylolisthesis 05/07/2016    Past Surgical History:  Procedure Laterality Date  . APPENDECTOMY    . BACK SURGERY     posterior lumbar spine fusion L4-5  . COLONOSCOPY    . COLONOSCOPY WITH PROPOFOL N/A 06/06/2017   Procedure: COLONOSCOPY WITH PROPOFOL;  Surgeon: Manya Silvas, MD;  Location: [facility] ENDOSCOPY;  Service: Endoscopy;  Laterality: N/A;  . KNEE ARTHROSCOPY Bilateral    ACL  . KNEE ARTHROSCOPY W/ ACL RECONSTRUCTION    . RADIAL KERATOTOMY    . ROTATOR CUFF REPAIR Bilateral   .  SHOULDER ARTHROSCOPY WITH BICEPS TENDON REPAIR Left 04/16/2019   Procedure: SHOULDER ARTHROSCOPY WITH BICEPS TENDON REPAIR, MINI OPEN SUPERIOR CAPSULAR RECONSTRUCTION, BICEPS TENODESIS;  Surgeon: Leim Fabry, MD;  Location: [facility] ORS;  Service: Orthopedics;  Laterality: Left;    Prior to Admission medications   Medication Sig Start Date End Date Taking? Authorizing Provider  Amantadine HCl 100 MG tablet Take 100 mg by mouth 3 (three) times daily.    [provider]  azelastine (ASTELIN) 0.1 % nasal spray Place 1-2 sprays into both nostrils as directed. 03/27/20   [provider]  carbidopa-levodopa (SINEMET CR) 50-200 MG tablet Take 1 tablet by mouth at bedtime.    [provider]  carbidopa-levodopa (SINEMET IR) 25-100 MG tablet Take 2 tablets by mouth 3 (three) times daily.     [provider]  cyanocobalamin 1000 MCG tablet Take 1 tablet (1,000 mcg total) by mouth daily. 05/25/20   Jennye Boroughs, MD  entacapone (COMTAN) 200 MG tablet Take 200 mg by mouth 3 (three) times daily. 04/17/20   [provider]  fludrocortisone (FLORINEF) 0.1 MG tablet Take 100 mcg by mouth every evening. 04/01/20   [provider]  Fluticasone-Umeclidin-Vilant (TRELEGY ELLIPTA) 100-62.5-25 MCG/INH AEPB Inhale 1 puff into the lungs daily. 06/15/19   [provider]  levETIRAcetam (KEPPRA) 500 MG tablet Take 1  tablet (500 mg total) by mouth 2 (two) times daily. 05/24/20   Jennye Boroughs, MD  midodrine (PROAMATINE) 2.5 MG tablet Take 2.5-5 mg by mouth See admin instructions. Take 5mg  at breakfast (as needed), 2.5mg  at lunch (as needed) and take 2.5mg  at supper (as needed) for low blood pressure 01/28/20   [provider]  Multiple Vitamin (MULTIVITAMIN WITH MINERALS) TABS tablet Take 1 tablet by mouth daily. 05/25/20   Jennye Boroughs, MD  MYRBETRIQ 50 MG TB24 tablet Take 50 mg by mouth at bedtime. 03/06/20   [provider]  sertraline (ZOLOFT) 50  MG tablet Take 50 mg by mouth daily.    [provider]  tamsulosin (FLOMAX) 0.4 MG CAPS capsule Take 1 capsule (0.4 mg total) by mouth daily after supper. 04/08/18   Merlyn Lot, MD  tiZANidine (ZANAFLEX) 4 MG tablet Take 4 mg by mouth daily as needed for muscle spasms.     [provider]    Allergies Patient has no known allergies.  Family History  Problem Relation Age of Onset  . Hypertension Mother     Social History Social History   Tobacco Use  . Smoking status: Former Smoker    Packs/day: 0.25    Years: 23.00    Pack years: 5.75    Types: Cigarettes, Cigars  . Smokeless tobacco: Never Used  . Tobacco comment: smokes 1 cigar per day 04/30/2016  Substance Use Topics  . Alcohol use: Yes    Alcohol/week: 1.0 standard drink    Types: 1 Shots of liquor per week  . Drug use: No     Review of Systems  Constitutional: No fever/chills Eyes: No visual changes. No discharge ENT: No upper respiratory complaints. Cardiovascular: no chest pain. Respiratory: no cough. No SOB. Gastrointestinal: No abdominal pain.  No nausea, no vomiting.  No diarrhea.  No constipation. Musculoskeletal: Negative for musculoskeletal pain. Skin: Forehead laceration secondary to fall Neurological: Negative for headaches, focal weakness or numbness.  10 System ROS otherwise negative.  ____________________________________________   PHYSICAL EXAM:  VITAL SIGNS: ED Triage Vitals  Enc Vitals Group     BP 09/09/20 1457 (!) 151/88     Pulse Rate 09/09/20 1457 82     Resp 09/09/20 1457 16     Temp 09/09/20 1457 97.8 F (36.6 C)     Temp Source 09/09/20 1457 Oral     SpO2 09/09/20 1457 99 %     Weight 09/09/20 1455 158 lb 1.1 oz (71.7 kg)     Height 09/09/20 1455 5\' 7"  (1.702 m)     Head Circumference --      Peak Flow --      Pain Score 09/09/20 1455 0     Pain Loc --      Pain Edu? --      Excl. in [place]? --      Constitutional: Alert and oriented. Well appearing  and in no acute distress. Eyes: Conjunctivae are normal. PERRL. EOMI. Head: Visualization of the forehead reveals L-shaped laceration measuring 1 cm on each of the legs.  No active bleeding.  No visible foreign body.  Palpation is tender over the laceration itself but not to the underlying osseous structures.  No palpable abnormality or crepitus.  No battle signs, raccoon eyes, serosanguineous fluid drainage from the ears or nares. ENT:      Ears:       Nose: No congestion/rhinnorhea.      Mouth/Throat: Mucous membranes are moist.  Neck: No  stridor.  No cervical spine tenderness to palpation.  Cardiovascular: Normal rate, regular rhythm. Normal S1 and S2.  Good peripheral circulation. Respiratory: Normal respiratory effort without tachypnea or retractions. Lungs CTAB. Good air entry to the bases with no decreased or absent breath sounds. Musculoskeletal: Full range of motion to all extremities. No gross deformities appreciated. Neurologic:  Normal speech and language. No gross focal neurologic deficits are appreciated.  Patient with appreciable tremor secondary to Parkinson's otherwise cranial nerves II to XII grossly intact. Skin:  Skin is warm, dry and intact. No rash noted. Psychiatric: Mood and affect are normal. Speech and behavior are normal. Patient exhibits appropriate insight and judgement.   ____________________________________________   LABS (all labs ordered are listed, but only abnormal results are displayed)  Labs Reviewed - No data to display ____________________________________________  EKG   ____________________________________________  RADIOLOGY I personally viewed and evaluated these images as part of my medical decision making, as well as reviewing the written report by the radiologist.  ED Provider Interpretation: Visualization of the CT scan revealed no acute intracranial or osseous abnormality about the head or neck.  Soft tissue injury consistent with  visualized laceration.  CT Head Wo Contrast  Result Date: 09/09/2020 CLINICAL DATA:  Polytrauma, critical, head/C-spine injury suspected Struck head on concrete floor, laceration above right eyebrow. EXAM: CT HEAD WITHOUT CONTRAST TECHNIQUE: Contiguous axial images were obtained from the base of the skull through the vertex without intravenous contrast. COMPARISON:  Head CT 05/23/2020 FINDINGS: Brain: Stable brain volume. No intracranial hemorrhage, mass effect, or midline shift. No hydrocephalus. The basilar cisterns are patent. Stable chronic small vessel ischemic change. No evidence of territorial infarct or acute ischemia. No extra-axial or intracranial fluid collection. Vascular: Atherosclerosis of skullbase vasculature without hyperdense vessel or abnormal calcification. Skull: No fracture or focal lesion. Sinuses/Orbits: Right supraorbital laceration and small hematoma. No orbital or facial bone fracture. Mild mucosal thickening of ethmoid air cells. The mastoid air cells are clear. Included orbits are unremarkable. Other: None. IMPRESSION: 1. Right supraorbital laceration and small hematoma. No acute intracranial abnormality. No skull fracture. 2. Stable chronic small vessel ischemic change. Electronically Signed   By: Keith Rake M.D.   On: 09/09/2020 16:17   CT Cervical Spine Wo Contrast  Result Date: 09/09/2020 CLINICAL DATA:  Polytrauma, critical, head/C-spine injury suspected Struck head on concrete floor. EXAM: CT CERVICAL SPINE WITHOUT CONTRAST TECHNIQUE: Multidetector CT imaging of the cervical spine was performed without intravenous contrast. Multiplanar CT image reconstructions were also generated. COMPARISON:  None. FINDINGS: Alignment: Straightening of normal lordosis. There is slight scoliotic curvature. No traumatic subluxation. Skull base and vertebrae: No acute fracture. Vertebral body heights are maintained. The dens and skull base are intact. Soft tissues and spinal canal: No  prevertebral fluid or swelling. No visible canal hematoma. Disc levels: Disc space narrowing and endplate spurring B3-A1 through C7-T1. There is scattered facet hypertrophy. No high-grade canal stenosis. Upper chest: Heterogeneous prominent thyroid gland but no discrete nodule. Clear lung apices. Other: None. IMPRESSION: Degenerative change in the cervical spine without acute fracture or subluxation. Electronically Signed   By: Keith Rake M.D.   On: 09/09/2020 16:21    ____________________________________________    PROCEDURES  Procedure(s) performed:    Marland KitchenMarland KitchenLaceration Repair  Date/Time: 09/09/2020 5:19 PM Performed by: Darletta Moll, PA-C Authorized by: Darletta Moll, PA-C   Consent:    Consent obtained:  Verbal   Consent given by:  Patient   Risks, benefits, and alternatives were discussed:  yes     Risks discussed:  Infection, pain and poor cosmetic result Universal protocol:    Procedure explained and questions answered to patient or proxy's satisfaction: yes     Patient identity confirmed:  Verbally with patient Anesthesia:    Anesthesia method:  Local infiltration   Local anesthetic:  Lidocaine 1% WITH epi Laceration details:    Location:  Face   Face location:  Forehead   Length (cm):  4 Pre-procedure details:    Preparation:  Imaging obtained to evaluate for foreign bodies Exploration:    Hemostasis achieved with:  Direct pressure and epinephrine   Imaging outcome: foreign body not noted     Wound exploration: wound explored through full range of motion and entire depth of wound visualized     Wound extent: no foreign bodies/material noted, no muscle damage noted and no underlying fracture noted     Contaminated: no   Treatment:    Area cleansed with:  Povidone-iodine   Amount of cleaning:  Standard   Irrigation solution:  Sterile saline   Irrigation volume:  1L   Irrigation method:  Syringe Skin repair:    Repair method:  Sutures   Suture  size:  4-0   Suture material:  Nylon   Suture technique:  Simple interrupted   Number of sutures:  6 Approximation:    Approximation:  Close Repair type:    Repair type:  Simple Post-procedure details:    Dressing:  Open (no dressing)   Procedure completion:  Tolerated well, no immediate complications      Medications  lidocaine-EPINEPHrine (XYLOCAINE W/EPI) 2 %-1:200000 (PF) injection 10 mL (has no administration in time range)  Tdap (BOOSTRIX) injection 0.5 mL (0.5 mLs Intramuscular Given 09/09/20 1539)     ____________________________________________   INITIAL IMPRESSION / ASSESSMENT AND PLAN / ED COURSE  Pertinent labs & imaging results that were available during my care of the patient were reviewed by me and considered in my medical decision making (see chart for details).  Review of the [place] CSRS was performed in accordance of the [place] prior to dispensing any controlled drugs.           Patient's diagnosis is consistent with fall, facial laceration.  Patient presented to the emergency department after a mechanical fall, falling forward and striking his forehead.  Patient sustained a laceration to the right forehead.  This was closed as described above.  Wound care instructions discussed with the patient and wife.  Given the mechanism imaging of the head and neck were obtained.  These returned without acute traumatic findings.  W wound is closed as described above.  Patient tolerated well with no complications.  Follow-up primary care in 1 week for suture removal..  Patient is given ED precautions to return to the ED for any worsening or new symptoms.     ____________________________________________  FINAL CLINICAL IMPRESSION(S) / ED DIAGNOSES  Final diagnoses:  Fall, initial encounter  Laceration of forehead, initial encounter      NEW MEDICATIONS STARTED DURING THIS VISIT:  ED Discharge Orders    None          This chart was dictated using voice  recognition software/Dragon. Despite best efforts to proofread, errors can occur which can change the meaning. Any change was purely unintentional.    Darletta Moll, PA-C 09/09/20 1726    Nance Pear, MD 09/09/20 1816

## 2020-09-09 NOTE — ED Notes (Signed)
See triage note. History of parkinson's and seizures per pt. Hit head on floor; denies nausea, blurred vision, balance issues, confusion. Speech clear. Family at bedside. Lac above R eyebrow at forehead. Bleeding controlled with gauze; deep.

## 2020-09-09 NOTE — ED Notes (Signed)
Pt taken out via wheelchair

## 2020-09-09 NOTE — ED Triage Notes (Signed)
Tripped and fell in garage today.  Hit head on concrete floor.  Laceration to right forehead.  No LOC>  Bleeding controlled.  Hx. parkinson's

## 2020-09-15 ENCOUNTER — Emergency Department: Payer: Medicare PPO

## 2020-09-15 ENCOUNTER — Emergency Department
Admission: EM | Admit: 2020-09-15 | Discharge: 2020-09-15 | Disposition: A | Discharge status: Home / Self Care | Payer: Medicare PPO | Attending: Emergency Medicine | Admitting: Emergency Medicine

## 2020-09-15 ENCOUNTER — Other Ambulatory Visit: Payer: Self-pay

## 2020-09-15 DIAGNOSIS — W01198A Fall on same level from slipping, tripping and stumbling with subsequent striking against other object, initial encounter: Secondary | ICD-10-CM | POA: Insufficient documentation

## 2020-09-15 DIAGNOSIS — Z79899 Other long term (current) drug therapy: Secondary | ICD-10-CM | POA: Insufficient documentation

## 2020-09-15 DIAGNOSIS — G20 Parkinson's disease: Secondary | ICD-10-CM | POA: Diagnosis not present

## 2020-09-15 DIAGNOSIS — J45909 Unspecified asthma, uncomplicated: Secondary | ICD-10-CM | POA: Insufficient documentation

## 2020-09-15 DIAGNOSIS — Z4802 Encounter for removal of sutures: Secondary | ICD-10-CM | POA: Insufficient documentation

## 2020-09-15 DIAGNOSIS — Z87891 Personal history of nicotine dependence: Secondary | ICD-10-CM | POA: Diagnosis not present

## 2020-09-15 DIAGNOSIS — W19XXXA Unspecified fall, initial encounter: Secondary | ICD-10-CM

## 2020-09-15 DIAGNOSIS — R296 Repeated falls: Secondary | ICD-10-CM | POA: Insufficient documentation

## 2020-09-15 LAB — COMPREHENSIVE METABOLIC PANEL
ALT: <5 U/L (ref 0–44)
AST: 16 U/L (ref 15–41)
Albumin: 4.2 g/dL (ref 3.5–5.0)
Alkaline Phosphatase: 88 U/L (ref 38–126)
Anion gap: 8 (ref 5–15)
BUN: 17 mg/dL (ref 8–23)
CO2: 26 mmol/L (ref 22–32)
Calcium: 9.3 mg/dL (ref 8.9–10.3)
Chloride: 103 mmol/L (ref 98–111)
Creatinine, Ser: 1.25 mg/dL — ABNORMAL HIGH (ref 0.61–1.24)
GFR, Estimated: >60 mL/min (ref >60)
Glucose, Bld: 126 mg/dL — ABNORMAL HIGH (ref 70–99)
Potassium: 3.9 mmol/L (ref 3.5–5.1)
Sodium: 137 mmol/L (ref 135–145)
Total Bilirubin: 0.7 mg/dL (ref 0.3–1.2)
Total Protein: 7.7 g/dL (ref 6.5–8.1)

## 2020-09-15 LAB — URINALYSIS, COMPLETE (UACMP) WITH MICROSCOPIC
Bacteria, UA: NONE SEEN
Bilirubin Urine: NEGATIVE
Glucose, UA: NEGATIVE mg/dL
Ketones, ur: 5 mg/dL — AB
Leukocytes,Ua: NEGATIVE
Nitrite: NEGATIVE
Protein, ur: NEGATIVE mg/dL
Specific Gravity, Urine: 1.011 (ref 1.005–1.030)
Squamous Epithelial / HPF: NONE SEEN (ref 0–5)
pH: 6 (ref 5.0–8.0)

## 2020-09-15 LAB — CBC WITH DIFFERENTIAL/PLATELET
Abs Immature Granulocytes: 0.04 10*3/uL (ref 0.00–0.07)
Basophils Absolute: 0.1 10*3/uL (ref 0.0–0.1)
Basophils Relative: 1 %
Eosinophils Absolute: 4.3 10*3/uL — ABNORMAL HIGH (ref 0.0–0.5)
Eosinophils Relative: 31 %
HCT: 43.8 % (ref 39.0–52.0)
Hemoglobin: 14.6 g/dL (ref 13.0–17.0)
Immature Granulocytes: 0 %
Lymphocytes Relative: 13 %
Lymphs Abs: 1.7 10*3/uL (ref 0.7–4.0)
MCH: 29.8 pg (ref 26.0–34.0)
MCHC: 33.3 g/dL (ref 30.0–36.0)
MCV: 89.4 fL (ref 80.0–100.0)
Monocytes Absolute: 0.9 10*3/uL (ref 0.1–1.0)
Monocytes Relative: 7 %
Neutro Abs: 6.7 10*3/uL (ref 1.7–7.7)
Neutrophils Relative %: 48 %
Platelets: 218 10*3/uL (ref 150–400)
RBC: 4.9 MIL/uL (ref 4.22–5.81)
RDW: 13.8 % (ref 11.5–15.5)
Smear Review: UNDETERMINED
WBC: 13.8 10*3/uL — ABNORMAL HIGH (ref 4.0–10.5)
nRBC: 0 % (ref 0.0–0.2)

## 2020-09-15 MED ORDER — LIDOCAINE-EPINEPHRINE-TETRACAINE (LET) TOPICAL GEL
3.0000 mL | Freq: Once | TOPICAL | Status: AC
  Administered 2020-09-15: 3 mL via TOPICAL
  Filled 2020-09-15: qty 3

## 2020-09-15 NOTE — ED Provider Notes (Signed)
[facility]-EMERGENCY DEPARTMENT  ____________________________________________  Time seen: Approximately 3:41 PM  I have reviewed the triage vital signs and the nursing notes.   HISTORY  Chief Complaint Fall   Patient    HPI Cory [name] is a 65 y.o. male with a history of Parkinson's disease, presents to the emergency department with increased falls over the past 3 weeks.  Patient has had 6 falls in the past 3 weeks.  Patient lost his balance today and hit the back of his head against a brick.  He did not lose consciousness.  He denies neck pain.  No numbness or tingling in the upper and lower extremities.  No weakness in the upper or lower extremities.  No chest pain, chest tightness or abdominal pain.  Patient reports that he ambulates either with a walker or canes.   Past Medical History:  Diagnosis Date   Arthritis    hips and back   Asthma    as a baby-    GERD (gastroesophageal reflux disease)    rare -    Parkinson's disease ([place])    Pneumonia    age 25   Rash    legs- on going     Immunizations up to date:  Yes.     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   Parkinson's disease ([place])    History of migraine headaches    Migraine syndrome    BPH (benign prostatic hyperplasia)    Depression    Degenerative spondylolisthesis 05/07/2016    Past Surgical History:  Procedure Laterality Date   APPENDECTOMY     BACK SURGERY     posterior lumbar spine fusion L4-5   COLONOSCOPY     COLONOSCOPY WITH PROPOFOL N/A 06/06/2017   Procedure: COLONOSCOPY WITH PROPOFOL;  Surgeon: Manya Silvas, MD;  Location: [facility] ENDOSCOPY;  Service: Endoscopy;  Laterality: N/A;   KNEE ARTHROSCOPY Bilateral     ACL   KNEE ARTHROSCOPY W/ ACL RECONSTRUCTION     RADIAL KERATOTOMY     ROTATOR CUFF REPAIR Bilateral    SHOULDER ARTHROSCOPY WITH BICEPS TENDON REPAIR Left 04/16/2019   Procedure: SHOULDER ARTHROSCOPY WITH BICEPS TENDON REPAIR, MINI OPEN SUPERIOR CAPSULAR RECONSTRUCTION, BICEPS TENODESIS;  Surgeon: Leim Fabry, MD;  Location: [facility] ORS;  Service: Orthopedics;  Laterality: Left;    Prior to Admission medications   Medication Sig Start Date End Date Taking? Authorizing Provider  Amantadine HCl 100 MG tablet Take 100 mg by mouth 3 (three) times daily.    [provider]  azelastine (ASTELIN) 0.1 % nasal spray Place 1-2 sprays into both nostrils as directed. 03/27/20   [provider]  carbidopa-levodopa (SINEMET CR) 50-200 MG tablet Take 1 tablet by mouth at bedtime.    [provider]  carbidopa-levodopa (SINEMET IR) 25-100 MG tablet Take 2 tablets by mouth 3 (three) times daily.     [provider]  cyanocobalamin 1000 MCG tablet Take 1 tablet (1,000 mcg total) by mouth daily. 05/25/20   Jennye Boroughs, MD  entacapone (COMTAN) 200 MG tablet Take 200 mg by mouth 3 (  three) times daily. 04/17/20   [provider]  fludrocortisone (FLORINEF) 0.1 MG tablet Take 100 mcg by mouth every evening. 04/01/20   [provider]  Fluticasone-Umeclidin-Vilant (TRELEGY ELLIPTA) 100-62.5-25 MCG/INH AEPB Inhale 1 puff into the lungs daily. 06/15/19   [provider]  levETIRAcetam (KEPPRA) 500 MG tablet Take 1 tablet (500 mg total) by mouth 2 (two) times daily. 05/24/20   Jennye Boroughs, MD  midodrine (PROAMATINE) 2.5 MG tablet Take 2.5-5 mg by mouth See admin instructions. Take 5mg  at breakfast (as needed), 2.5mg  at lunch (as needed) and take 2.5mg  at supper (as needed) for low blood pressure 01/28/20   [provider]  Multiple Vitamin (MULTIVITAMIN WITH MINERALS) TABS tablet Take 1 tablet by mouth daily. 05/25/20   Jennye Boroughs, MD  MYRBETRIQ  50 MG TB24 tablet Take 50 mg by mouth at bedtime. 03/06/20   [provider]  sertraline (ZOLOFT) 50 MG tablet Take 50 mg by mouth daily.    [provider]  tamsulosin (FLOMAX) 0.4 MG CAPS capsule Take 1 capsule (0.4 mg total) by mouth daily after supper. 04/08/18   Merlyn Lot, MD  tiZANidine (ZANAFLEX) 4 MG tablet Take 4 mg by mouth daily as needed for muscle spasms.     [provider]    Allergies Patient has no known allergies.  Family History  Problem Relation Age of Onset   Hypertension Mother     Social History Social History   Tobacco Use   Smoking status: Former    Packs/day: 0.25    Years: 23.00    Pack years: 5.75    Types: Cigarettes, Cigars   Smokeless tobacco: Never   Tobacco comments:    smokes 1 cigar per day 04/30/2016  Substance Use Topics   Alcohol use: Yes    Alcohol/week: 1.0 standard drink    Types: 1 Shots of liquor per week   Drug use: No     Review of Systems  Constitutional: No fever/chills Eyes:  No discharge ENT: No upper respiratory complaints. Respiratory: no cough. No SOB/ use of accessory muscles to breath Gastrointestinal:   No nausea, no vomiting.  No diarrhea.  No constipation. Musculoskeletal: Negative for musculoskeletal pain. Skin: Patient has occipital abrasions.    ____________________________________________   PHYSICAL EXAM:  VITAL SIGNS: ED Triage Vitals  Enc Vitals Group     BP 09/15/20 1434 (!) 156/91     Pulse Rate 09/15/20 1434 73     Resp 09/15/20 1434 19     Temp 09/15/20 1434 98 F (36.7 C)     Temp src --      SpO2 09/15/20 1434 97 %     Weight --      Height --      Head Circumference --      Peak Flow --      Pain Score 09/15/20 1433 4     Pain Loc --      Pain Edu? --      Excl. in [place]? --      Constitutional: Alert and oriented. Well appearing and in no acute distress. Eyes: Conjunctivae are normal. PERRL. EOMI. Head: Atraumatic.  No palpable hematomas. ENT:       Nose: No congestion/rhinnorhea.      Mouth/Throat: Mucous membranes are moist.  Neck: No stridor.  FROM.  No midline C-spine tenderness to palpation. Cardiovascular: Normal rate, regular rhythm. Normal S1 and S2.  Good peripheral circulation. Respiratory: Normal respiratory effort without tachypnea or  retractions. Lungs CTAB. Good air entry to the bases with no decreased or absent breath sounds Gastrointestinal: Bowel sounds x 4 quadrants. Soft and nontender to palpation. No guarding or rigidity. No distention. Musculoskeletal: Full range of motion to all extremities. No obvious deformities noted Neurologic:  Normal for age. No gross focal neurologic deficits are appreciated.  Skin: Patient has occipital abrasions.  No full-thickness scalp lacerations. Psychiatric: Mood and affect are normal for age. Speech and behavior are normal.   ____________________________________________   LABS (all labs ordered are listed, but only abnormal results are displayed)  Labs Reviewed  CBC WITH DIFFERENTIAL/PLATELET - Abnormal; Notable for the following components:      Result Value   WBC 13.8 (*)    Eosinophils Absolute 4.3 (*)    All other components within normal limits  COMPREHENSIVE METABOLIC PANEL - Abnormal; Notable for the following components:   Glucose, Bld 126 (*)    Creatinine, Ser 1.25 (*)    All other components within normal limits  URINALYSIS, COMPLETE (UACMP) WITH MICROSCOPIC - Abnormal; Notable for the following components:   Color, Urine AMBER (*)    APPearance CLEAR (*)    Hgb urine dipstick MODERATE (*)    Ketones, ur 5 (*)    All other components within normal limits  PATHOLOGIST SMEAR REVIEW   ____________________________________________  EKG   ____________________________________________  RADIOLOGY Unk Pinto, personally viewed and evaluated these images (plain radiographs) as part of my medical decision making, as well as reviewing the written report by the  radiologist.    CT Head Wo Contrast  Result Date: 09/15/2020 CLINICAL DATA:  [place] today, history Parkinson's disease, fell striking back of head, laceration with controlled bleeding, no loss of consciousness, bruising at RIGHT eye from a fall 1 week ago EXAM: CT HEAD WITHOUT CONTRAST CT CERVICAL SPINE WITHOUT CONTRAST TECHNIQUE: Multidetector CT imaging of the head and cervical spine was performed following the standard protocol without intravenous contrast. Multiplanar CT image reconstructions of the cervical spine were also generated. COMPARISON:  09/09/2020 FINDINGS: CT HEAD FINDINGS Brain: Generalized atrophy. Normal ventricular morphology. No midline shift or mass effect. Normal appearance of brain parenchyma. No intracranial hemorrhage, mass lesion, or evidence of acute infarction. No extra-axial fluid collections. Vascular: Atherosclerotic calcification of internal carotid arteries bilaterally at skull base. No hyperdense vessels. Skull: Calvaria intact. Foci of cyst soft tissue gas and swelling in posterior LEFT parietal region. Soft tissue swelling RIGHT supraorbital unchanged. Sinuses/Orbits: Minimal scattered mucosal thickening in the paranasal sinuses. Other: N/A CT CERVICAL SPINE FINDINGS Alignment: Normal Skull base and vertebrae: Osseous demineralization. Skull base intact. Multilevel disc space narrowing and endplate spur formation cervical spine. Scattered facet degenerative changes. Vertebral body heights maintained without fracture or subluxation. No bone destruction. Soft tissues and spinal canal: Prevertebral soft tissues normal thickness. Atherosclerotic calcifications at carotid bifurcations. Disc levels:  No specific abnormalities. Upper chest: Lung apices clear Other: N/A IMPRESSION: Generalized atrophy. No acute intracranial abnormalities. Degenerative disc and facet disease changes of the cervical spine. No acute cervical spine abnormalities. Electronically Signed   By: Lavonia Dana  M.D.   On: 09/15/2020 16:18   CT Cervical Spine Wo Contrast  Result Date: 09/15/2020 CLINICAL DATA:  [place] today, history Parkinson's disease, fell striking back of head, laceration with controlled bleeding, no loss of consciousness, bruising at RIGHT eye from a fall 1 week ago EXAM: CT HEAD WITHOUT CONTRAST CT CERVICAL SPINE WITHOUT CONTRAST TECHNIQUE: Multidetector CT imaging of the head and cervical spine  was performed following the standard protocol without intravenous contrast. Multiplanar CT image reconstructions of the cervical spine were also generated. COMPARISON:  09/09/2020 FINDINGS: CT HEAD FINDINGS Brain: Generalized atrophy. Normal ventricular morphology. No midline shift or mass effect. Normal appearance of brain parenchyma. No intracranial hemorrhage, mass lesion, or evidence of acute infarction. No extra-axial fluid collections. Vascular: Atherosclerotic calcification of internal carotid arteries bilaterally at skull base. No hyperdense vessels. Skull: Calvaria intact. Foci of cyst soft tissue gas and swelling in posterior LEFT parietal region. Soft tissue swelling RIGHT supraorbital unchanged. Sinuses/Orbits: Minimal scattered mucosal thickening in the paranasal sinuses. Other: N/A CT CERVICAL SPINE FINDINGS Alignment: Normal Skull base and vertebrae: Osseous demineralization. Skull base intact. Multilevel disc space narrowing and endplate spur formation cervical spine. Scattered facet degenerative changes. Vertebral body heights maintained without fracture or subluxation. No bone destruction. Soft tissues and spinal canal: Prevertebral soft tissues normal thickness. Atherosclerotic calcifications at carotid bifurcations. Disc levels:  No specific abnormalities. Upper chest: Lung apices clear Other: N/A IMPRESSION: Generalized atrophy. No acute intracranial abnormalities. Degenerative disc and facet disease changes of the cervical spine. No acute cervical spine abnormalities. Electronically  Signed   By: Lavonia Dana M.D.   On: 09/15/2020 16:18    ____________________________________________    PROCEDURES  Procedure(s) performed:     .Suture Removal  Date/Time: 09/15/2020 9:00 PM Performed by: Lannie Fields, PA-C Authorized by: Lannie Fields, PA-C   Consent:    Consent obtained:  Verbal   Risks, benefits, and alternatives were discussed: yes   Universal protocol:    Procedure explained and questions answered to patient or proxy's satisfaction: yes     Patient identity confirmed:  Verbally with patient Location:    Location:  [place]   Head/neck location:  Forehead Procedure details:    Wound appearance:  No signs of infection   Number of sutures removed:  6 Post-procedure details:    Post-removal:  Dressing applied     Medications  lidocaine-EPINEPHrine-tetracaine (LET) topical gel (3 mLs Topical Given 09/15/20 1553)     ____________________________________________   INITIAL IMPRESSION / ASSESSMENT AND PLAN / ED COURSE  Pertinent labs & imaging results that were available during my care of the patient were reviewed by me and considered in my medical decision making (see chart for details).      Assessment and plan:  Fall 65 year old male presents to the emergency department after another fall.  Patient was hypertensive at triage but vital signs were otherwise reassuring.  On exam, patient was alert, active and nontoxic-appearing.  CTs of the head and cervical spine showed no evidence of intracranial bleed, skull fracture or C-spine fracture.   Patient sutures were removed from prior emergency department encounter at right forehead.  Patient declined pain medication in emergency department.  Return precautions were given to return with new or worsening symptoms.  All patient questions were answered.  ____________________________________________  FINAL CLINICAL IMPRESSION(S) / ED DIAGNOSES  Final diagnoses:  Fall, initial encounter   Visit for suture removal      NEW MEDICATIONS STARTED DURING THIS VISIT:  ED Discharge Orders     None           This chart was dictated using voice recognition software/Dragon. Despite best efforts to proofread, errors can occur which can change the meaning. Any change was purely unintentional.     Karren Cobble 09/15/20 2104    Merlyn Lot, MD 09/17/20 [phone]

## 2020-09-15 NOTE — ED Triage Notes (Addendum)
Pt comes with c/o fall today. Pt has hx of Parkinsons. Pt fell today hitting the back of head. Pt has lac noted bleeding controlled. No LOC or blood thinners.  Pt has bruised noted to right eye from week ago fall. Stiches in place from same fall as well.

## 2020-09-16 LAB — PATHOLOGIST SMEAR REVIEW

## 2020-09-18 ENCOUNTER — Ambulatory Visit: Payer: Medicare PPO | Attending: Neurology

## 2020-09-18 ENCOUNTER — Other Ambulatory Visit: Payer: Self-pay

## 2020-09-18 DIAGNOSIS — R2681 Unsteadiness on feet: Secondary | ICD-10-CM | POA: Diagnosis present

## 2020-09-18 DIAGNOSIS — R2689 Other abnormalities of gait and mobility: Secondary | ICD-10-CM

## 2020-09-18 DIAGNOSIS — M6281 Muscle weakness (generalized): Secondary | ICD-10-CM | POA: Insufficient documentation

## 2020-09-18 DIAGNOSIS — R278 Other lack of coordination: Secondary | ICD-10-CM | POA: Diagnosis present

## 2020-09-18 NOTE — Therapy (Signed)
[place] MAIN [facility] SERVICES [address] [place], [place], [address] Phone: [phone]   Fax:  [phone]  Physical Therapy Evaluation  Patient Details  Name: Cory [name] MRN: [id number] Date of Birth: [birth year] Referring Provider (PT): Benay Spice  Encounter Date: 09/18/2020   PT End of Session - 09/18/20 2050     Visit Number 1    Number of Visits 24    Date for PT Re-Evaluation 12/11/20    Authorization Type 1/10 eval 6/16    PT Start Time 1104    PT Stop Time 1218    PT Time Calculation (min) 74 min    Equipment Utilized During Treatment Gait belt    Activity Tolerance Patient tolerated treatment well    Behavior During Therapy WFL for tasks assessed/performed             Past Medical History:  Diagnosis Date   Arthritis    hips and back   Asthma    as a baby-    GERD (gastroesophageal reflux disease)    rare -    Parkinson's disease ([place])    Pneumonia    age 65   Rash    legs- on going    Past Surgical History:  Procedure Laterality Date   APPENDECTOMY     BACK SURGERY     posterior lumbar spine fusion L4-5   COLONOSCOPY     COLONOSCOPY WITH PROPOFOL N/A 06/06/2017   Procedure: COLONOSCOPY WITH PROPOFOL;  Surgeon: Manya Silvas, MD;  Location: [facility] ENDOSCOPY;  Service: Endoscopy;  Laterality: N/A;   KNEE ARTHROSCOPY Bilateral    ACL   KNEE ARTHROSCOPY W/ ACL RECONSTRUCTION     RADIAL KERATOTOMY     ROTATOR CUFF REPAIR Bilateral    SHOULDER ARTHROSCOPY WITH BICEPS TENDON REPAIR Left 04/16/2019   Procedure: SHOULDER ARTHROSCOPY WITH BICEPS TENDON REPAIR, MINI OPEN SUPERIOR CAPSULAR RECONSTRUCTION, BICEPS TENODESIS;  Surgeon: Leim Fabry, MD;  Location: [facility] ORS;  Service: Orthopedics;  Laterality: Left;    There were no vitals filed for this visit.    Subjective Assessment - 09/18/20 1115     Subjective Patient presents to physical therapy for evaluation of Parkinson's and assisted device.     Patient is accompained by: Family member    Pertinent History Patient admitted to hospital 05/23/20 with code stroke and R sided weakness. Imaging negative for acute changes. PMH includes Parkinson's with autonomic instability, asthma, GERD depression, degenerative spondylolisthesis, focal seizures and migraine syndrome. Patient had three ED visits for falls since then. Patient reports when he has the seizures he has pain in his temples and bright flashes in his vision. Last week had three falls in one day.    Limitations Lifting;Standing;Walking;House hold activities    How long can you sit comfortably? sleeps in easy chair.    How long can you stand comfortably? feel unsteady once in standing.    How long can you walk comfortably? difficult to walk during "off period"    Patient Stated Goals stability, have a device that will be most helpful, help to become and stay independent.    Currently in Pain? No/denies                [facility] PT Assessment - 09/18/20 0001       Assessment   Medical Diagnosis Parkinson's    Referring Provider (PT) Sunday Corn, Autumn Sharrie Rothman    Onset Date/Surgical Date --   december 2011, diagnosed 2012  Hand Dominance Right    Prior Therapy BIG and LOUD      Precautions   Precautions Fall      Restrictions   Weight Bearing Restrictions No      Balance Screen   Has the patient fallen in the past 6 months Yes    How many times? about 25    Has the patient had a decrease in activity level because of a fear of falling?  Yes    Is the patient reluctant to leave their home because of a fear of falling?  Yes      [place] Private residence    Living Arrangements Spouse/significant other    Available Help at Discharge Family    Type of [facility] to enter    Entrance Stairs-Number of Steps 4   but building a ramp this weekend   [place] One level      Prior Function   Level of Independence Independent with  basic ADLs    Vocation Retired    Leisure watch tv      Observation/Other Assessments   Focus on Therapeutic Outcomes (FOTO)  40      Sensation   Proprioception Impaired by gross assessment      Coordination   Gross Motor Movements are Fluid and Coordinated No      Posture/Postural Control   Posture/Postural Control Postural limitations    Postural Limitations Rounded Shoulders;Forward head;Flexed trunk      Ambulation/Gait   Ambulation/Gait Yes    Assistive device Rolling walker    Gait Pattern Shuffle;Trunk flexed;Narrow base of support;Poor foot clearance - left;Poor foot clearance - right      Balance   Balance Assessed Yes      Standardized Balance Assessment   Standardized Balance Assessment Berg Balance Test      Berg Balance Test   Sit to Stand Able to stand using hands after several tries    Standing Unsupported Able to stand 2 minutes with supervision    Sitting with Back Unsupported but Feet Supported on Floor or Stool Able to sit safely and securely 2 minutes    Stand to Sit Controls descent by using hands    Transfers Able to transfer with verbal cueing and /or supervision    Standing Unsupported with Eyes Closed Unable to keep eyes closed 3 seconds but stays steady    Standing Unsupported with Feet Together Needs help to attain position but able to stand for 30 seconds with feet together    From Standing, Reach Forward with Outstretched Arm Can reach forward >5 cm safely (2")    From Standing Position, Pick up Object from Floor Able to pick up shoe, needs supervision    From Standing Position, Turn to Look Behind Over each Shoulder Turn sideways only but maintains balance    Turn 360 Degrees Needs close supervision or verbal cueing    Standing Unsupported, Alternately Place Feet on Step/Stool Able to complete 4 steps without aid or supervision    Standing Unsupported, One Foot in Front Able to take small step independently and hold 30 seconds    Standing on One  Leg Unable to try or needs assist to prevent fall    Total Score 28                    PAIN: Patient reports no pain  POSTURE: Seated: forward head rounded shoulders Standing:  forward trunk lean; limited ability to turn with increased shuffle steppage.   PROM/AROM:  AROM BUE: challenged with overhead raise bilaterally; poor grip bilaterally  AROM BLE: slight hip extension limitation.   Accessory Motions:    STRENGTH:  Graded on a 0-5 scale Muscle Group Left Right  Hip Flex 4/5 4/5  Hip Abd 4/5 4/5  Hip Add 4-/5 4-/5  Hip Ext 2+/5 2+/5  Hip IR/ER 3+/5 3+/5  Knee Flex 3+/5 3+/5  Knee Ext 4-/5 4-/5  Ankle DF 4-/5 4-/5  Ankle PF 4-/5 4-/5    SOMATOSENSORY:  Any N & T in extremities or weakness: reports :         Sensation           Intact      Diminished         Absent  Light touch LEs Hand sensation                             COORDINATION: Finger to Nose: Dysmetric with past pointing          Heel Shin Slide Test:dysmetric with past pointing   SPECIAL TESTS: Cranial Nerves/Vision able to track vision of pen, challenging with close vision resulting in double vision.   FUNCTIONAL MOBILITY: STS: excessive posterior trunk lean resulting in LOB Standing: occasional posterior LOB.  Turns:freezing and shuffle steppage with near LOB.  Negotiation of doorframes: freezing and shuffle steppage  BALANCE: Static Sitting Balance  Normal Able to maintain balance against maximal resistance   Good Able to maintain balance against moderate resistance   Good-/Fair+ Accepts minimal resistance   Fair Able to sit unsupported without balance loss and without UE support x  Poor+ Able to maintain with Minimal assistance from individual or chair   Poor Unable to maintain balance-requires mod/max support from individual or chair    Static Standing Balance  Normal Able to maintain standing balance against maximal resistance   Good Able to maintain standing balance  against moderate resistance   Good-/Fair+ Able to maintain standing balance against minimal resistance   Fair Able to stand unsupported without UE support and without LOB for 1-2 min   Fair- Requires Min A and UE support to maintain standing without loss of balance x  Poor+ Requires mod A and UE support to maintain standing without loss of balance   Poor Requires max A and UE support to maintain standing balance without loss    Dynamic Sitting Balance  Normal Able to sit unsupported and weight shift across midline maximally   Good Able to sit unsupported and weight shift across midline moderately   Good-/Fair+ Able to sit unsupported and weight shift across midline minimally   Fair Minimal weight shifting ipsilateral/front, difficulty crossing midline   Fair- Reach to ipsilateral side and unable to weight shift x  Poor + Able to sit unsupported with min A and reach to ipsilateral side, unable to weight shift   Poor Able to sit unsupported with mod A and reach ipsilateral/front-can't cross midline    Standing Dynamic Balance  Normal Stand independently unsupported, able to weight shift and cross midline maximally   Good Stand independently unsupported, able to weight shift and cross midline moderately   Good-/Fair+ Stand independently unsupported, able to weight shift across midline minimally   Fair Stand independently unsupported, weight shift, and reach ipsilaterally, loss of balance when crossing midline   Poor+ Able to stand with Min A  and reach ipsilaterally, unable to weight shift x  Poor Able to stand with Mod A and minimally reach ipsilaterally, unable to cross midline.      GAIT: Extreme challenge with turning requiring PT assistance to not lose balance. Unsafe with cane, needs use of walker.   OUTCOME MEASURES: TEST Outcome Interpretation  5 times sit<>stand 9 sec >60 yo, >15 sec indicates increased risk for falls  10 meter walk test      8 seconds with cane 8 seconds with  walker           m/s <1.0 m/s indicates increased risk for falls; limited community ambulator  Timed up and Go       Do next time          sec <14 sec indicates increased risk for falls  6 minute walk test          945      Feet with RW  1000 feet is community Water quality scientist 28/56  <36/56 (100% risk for falls), 37-45 (80% risk for falls); 46-51 (>50% risk for falls); 52-55 (lower risk <25% of falls)  FOTO 48 Predicted discharge score of 51%        Objective measurements completed on examination: See above findings.     Patient is a pleasant 65 year old male who presents to physical therapy for evaluation of Parkinson's and assistive device. He is unsafe with a cane and requires extensive guarding for turns and changing surfaces. He required assistance to return to his car at end of evaluation due to unsafe ambulation. Will benefit from further evaluation as to which type of walker is best suited to the patient at this time. Patient does have some double vision with near objects and will also benefit from a referral to OT due to numbness in hand and limited coordination. He is highly motivated to reduce his falls and be as independent as possible. Patient is a very high falls risk and will benefit from focused physical therapy to increase balance, stability, and ability to negotiate surfaces.           PT Education - 09/18/20 2050     Education Details goals, POC    Person(s) Educated Patient;Spouse    Methods Explanation;Demonstration;Tactile cues;Verbal cues    Comprehension Verbalized understanding;Returned demonstration;Verbal cues required;Tactile cues required              PT Short Term Goals - 09/18/20 2057       PT SHORT TERM GOAL #1   Title Patient will be independent in home exercise program to improve strength/mobility for better functional independence with ADLs.    Baseline 6/16: HEP to be given next session    Time 4    Period Weeks     Status New    Target Date 10/16/20               PT Long Term Goals - 09/18/20 2057       PT LONG TERM GOAL #1   Title Patient will increase FOTO score to equal to or greater than 51%    to demonstrate statistically significant improvement in mobility and quality of life.    Baseline 6/16: 48%    Time 12    Period Weeks    Status New    Target Date 12/11/20      PT LONG TERM GOAL #2   Title Patient will increase Berg Balance score by > 6 points (  34/56)  to demonstrate decreased fall risk during functional activities.    Baseline 6/16: 28/56    Time 12    Period Weeks    Status New    Target Date 12/11/20      PT LONG TERM GOAL #3   Title Patient will deny any falls over past 4 weeks to demonstrate improved safety awareness at home and work.    Baseline 6/16: multiple falls    Time 12    Period Weeks    Status New    Target Date 12/11/20      PT LONG TERM GOAL #4   Title Patient will increase six minute walk test distance to >1300 for progression to age norm community ambulator and improve gait ability    Baseline 6/16: 945 ft with multiple near falls    Time 12    Period Weeks    Status New    Target Date 12/11/20                    Plan - 09/18/20 2052     Clinical Impression Statement Patient is a pleasant 65 year old male who presents to physical therapy for evaluation of Parkinson's and assistive device. He is unsafe with a cane and requires extensive guarding for turns and changing surfaces. He required assistance to return to his car at end of evaluation due to unsafe ambulation. Will benefit from further evaluation as to which type of walker is best suited to the patient at this time. Patient does have some double vision with near objects and will also benefit from a referral to OT due to numbness in hand and limited coordination. He is highly motivated to reduce his falls and be as independent as possible. Patient is a very high falls risk and will  benefit from focused physical therapy to increase balance, stability, and ability to negotiate surfaces.    Personal Factors and Comorbidities Age;Comorbidity 3+;Fitness;Past/Current Experience;Time since onset of injury/illness/exacerbation;Transportation    Comorbidities Parkinson's with autonomic instability, asthma, GERD depression, degenerative spondylolisthesis, focal seizures and migraine syndrome    Examination-Activity Limitations Bathing;Bed Mobility;Bend;Caring for [employer];Locomotion Level;Lift;Hygiene/Grooming;Squat;Stairs;Stand;Toileting;Transfers    Examination-Participation Restrictions Church;Cleaning;Community Activity;Driving;Meal Prep;Medication Management;Laundry;Shop;Volunteer;Yard Work    Stability/Clinical Decision Making Unstable/Unpredictable    Surveyor, mining    Rehab Potential Fair    PT Frequency 2x / week    PT Duration 12 weeks    PT Treatment/Interventions ADLs/Self Care Home Management;Biofeedback;Canalith Repostioning;Cryotherapy;Parrafin;Ultrasound;Traction;Moist Heat;Iontophoresis 4mg /ml Dexamethasone;Electrical Stimulation;DME Instruction;Gait training;Stair training;Functional mobility training;Neuromuscular re-education;Balance training;Therapeutic exercise;Therapeutic activities;Patient/family education;Orthotic Fit/Training;Manual techniques;Passive range of motion;Manual lymph drainage;Energy conservation;Splinting;Taping;Vasopneumatic Device;Vestibular;Joint Manipulations;Visual/perceptual remediation/compensation    PT Next Visit Plan try U step walker, give HEP    PT Home Exercise Plan to give next session    Recommended Other Services OT    Consulted and Agree with Plan of Care Patient;Family member/caregiver    Family Member Consulted wife             Patient will benefit from skilled therapeutic intervention in order to improve the following deficits and impairments:  Abnormal gait, Decreased activity  tolerance, Decreased balance, Decreased knowledge of precautions, Decreased endurance, Decreased coordination, Decreased cognition, Decreased knowledge of use of DME, Decreased mobility, Decreased safety awareness, Difficulty walking, Decreased strength, Impaired flexibility, Impaired perceived functional ability, Impaired sensation, Impaired tone, Impaired UE functional use, Impaired vision/preception, Improper body mechanics, Postural dysfunction  Visit Diagnosis: Other abnormalities of gait and mobility  Unsteadiness on feet  Problem List Patient Active Problem List   Diagnosis Date Noted   Focal seizure ([place]) 05/24/2020   Parkinson's disease ([place])    History of migraine headaches    Migraine syndrome    BPH (benign prostatic hyperplasia)    Depression    Degenerative spondylolisthesis 05/07/2016   Janna Arch, PT, DPT   09/18/2020, 9:07 PM  [place] MAIN [facility] SERVICES [address] [place], [place], [address] Phone: [phone]   Fax:  [phone]  Name: SHAVAR GORKA MRN: [id number] Date of Birth: Jul 15, 1955

## 2020-09-23 ENCOUNTER — Other Ambulatory Visit: Payer: Self-pay

## 2020-09-23 ENCOUNTER — Ambulatory Visit: Payer: Medicare PPO

## 2020-09-23 DIAGNOSIS — R2681 Unsteadiness on feet: Secondary | ICD-10-CM

## 2020-09-23 DIAGNOSIS — R2689 Other abnormalities of gait and mobility: Secondary | ICD-10-CM

## 2020-09-23 NOTE — Therapy (Signed)
[place] MAIN [facility] SERVICES [address] [place], [place], [address] Phone: [phone]   Fax:  [phone]  Physical Therapy Treatment  Patient Details  Name: Cory [name] MRN: [id number] Date of Birth: [birth year] Referring Provider (PT): Benay Spice   Encounter Date: 09/23/2020   PT End of Session - 09/23/20 1224     Visit Number 2    Number of Visits 24    Date for PT Re-Evaluation 12/11/20    Authorization Type 2/10 eval 6/16    PT Start Time 1105    PT Stop Time 1146    PT Time Calculation (min) 41 min    Equipment Utilized During Treatment Gait belt    Activity Tolerance Patient tolerated treatment well    Behavior During Therapy WFL for tasks assessed/performed             Past Medical History:  Diagnosis Date   Arthritis    hips and back   Asthma    as a baby-    GERD (gastroesophageal reflux disease)    rare -    Parkinson's disease ([place])    Pneumonia    age 72   Rash    legs- on going    Past Surgical History:  Procedure Laterality Date   APPENDECTOMY     BACK SURGERY     posterior lumbar spine fusion L4-5   COLONOSCOPY     COLONOSCOPY WITH PROPOFOL N/A 06/06/2017   Procedure: COLONOSCOPY WITH PROPOFOL;  Surgeon: Manya Silvas, MD;  Location: [facility] ENDOSCOPY;  Service: Endoscopy;  Laterality: N/A;   KNEE ARTHROSCOPY Bilateral    ACL   KNEE ARTHROSCOPY W/ ACL RECONSTRUCTION     RADIAL KERATOTOMY     ROTATOR CUFF REPAIR Bilateral    SHOULDER ARTHROSCOPY WITH BICEPS TENDON REPAIR Left 04/16/2019   Procedure: SHOULDER ARTHROSCOPY WITH BICEPS TENDON REPAIR, MINI OPEN SUPERIOR CAPSULAR RECONSTRUCTION, BICEPS TENODESIS;  Surgeon: Leim Fabry, MD;  Location: [facility] ORS;  Service: Orthopedics;  Laterality: Left;    There were no vitals filed for this visit.   Subjective Assessment - 09/23/20 1207     Subjective Patient presents with wife. Reports no falls or LOB since last session.    Patient is  accompained by: Family member    Pertinent History Patient admitted to hospital 05/23/20 with code stroke and R sided weakness. Imaging negative for acute changes. PMH includes Parkinson's with autonomic instability, asthma, GERD depression, degenerative spondylolisthesis, focal seizures and migraine syndrome. Patient had three ED visits for falls since then. Patient reports when he has the seizures he has pain in his temples and bright flashes in his vision. Last week had three falls in one day.    Limitations Lifting;Standing;Walking;House hold activities    How long can you sit comfortably? sleeps in easy chair.    How long can you stand comfortably? feel unsteady once in standing.    How long can you walk comfortably? difficult to walk during "off period"    Patient Stated Goals stability, have a device that will be most helpful, help to become and stay independent.    Currently in Pain? No/denies                 Treat:   Neuro Re-ed Step over theraband and clap, occasional UE support 10x each LE Lateral step over hurdle 10x each side, second set with dual task of cognitive task: counting down from 100 by 5's.  Cone tap :  three cones in front of patient with decreasing UE support x 8 trials each UE  Seated: Alternating toe taps to theraband for coordination, spatial awareness, and sequencing of muscle activation.    TherEx: Weighted sit to stand (3000 gr ball) 12x ambulate 96 ft negotiating room with U step walker x 2 trials; good step length and sequencing.    GIVE HEP;  Access Code: [email or web address] URL: [email or web address] Date: 09/22/2020 Prepared by: Janna Arch  Exercises  Standing March with Counter Support - 1 x daily - 7 x weekly - 2 sets - 10 reps - 5 hold Standing Hip Extension with Counter Support - 1 x daily - 7 x weekly - 2 sets - 10 reps - 5 hold Standing Tandem Balance with Counter Support - 1 x daily - 7 x weekly - 2 sets - 2 reps - 30  hold Seated Scapular Retraction - 1 x daily - 7 x weekly - 2 sets - 10 reps - 5 hold   Pt educated throughout session about proper posture and technique with exercises. Improved exercise technique, movement at target joints, use of target muscles after min to mod verbal, visual, tactile cues   Patient educated on use of U step walker; demonstrated improved gait mechanics. Will request cert for patient to receive his own U step walker from physician. Educated patient and patient's wife on HEP. Patient highly motivated and eager to participate in therapy indicating good prognosis. Patient will benefit from skilled physical therapy to improve balance, gait mechanics, and overall functional mobility.             PT Education - 09/23/20 1210     Education Details exercise technique, body mechanics    Person(s) Educated Patient    Methods Explanation;Demonstration;Verbal cues;Tactile cues    Comprehension Verbalized understanding;Returned demonstration;Verbal cues required;Tactile cues required              PT Short Term Goals - 09/18/20 2057       PT SHORT TERM GOAL #1   Title Patient will be independent in home exercise program to improve strength/mobility for better functional independence with ADLs.    Baseline 6/16: HEP to be given next session    Time 4    Period Weeks    Status New    Target Date 10/16/20               PT Long Term Goals - 09/18/20 2057       PT LONG TERM GOAL #1   Title Patient will increase FOTO score to equal to or greater than 51%    to demonstrate statistically significant improvement in mobility and quality of life.    Baseline 6/16: 48%    Time 12    Period Weeks    Status New    Target Date 12/11/20      PT LONG TERM GOAL #2   Title Patient will increase Berg Balance score by > 6 points (34/56)  to demonstrate decreased fall risk during functional activities.    Baseline 6/16: 28/56    Time 12    Period Weeks    Status New     Target Date 12/11/20      PT LONG TERM GOAL #3   Title Patient will deny any falls over past 4 weeks to demonstrate improved safety awareness at home and work.    Baseline 6/16: multiple falls    Time 12    Period Weeks    Status New  Target Date 12/11/20      PT LONG TERM GOAL #4   Title Patient will increase six minute walk test distance to >1300 for progression to age norm community ambulator and improve gait ability    Baseline 6/16: 945 ft with multiple near falls    Time 12    Period Weeks    Status New    Target Date 12/11/20                   Plan - 09/23/20 1235     Clinical Impression Statement Patient educated on use of U step walker; demonstrated improved gait mechanics. Will request cert for patient to receive his own U step walker from physician. Educated patient and patient's wife on HEP. Patient highly motivated and eager to participate in therapy indicating good prognosis. Patient will benefit from skilled physical therapy to improve balance, gait mechanics, and overall functional mobility.    Personal Factors and Comorbidities Age;Comorbidity 3+;Fitness;Past/Current Experience;Time since onset of injury/illness/exacerbation;Transportation    Comorbidities Parkinson's with autonomic instability, asthma, GERD depression, degenerative spondylolisthesis, focal seizures and migraine syndrome    Examination-Activity Limitations Bathing;Bed Mobility;Bend;Caring for [employer];Locomotion Level;Lift;Hygiene/Grooming;Squat;Stairs;Stand;Toileting;Transfers    Examination-Participation Restrictions Church;Cleaning;Community Activity;Driving;Meal Prep;Medication Management;Laundry;Shop;Volunteer;Yard Work    Stability/Clinical Decision Making Unstable/Unpredictable    Rehab Potential Fair    PT Frequency 2x / week    PT Duration 12 weeks    PT Treatment/Interventions ADLs/Self Care Home Management;Biofeedback;Canalith  Repostioning;Cryotherapy;Parrafin;Ultrasound;Traction;Moist Heat;Iontophoresis 4mg /ml Dexamethasone;Electrical Stimulation;DME Instruction;Gait training;Stair training;Functional mobility training;Neuromuscular re-education;Balance training;Therapeutic exercise;Therapeutic activities;Patient/family education;Orthotic Fit/Training;Manual techniques;Passive range of motion;Manual lymph drainage;Energy conservation;Splinting;Taping;Vasopneumatic Device;Vestibular;Joint Manipulations;Visual/perceptual remediation/compensation    PT Next Visit Plan walk with head turns    PT Home Exercise Plan to give next session    Consulted and Agree with Plan of Care Patient;Family member/caregiver    Family Member Consulted wife             Patient will benefit from skilled therapeutic intervention in order to improve the following deficits and impairments:  Abnormal gait, Decreased activity tolerance, Decreased balance, Decreased knowledge of precautions, Decreased endurance, Decreased coordination, Decreased cognition, Decreased knowledge of use of DME, Decreased mobility, Decreased safety awareness, Difficulty walking, Decreased strength, Impaired flexibility, Impaired perceived functional ability, Impaired sensation, Impaired tone, Impaired UE functional use, Impaired vision/preception, Improper body mechanics, Postural dysfunction  Visit Diagnosis: Other abnormalities of gait and mobility  Unsteadiness on feet     Problem List Patient Active Problem List   Diagnosis Date Noted   Focal seizure ([place]) 05/24/2020   Parkinson's disease ([place])    History of migraine headaches    Migraine syndrome    BPH (benign prostatic hyperplasia)    Depression    Degenerative spondylolisthesis 05/07/2016    Janna Arch, PT, DPT  09/23/2020, 12:37 PM  [place] MAIN [facility] SERVICES [address] [place], [place], [address] Phone: [phone]   Fax:  [phone]  Name:  DECLYN DELSOL MRN: [id number] Date of Birth: 22-May-1955

## 2020-09-25 ENCOUNTER — Other Ambulatory Visit: Payer: Self-pay

## 2020-09-25 ENCOUNTER — Ambulatory Visit: Payer: Medicare PPO

## 2020-09-25 DIAGNOSIS — R2681 Unsteadiness on feet: Secondary | ICD-10-CM

## 2020-09-25 DIAGNOSIS — R2689 Other abnormalities of gait and mobility: Secondary | ICD-10-CM | POA: Diagnosis not present

## 2020-09-25 NOTE — Therapy (Signed)
[place] MAIN [facility] SERVICES [address] [place], [place], [address] Phone: [phone]   Fax:  [phone]  Physical Therapy Treatment  Patient Details  Name: Cory [name] MRN: [id number] Date of Birth: October 06, 1955 Referring Provider (PT): Benay Spice   Encounter Date: 09/25/2020   PT End of Session - 09/25/20 1251     Visit Number 3    Number of Visits 24    Date for PT Re-Evaluation 12/11/20    Authorization Type 3/10 eval 6/16    PT Start Time 0845    PT Stop Time 0929    PT Time Calculation (min) 44 min    Equipment Utilized During Treatment Gait belt    Activity Tolerance Patient tolerated treatment well    Behavior During Therapy WFL for tasks assessed/performed             Past Medical History:  Diagnosis Date   Arthritis    hips and back   Asthma    as a baby-    GERD (gastroesophageal reflux disease)    rare -    Parkinson's disease ([place])    Pneumonia    age 65   Rash    legs- on going    Past Surgical History:  Procedure Laterality Date   APPENDECTOMY     BACK SURGERY     posterior lumbar spine fusion L4-5   COLONOSCOPY     COLONOSCOPY WITH PROPOFOL N/A 06/06/2017   Procedure: COLONOSCOPY WITH PROPOFOL;  Surgeon: Manya Silvas, MD;  Location: [facility] ENDOSCOPY;  Service: Endoscopy;  Laterality: N/A;   KNEE ARTHROSCOPY Bilateral    ACL   KNEE ARTHROSCOPY W/ ACL RECONSTRUCTION     RADIAL KERATOTOMY     ROTATOR CUFF REPAIR Bilateral    SHOULDER ARTHROSCOPY WITH BICEPS TENDON REPAIR Left 04/16/2019   Procedure: SHOULDER ARTHROSCOPY WITH BICEPS TENDON REPAIR, MINI OPEN SUPERIOR CAPSULAR RECONSTRUCTION, BICEPS TENODESIS;  Surgeon: Leim Fabry, MD;  Location: [facility] ORS;  Service: Orthopedics;  Laterality: Left;    There were no vitals filed for this visit.   Subjective Assessment - 09/25/20 1250     Subjective Patient presents to therapy with wife. Reports he only did a few of his exercises not all  of them yesterday. No falls since last session.    Patient is accompained by: Family member    Pertinent History Patient admitted to hospital 05/23/20 with code stroke and R sided weakness. Imaging negative for acute changes. PMH includes Parkinson's with autonomic instability, asthma, GERD depression, degenerative spondylolisthesis, focal seizures and migraine syndrome. Patient had three ED visits for falls since then. Patient reports when he has the seizures he has pain in his temples and bright flashes in his vision. Last week had three falls in one day.    Limitations Lifting;Standing;Walking;House hold activities    How long can you sit comfortably? sleeps in easy chair.    How long can you stand comfortably? feel unsteady once in standing.    How long can you walk comfortably? difficult to walk during "off period"    Patient Stated Goals stability, have a device that will be most helpful, help to become and stay independent.    Currently in Pain? No/denies                   Treat:   Neuro Re-ed Standing with CGA next to support surface:  Airex pad: static stand 30 seconds x 2 trials, noticeable trembling of  ankles/LE's with fatigue and challenge to maintain stability Airex pad: horizontal head turns 30 seconds scanning room 10x ; cueing for arc of motion  Airex pad: one foot on 6" step one foot on airex pad, hold position for 30 seconds, switch legs, 2x each LE; Airex pad: horizontal rainbow ball twists 10x each direction no LOB Airex pad: rainbow ball straight arm raise 10x Airex pad: rainbow ball chest press 10x Lateral step over hurdle 10x each direction Forward step over hurdle 10x each LE; cue for not externally rotating stance foot.  Airex pad: lean forward outside BOS grab ball and throw at target x 15 balls for pertubations ; challenging to lean forward for patient.  Three way hedgehog tap: forward, lateral, backwards 8x each LE   Speed ladder: one foot each square  8x length, initially challenging for patient.   In hallway:  Horizontal head turns reading cards: 4x 86 ft with walker; no LOB, cues for large steps  Initiation/termination of gait with red light green light with walker 4x 86 ft ; cues for large first step to decrease shuffling  Weaving between four cones 4x length; cues for large slow step to decreasing freezing.      Pt educated throughout session about proper posture and technique with exercises. Improved exercise technique, movement at target joints, use of target muscles after min to mod verbal, visual, tactile cues  Patient is able to perform interventions with decreased episodes of freezing. Frequent cueing of "big step" and "big first step" decreased shuffle steppage and allowed for carryover to other tasks. By end of session patient is able to turn with decreased shuffle steppage. Patient highly motivated and has excellent support system. Patient will benefit from skilled physical therapy to improve balance, gait mechanics, and overall functional mobility                   PT Education - 09/25/20 1251     Education Details exercise technique, body mechanics    Person(s) Educated Patient    Methods Explanation;Demonstration;Tactile cues;Verbal cues    Comprehension Verbalized understanding;Returned demonstration;Verbal cues required;Tactile cues required              PT Short Term Goals - 09/18/20 2057       PT SHORT TERM GOAL #1   Title Patient will be independent in home exercise program to improve strength/mobility for better functional independence with ADLs.    Baseline 6/16: HEP to be given next session    Time 4    Period Weeks    Status New    Target Date 10/16/20               PT Long Term Goals - 09/18/20 2057       PT LONG TERM GOAL #1   Title Patient will increase FOTO score to equal to or greater than 51%    to demonstrate statistically significant improvement in mobility and  quality of life.    Baseline 6/16: 48%    Time 12    Period Weeks    Status New    Target Date 12/11/20      PT LONG TERM GOAL #2   Title Patient will increase Berg Balance score by > 6 points (34/56)  to demonstrate decreased fall risk during functional activities.    Baseline 6/16: 28/56    Time 12    Period Weeks    Status New    Target Date 12/11/20  PT LONG TERM GOAL #3   Title Patient will deny any falls over past 4 weeks to demonstrate improved safety awareness at home and work.    Baseline 6/16: multiple falls    Time 12    Period Weeks    Status New    Target Date 12/11/20      PT LONG TERM GOAL #4   Title Patient will increase six minute walk test distance to >1300 for progression to age norm community ambulator and improve gait ability    Baseline 6/16: 945 ft with multiple near falls    Time 12    Period Weeks    Status New    Target Date 12/11/20                   Plan - 09/25/20 1254     Clinical Impression Statement Patient is able to perform interventions with decreased episodes of freezing. Frequent cueing of "big step" and "big first step" decreased shuffle steppage and allowed for carryover to other tasks. By end of session patient is able to turn with decreased shuffle steppage. Patient highly motivated and has excellent support system. Patient will benefit from skilled physical therapy to improve balance, gait mechanics, and overall functional mobility    Personal Factors and Comorbidities Age;Comorbidity 3+;Fitness;Past/Current Experience;Time since onset of injury/illness/exacerbation;Transportation    Comorbidities Parkinson's with autonomic instability, asthma, GERD depression, degenerative spondylolisthesis, focal seizures and migraine syndrome    Examination-Activity Limitations Bathing;Bed Mobility;Bend;Caring for [employer];Locomotion Level;Lift;Hygiene/Grooming;Squat;Stairs;Stand;Toileting;Transfers     Examination-Participation Restrictions Church;Cleaning;Community Activity;Driving;Meal Prep;Medication Management;Laundry;Shop;Volunteer;Yard Work    Stability/Clinical Decision Making Unstable/Unpredictable    Rehab Potential Fair    PT Frequency 2x / week    PT Duration 12 weeks    PT Treatment/Interventions ADLs/Self Care Home Management;Biofeedback;Canalith Repostioning;Cryotherapy;Parrafin;Ultrasound;Traction;Moist Heat;Iontophoresis 4mg /ml Dexamethasone;Electrical Stimulation;DME Instruction;Gait training;Stair training;Functional mobility training;Neuromuscular re-education;Balance training;Therapeutic exercise;Therapeutic activities;Patient/family education;Orthotic Fit/Training;Manual techniques;Passive range of motion;Manual lymph drainage;Energy conservation;Splinting;Taping;Vasopneumatic Device;Vestibular;Joint Manipulations;Visual/perceptual remediation/compensation    PT Next Visit Plan walk with head turns    PT Home Exercise Plan to give next session    Consulted and Agree with Plan of Care Patient;Family member/caregiver    Family Member Consulted wife             Patient will benefit from skilled therapeutic intervention in order to improve the following deficits and impairments:  Abnormal gait, Decreased activity tolerance, Decreased balance, Decreased knowledge of precautions, Decreased endurance, Decreased coordination, Decreased cognition, Decreased knowledge of use of DME, Decreased mobility, Decreased safety awareness, Difficulty walking, Decreased strength, Impaired flexibility, Impaired perceived functional ability, Impaired sensation, Impaired tone, Impaired UE functional use, Impaired vision/preception, Improper body mechanics, Postural dysfunction  Visit Diagnosis: Other abnormalities of gait and mobility  Unsteadiness on feet     Problem List Patient Active Problem List   Diagnosis Date Noted   Focal seizure ([place]) 05/24/2020   Parkinson's disease ([place])     History of migraine headaches    Migraine syndrome    BPH (benign prostatic hyperplasia)    Depression    Degenerative spondylolisthesis 05/07/2016   Janna Arch, PT, DPT  09/25/2020, 12:55 PM  [place] MAIN [facility] SERVICES [address] [place], [place], [address] Phone: [phone]   Fax:  [phone]  Name: AYVION KAVANAGH MRN: [id number] Date of Birth: 1955-09-08

## 2020-09-30 ENCOUNTER — Other Ambulatory Visit: Payer: Self-pay

## 2020-09-30 ENCOUNTER — Ambulatory Visit: Payer: Medicare PPO

## 2020-09-30 DIAGNOSIS — R278 Other lack of coordination: Secondary | ICD-10-CM

## 2020-09-30 DIAGNOSIS — R2689 Other abnormalities of gait and mobility: Secondary | ICD-10-CM

## 2020-09-30 DIAGNOSIS — R2681 Unsteadiness on feet: Secondary | ICD-10-CM

## 2020-09-30 DIAGNOSIS — M6281 Muscle weakness (generalized): Secondary | ICD-10-CM

## 2020-09-30 NOTE — Therapy (Signed)
[place] MAIN [facility] SERVICES [address] [place], [place], [address] Phone: [phone]   Fax:  [phone]  Physical Therapy Treatment  Patient Details  Name: Cory [name] MRN: [id number] Date of Birth: January 20, 1956 Referring Provider (PT): Benay Spice   Encounter Date: 09/30/2020   PT End of Session - 09/30/20 1151     Visit Number 4    Number of Visits 24    Date for PT Re-Evaluation 12/11/20    Authorization Type 4/10 eval 6/16    PT Start Time 1105    PT Stop Time 1146    PT Time Calculation (min) 41 min    Equipment Utilized During Treatment Gait belt    Activity Tolerance Patient tolerated treatment well    Behavior During Therapy WFL for tasks assessed/performed             Past Medical History:  Diagnosis Date   Arthritis    hips and back   Asthma    as a baby-    GERD (gastroesophageal reflux disease)    rare -    Parkinson's disease ([place])    Pneumonia    age 45   Rash    legs- on going    Past Surgical History:  Procedure Laterality Date   APPENDECTOMY     BACK SURGERY     posterior lumbar spine fusion L4-5   COLONOSCOPY     COLONOSCOPY WITH PROPOFOL N/A 06/06/2017   Procedure: COLONOSCOPY WITH PROPOFOL;  Surgeon: Manya Silvas, MD;  Location: [facility] ENDOSCOPY;  Service: Endoscopy;  Laterality: N/A;   KNEE ARTHROSCOPY Bilateral    ACL   KNEE ARTHROSCOPY W/ ACL RECONSTRUCTION     RADIAL KERATOTOMY     ROTATOR CUFF REPAIR Bilateral    SHOULDER ARTHROSCOPY WITH BICEPS TENDON REPAIR Left 04/16/2019   Procedure: SHOULDER ARTHROSCOPY WITH BICEPS TENDON REPAIR, MINI OPEN SUPERIOR CAPSULAR RECONSTRUCTION, BICEPS TENODESIS;  Surgeon: Leim Fabry, MD;  Location: [facility] ORS;  Service: Orthopedics;  Laterality: Left;    There were no vitals filed for this visit.   Subjective Assessment - 09/30/20 1151     Subjective Patient presents after OT evaluation. Has been compliant with HEP, no falls or LOB     Patient is accompained by: Family member    Pertinent History Patient admitted to hospital 05/23/20 with code stroke and R sided weakness. Imaging negative for acute changes. PMH includes Parkinson's with autonomic instability, asthma, GERD depression, degenerative spondylolisthesis, focal seizures and migraine syndrome. Patient had three ED visits for falls since then. Patient reports when he has the seizures he has pain in his temples and bright flashes in his vision. Last week had three falls in one day.    Limitations Lifting;Standing;Walking;House hold activities    How long can you sit comfortably? sleeps in easy chair.    How long can you stand comfortably? feel unsteady once in standing.    How long can you walk comfortably? difficult to walk during "off period"    Patient Stated Goals stability, have a device that will be most helpful, help to become and stay independent.    Currently in Pain? No/denies                  Treat:   Neuro Re-ed Standing with CGA next to support surface:  Airex pad: one foot on 6" step one foot on airex pad, hold position for 30 seconds, switch legs, 2x each LE; Airex pad:  6" step sandwhich  lateral step up/down 12x each side  Airex pad: lean forward outside BOS grab ball and throw at target x 15 balls for perturbations ; challenging to lean forward for patient.  Airex balance beam: one foot on airex pad one foot on soccer ball clockwise circles 10x each LE; BUE support for stabilization provided.  Tilt board: A/P BUE support 12x each foot position Tilt board: Right/left lateral weight shift 12x each side     Speed ladder: one foot each square 8x length, initially challenging for patient able to progress without UE support    In hallway: Head turns based on PT guidance; up/down/left/right then return to forward head position 4x 86 ft no LOB; close CGA Initiation/termination of gait with red light green light with walker 4x 86 ft ; cues for  large first step to decrease shuffling forwards 2x; backwards 2x    TherEx: Squat jumps 10x with BUE support Forward modified lunges with high knee step between 8x length of // bars SUE support 20" step: step up/down BUE support 10x each LE  Sit to stand with weighted ball press (10x) with 2000 gr ball    Pt educated throughout session about proper posture and technique with exercises. Improved exercise technique, movement at target joints, use of target muscles after min to mod verbal, visual, tactile cues   Patient is highly motivated throughout physical therapy session. Power movements added with patient tolerating progressions well. Cues for large first steps and large steps with turning result in decreased shuffle steppage. Tremors of LLE noted throughout session with constant movement, wife reports this is baseline of patient. Script given to patient from physician for U step walker. Patient will benefit from skilled physical therapy to improve balance, gait mechanics, and overall functional mobility                    PT Education - 09/30/20 1151     Education Details exercise technique, large first steps    Person(s) Educated Patient    Methods Explanation;Tactile cues;Demonstration;Verbal cues    Comprehension Verbalized understanding;Returned demonstration;Verbal cues required;Tactile cues required              PT Short Term Goals - 09/18/20 2057       PT SHORT TERM GOAL #1   Title Patient will be independent in home exercise program to improve strength/mobility for better functional independence with ADLs.    Baseline 6/16: HEP to be given next session    Time 4    Period Weeks    Status New    Target Date 10/16/20               PT Long Term Goals - 09/18/20 2057       PT LONG TERM GOAL #1   Title Patient will increase FOTO score to equal to or greater than 51%    to demonstrate statistically significant improvement in mobility and quality  of life.    Baseline 6/16: 48%    Time 12    Period Weeks    Status New    Target Date 12/11/20      PT LONG TERM GOAL #2   Title Patient will increase Berg Balance score by > 6 points (34/56)  to demonstrate decreased fall risk during functional activities.    Baseline 6/16: 28/56    Time 12    Period Weeks    Status New    Target Date 12/11/20  PT LONG TERM GOAL #3   Title Patient will deny any falls over past 4 weeks to demonstrate improved safety awareness at home and work.    Baseline 6/16: multiple falls    Time 12    Period Weeks    Status New    Target Date 12/11/20      PT LONG TERM GOAL #4   Title Patient will increase six minute walk test distance to >1300 for progression to age norm community ambulator and improve gait ability    Baseline 6/16: 945 ft with multiple near falls    Time 12    Period Weeks    Status New    Target Date 12/11/20                   Plan - 09/30/20 1153     Clinical Impression Statement Patient is highly motivated throughout physical therapy session. Power movements added with patient tolerating progressions well. Cues for large first steps and large steps with turning result in decreased shuffle steppage. Tremors of LLE noted throughout session with constant movement, wife reports this is baseline of patient. Script given to patient from physician for U step walker. Patient will benefit from skilled physical therapy to improve balance, gait mechanics, and overall functional mobility    Personal Factors and Comorbidities Age;Comorbidity 3+;Fitness;Past/Current Experience;Time since onset of injury/illness/exacerbation;Transportation    Comorbidities Parkinson's with autonomic instability, asthma, GERD depression, degenerative spondylolisthesis, focal seizures and migraine syndrome    Examination-Activity Limitations Bathing;Bed Mobility;Bend;Caring for [employer];Locomotion  Level;Lift;Hygiene/Grooming;Squat;Stairs;Stand;Toileting;Transfers    Examination-Participation Restrictions Church;Cleaning;Community Activity;Driving;Meal Prep;Medication Management;Laundry;Shop;Volunteer;Yard Work    Stability/Clinical Decision Making Unstable/Unpredictable    Rehab Potential Fair    PT Frequency 2x / week    PT Duration 12 weeks    PT Treatment/Interventions ADLs/Self Care Home Management;Biofeedback;Canalith Repostioning;Cryotherapy;Parrafin;Ultrasound;Traction;Moist Heat;Iontophoresis 4mg /ml Dexamethasone;Electrical Stimulation;DME Instruction;Gait training;Stair training;Functional mobility training;Neuromuscular re-education;Balance training;Therapeutic exercise;Therapeutic activities;Patient/family education;Orthotic Fit/Training;Manual techniques;Passive range of motion;Manual lymph drainage;Energy conservation;Splinting;Taping;Vasopneumatic Device;Vestibular;Joint Manipulations;Visual/perceptual remediation/compensation    PT Next Visit Plan walk with head turns    PT Home Exercise Plan to give next session    Consulted and Agree with Plan of Care Patient;Family member/caregiver    Family Member Consulted wife             Patient will benefit from skilled therapeutic intervention in order to improve the following deficits and impairments:  Abnormal gait, Decreased activity tolerance, Decreased balance, Decreased knowledge of precautions, Decreased endurance, Decreased coordination, Decreased cognition, Decreased knowledge of use of DME, Decreased mobility, Decreased safety awareness, Difficulty walking, Decreased strength, Impaired flexibility, Impaired perceived functional ability, Impaired sensation, Impaired tone, Impaired UE functional use, Impaired vision/preception, Improper body mechanics, Postural dysfunction  Visit Diagnosis: Other abnormalities of gait and mobility  Unsteadiness on feet     Problem List Patient Active Problem List   Diagnosis Date  Noted   Focal seizure ([place]) 05/24/2020   Parkinson's disease ([place])    History of migraine headaches    Migraine syndrome    BPH (benign prostatic hyperplasia)    Depression    Degenerative spondylolisthesis 05/07/2016    Janna Arch, PT, DPT  09/30/2020, 11:54 AM  [place] MAIN [facility] SERVICES [address] [place], [place], [address] Phone: [phone]   Fax:  [phone]  Name: Cory [name] MRN: [id number] Date of Birth: 10/17/1955

## 2020-09-30 NOTE — Therapy (Signed)
[place] MAIN [facility] SERVICES [address] [place], [place], [address] Phone: [phone]   Fax:  [phone]  Occupational Therapy Evaluation  Patient Details  Name: Cory [name] MRN: [id number] Date of Birth: [birth year] No data recorded  Encounter Date: 09/30/2020   OT End of Session - 09/30/20 1132     Visit Number 1    Number of Visits 24    Date for OT Re-Evaluation 12/22/20    OT Start Time 31    OT Stop Time 1105    OT Time Calculation (min) 63 min    Equipment Utilized During Treatment 4 wheeled walker    Activity Tolerance Patient tolerated treatment well    Behavior During Therapy WFL for tasks assessed/performed             Past Medical History:  Diagnosis Date   Arthritis    hips and back   Asthma    as a baby-    GERD (gastroesophageal reflux disease)    rare -    Parkinson's disease ([place])    Pneumonia    age 58   Rash    legs- on going    Past Surgical History:  Procedure Laterality Date   APPENDECTOMY     BACK SURGERY     posterior lumbar spine fusion L4-5   COLONOSCOPY     COLONOSCOPY WITH PROPOFOL N/A 06/06/2017   Procedure: COLONOSCOPY WITH PROPOFOL;  Surgeon: Manya Silvas, MD;  Location: [facility] ENDOSCOPY;  Service: Endoscopy;  Laterality: N/A;   KNEE ARTHROSCOPY Bilateral    ACL   KNEE ARTHROSCOPY W/ ACL RECONSTRUCTION     RADIAL KERATOTOMY     ROTATOR CUFF REPAIR Bilateral    SHOULDER ARTHROSCOPY WITH BICEPS TENDON REPAIR Left 04/16/2019   Procedure: SHOULDER ARTHROSCOPY WITH BICEPS TENDON REPAIR, MINI OPEN SUPERIOR CAPSULAR RECONSTRUCTION, BICEPS TENODESIS;  Surgeon: Leim Fabry, MD;  Location: [facility] ORS;  Service: Orthopedics;  Laterality: Left;    There were no vitals filed for this visit.  Occupational Therapy Evaluation: Pt is a 65 y/o male who resides with spouse in 1 level home with ramp to enter, 1 cat, 1 dog.  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  Pt enjoys shooting at his range behind his house, driving his jeep (though he is restricted from driving currently d/t seizures), and taking his dog for a walk.  Pt was diagnosed with Parkinson's in 2012 but reports he started noticing functional decline with ADLs/IADLs/mobility about 1 year ago.  Pt has had multiple falls, most recent one being a month ago, but has had no reported falls since Physical Therapy encouraged use of 4 wheeled walker instead of his single point cane more recently.  Pt presents with BUE weakness and GMC/FMC deficits.  Pt reports occasional diplopia, some difficulty with problem solving, multitasking, and visual processing.  Pt reports numbness in R hand fingertips which further limit [facility] tasks.  Hx of bilat rotator cuff surgeries with some residual shoulder weakness.  R dominant arm is more affected by Parkinson's as compared to L.  Pt acknowledges that he has "on and off days," today being an "on" day.  Pt reports frustration when he has to ask his wife to help him with cutting food, tying his shoes, combing his hair, or managing his clothing fasteners.  Pt's goal is to improve overall strength and coordination in order to maximize indep with  these tasks.      OT Education - 09/30/20 1132     Education Details Role of OT/poc/goals    Person(s) Educated Patient;Spouse    Methods Explanation;Verbal cues    Comprehension Verbalized understanding              OT Short Term Goals - 09/30/20 1210       OT SHORT TERM GOAL #1   Title Pt will perform BUE HEP with min vc    Baseline Eval: not yet initiated    Time 6    Period Weeks    Status New    Target Date 11/10/20      OT SHORT TERM GOAL #2   Title Pt will report RPE of 4 or better to indicate improved tolerance to basic ADLs.    Baseline Eval: RPE of 6 with basic ADLs.    Time 6    Period Weeks    Status New    Target Date 11/10/20      OT SHORT TERM GOAL #3   Title  Pt will utilize visual compensation strategies with min vc in order to improve basic and IADL performance.    Baseline Eval: pt reports some diplopia and decreased depth perception which limits ADL/IADL performance.    Time 6    Period Weeks    Status New    Target Date 11/10/20               OT Long Term Goals - 09/30/20 1138       OT LONG TERM GOAL #1   Title Pt will increase bilat shoulder strength by 1MM grade in order to improve tolerance to UB ADLs/IADLs and reaching activities.    Baseline Eval: R/L shoulder flex/abd 4-/5, R/L shoulder ER 3+/5    Time 12    Period Weeks    Status New    Target Date 12/22/20      OT LONG TERM GOAL #2   Title Pt will manage clothing fasteners and looping belt with modified indep.    Baseline Eval: spouse assists.  Difficulty with tying shoes, looping belt, snapping and buttoning buttons.    Time 12    Period Weeks    Status New    Target Date 12/22/20      OT LONG TERM GOAL #3   Title Pt will cut food with modified indep.    Baseline Eval: spouse assists to cut food    Time 12    Period Weeks    Status New    Target Date 12/22/20      OT LONG TERM GOAL #4   Title Pt will brush hair with R dominant arm with modified indep.    Baseline Eval: Pt states he must hold R arm up with L hand, and often spouse must assist.    Time 12    Period Weeks    Status New    Target Date 12/22/20      OT LONG TERM GOAL #5   Title Pt will increase FOTO score to 62 or better to indicate measureable functional improvement with ADLs/IADLs.    Baseline Eval: FOTO score of 58    Time 12    Period Weeks    Status New    Target Date 12/22/20             [facility] OT Assessment - 09/30/20 1213       Assessment   Medical Diagnosis Parkinson's  Hand Dominance Right    Prior Therapy BIG and LOUD, but no OT      Precautions   Precautions Fall      Restrictions   Weight Bearing Restrictions No      Balance Screen   Has the patient fallen  in the past 6 months Yes   last fall was about 1 month ago, but no falls since pt has started using his 4 wheeled walker     Home  Environment   Additional Comments Lives with spouse in 1 level home, 1 dog, 1 cat.  Ramp to enter.      Prior Function   Level of Independence Independent with basic ADLs   indep with ADLs and mobility about 1 year ago.   Vocation Retired    Leisure Shoot at his range out back, walk his dog      ADL   ADL comments Pt performs basic ADLs with set up-min A.  Min A when pt is fatigued or is having an "off" day.  Difficulty with clothing fasteners and cutting food.      Mobility   Mobility Status History of falls      Vision - History   Additional Comments pt reports some diplopia, does have reading glasses      Vision Assessment   Depth Perception impaired      Activity Tolerance   Activity Tolerance Tolerates 10-20 min activity with multiple rests    Activity Tolerance Comments RPE 6 with basic ADLs (moderate difficulty)      Cognition   Cognition Comments pt reports decreased problem solving and slower processing      Observation/Other Assessments   Focus on Therapeutic Outcomes (FOTO)  58 for the upper extremity neuro      Posture/Postural Control   Postural Limitations Rounded Shoulders      Sensation   Light Touch Impaired by gross assessment   pt reports fingertips on R hand are numb     Coordination   Gross Motor Movements are Fluid and Coordinated No    Fine Motor Movements are Fluid and Coordinated No    Right 9 Hole Peg Test 45 sec    Left 9 Hole Peg Test 36 sec    Tremors BUEs      AROM   Overall AROM Comments R active shoulder flexion 0-125, L 0-120, R ER 0-40, L 0-52      Strength   Overall Strength Comments R/L shoulder flex/abd 4-/5, R/L ER 3+/5      Hand Function   Right Hand Grip (lbs) 64    Right Hand Lateral Pinch 18 lbs    Right Hand 3 Point Pinch 18 lbs    Left Hand Grip (lbs) 59    Left Hand Lateral Pinch 20 lbs     Left 3 point pinch 17 lbs             Plan - 09/30/20 1201     Clinical Impression Statement Pt is a 65 y/o male who resides with spouse in 1 level home with ramp to enter, 1 cat, 1 dog.  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  Pt enjoys shooting at his range behind his house, driving his jeep (though he is restricted from driving currently d/t seizures), and taking his dog for a walk.  Pt was diagnosed with Parkinson's in 2012 but reports he started noticing functional decline with ADLs/IADLs/mobility  about 1 year ago.  Pt has had multiple falls, most recent one being a month ago, but has had no reported falls since Physical Therapy encouraged use of 4 wheeled walker instead of his single point cane more recently.  Pt presents with BUE weakness and GMC/FMC deficits.  Pt reports occasional diplopia, some difficulty with problem solving, multitasking, and visual processing.  Pt reports numbness in R hand fingertips which further limit [facility] tasks.  Hx of bilat rotator cuff surgeries with some residual shoulder weakness.  R dominant arm is more affected by Parkinson's as compared to L.  Pt acknowledges that he has "on and off days," today being an "on" day.  Pt reports frustration when he has to ask his wife to help him with cutting food, tying his shoes, combing his hair, or managing his clothing fasteners.  Pt's goal is to improve overall strength and coordination in order to maximize indep with these tasks.    OT Occupational Profile and History Detailed Assessment- Review of Records and additional review of physical, cognitive, psychosocial history related to current functional performance    Occupational performance deficits (Please refer to evaluation for details): ADL's;IADL's;Leisure    Body Structure / Function / Physical Skills ADL;Coordination;Endurance;GMC;UE functional  use;Balance;Sensation;IADL;Vision;Dexterity;FMC;Strength;Continence;Gait;Mobility    Rehab Potential Good    Clinical Decision Making Several treatment options, min-mod task modification necessary    Comorbidities Affecting Occupational Performance: May have comorbidities impacting occupational performance    Modification or Assistance to Complete Evaluation  Min-Moderate modification of tasks or assist with assess necessary to complete eval    OT Frequency 2x / week    OT Duration 12 weeks    OT Treatment/Interventions Self-care/ADL training;Therapeutic exercise;DME and/or AE instruction;Balance training;Neuromuscular education;Manual Therapy;Visual/perceptual remediation/compensation;Therapeutic activities;Patient/family education    Consulted and Agree with Plan of Care Patient;Family member/caregiver              Patient will benefit from skilled therapeutic intervention in order to improve the following deficits and impairments:   Body Structure / Function / Physical Skills: ADL, Coordination, Endurance, GMC, UE functional use, Balance, Sensation, IADL, Vision, Dexterity, FMC, Strength, Continence, Gait, Mobility       Visit Diagnosis: Muscle weakness (generalized)  Other lack of coordination    Problem List Patient Active Problem List   Diagnosis Date Noted   Focal seizure ([place]) 05/24/2020   Parkinson's disease ([place])    History of migraine headaches    Migraine syndrome    BPH (benign prostatic hyperplasia)    Depression    Degenerative spondylolisthesis 05/07/2016   Leta Speller, MS, OTR/L  Darleene Cleaver 09/30/2020, 12:43 PM  [place] MAIN [facility] SERVICES [address] [place], [place], [address] Phone: [phone]   Fax:  [phone]  Name: KC SUMMERSON MRN: [id number] Date of Birth: 02-05-1956

## 2020-10-02 ENCOUNTER — Other Ambulatory Visit: Payer: Self-pay

## 2020-10-02 ENCOUNTER — Ambulatory Visit: Payer: Medicare PPO

## 2020-10-02 DIAGNOSIS — M6281 Muscle weakness (generalized): Secondary | ICD-10-CM

## 2020-10-02 DIAGNOSIS — R2681 Unsteadiness on feet: Secondary | ICD-10-CM

## 2020-10-02 DIAGNOSIS — R278 Other lack of coordination: Secondary | ICD-10-CM

## 2020-10-02 DIAGNOSIS — R2689 Other abnormalities of gait and mobility: Secondary | ICD-10-CM | POA: Diagnosis not present

## 2020-10-02 NOTE — Therapy (Signed)
[place] MAIN [facility] SERVICES [address] [place], [place], [address] Phone: [phone]   Fax:  [phone]  Occupational Therapy Treatment  Patient Details  Name: Cory [name] MRN: [id number] Date of Birth: [birth year] No data recorded  Encounter Date: 10/02/2020   OT End of Session - 10/02/20 1255     Visit Number 2    Number of Visits 24    Date for OT Re-Evaluation 12/22/20    OT Start Time 1005    OT Stop Time 1050    OT Time Calculation (min) 45 min    Equipment Utilized During Treatment 4 wheeled walker    Activity Tolerance Patient tolerated treatment well    Behavior During Therapy WFL for tasks assessed/performed             Past Medical History:  Diagnosis Date   Arthritis    hips and back   Asthma    as a baby-    GERD (gastroesophageal reflux disease)    rare -    Parkinson's disease ([place])    Pneumonia    age 40   Rash    legs- on going    Past Surgical History:  Procedure Laterality Date   APPENDECTOMY     BACK SURGERY     posterior lumbar spine fusion L4-5   COLONOSCOPY     COLONOSCOPY WITH PROPOFOL N/A 06/06/2017   Procedure: COLONOSCOPY WITH PROPOFOL;  Surgeon: Manya Silvas, MD;  Location: [facility] ENDOSCOPY;  Service: Endoscopy;  Laterality: N/A;   KNEE ARTHROSCOPY Bilateral    ACL   KNEE ARTHROSCOPY W/ ACL RECONSTRUCTION     RADIAL KERATOTOMY     ROTATOR CUFF REPAIR Bilateral    SHOULDER ARTHROSCOPY WITH BICEPS TENDON REPAIR Left 04/16/2019   Procedure: SHOULDER ARTHROSCOPY WITH BICEPS TENDON REPAIR, MINI OPEN SUPERIOR CAPSULAR RECONSTRUCTION, BICEPS TENODESIS;  Surgeon: Leim Fabry, MD;  Location: [facility] ORS;  Service: Orthopedics;  Laterality: Left;    There were no vitals filed for this visit.   Subjective Assessment - 10/02/20 1252     Subjective  "My shoulders are a little sore today."    Patient is accompanied by: Family member    Pertinent History Parkinson's Disease, R sided  weakness, falls    Limitations balance, strength, coordination, visual perception, problem solving, processing information    Patient Stated Goals "I want to get stronger.  It frustrates me when I have to ask for help with things like cutting my food or buttoning a button."    Currently in Pain? Yes    Pain Score 4     Pain Location Shoulder    Pain Orientation Right;Left    Pain Descriptors / Indicators Aching;Sore;Tightness    Pain Type Chronic pain    Pain Onset More than a month ago    Pain Frequency Intermittent    Multiple Pain Sites No            Occupational Therapy Treatment: Therapeutic Exercise: Pt participated in BUE strengthening and activity tolerance with UBE in standing x6 min with moderate resistance, forward 3 min, backward 3 min.  Completed 1.5 lb resistance dowel exericse for bilat shoulder flex, ER, chest press, 3 lb dowel for bilat wrist flex/ext, 5 lb dumbbell for forearm pron/sup and elbow flex all 2 sets 10 reps each.  Pt required therapist assist to achieve end ROM for shoulder flex and ER with dowel, and to minimize compensatory shoulder hiking and lateral leaning at torso.  Performed passive stretching to bilat shoulders for flexion, abd, and ER with hands behind head.  Instructed pt in rolling up towel and placing vertically along spine using this to stretch back and retraction of shoulders when reclined in chair.  Pt verbalized understanding.   Self Care: Unstrung pt's shoe laces and replaced with elastic laces with locks.  Pt required mod A to unstring 1 shoe.  Able to don shoes with vc to utilize long shoe horn.  Pt reported he has a shorter shoe horn at home but liked the long one.  OT advised on options to obtain.  Pt able to don/doff both shoes with new laces with modified indep following initial instruction with shoe horn and was pleased with the new elastic laces.   Response to Treatment: Bilateral shoulder weakness and stiffness limit pt from achieving  end ROM independently, which limits functional reach over head and UB ADLs.  Pt tolerated strengthening with rest breaks and intermittent min A to reduce compensatory movements.  Pt pleased with new elastic laces which allowed him to don/doff shoes with modified indep and better efficiency.  Pt will benefit from continued Occupational Therapy to address deficits noted above in order to maximize indep with ADLs/IADLs/leisure.      OT Education - 10/02/20 1254     Education Details ADL strategies with elastic laces and long handled shoe horn    Person(s) Educated Patient;Spouse    Methods Explanation;Verbal cues;Demonstration    Comprehension Verbalized understanding;Returned demonstration              OT Short Term Goals - 09/30/20 1210       OT SHORT TERM GOAL #1   Title Pt will perform BUE HEP with min vc    Baseline Eval: not yet initiated    Time 6    Period Weeks    Status New    Target Date 11/10/20      OT SHORT TERM GOAL #2   Title Pt will report RPE of 4 or better to indicate improved tolerance to basic ADLs.    Baseline Eval: RPE of 6 with basic ADLs.    Time 6    Period Weeks    Status New    Target Date 11/10/20      OT SHORT TERM GOAL #3   Title Pt will utilize visual compensation strategies with min vc in order to improve basic and IADL performance.    Baseline Eval: pt reports some diplopia and decreased depth perception which limits ADL/IADL performance.    Time 6    Period Weeks    Status New    Target Date 11/10/20               OT Long Term Goals - 09/30/20 1138       OT LONG TERM GOAL #1   Title Pt will increase bilat shoulder strength by 1MM grade in order to improve tolerance to UB ADLs/IADLs and reaching activities.    Baseline Eval: R/L shoulder flex/abd 4-/5, R/L shoulder ER 3+/5    Time 12    Period Weeks    Status New    Target Date 12/22/20      OT LONG TERM GOAL #2   Title Pt will manage clothing fasteners and looping belt with  modified indep.    Baseline Eval: spouse assists.  Difficulty with tying shoes, looping belt, snapping and buttoning buttons.    Time 12    Period Weeks  Status New    Target Date 12/22/20      OT LONG TERM GOAL #3   Title Pt will cut food with modified indep.    Baseline Eval: spouse assists to cut food    Time 12    Period Weeks    Status New    Target Date 12/22/20      OT LONG TERM GOAL #4   Title Pt will brush hair with R dominant arm with modified indep.    Baseline Eval: Pt states he must hold R arm up with L hand, and often spouse must assist.    Time 12    Period Weeks    Status New    Target Date 12/22/20      OT LONG TERM GOAL #5   Title Pt will increase FOTO score to 62 or better to indicate measureable functional improvement with ADLs/IADLs.    Baseline Eval: FOTO score of 58    Time 12    Period Weeks    Status New    Target Date 12/22/20               Plan - 10/02/20 1316     Clinical Impression Statement Bilateral shoulder weakness and stiffness limit pt from achieving end ROM independently, which limits functional reach over head and UB ADLs.  Pt tolerated strengthening with rest breaks and intermittent min A to reduce compensatory movements.  Pt pleased with new elastic laces which allowed him to don/doff shoes with modified indep and better efficiency.  Pt will benefit from continued Occupational Therapy to address deficits noted above in order to maximize indep with ADLs/IADLs/leisure.    OT Occupational Profile and History Detailed Assessment- Review of Records and additional review of physical, cognitive, psychosocial history related to current functional performance    Occupational performance deficits (Please refer to evaluation for details): ADL's;IADL's;Leisure    Body Structure / Function / Physical Skills ADL;Coordination;Endurance;GMC;UE functional use;Balance;Sensation;IADL;Vision;Dexterity;FMC;Strength;Continence;Gait;Mobility    Rehab  Potential Good    Clinical Decision Making Several treatment options, min-mod task modification necessary    Comorbidities Affecting Occupational Performance: May have comorbidities impacting occupational performance    Modification or Assistance to Complete Evaluation  Min-Moderate modification of tasks or assist with assess necessary to complete eval    OT Frequency 2x / week    OT Duration 12 weeks    OT Treatment/Interventions Self-care/ADL training;Therapeutic exercise;DME and/or AE instruction;Balance training;Neuromuscular education;Manual Therapy;Visual/perceptual remediation/compensation;Therapeutic activities;Patient/family education    Consulted and Agree with Plan of Care Patient;Family member/caregiver             Patient will benefit from skilled therapeutic intervention in order to improve the following deficits and impairments:   Body Structure / Function / Physical Skills: ADL, Coordination, Endurance, GMC, UE functional use, Balance, Sensation, IADL, Vision, Dexterity, FMC, Strength, Continence, Gait, Mobility     Visit Diagnosis: Muscle weakness (generalized)  Other lack of coordination  Problem List Patient Active Problem List   Diagnosis Date Noted   Focal seizure ([place]) 05/24/2020   Parkinson's disease ([place])    History of migraine headaches    Migraine syndrome    BPH (benign prostatic hyperplasia)    Depression    Degenerative spondylolisthesis 05/07/2016   Leta Speller, MS, OTR/L  Darleene Cleaver 10/02/2020, 1:17 PM  [place] [address] [place], [place], [address] Phone: [phone]   Fax:  [phone]  Name: VIVIANO BIR MRN: [id number] Date  of Birth: October 22, 1955

## 2020-10-02 NOTE — Therapy (Signed)
[place] MAIN [facility] SERVICES [address] [place], [place], [address] Phone: [phone]   Fax:  [phone]  Physical Therapy Treatment  Patient Details  Name: Cory [name] MRN: [id number] Date of Birth: 01-Jul-1955 Referring Provider (PT): Benay Spice   Encounter Date: 10/02/2020   PT End of Session - 10/02/20 1401     Visit Number 5    Number of Visits 24    Date for PT Re-Evaluation 12/11/20    Authorization Type 5/10 eval 6/16    PT Start Time 0845    PT Stop Time 0930    PT Time Calculation (min) 45 min    Equipment Utilized During Treatment Gait belt    Activity Tolerance Patient tolerated treatment well    Behavior During Therapy WFL for tasks assessed/performed             Past Medical History:  Diagnosis Date   Arthritis    hips and back   Asthma    as a baby-    GERD (gastroesophageal reflux disease)    rare -    Parkinson's disease ([place])    Pneumonia    age 65   Rash    legs- on going    Past Surgical History:  Procedure Laterality Date   APPENDECTOMY     BACK SURGERY     posterior lumbar spine fusion L4-5   COLONOSCOPY     COLONOSCOPY WITH PROPOFOL N/A 06/06/2017   Procedure: COLONOSCOPY WITH PROPOFOL;  Surgeon: Manya Silvas, MD;  Location: [facility] ENDOSCOPY;  Service: Endoscopy;  Laterality: N/A;   KNEE ARTHROSCOPY Bilateral    ACL   KNEE ARTHROSCOPY W/ ACL RECONSTRUCTION     RADIAL KERATOTOMY     ROTATOR CUFF REPAIR Bilateral    SHOULDER ARTHROSCOPY WITH BICEPS TENDON REPAIR Left 04/16/2019   Procedure: SHOULDER ARTHROSCOPY WITH BICEPS TENDON REPAIR, MINI OPEN SUPERIOR CAPSULAR RECONSTRUCTION, BICEPS TENODESIS;  Surgeon: Leim Fabry, MD;  Location: [facility] ORS;  Service: Orthopedics;  Laterality: Left;    There were no vitals filed for this visit.   Subjective Assessment - 10/02/20 1400     Subjective Patient reports no falls or LOB since last session. Is interested in Parkinson's support  groups    Patient is accompained by: Family member    Pertinent History Patient admitted to hospital 05/23/20 with code stroke and R sided weakness. Imaging negative for acute changes. PMH includes Parkinson's with autonomic instability, asthma, GERD depression, degenerative spondylolisthesis, focal seizures and migraine syndrome. Patient had three ED visits for falls since then. Patient reports when he has the seizures he has pain in his temples and bright flashes in his vision. Last week had three falls in one day.    Limitations Lifting;Standing;Walking;House hold activities    How long can you sit comfortably? sleeps in easy chair.    How long can you stand comfortably? feel unsteady once in standing.    How long can you walk comfortably? difficult to walk during "off period"    Patient Stated Goals stability, have a device that will be most helpful, help to become and stay independent.    Currently in Pain? Yes    Pain Score 4     Pain Location Shoulder    Pain Orientation Right;Left    Pain Descriptors / Indicators Aching    Pain Type Chronic pain    Pain Onset More than a month ago    Pain Frequency Intermittent  Treat:   Neuro Re-ed Standing with CGA next to support surface:   Airex pad one foot on airex pad one foot on soccer ball clockwise circles 10x each LE 10x counterclockwise; BUE support for stabilization provided. Airex pad: dynadisc 30 second holds each LE placement; 2 x each LE placement.     3 way cone taps; 12x each LE; requires BUE support, challenging to tap backwards  Gait speed change: Red to orange : slow exaggerated steps; orange to yellow:normal gait speed;  yellow to green cone fast quick pace ambulation ; first set with RW ambulating x2 sets. Third through eight set with single hand support and CGA. Challenging with turning and first step after turn.     TherEx: Squat jumps 15x with BUE support Forward modified lunges with high knee drive  to sky 87O each LE; patient reports challenging to hard.  Lateral squat with lunges 12x with BUE support  12" step: toe taps, no UE support, 30 seconds x2 trials   Sit to stand with weighted ball press (10x) with 2000 gr ball     Pt educated throughout session about proper posture and technique with exercises. Improved exercise technique, movement at target joints, use of target muscles after min to mod verbal, visual, tactile cues   Patient educated on and performed gait speed changes and power movements with good control. He is challenged with initial steps after turning requiring cue of "big step" to decrease shuffle steppage. Patient is highly motivated and eager to progress indicating good prognosis. Quick speed movements are improving with patient challenged with maintaining amplitude of movement however is now only noticeable with last reps. Patient will benefit from skilled physical therapy to improve balance, gait mechanics, and overall functional mobility                      PT Education - 10/02/20 1401     Education Details exercise technique, change of gait speed    Person(s) Educated Patient    Methods Explanation;Demonstration;Tactile cues;Verbal cues    Comprehension Verbalized understanding;Returned demonstration;Verbal cues required;Tactile cues required              PT Short Term Goals - 09/18/20 2057       PT SHORT TERM GOAL #1   Title Patient will be independent in home exercise program to improve strength/mobility for better functional independence with ADLs.    Baseline 6/16: HEP to be given next session    Time 4    Period Weeks    Status New    Target Date 10/16/20               PT Long Term Goals - 09/18/20 2057       PT LONG TERM GOAL #1   Title Patient will increase FOTO score to equal to or greater than 51%    to demonstrate statistically significant improvement in mobility and quality of life.    Baseline 6/16: 48%     Time 12    Period Weeks    Status New    Target Date 12/11/20      PT LONG TERM GOAL #2   Title Patient will increase Berg Balance score by > 6 points (34/56)  to demonstrate decreased fall risk during functional activities.    Baseline 6/16: 28/56    Time 12    Period Weeks    Status New    Target Date 12/11/20      PT  LONG TERM GOAL #3   Title Patient will deny any falls over past 4 weeks to demonstrate improved safety awareness at home and work.    Baseline 6/16: multiple falls    Time 12    Period Weeks    Status New    Target Date 12/11/20      PT LONG TERM GOAL #4   Title Patient will increase six minute walk test distance to >1300 for progression to age norm community ambulator and improve gait ability    Baseline 6/16: 945 ft with multiple near falls    Time 12    Period Weeks    Status New    Target Date 12/11/20                   Plan - 10/02/20 1405     Clinical Impression Statement Patient educated on and performed gait speed changes and power movements with good control. He is challenged with initial steps after turning requiring cue of "big step" to decrease shuffle steppage. Patient is highly motivated and eager to progress indicating good prognosis. Quick speed movements are improving with patient challenged with maintaining amplitude of movement however is now only noticeable with last reps. Patient will benefit from skilled physical therapy to improve balance, gait mechanics, and overall functional mobility    Personal Factors and Comorbidities Age;Comorbidity 3+;Fitness;Past/Current Experience;Time since onset of injury/illness/exacerbation;Transportation    Comorbidities Parkinson's with autonomic instability, asthma, GERD depression, degenerative spondylolisthesis, focal seizures and migraine syndrome    Examination-Activity Limitations Bathing;Bed Mobility;Bend;Caring for [employer];Locomotion  Level;Lift;Hygiene/Grooming;Squat;Stairs;Stand;Toileting;Transfers    Examination-Participation Restrictions Church;Cleaning;Community Activity;Driving;Meal Prep;Medication Management;Laundry;Shop;Volunteer;Yard Work    Stability/Clinical Decision Making Unstable/Unpredictable    Rehab Potential Fair    PT Frequency 2x / week    PT Duration 12 weeks    PT Treatment/Interventions ADLs/Self Care Home Management;Biofeedback;Canalith Repostioning;Cryotherapy;Parrafin;Ultrasound;Traction;Moist Heat;Iontophoresis 4mg /ml Dexamethasone;Electrical Stimulation;DME Instruction;Gait training;Stair training;Functional mobility training;Neuromuscular re-education;Balance training;Therapeutic exercise;Therapeutic activities;Patient/family education;Orthotic Fit/Training;Manual techniques;Passive range of motion;Manual lymph drainage;Energy conservation;Splinting;Taping;Vasopneumatic Device;Vestibular;Joint Manipulations;Visual/perceptual remediation/compensation    PT Next Visit Plan walk with head turns    PT Home Exercise Plan to give next session    Consulted and Agree with Plan of Care Patient;Family member/caregiver    Family Member Consulted wife             Patient will benefit from skilled therapeutic intervention in order to improve the following deficits and impairments:  Abnormal gait, Decreased activity tolerance, Decreased balance, Decreased knowledge of precautions, Decreased endurance, Decreased coordination, Decreased cognition, Decreased knowledge of use of DME, Decreased mobility, Decreased safety awareness, Difficulty walking, Decreased strength, Impaired flexibility, Impaired perceived functional ability, Impaired sensation, Impaired tone, Impaired UE functional use, Impaired vision/preception, Improper body mechanics, Postural dysfunction  Visit Diagnosis: Muscle weakness (generalized)  Other abnormalities of gait and mobility  Unsteadiness on feet     Problem List Patient Active  Problem List   Diagnosis Date Noted   Focal seizure ([place]) 05/24/2020   Parkinson's disease ([place])    History of migraine headaches    Migraine syndrome    BPH (benign prostatic hyperplasia)    Depression    Degenerative spondylolisthesis 05/07/2016   Janna Arch, PT, DPT   10/02/2020, 2:06 PM  [place] MAIN [facility] SERVICES [address] [place], [place], [address] Phone: [phone]   Fax:  [phone]  Name: Cory [name] MRN: [id number] Date of Birth: Aug 23, 1955

## 2020-10-07 ENCOUNTER — Other Ambulatory Visit: Payer: Self-pay

## 2020-10-07 ENCOUNTER — Ambulatory Visit: Payer: Medicare PPO | Attending: Neurology

## 2020-10-07 DIAGNOSIS — R2681 Unsteadiness on feet: Secondary | ICD-10-CM | POA: Diagnosis present

## 2020-10-07 DIAGNOSIS — R278 Other lack of coordination: Secondary | ICD-10-CM | POA: Diagnosis present

## 2020-10-07 DIAGNOSIS — R2689 Other abnormalities of gait and mobility: Secondary | ICD-10-CM | POA: Insufficient documentation

## 2020-10-07 DIAGNOSIS — M6281 Muscle weakness (generalized): Secondary | ICD-10-CM | POA: Diagnosis present

## 2020-10-07 NOTE — Therapy (Signed)
[place] MAIN [facility] SERVICES [address] [place], [place], [address] Phone: [phone]   Fax:  [phone]  Physical Therapy Treatment  Patient Details  Name: Cory [name] MRN: [id number] Date of Birth: September 17, 1955 Referring Provider (PT): Benay Spice   Encounter Date: 10/07/2020   PT End of Session - 10/07/20 1206     Visit Number 6    Number of Visits 24    Date for PT Re-Evaluation 12/11/20    Authorization Type 6/10 eval 6/16    PT Start Time 0800    PT Stop Time 0844    PT Time Calculation (min) 44 min    Equipment Utilized During Treatment Gait belt    Activity Tolerance Patient tolerated treatment well    Behavior During Therapy WFL for tasks assessed/performed             Past Medical History:  Diagnosis Date   Arthritis    hips and back   Asthma    as a baby-    GERD (gastroesophageal reflux disease)    rare -    Parkinson's disease ([place])    Pneumonia    age 62   Rash    legs- on going    Past Surgical History:  Procedure Laterality Date   APPENDECTOMY     BACK SURGERY     posterior lumbar spine fusion L4-5   COLONOSCOPY     COLONOSCOPY WITH PROPOFOL N/A 06/06/2017   Procedure: COLONOSCOPY WITH PROPOFOL;  Surgeon: Manya Silvas, MD;  Location: [facility] ENDOSCOPY;  Service: Endoscopy;  Laterality: N/A;   KNEE ARTHROSCOPY Bilateral    ACL   KNEE ARTHROSCOPY W/ ACL RECONSTRUCTION     RADIAL KERATOTOMY     ROTATOR CUFF REPAIR Bilateral    SHOULDER ARTHROSCOPY WITH BICEPS TENDON REPAIR Left 04/16/2019   Procedure: SHOULDER ARTHROSCOPY WITH BICEPS TENDON REPAIR, MINI OPEN SUPERIOR CAPSULAR RECONSTRUCTION, BICEPS TENODESIS;  Surgeon: Leim Fabry, MD;  Location: [facility] ORS;  Service: Orthopedics;  Laterality: Left;    There were no vitals filed for this visit.   Subjective Assessment - 10/07/20 1204     Subjective Patient reports he hasn't fallen since last session. Has been compliant with HEP.     Patient is accompained by: Family member    Pertinent History Patient admitted to hospital 05/23/20 with code stroke and R sided weakness. Imaging negative for acute changes. PMH includes Parkinson's with autonomic instability, asthma, GERD depression, degenerative spondylolisthesis, focal seizures and migraine syndrome. Patient had three ED visits for falls since then. Patient reports when he has the seizures he has pain in his temples and bright flashes in his vision. Last week had three falls in one day.    Limitations Lifting;Standing;Walking;House hold activities    How long can you sit comfortably? sleeps in easy chair.    How long can you stand comfortably? feel unsteady once in standing.    How long can you walk comfortably? difficult to walk during "off period"    Patient Stated Goals stability, have a device that will be most helpful, help to become and stay independent.    Currently in Pain? No/denies                      Treat:   Neuro Re-ed Standing with CGA next to support surface:   Bosu ball: flat side up static stand 30    Hallway:  Rainbow ball vertical toss 2x 86 ft  with close CGA for head turns with dual task Rainbow ball bounce 2x 86 ft with close CGA  Rainbow ball lateral pass/twist 2x 86 ft .   red light green light for sudden termination and initiation ambulation ; forward/backwards 2x 86 ft   Resisted ambulation 12.5 lb 3x side stepping L and R  Standing posture in mirror; bringing left shoulder down and tilting head back to neutral from the R sidebend 2x 30 seconds   TherEx: Squat jumps from chair 5x  Forward modified lunges 10x each LE; added to HEP  10x STS with focus on nose over toes added to HEP  Standing 4 way 10x each LE ; BUE support added to HEP    Pt educated throughout session about proper posture and technique with exercises. Improved exercise technique, movement at target joints, use of target muscles after min to mod verbal, visual,  tactile cues   Access Code: [email or web address] URL: [email or web address] Date: 10/07/2020 Prepared by: Janna Arch  Exercises Seated Scapular Retraction - 1 x daily - 7 x weekly - 2 sets - 10 reps - 5 hold Sit to Stand Without Arm Support - 1 x daily - 7 x weekly - 2 sets - 10 reps - 5 hold Lunge with Counter Support - 1 x daily - 7 x weekly - 2 sets - 10 reps - 5 hold Standing 4-Way Leg Reach with Counter Support - 1 x daily - 7 x weekly - 2 sets - 10 reps - 5 hold    Patient is given new progressive HEP program and tolerates it well. He is able to perform coordinated dual task interventions with minimal loss of balance. He is challenged with maintaining upright posture with L shoulder raise and right head tilt requiring mirror for correction. Patient will benefit from skilled physical therapy to improve balance, gait mechanics, and overall functional mobility        PT Education - 10/07/20 1205     Education Details exercise technique, body mechanics    Person(s) Educated Patient    Methods Explanation;Demonstration;Tactile cues;Verbal cues    Comprehension Verbalized understanding;Returned demonstration;Verbal cues required;Tactile cues required              PT Short Term Goals - 09/18/20 2057       PT SHORT TERM GOAL #1   Title Patient will be independent in home exercise program to improve strength/mobility for better functional independence with ADLs.    Baseline 6/16: HEP to be given next session    Time 4    Period Weeks    Status New    Target Date 10/16/20               PT Long Term Goals - 09/18/20 2057       PT LONG TERM GOAL #1   Title Patient will increase FOTO score to equal to or greater than 51%    to demonstrate statistically significant improvement in mobility and quality of life.    Baseline 6/16: 48%    Time 12    Period Weeks    Status New    Target Date 12/11/20      PT LONG TERM GOAL #2   Title Patient will increase Berg  Balance score by > 6 points (34/56)  to demonstrate decreased fall risk during functional activities.    Baseline 6/16: 28/56    Time 12    Period Weeks    Status New    Target Date 12/11/20  PT LONG TERM GOAL #3   Title Patient will deny any falls over past 4 weeks to demonstrate improved safety awareness at home and work.    Baseline 6/16: multiple falls    Time 12    Period Weeks    Status New    Target Date 12/11/20      PT LONG TERM GOAL #4   Title Patient will increase six minute walk test distance to >1300 for progression to age norm community ambulator and improve gait ability    Baseline 6/16: 945 ft with multiple near falls    Time 12    Period Weeks    Status New    Target Date 12/11/20                   Plan - 10/07/20 1221     Clinical Impression Statement Patient is given new progressive HEP program and tolerates it well. He is able to perform coordinated dual task interventions with minimal loss of balance. He is challenged with maintaining upright posture with L shoulder raise and right head tilt requiring mirror for correction. Patient will benefit from skilled physical therapy to improve balance, gait mechanics, and overall functional mobility    Personal Factors and Comorbidities Age;Comorbidity 3+;Fitness;Past/Current Experience;Time since onset of injury/illness/exacerbation;Transportation    Comorbidities Parkinson's with autonomic instability, asthma, GERD depression, degenerative spondylolisthesis, focal seizures and migraine syndrome    Examination-Activity Limitations Bathing;Bed Mobility;Bend;Caring for [employer];Locomotion Level;Lift;Hygiene/Grooming;Squat;Stairs;Stand;Toileting;Transfers    Examination-Participation Restrictions Church;Cleaning;Community Activity;Driving;Meal Prep;Medication Management;Laundry;Shop;Volunteer;Yard Work    Stability/Clinical Decision Making Unstable/Unpredictable    Rehab Potential  Fair    PT Frequency 2x / week    PT Duration 12 weeks    PT Treatment/Interventions ADLs/Self Care Home Management;Biofeedback;Canalith Repostioning;Cryotherapy;Parrafin;Ultrasound;Traction;Moist Heat;Iontophoresis 4mg /ml Dexamethasone;Electrical Stimulation;DME Instruction;Gait training;Stair training;Functional mobility training;Neuromuscular re-education;Balance training;Therapeutic exercise;Therapeutic activities;Patient/family education;Orthotic Fit/Training;Manual techniques;Passive range of motion;Manual lymph drainage;Energy conservation;Splinting;Taping;Vasopneumatic Device;Vestibular;Joint Manipulations;Visual/perceptual remediation/compensation    PT Next Visit Plan walk with head turns    PT Home Exercise Plan to give next session    Consulted and Agree with Plan of Care Patient;Family member/caregiver    Family Member Consulted wife             Patient will benefit from skilled therapeutic intervention in order to improve the following deficits and impairments:  Abnormal gait, Decreased activity tolerance, Decreased balance, Decreased knowledge of precautions, Decreased endurance, Decreased coordination, Decreased cognition, Decreased knowledge of use of DME, Decreased mobility, Decreased safety awareness, Difficulty walking, Decreased strength, Impaired flexibility, Impaired perceived functional ability, Impaired sensation, Impaired tone, Impaired UE functional use, Impaired vision/preception, Improper body mechanics, Postural dysfunction  Visit Diagnosis: Muscle weakness (generalized)  Other abnormalities of gait and mobility  Unsteadiness on feet     Problem List Patient Active Problem List   Diagnosis Date Noted   Focal seizure ([place]) 05/24/2020   Parkinson's disease ([place])    History of migraine headaches    Migraine syndrome    BPH (benign prostatic hyperplasia)    Depression    Degenerative spondylolisthesis 05/07/2016    Janna Arch, PT, DPT  10/07/2020,  12:22 PM  [place] MAIN [facility] SERVICES [address] [place], [place], [address] Phone: [phone]   Fax:  [phone]  Name: YANKY VANDERBURG MRN: [id number] Date of Birth: [birth year]

## 2020-10-09 ENCOUNTER — Other Ambulatory Visit: Payer: Self-pay

## 2020-10-09 ENCOUNTER — Ambulatory Visit: Payer: Medicare PPO

## 2020-10-09 DIAGNOSIS — M6281 Muscle weakness (generalized): Secondary | ICD-10-CM | POA: Diagnosis not present

## 2020-10-09 DIAGNOSIS — R2681 Unsteadiness on feet: Secondary | ICD-10-CM

## 2020-10-09 DIAGNOSIS — R2689 Other abnormalities of gait and mobility: Secondary | ICD-10-CM

## 2020-10-09 DIAGNOSIS — R278 Other lack of coordination: Secondary | ICD-10-CM

## 2020-10-09 NOTE — Therapy (Signed)
[place] MAIN [facility] SERVICES [address] [place], [place], [address] Phone: [phone]   Fax:  [phone]  Physical Therapy Treatment  Patient Details  Name: Cory [name] MRN: [id number] Date of Birth: Apr 07, 1955 Referring Provider (PT): Benay Spice   Encounter Date: 10/09/2020   PT End of Session - 10/09/20 1409     Visit Number 7    Number of Visits 24    Date for PT Re-Evaluation 12/11/20    Authorization Type 7/10 eval 6/16    PT Start Time 0800    PT Stop Time 0844    PT Time Calculation (min) 44 min    Equipment Utilized During Treatment Gait belt    Activity Tolerance Patient tolerated treatment well    Behavior During Therapy WFL for tasks assessed/performed             Past Medical History:  Diagnosis Date   Arthritis    hips and back   Asthma    as a baby-    GERD (gastroesophageal reflux disease)    rare -    Parkinson's disease ([place])    Pneumonia    age 65   Rash    legs- on going    Past Surgical History:  Procedure Laterality Date   APPENDECTOMY     BACK SURGERY     posterior lumbar spine fusion L4-5   COLONOSCOPY     COLONOSCOPY WITH PROPOFOL N/A 06/06/2017   Procedure: COLONOSCOPY WITH PROPOFOL;  Surgeon: Manya Silvas, MD;  Location[facility]COPY;  Service: Endoscopy;  Laterality: N/A;   KNEE ARTHROSCOPY Bilateral    ACL   KNEE ARTHROSCOPY W/ ACL RECONSTRUCTION     RADIAL KERATOTOMY     ROTATOR CUFF REPAIR Bilateral    SHOULDER ARTHROSCOPY WITH BICEPS TENDON REPAIR Left 04/16/2019   Procedure: SHOULDER ARTHROSCOPY WITH BICEPS TENDON REPAIR, MINI OPEN SUPERIOR CAPSULAR RECONSTRUCTION, BICEPS TENODESIS;  Surgeon: Leim Fabry, MD;  Location: [facility] ORS;  Service: Orthopedics;  Laterality: Left;    There were no vitals filed for this visit.   Subjective Assessment - 10/09/20 1408     Subjective Patient reports no falls or LOB since last session. Had a very good yesterday where he  didn't need to use his walker or cane at all.    Patient is accompained by: Family member    Pertinent History Patient admitted to hospital 05/23/20 with code stroke and R sided weakness. Imaging negative for acute changes. PMH includes Parkinson's with autonomic instability, asthma, GERD depression, degenerative spondylolisthesis, focal seizures and migraine syndrome. Patient had three ED visits for falls since then. Patient reports when he has the seizures he has pain in his temples and bright flashes in his vision. Last week had three falls in one day.    Limitations Lifting;Standing;Walking;House hold activities    How long can you sit comfortably? sleeps in easy chair.    How long can you stand comfortably? feel unsteady once in standing.    How long can you walk comfortably? difficult to walk during "off period"    Patient Stated Goals stability, have a device that will be most helpful, help to become and stay independent.    Currently in Pain? No/denies               Treat:   Neuro Re-ed Standing with CGA next to support surface:   Bosu ball: flat side up static stand 90  Bosu ball: flat side up:  modified squat 12x without UE support   Bosu ball: round side up: lateral leap 12x each LE cue for sequencing    Airex pad: dynadisc modified tandem stance  2x 30 seconds each LE placement   Seated: Theraband taps for coordination, sequencing spatial awareness and reaction timing 30 seconds x 2 trials Opposite UE/LE raises 10x each side.   Hallway:   red light green light for sudden termination and initiation ambulation around perimeter of gym (96 ft) x 2 trials; second trial with dual task of listing animals in alphabetical order for cognitive challenge  Seated posture bringing left shoulder down and tilting head back to neutral from the R sidebend 2x 30 seconds    TherEx: Step back into high knee flexion into GTB on second step 15x each LE, BUE support  Squat jumps from chair  10x  6" step toe taps without UE support 12x each LE 6" step eccentric heel taps 15x each LE with BUE support  Seated: Hip hike/drop to find midline 10x each side Cervical side bend for reduction of lateral resting position 10x  Pt educated throughout session about proper posture and technique with exercises. Improved exercise technique, movement at target joints, use of target muscles after min to mod verbal, visual, tactile cues    Access Code: [email or web address] URL: [email or web address] Date: 10/09/2020 Prepared by: Janna Arch  Exercises Neck Sidebending - 1 x daily - 7 x weekly - 2 sets - 2 reps - 30 hold Seated Weight Shifting Without Arm Support - 1 x daily - 7 x weekly - 2 sets - 10 reps - 5 hold    Patient is progressing with functional mobility and stability with excellent rate. His prognosis is good due to motivation and compliance. Patient is interested in starting a parkinson's support group. His ankle righting reactions are improving on stable and unstable surfaces. Posture continues to be area of improvement with hip shifts and cervical side bends added to HEP. Patient will benefit from skilled physical therapy to improve balance, gait mechanics, and overall functional mobility                 PT Education - 10/09/20 1409     Education Details exercise technique, body mechanics    Person(s) Educated Patient    Methods Explanation;Demonstration;Tactile cues;Verbal cues    Comprehension Verbalized understanding;Returned demonstration;Verbal cues required;Tactile cues required              PT Short Term Goals - 09/18/20 2057       PT SHORT TERM GOAL #1   Title Patient will be independent in home exercise program to improve strength/mobility for better functional independence with ADLs.    Baseline 6/16: HEP to be given next session    Time 4    Period Weeks    Status New    Target Date 10/16/20               PT Long Term Goals -  09/18/20 2057       PT LONG TERM GOAL #1   Title Patient will increase FOTO score to equal to or greater than 51%    to demonstrate statistically significant improvement in mobility and quality of life.    Baseline 6/16: 48%    Time 12    Period Weeks    Status New    Target Date 12/11/20      PT LONG TERM GOAL #2   Title Patient will increase Berg Balance score by >  6 points (34/56)  to demonstrate decreased fall risk during functional activities.    Baseline 6/16: 28/56    Time 12    Period Weeks    Status New    Target Date 12/11/20      PT LONG TERM GOAL #3   Title Patient will deny any falls over past 4 weeks to demonstrate improved safety awareness at home and work.    Baseline 6/16: multiple falls    Time 12    Period Weeks    Status New    Target Date 12/11/20      PT LONG TERM GOAL #4   Title Patient will increase six minute walk test distance to >1300 for progression to age norm community ambulator and improve gait ability    Baseline 6/16: 945 ft with multiple near falls    Time 12    Period Weeks    Status New    Target Date 12/11/20                   Plan - 10/09/20 1414     Clinical Impression Statement Patient is progressing with functional mobility and stability with excellent rate. His prognosis is good due to motivation and compliance. Patient is interested in starting a parkinson's support group. His ankle righting reactions are improving on stable and unstable surfaces. Posture continues to be area of improvement with hip shifts and cervical side bends added to HEP. Patient will benefit from skilled physical therapy to improve balance, gait mechanics, and overall functional mobility    Personal Factors and Comorbidities Age;Comorbidity 3+;Fitness;Past/Current Experience;Time since onset of injury/illness/exacerbation;Transportation    Comorbidities Parkinson's with autonomic instability, asthma, GERD depression, degenerative spondylolisthesis,  focal seizures and migraine syndrome    Examination-Activity Limitations Bathing;Bed Mobility;Bend;Caring for [employer];Locomotion Level;Lift;Hygiene/Grooming;Squat;Stairs;Stand;Toileting;Transfers    Examination-Participation Restrictions Church;Cleaning;Community Activity;Driving;Meal Prep;Medication Management;Laundry;Shop;Volunteer;Yard Work    Stability/Clinical Decision Making Unstable/Unpredictable    Rehab Potential Fair    PT Frequency 2x / week    PT Duration 12 weeks    PT Treatment/Interventions ADLs/Self Care Home Management;Biofeedback;Canalith Repostioning;Cryotherapy;Parrafin;Ultrasound;Traction;Moist Heat;Iontophoresis 4mg /ml Dexamethasone;Electrical Stimulation;DME Instruction;Gait training;Stair training;Functional mobility training;Neuromuscular re-education;Balance training;Therapeutic exercise;Therapeutic activities;Patient/family education;Orthotic Fit/Training;Manual techniques;Passive range of motion;Manual lymph drainage;Energy conservation;Splinting;Taping;Vasopneumatic Device;Vestibular;Joint Manipulations;Visual/perceptual remediation/compensation    PT Next Visit Plan walk with head turns    PT Home Exercise Plan to give next session    Consulted and Agree with Plan of Care Patient;Family member/caregiver    Family Member Consulted wife             Patient will benefit from skilled therapeutic intervention in order to improve the following deficits and impairments:  Abnormal gait, Decreased activity tolerance, Decreased balance, Decreased knowledge of precautions, Decreased endurance, Decreased coordination, Decreased cognition, Decreased knowledge of use of DME, Decreased mobility, Decreased safety awareness, Difficulty walking, Decreased strength, Impaired flexibility, Impaired perceived functional ability, Impaired sensation, Impaired tone, Impaired UE functional use, Impaired vision/preception, Improper body mechanics, Postural  dysfunction  Visit Diagnosis: Muscle weakness (generalized)  Other abnormalities of gait and mobility  Unsteadiness on feet  Other lack of coordination     Problem List Patient Active Problem List   Diagnosis Date Noted   Focal seizure ([place]) 05/24/2020   Parkinson's disease ([place])    History of migraine headaches    Migraine syndrome    BPH (benign prostatic hyperplasia)    Depression    Degenerative spondylolisthesis 05/07/2016   Janna Arch, PT, DPT  10/09/2020, 2:15 PM  [place]  [place], [place], [address] Phone: [phone]   Fax:  [phone]  Name: Cory [name] MRN: [id number] Date of Birth: 1955-10-22

## 2020-10-13 ENCOUNTER — Ambulatory Visit: Payer: Medicare PPO

## 2020-10-13 ENCOUNTER — Other Ambulatory Visit: Payer: Self-pay

## 2020-10-13 DIAGNOSIS — M6281 Muscle weakness (generalized): Secondary | ICD-10-CM

## 2020-10-13 DIAGNOSIS — R2681 Unsteadiness on feet: Secondary | ICD-10-CM

## 2020-10-13 DIAGNOSIS — R2689 Other abnormalities of gait and mobility: Secondary | ICD-10-CM

## 2020-10-13 DIAGNOSIS — R278 Other lack of coordination: Secondary | ICD-10-CM

## 2020-10-13 NOTE — Therapy (Signed)
[place] MAIN [facility] SERVICES [address] [place], [place], [address] Phone: [phone]   Fax:  [phone]  Physical Therapy Treatment  Patient Details  Name: Cory [name] MRN: [id number] Date of Birth: 1956-01-03 Referring Provider (PT): Benay Spice   Encounter Date: 10/13/2020   PT End of Session - 10/13/20 1012     Visit Number 8    Number of Visits 24    Date for PT Re-Evaluation 12/11/20    Authorization Type 8/10 eval 6/16    PT Start Time 1015    PT Stop Time 1059    PT Time Calculation (min) 44 min    Equipment Utilized During Treatment Gait belt    Activity Tolerance Patient tolerated treatment well    Behavior During Therapy WFL for tasks assessed/performed             Past Medical History:  Diagnosis Date   Arthritis    hips and back   Asthma    as a baby-    GERD (gastroesophageal reflux disease)    rare -    Parkinson's disease ([place])    Pneumonia    age 12   Rash    legs- on going    Past Surgical History:  Procedure Laterality Date   APPENDECTOMY     BACK SURGERY     posterior lumbar spine fusion L4-5   COLONOSCOPY     COLONOSCOPY WITH PROPOFOL N/A 06/06/2017   Procedure: COLONOSCOPY WITH PROPOFOL;  Surgeon: Manya Silvas, MD;  Location: [facility] ENDOSCOPY;  Service: Endoscopy;  Laterality: N/A;   KNEE ARTHROSCOPY Bilateral    ACL   KNEE ARTHROSCOPY W/ ACL RECONSTRUCTION     RADIAL KERATOTOMY     ROTATOR CUFF REPAIR Bilateral    SHOULDER ARTHROSCOPY WITH BICEPS TENDON REPAIR Left 04/16/2019   Procedure: SHOULDER ARTHROSCOPY WITH BICEPS TENDON REPAIR, MINI OPEN SUPERIOR CAPSULAR RECONSTRUCTION, BICEPS TENODESIS;  Surgeon: Leim Fabry, MD;  Location: [facility] ORS;  Service: Orthopedics;  Laterality: Left;    There were no vitals filed for this visit.   Subjective Assessment - 10/13/20 1017     Subjective Patient reports having a bad weekend but is feeling a little better today. Had multiple  near falls but none to the ground.    Patient is accompained by: Family member    Pertinent History Patient admitted to hospital 05/23/20 with code stroke and R sided weakness. Imaging negative for acute changes. PMH includes Parkinson's with autonomic instability, asthma, GERD depression, degenerative spondylolisthesis, focal seizures and migraine syndrome. Patient had three ED visits for falls since then. Patient reports when he has the seizures he has pain in his temples and bright flashes in his vision. Last week had three falls in one day.    Limitations Lifting;Standing;Walking;House hold activities    How long can you sit comfortably? sleeps in easy chair.    How long can you stand comfortably? feel unsteady once in standing.    How long can you walk comfortably? difficult to walk during "off period"    Patient Stated Goals stability, have a device that will be most helpful, help to become and stay independent.    Currently in Pain? No/denies                     Treat:   Neuro Re-ed Standing with CGA next to support surface: Step over orange hurdle with BLE; no UE support occasional circumduction of RLE 10x  each LE   Bosu ball: flat side up static stand 90  Bosu ball: flat side up: modified squat 12x without UE support    Airex pad: dynadisc modified tandem stance  2x 30 seconds each LE placement    TherEx: Squat jumps from chair 10x with focus on arm swing ; one near LOB  Squat into stand on toes, return to squat 10x ; BUE support on support bar SUE support lunge with high knee 10x each side; more challenging with LLE.    Ambulate 70 ft with focus on large steps and decreased shuffle.   In front of mirror: -marching with focus on L shoulder with head tilt cues x4 minutes  -opposite UE/LE raises with focus on head tilt x 2 minutes  Seated: RTB around bilateral feet: PVC pipe overhead: march with DF 10x each LE   RTB hamstring curl 12x each LE    Pt educated  throughout session about proper posture and technique with exercises. Improved exercise technique, movement at target joints, use of target muscles after min to mod verbal, visual, tactile cues   Patient is highly motivated throughout physical therapy session. Continued focus on postural correction with use of mirror with dual task performed. Patient requires external cueing for head positioning due to preference for tilt to R. Patient will benefit from skilled physical therapy to improve balance, gait mechanics, and overall functional mobility                 PT Education - 10/13/20 1012     Education Details exercise technique, body mechanics    Person(s) Educated Patient    Methods Explanation;Demonstration;Tactile cues;Verbal cues    Comprehension Verbalized understanding;Returned demonstration;Verbal cues required;Tactile cues required              PT Short Term Goals - 09/18/20 2057       PT SHORT TERM GOAL #1   Title Patient will be independent in home exercise program to improve strength/mobility for better functional independence with ADLs.    Baseline 6/16: HEP to be given next session    Time 4    Period Weeks    Status New    Target Date 10/16/20               PT Long Term Goals - 09/18/20 2057       PT LONG TERM GOAL #1   Title Patient will increase FOTO score to equal to or greater than 51%    to demonstrate statistically significant improvement in mobility and quality of life.    Baseline 6/16: 48%    Time 12    Period Weeks    Status New    Target Date 12/11/20      PT LONG TERM GOAL #2   Title Patient will increase Berg Balance score by > 6 points (34/56)  to demonstrate decreased fall risk during functional activities.    Baseline 6/16: 28/56    Time 12    Period Weeks    Status New    Target Date 12/11/20      PT LONG TERM GOAL #3   Title Patient will deny any falls over past 4 weeks to demonstrate improved safety awareness at  home and work.    Baseline 6/16: multiple falls    Time 12    Period Weeks    Status New    Target Date 12/11/20      PT LONG TERM GOAL #4   Title  Patient will increase six minute walk test distance to >1300 for progression to age norm community ambulator and improve gait ability    Baseline 6/16: 945 ft with multiple near falls    Time 12    Period Weeks    Status New    Target Date 12/11/20                   Plan - 10/13/20 1212     Clinical Impression Statement Patient is highly motivated throughout physical therapy session. Continued focus on postural correction with use of mirror with dual task performed. Patient requires external cueing for head positioning due to preference for tilt to R. Patient will benefit from skilled physical therapy to improve balance, gait mechanics, and overall functional mobility    Personal Factors and Comorbidities Age;Comorbidity 3+;Fitness;Past/Current Experience;Time since onset of injury/illness/exacerbation;Transportation    Comorbidities Parkinson's with autonomic instability, asthma, GERD depression, degenerative spondylolisthesis, focal seizures and migraine syndrome    Examination-Activity Limitations Bathing;Bed Mobility;Bend;Caring for [employer];Locomotion Level;Lift;Hygiene/Grooming;Squat;Stairs;Stand;Toileting;Transfers    Examination-Participation Restrictions Church;Cleaning;Community Activity;Driving;Meal Prep;Medication Management;Laundry;Shop;Volunteer;Yard Work    Stability/Clinical Decision Making Unstable/Unpredictable    Rehab Potential Fair    PT Frequency 2x / week    PT Duration 12 weeks    PT Treatment/Interventions ADLs/Self Care Home Management;Biofeedback;Canalith Repostioning;Cryotherapy;Parrafin;Ultrasound;Traction;Moist Heat;Iontophoresis 4mg /ml Dexamethasone;Electrical Stimulation;DME Instruction;Gait training;Stair training;Functional mobility training;Neuromuscular  re-education;Balance training;Therapeutic exercise;Therapeutic activities;Patient/family education;Orthotic Fit/Training;Manual techniques;Passive range of motion;Manual lymph drainage;Energy conservation;Splinting;Taping;Vasopneumatic Device;Vestibular;Joint Manipulations;Visual/perceptual remediation/compensation    PT Next Visit Plan walk with head turns    PT Home Exercise Plan to give next session    Consulted and Agree with Plan of Care Patient;Family member/caregiver    Family Member Consulted wife             Patient will benefit from skilled therapeutic intervention in order to improve the following deficits and impairments:  Abnormal gait, Decreased activity tolerance, Decreased balance, Decreased knowledge of precautions, Decreased endurance, Decreased coordination, Decreased cognition, Decreased knowledge of use of DME, Decreased mobility, Decreased safety awareness, Difficulty walking, Decreased strength, Impaired flexibility, Impaired perceived functional ability, Impaired sensation, Impaired tone, Impaired UE functional use, Impaired vision/preception, Improper body mechanics, Postural dysfunction  Visit Diagnosis: Muscle weakness (generalized)  Other abnormalities of gait and mobility  Unsteadiness on feet     Problem List Patient Active Problem List   Diagnosis Date Noted   Focal seizure ([place]) 05/24/2020   Parkinson's disease ([place])    History of migraine headaches    Migraine syndrome    BPH (benign prostatic hyperplasia)    Depression    Degenerative spondylolisthesis 05/07/2016   Janna Arch, PT, DPT  10/13/2020, 12:13 PM  [place] MAIN [facility] SERVICES [address] [place], [place], [address] Phone: [phone]   Fax:  [phone]  Name: Cory [name] MRN: [id number] Date of Birth: 02-20-1956

## 2020-10-13 NOTE — Therapy (Signed)
[place] MAIN [facility] SERVICES [address] [place], [place], [address] Phone: [phone]   Fax:  [phone]  Occupational Therapy Treatment  Patient Details  Name: Cory [name] MRN: [id number] Date of Birth: 02/28/1956 No data recorded  Encounter Date: 10/13/2020   OT End of Session - 10/13/20 1201     Visit Number 3    Number of Visits 24    Date for OT Re-Evaluation 12/22/20    OT Start Time 1100    OT Stop Time 1145    OT Time Calculation (min) 45 min    Equipment Utilized During Treatment 4 wheeled walker    Activity Tolerance Patient tolerated treatment well    Behavior During Therapy WFL for tasks assessed/performed             Past Medical History:  Diagnosis Date   Arthritis    hips and back   Asthma    as a baby-    GERD (gastroesophageal reflux disease)    rare -    Parkinson's disease ([place])    Pneumonia    age 42   Rash    legs- on going    Past Surgical History:  Procedure Laterality Date   APPENDECTOMY     BACK SURGERY     posterior lumbar spine fusion L4-5   COLONOSCOPY     COLONOSCOPY WITH PROPOFOL N/A 06/06/2017   Procedure: COLONOSCOPY WITH PROPOFOL;  Surgeon: Manya Silvas, MD;  Location: [facility] ENDOSCOPY;  Service: Endoscopy;  Laterality: N/A;   KNEE ARTHROSCOPY Bilateral    ACL   KNEE ARTHROSCOPY W/ ACL RECONSTRUCTION     RADIAL KERATOTOMY     ROTATOR CUFF REPAIR Bilateral    SHOULDER ARTHROSCOPY WITH BICEPS TENDON REPAIR Left 04/16/2019   Procedure: SHOULDER ARTHROSCOPY WITH BICEPS TENDON REPAIR, MINI OPEN SUPERIOR CAPSULAR RECONSTRUCTION, BICEPS TENODESIS;  Surgeon: Leim Fabry, MD;  Location: [facility] ORS;  Service: Orthopedics;  Laterality: Left;    There were no vitals filed for this visit.   Subjective Assessment - 10/13/20 1159     Subjective  "I've been doing pretty well with my belt loop, but having trouble with my buttons."    Patient is accompanied by: Family member    Pertinent  History Parkinson's Disease, R sided weakness, falls    Limitations balance, strength, coordination, visual perception, problem solving, processing information    Patient Stated Goals "I want to get stronger.  It frustrates me when I have to ask for help with things like cutting my food or buttoning a button."    Currently in Pain? Yes    Pain Score 2     Pain Location Shoulder    Pain Orientation Left    Pain Descriptors / Indicators Tightness    Pain Type Chronic pain    Pain Onset More than a month ago    Pain Frequency Intermittent            Occupational Therapy Treatment: Therapeutic Exercise: Pt participated in BUE strengthening and flexibility exercises, working to improve over head reach and UB ADLs using 1.5 lb dowel for bilat chest press/retraction, shoulder flexion, abduction, ER to top of head, and IR/extension behind back x2 sets 10 reps each.  Intermittent CGA to LUE to achieve end ROM and ensure good body mechanics.  Provided passive stretch for bilat shoulder retraction, shoulder flexion, and abd, with vc for neutral head and erect spine.    Self care management: Practiced threading belt  through pant loops with recommendation to use mirror for visual cue d/t decreased sensation to light touch in all fingertips.  Pt required slight extra time for looping belt behind the back, but vc for repositioning to utilize mirror.  Reinforced importance of having chair behind him at home or another back up in case of LOB.  Pt stated that he typically dresses with the bed behind him and his dresser in front of him to avoid a fall.  Advised pt on alternative belt options including magnetic clasp belt as pt had difficulty lining up belt prongs into holes when fastening belt.  Pt reported difficulty pulling items out of his pockets.  Practiced picking up 4 coins from the table and placing them in L front, R front, and both back pockets using either hand to scoop coins out of pocket with only  minor difficulty.  OT encouraged coin manipulation activities for improving [place] at home such as picking them up from the table 1 at time, keeping them in hand, and discarding one at a time, or stacking coins.  Instructed pt in use of button hook on a button up shirt, with pt requiring mod vc for sequencing and min A for technique.  Advised on options to obtain button hook but encouraged pt to wait to obtain until he knows whether additional practice would improve timing and ease of use.    Response to Treatment: Pt with BUE weakness and coordination deficits related to PD which limits independence with ADLs.  Pt was receptive to ADL strategies this day and will continue to benefit from additional skilled OT for HEP progression and instruction in additional ADL strategies in order to maximize indep with self care.     OT Education - 10/13/20 1201     Education Details dressing strategies, use of button hook    Person(s) Educated Patient;Spouse    Methods Explanation;Verbal cues;Demonstration    Comprehension Verbalized understanding;Returned demonstration              OT Short Term Goals - 09/30/20 1210       OT SHORT TERM GOAL #1   Title Pt will perform BUE HEP with min vc    Baseline Eval: not yet initiated    Time 6    Period Weeks    Status New    Target Date 11/10/20      OT SHORT TERM GOAL #2   Title Pt will report RPE of 4 or better to indicate improved tolerance to basic ADLs.    Baseline Eval: RPE of 6 with basic ADLs.    Time 6    Period Weeks    Status New    Target Date 11/10/20      OT SHORT TERM GOAL #3   Title Pt will utilize visual compensation strategies with min vc in order to improve basic and IADL performance.    Baseline Eval: pt reports some diplopia and decreased depth perception which limits ADL/IADL performance.    Time 6    Period Weeks    Status New    Target Date 11/10/20               OT Long Term Goals - 09/30/20 1138       OT LONG  TERM GOAL #1   Title Pt will increase bilat shoulder strength by 1MM grade in order to improve tolerance to UB ADLs/IADLs and reaching activities.    Baseline Eval: R/L shoulder flex/abd 4-/5, R/L shoulder ER  3+/5    Time 12    Period Weeks    Status New    Target Date 12/22/20      OT LONG TERM GOAL #2   Title Pt will manage clothing fasteners and looping belt with modified indep.    Baseline Eval: spouse assists.  Difficulty with tying shoes, looping belt, snapping and buttoning buttons.    Time 12    Period Weeks    Status New    Target Date 12/22/20      OT LONG TERM GOAL #3   Title Pt will cut food with modified indep.    Baseline Eval: spouse assists to cut food    Time 12    Period Weeks    Status New    Target Date 12/22/20      OT LONG TERM GOAL #4   Title Pt will brush hair with R dominant arm with modified indep.    Baseline Eval: Pt states he must hold R arm up with L hand, and often spouse must assist.    Time 12    Period Weeks    Status New    Target Date 12/22/20      OT LONG TERM GOAL #5   Title Pt will increase FOTO score to 62 or better to indicate measureable functional improvement with ADLs/IADLs.    Baseline Eval: FOTO score of 58    Time 12    Period Weeks    Status New    Target Date 12/22/20                   Plan - 10/13/20 1230     Clinical Impression Statement Pt with BUE weakness and coordination deficits related to PD which limits independence with ADLs.  Pt was receptive to ADL strategies this day and will continue to benefit from additional skilled OT for HEP progression and instruction in additional ADL strategies in order to maximize indep with self care.    OT Occupational Profile and History Detailed Assessment- Review of Records and additional review of physical, cognitive, psychosocial history related to current functional performance    Occupational performance deficits (Please refer to evaluation for details):  ADL's;IADL's;Leisure    Body Structure / Function / Physical Skills ADL;Coordination;Endurance;GMC;UE functional use;Balance;Sensation;IADL;Vision;Dexterity;FMC;Strength;Continence;Gait;Mobility    Rehab Potential Good    Clinical Decision Making Several treatment options, min-mod task modification necessary    Comorbidities Affecting Occupational Performance: May have comorbidities impacting occupational performance    Modification or Assistance to Complete Evaluation  Min-Moderate modification of tasks or assist with assess necessary to complete eval    OT Frequency 2x / week    OT Duration 12 weeks    OT Treatment/Interventions Self-care/ADL training;Therapeutic exercise;DME and/or AE instruction;Balance training;Neuromuscular education;Manual Therapy;Visual/perceptual remediation/compensation;Therapeutic activities;Patient/family education    Consulted and Agree with Plan of Care Patient;Family member/caregiver             Patient will benefit from skilled therapeutic intervention in order to improve the following deficits and impairments:   Body Structure / Function / Physical Skills: ADL, Coordination, Endurance, GMC, UE functional use, Balance, Sensation, IADL, Vision, Dexterity, FMC, Strength, Continence, Gait, Mobility       Visit Diagnosis: Muscle weakness (generalized)  Other lack of coordination    Problem List Patient Active Problem List   Diagnosis Date Noted   Focal seizure ([place]) 05/24/2020   Parkinson's disease ([place])    History of migraine headaches    Migraine syndrome  BPH (benign prostatic hyperplasia)    Depression    Degenerative spondylolisthesis 05/07/2016   Leta Speller, MS, OTR/L  Darleene Cleaver 10/13/2020, 12:30 PM  [place] MAIN [facility] SERVICES [address] [place], [place], [address] Phone: [phone]   Fax:  [phone]  Name: Cory [name] MRN: [id number] Date of Birth: 11-27-1955

## 2020-10-14 ENCOUNTER — Ambulatory Visit: Payer: Medicare PPO

## 2020-10-16 ENCOUNTER — Ambulatory Visit: Payer: Medicare PPO

## 2020-10-16 ENCOUNTER — Other Ambulatory Visit: Payer: Self-pay

## 2020-10-16 DIAGNOSIS — R2681 Unsteadiness on feet: Secondary | ICD-10-CM

## 2020-10-16 DIAGNOSIS — M6281 Muscle weakness (generalized): Secondary | ICD-10-CM

## 2020-10-16 DIAGNOSIS — R278 Other lack of coordination: Secondary | ICD-10-CM

## 2020-10-16 DIAGNOSIS — R2689 Other abnormalities of gait and mobility: Secondary | ICD-10-CM

## 2020-10-16 NOTE — Therapy (Signed)
[place] MAIN [facility] SERVICES [address] [place], [place], [address] Phone: [phone]   Fax:  [phone]  Physical Therapy Treatment  Patient Details  Name: Cory [name] MRN: [id number] Date of Birth: December 04, 1955 Referring Provider (PT): Benay Spice   Encounter Date: 10/16/2020   PT End of Session - 10/16/20 0758     Visit Number 9    Number of Visits 24    Date for PT Re-Evaluation 12/11/20    Authorization Type 9/10 eval 6/16    PT Start Time 0800    PT Stop Time 0844    PT Time Calculation (min) 44 min    Equipment Utilized During Treatment Gait belt    Activity Tolerance Patient tolerated treatment well    Behavior During Therapy WFL for tasks assessed/performed             Past Medical History:  Diagnosis Date   Arthritis    hips and back   Asthma    as a baby-    GERD (gastroesophageal reflux disease)    rare -    Parkinson's disease ([place])    Pneumonia    age 65   Rash    legs- on going    Past Surgical History:  Procedure Laterality Date   APPENDECTOMY     BACK SURGERY     posterior lumbar spine fusion L4-5   COLONOSCOPY     COLONOSCOPY WITH PROPOFOL N/A 06/06/2017   Procedure: COLONOSCOPY WITH PROPOFOL;  Surgeon: Manya Silvas, MD;  Location: [facility] ENDOSCOPY;  Service: Endoscopy;  Laterality: N/A;   KNEE ARTHROSCOPY Bilateral    ACL   KNEE ARTHROSCOPY W/ ACL RECONSTRUCTION     RADIAL KERATOTOMY     ROTATOR CUFF REPAIR Bilateral    SHOULDER ARTHROSCOPY WITH BICEPS TENDON REPAIR Left 04/16/2019   Procedure: SHOULDER ARTHROSCOPY WITH BICEPS TENDON REPAIR, MINI OPEN SUPERIOR CAPSULAR RECONSTRUCTION, BICEPS TENODESIS;  Surgeon: Leim Fabry, MD;  Location: [facility] ORS;  Service: Orthopedics;  Laterality: Left;    There were no vitals filed for this visit.   Subjective Assessment - 10/16/20 0854     Subjective Patient had two rough days since last sessions. On Tuesday had burning sensation in  bilateral temporal lobe. No sensation today of this nature.    Patient is accompained by: Family member    Pertinent History Patient admitted to hospital 05/23/20 with code stroke and R sided weakness. Imaging negative for acute changes. PMH includes Parkinson's with autonomic instability, asthma, GERD depression, degenerative spondylolisthesis, focal seizures and migraine syndrome. Patient had three ED visits for falls since then. Patient reports when he has the seizures he has pain in his temples and bright flashes in his vision. Last week had three falls in one day.    Limitations Lifting;Standing;Walking;House hold activities    How long can you sit comfortably? sleeps in easy chair.    How long can you stand comfortably? feel unsteady once in standing.    How long can you walk comfortably? difficult to walk during "off period"    Patient Stated Goals stability, have a device that will be most helpful, help to become and stay independent.    Currently in Pain? Yes    Pain Score 2     Pain Location Shoulder    Pain Orientation Left    Pain Descriptors / Indicators Tightness    Pain Type Chronic pain  Patient had two rough days since last sessions. On Tuesday had burning sensation in bilateral temporal lobe. No sensation today of this nature.   BP midsession. 145/80  stand 117/73 standing 3 minutes: 128/78   Treat:   Neuro Re-ed Standing with CGA next to support surface: Airex pad: three cone taps with single finger tips 8x each LE; one near LOB   Airex pad: dynadisc modified tandem stance  4x 30 seconds each LE placement  In hallway: Rainbow ball straight arm raise 2x 86 ft; able to perform without LOB Rainbow ball toss with ambulation 2x 86 ft; no LOB occasional short steps  Rainbow ball bounce with ambulation 2x 86 ft; two loss of ball requiring assistance.  Initiation/termination of ambulation with focus on big arm swing 4x 86 ft; cue for large step to turn    Standing cross body punches with mits for coordination, spatial awareness, and stabilization against pertubation's 60 seconds x 2 trials  Seated  RTB alternating toe taps 30 seconds     TherEx: Squat jumps from chair 10x with focus on arm swing ; one near LOB SUE support lunge with high knee 12x each side; more challenging with LLE. High knee to Pt holding mit for coordination    Ambulate 70 ft with focus on large steps and decreased shuffle.    Pt educated throughout session about proper posture and technique with exercises. Improved exercise technique, movement at target joints, use of target muscles after min to mod verbal, visual, tactile cues    Patient is highly motivated throughout physical therapy session. Power interventions added with patient tolerating well. Patient's blood pressure assessed due to "lightheadedness" when first standing. Noted orthostatic drop which patient and patient's wife report is normal for him. Patient will benefit from skilled physical therapy to improve balance, gait mechanics, and overall functional mobility                     PT Education - 10/16/20 0757     Education Details exercise technique, body mechanics    Person(s) Educated Patient    Methods Explanation;Demonstration;Tactile cues;Verbal cues    Comprehension Verbalized understanding;Returned demonstration;Verbal cues required;Tactile cues required              PT Short Term Goals - 09/18/20 2057       PT SHORT TERM GOAL #1   Title Patient will be independent in home exercise program to improve strength/mobility for better functional independence with ADLs.    Baseline 6/16: HEP to be given next session    Time 4    Period Weeks    Status New    Target Date 10/16/20               PT Long Term Goals - 09/18/20 2057       PT LONG TERM GOAL #1   Title Patient will increase FOTO score to equal to or greater than 51%    to demonstrate statistically  significant improvement in mobility and quality of life.    Baseline 6/16: 48%    Time 12    Period Weeks    Status New    Target Date 12/11/20      PT LONG TERM GOAL #2   Title Patient will increase Berg Balance score by > 6 points (34/56)  to demonstrate decreased fall risk during functional activities.    Baseline 6/16: 28/56    Time 12    Period Weeks  Status New    Target Date 12/11/20      PT LONG TERM GOAL #3   Title Patient will deny any falls over past 4 weeks to demonstrate improved safety awareness at home and work.    Baseline 6/16: multiple falls    Time 12    Period Weeks    Status New    Target Date 12/11/20      PT LONG TERM GOAL #4   Title Patient will increase six minute walk test distance to >1300 for progression to age norm community ambulator and improve gait ability    Baseline 6/16: 945 ft with multiple near falls    Time 12    Period Weeks    Status New    Target Date 12/11/20                   Plan - 10/16/20 0855     Clinical Impression Statement Patient is highly motivated throughout physical therapy session. Power interventions added with patient tolerating well. Patient's blood pressure assessed due to "lightheadedness" when first standing. Noted orthostatic drop which patient and patient's wife report is normal for him. Patient will benefit from skilled physical therapy to improve balance, gait mechanics, and overall functional mobility    Personal Factors and Comorbidities Age;Comorbidity 3+;Fitness;Past/Current Experience;Time since onset of injury/illness/exacerbation;Transportation    Comorbidities Parkinson's with autonomic instability, asthma, GERD depression, degenerative spondylolisthesis, focal seizures and migraine syndrome    Examination-Activity Limitations Bathing;Bed Mobility;Bend;Caring for [employer];Locomotion Level;Lift;Hygiene/Grooming;Squat;Stairs;Stand;Toileting;Transfers     Examination-Participation Restrictions Church;Cleaning;Community Activity;Driving;Meal Prep;Medication Management;Laundry;Shop;Volunteer;Yard Work    Stability/Clinical Decision Making Unstable/Unpredictable    Rehab Potential Fair    PT Frequency 2x / week    PT Duration 12 weeks    PT Treatment/Interventions ADLs/Self Care Home Management;Biofeedback;Canalith Repostioning;Cryotherapy;Parrafin;Ultrasound;Traction;Moist Heat;Iontophoresis 4mg /ml Dexamethasone;Electrical Stimulation;DME Instruction;Gait training;Stair training;Functional mobility training;Neuromuscular re-education;Balance training;Therapeutic exercise;Therapeutic activities;Patient/family education;Orthotic Fit/Training;Manual techniques;Passive range of motion;Manual lymph drainage;Energy conservation;Splinting;Taping;Vasopneumatic Device;Vestibular;Joint Manipulations;Visual/perceptual remediation/compensation    PT Next Visit Plan walk with head turns    PT Home Exercise Plan to give next session    Consulted and Agree with Plan of Care Patient;Family member/caregiver    Family Member Consulted wife             Patient will benefit from skilled therapeutic intervention in order to improve the following deficits and impairments:  Abnormal gait, Decreased activity tolerance, Decreased balance, Decreased knowledge of precautions, Decreased endurance, Decreased coordination, Decreased cognition, Decreased knowledge of use of DME, Decreased mobility, Decreased safety awareness, Difficulty walking, Decreased strength, Impaired flexibility, Impaired perceived functional ability, Impaired sensation, Impaired tone, Impaired UE functional use, Impaired vision/preception, Improper body mechanics, Postural dysfunction  Visit Diagnosis: Muscle weakness (generalized)  Other abnormalities of gait and mobility  Unsteadiness on feet     Problem List Patient Active Problem List   Diagnosis Date Noted   Focal seizure ([place]) 05/24/2020    Parkinson's disease ([place])    History of migraine headaches    Migraine syndrome    BPH (benign prostatic hyperplasia)    Depression    Degenerative spondylolisthesis 05/07/2016    Janna Arch, PT, DPT  10/16/2020, 8:56 AM  [facility] [address] [place], [place], [address] Phone: [phone]   Fax:  [phone]  Name: JIDENNA FIGGS MRN: [id number] Date of Birth: 02-06-1956

## 2020-10-16 NOTE — Therapy (Signed)
[place] MAIN [facility] SERVICES [address] [place], [place], [address] Phone: [phone]   Fax:  [phone]  Occupational Therapy Treatment  Patient Details  Name: Cory [name] MRN: [id number] Date of Birth: 10-Jul-1955 No data recorded  Encounter Date: 10/16/2020   OT End of Session - 10/16/20 0944     Visit Number 4    Number of Visits 24    Date for OT Re-Evaluation 12/22/20    OT Start Time 0845    OT Stop Time 0932    OT Time Calculation (min) 47 min    Equipment Utilized During Treatment 4 wheeled walker    Activity Tolerance Patient tolerated treatment well    Behavior During Therapy WFL for tasks assessed/performed             Past Medical History:  Diagnosis Date   Arthritis    hips and back   Asthma    as a baby-    GERD (gastroesophageal reflux disease)    rare -    Parkinson's disease ([place])    Pneumonia    age 4   Rash    legs- on going    Past Surgical History:  Procedure Laterality Date   APPENDECTOMY     BACK SURGERY     posterior lumbar spine fusion L4-5   COLONOSCOPY     COLONOSCOPY WITH PROPOFOL N/A 06/06/2017   Procedure: COLONOSCOPY WITH PROPOFOL;  Surgeon: Manya Silvas, MD;  Location: [facility] ENDOSCOPY;  Service: Endoscopy;  Laterality: N/A;   KNEE ARTHROSCOPY Bilateral    ACL   KNEE ARTHROSCOPY W/ ACL RECONSTRUCTION     RADIAL KERATOTOMY     ROTATOR CUFF REPAIR Bilateral    SHOULDER ARTHROSCOPY WITH BICEPS TENDON REPAIR Left 04/16/2019   Procedure: SHOULDER ARTHROSCOPY WITH BICEPS TENDON REPAIR, MINI OPEN SUPERIOR CAPSULAR RECONSTRUCTION, BICEPS TENODESIS;  Surgeon: Leim Fabry, MD;  Location: [facility] ORS;  Service: Orthopedics;  Laterality: Left;    There were no vitals filed for this visit.   Subjective Assessment - 10/16/20 0942     Subjective  "I've had some off days the last few days.  Today has been better."    Patient is accompanied by: Family member    Pertinent History  Parkinson's Disease, R sided weakness, falls    Limitations balance, strength, coordination, visual perception, problem solving, processing information    Patient Stated Goals "I want to get stronger.  It frustrates me when I have to ask for help with things like cutting my food or buttoning a button."    Currently in Pain? Yes    Pain Score 2     Pain Location Shoulder    Pain Orientation Right    Pain Descriptors / Indicators Aching    Pain Type Chronic pain    Pain Onset More than a month ago    Pain Frequency Intermittent    Multiple Pain Sites No            Occupational Therapy Treatment: Therapeutic Exercise: Pt participated in placing magnetic washers over vertical dowels alternating between R/L hands, working on convergence exercises to minimize diplopia and improve depth perception.  Pt compensated initially pushing washer into side of dowel, and sliding it up to top to enable looping it over top d/t difficulty locating target which was the tip of the dowel.  Pt with min-moderate over shooting/undershooting target.  Challenged pt to bring washer directly to the top of the dowel before sliding  it down dowel, requiring multiple trials.  Performed passive bilat shoulder stretches for flexion, abd, and ER, and instructed pt in back and shoulder retraction stretch with rolled towel along length of spine with pt laying supine on mat.  Encouraged pt complete this stretch at home using pool noodle or rolled towels and stretch 3-5 min daily in this position, working to open up chest, shoulders, and back for more erect posture. Pt verbalized understanding.    Self Care: Instructed pt in strategies for donning/doffing long sleeve button up shirt.  Pt tends to get shirt stuck above head or has difficulty threading 1 arm through sleeve behind back.  Practiced sitting placing shirt in lap and hands in sleeves up to mid forearms, then standing and using large movements to bring 1 arm behind head  and sliding both arms through.  Able to complete 2 of 5 trials with cues only, 3 of 5 trials required min-mod A.  Attempted use of button hook again with vc for sequencing, but ultimately pt had difficulty coordinating both hands together for effective use.  Reinforced pt to prioritize ADLs, always attempting tasks as independently as possible on a good day, but ok to ask for caregiver assist to avoid increased frustration, and to prevent unnecessary physical and mental exhaustion from task difficulty as to save physical/mental strength for other tasks important to patient that may come later in the day.  Encouraged pt/spouse to look for loose shirts and magnetic button shirts (pt has 2 magnetic button shirts and is pleased with them).  Also advised pt to sit for cognitive portions of dressing tasks, such as when setting up shirt and placing arms in shirt sleeves correctly, as dual tasking in standing tends to be more challenging for pt.  Pt agreed.   Response to Treatment: Pt tolerated treatment well and was receptive to all dressing strategies practiced this day.  Pt was encouraged to practice strategies learned this day at home in order to develop new routines for maximizing indep. Pt is limited with UB dressing d/t bilat shoulder weakness and stiffness, GMC/FMC deficits related to PD, as well as being challenged with the cognitive piece with clothing orientation/set up/sequencing with donning a shirt.  Pt's visual deficits with depth perception and occasional diplopia also contribute to ADL decline.  Pt will continue to benefit from skilled OT for instruction in ADL strategies, maximizing BUE strength/ROM/coordination, and providing therapeutic activities to address visual deficits which impact ADLs.      OT Education - 10/16/20 0943     Education Details dressing strategies    Person(s) Educated Patient;Spouse    Methods Explanation;Verbal cues;Demonstration    Comprehension Verbalized  understanding;Returned demonstration              OT Short Term Goals - 09/30/20 1210       OT SHORT TERM GOAL #1   Title Pt will perform BUE HEP with min vc    Baseline Eval: not yet initiated    Time 6    Period Weeks    Status New    Target Date 11/10/20      OT SHORT TERM GOAL #2   Title Pt will report RPE of 4 or better to indicate improved tolerance to basic ADLs.    Baseline Eval: RPE of 6 with basic ADLs.    Time 6    Period Weeks    Status New    Target Date 11/10/20      OT SHORT TERM GOAL #  3   Title Pt will utilize visual compensation strategies with min vc in order to improve basic and IADL performance.    Baseline Eval: pt reports some diplopia and decreased depth perception which limits ADL/IADL performance.    Time 6    Period Weeks    Status New    Target Date 11/10/20               OT Long Term Goals - 09/30/20 1138       OT LONG TERM GOAL #1   Title Pt will increase bilat shoulder strength by 1MM grade in order to improve tolerance to UB ADLs/IADLs and reaching activities.    Baseline Eval: R/L shoulder flex/abd 4-/5, R/L shoulder ER 3+/5    Time 12    Period Weeks    Status New    Target Date 12/22/20      OT LONG TERM GOAL #2   Title Pt will manage clothing fasteners and looping belt with modified indep.    Baseline Eval: spouse assists.  Difficulty with tying shoes, looping belt, snapping and buttoning buttons.    Time 12    Period Weeks    Status New    Target Date 12/22/20      OT LONG TERM GOAL #3   Title Pt will cut food with modified indep.    Baseline Eval: spouse assists to cut food    Time 12    Period Weeks    Status New    Target Date 12/22/20      OT LONG TERM GOAL #4   Title Pt will brush hair with R dominant arm with modified indep.    Baseline Eval: Pt states he must hold R arm up with L hand, and often spouse must assist.    Time 12    Period Weeks    Status New    Target Date 12/22/20      OT LONG TERM  GOAL #5   Title Pt will increase FOTO score to 62 or better to indicate measureable functional improvement with ADLs/IADLs.    Baseline Eval: FOTO score of 58    Time 12    Period Weeks    Status New    Target Date 12/22/20               Plan - 10/16/20 1010     Clinical Impression Statement Pt tolerated treatment well and was receptive to all dressing strategies practiced this day.  Pt was encouraged to practice strategies learned this day at home in order to develop new routines for maximizing indep. Pt is limited with UB dressing d/t bilat shoulder weakness and stiffness, GMC/FMC deficits related to PD, as well as being challenged with the cognitive piece with clothing orientation/set up/sequencing with donning a shirt.  Pt's visual deficits with depth perception and occasional diplopia also contribute to ADL decline.  Pt will continue to benefit from skilled OT for instruction in ADL strategies, maximizing BUE strength/ROM/coordination, and providing therapeutic activities to address visual deficits which impact ADLs.    OT Occupational Profile and History Detailed Assessment- Review of Records and additional review of physical, cognitive, psychosocial history related to current functional performance    Occupational performance deficits (Please refer to evaluation for details): ADL's;IADL's;Leisure    Body Structure / Function / Physical Skills ADL;Coordination;Endurance;GMC;UE functional use;Balance;Sensation;IADL;Vision;Dexterity;FMC;Strength;Continence;Gait;Mobility    Rehab Potential Good    Clinical Decision Making Several treatment options, min-mod task modification necessary    Comorbidities  Affecting Occupational Performance: May have comorbidities impacting occupational performance    Modification or Assistance to Complete Evaluation  Min-Moderate modification of tasks or assist with assess necessary to complete eval    OT Frequency 2x / week    OT Duration 12 weeks    OT  Treatment/Interventions Self-care/ADL training;Therapeutic exercise;DME and/or AE instruction;Balance training;Neuromuscular education;Manual Therapy;Visual/perceptual remediation/compensation;Therapeutic activities;Patient/family education    Consulted and Agree with Plan of Care Patient;Family member/caregiver             Patient will benefit from skilled therapeutic intervention in order to improve the following deficits and impairments:   Body Structure / Function / Physical Skills: ADL, Coordination, Endurance, GMC, UE functional use, Balance, Sensation, IADL, Vision, Dexterity, FMC, Strength, Continence, Gait, Mobility       Visit Diagnosis: Muscle weakness (generalized)  Other lack of coordination    Problem List Patient Active Problem List   Diagnosis Date Noted   Focal seizure ([place]) 05/24/2020   Parkinson's disease ([place])    History of migraine headaches    Migraine syndrome    BPH (benign prostatic hyperplasia)    Depression    Degenerative spondylolisthesis 05/07/2016   Leta Speller, MS, OTR/L  Darleene Cleaver 10/16/2020, 10:10 AM  [facility] [address] [place], [place], [address] Phone: [phone]   Fax:  [phone]  Name: OLUWADARASIMI REDMON MRN: [id number] Date of Birth: 1955/07/25

## 2020-10-21 ENCOUNTER — Other Ambulatory Visit: Payer: Self-pay

## 2020-10-21 ENCOUNTER — Ambulatory Visit: Payer: Medicare PPO

## 2020-10-21 DIAGNOSIS — R278 Other lack of coordination: Secondary | ICD-10-CM

## 2020-10-21 DIAGNOSIS — M6281 Muscle weakness (generalized): Secondary | ICD-10-CM

## 2020-10-21 DIAGNOSIS — R2689 Other abnormalities of gait and mobility: Secondary | ICD-10-CM

## 2020-10-21 NOTE — Therapy (Signed)
[place] MAIN [facility] SERVICES [address] [place], [place], [address] Phone: [phone]   Fax:  [phone]  Physical Therapy Treatment Physical Therapy Progress Note   Dates of reporting period  09/18/20   to   10/21/20   Patient Details  Name: Cory [name] MRN: [id number] Date of Birth: Feb 21, 1956 Referring Provider (PT): Benay Spice   Encounter Date: 10/21/2020   PT End of Session - 10/21/20 0847     Visit Number 10    Number of Visits 24    Date for PT Re-Evaluation 12/11/20    Authorization Type 10/10 eval 6/16; next session 1/10 PN 7/19.    PT Start Time 0845    PT Stop Time 0929    PT Time Calculation (min) 44 min    Equipment Utilized During Treatment Gait belt    Activity Tolerance Patient tolerated treatment well    Behavior During Therapy WFL for tasks assessed/performed             Past Medical History:  Diagnosis Date   Arthritis    hips and back   Asthma    as a baby-    GERD (gastroesophageal reflux disease)    rare -    Parkinson's disease ([place])    Pneumonia    age 25   Rash    legs- on going    Past Surgical History:  Procedure Laterality Date   APPENDECTOMY     BACK SURGERY     posterior lumbar spine fusion L4-5   COLONOSCOPY     COLONOSCOPY WITH PROPOFOL N/A 06/06/2017   Procedure: COLONOSCOPY WITH PROPOFOL;  Surgeon: Manya Silvas, MD;  Location: [facility] ENDOSCOPY;  Service: Endoscopy;  Laterality: N/A;   KNEE ARTHROSCOPY Bilateral    ACL   KNEE ARTHROSCOPY W/ ACL RECONSTRUCTION     RADIAL KERATOTOMY     ROTATOR CUFF REPAIR Bilateral    SHOULDER ARTHROSCOPY WITH BICEPS TENDON REPAIR Left 04/16/2019   Procedure: SHOULDER ARTHROSCOPY WITH BICEPS TENDON REPAIR, MINI OPEN SUPERIOR CAPSULAR RECONSTRUCTION, BICEPS TENODESIS;  Surgeon: Leim Fabry, MD;  Location: [facility] ORS;  Service: Orthopedics;  Laterality: Left;    There were no vitals filed for this visit.   Subjective Assessment -  10/21/20 0906     Subjective Patient reports no falls. Has been compliant with HEP.    Patient is accompained by: Family member    Pertinent History Patient admitted to hospital 05/23/20 with code stroke and R sided weakness. Imaging negative for acute changes. PMH includes Parkinson's with autonomic instability, asthma, GERD depression, degenerative spondylolisthesis, focal seizures and migraine syndrome. Patient had three ED visits for falls since then. Patient reports when he has the seizures he has pain in his temples and bright flashes in his vision. Last week had three falls in one day.    Limitations Lifting;Standing;Walking;House hold activities    How long can you sit comfortably? sleeps in easy chair.    How long can you stand comfortably? feel unsteady once in standing.    How long can you walk comfortably? difficult to walk during "off period"    Patient Stated Goals stability, have a device that will be most helpful, help to become and stay independent.    Currently in Pain? Yes    Pain Score 2     Pain Location Shoulder    Pain Orientation Right    Pain Descriptors / Indicators Aching    Pain Type Chronic pain  Pain Onset More than a month ago    Pain Frequency Intermittent                [facility] PT Assessment - 10/21/20 0001       Standardized Balance Assessment   Standardized Balance Assessment Berg Balance Test      Berg Balance Test   Sit to Stand Able to stand  independently using hands    Standing Unsupported Able to stand 2 minutes with supervision    Sitting with Back Unsupported but Feet Supported on Floor or Stool Able to sit safely and securely 2 minutes    Stand to Sit Sits safely with minimal use of hands    Transfers Able to transfer safely, minor use of hands    Standing Unsupported with Eyes Closed Able to stand 10 seconds with supervision    Standing Unsupported with Feet Together Able to place feet together independently but unable to hold for 30  seconds    From Standing, Reach Forward with Outstretched Arm Can reach forward >12 cm safely (5")    From Standing Position, Pick up Object from Floor Able to pick up shoe safely and easily    From Standing Position, Turn to Look Behind Over each Shoulder Looks behind one side only/other side shows less weight shift    Turn 360 Degrees Able to turn 360 degrees safely one side only in 4 seconds or less    Standing Unsupported, Alternately Place Feet on Step/Stool Able to stand independently and complete 8 steps >20 seconds    Standing Unsupported, One Foot in Front Able to plae foot ahead of the other independently and hold 30 seconds    Standing on One Leg Able to lift leg independently and hold 5-10 seconds    Total Score 45      Functional Gait  Assessment   Gait assessed  Yes    Gait Level Surface Walks 20 ft in less than 5.5 sec, no assistive devices, good speed, no evidence for imbalance, normal gait pattern, deviates no more than 6 in outside of the 12 in walkway width.    Change in Gait Speed Able to change speed, demonstrates mild gait deviations, deviates 6-10 in outside of the 12 in walkway width, or no gait deviations, unable to achieve a major change in velocity, or uses a change in velocity, or uses an assistive device.    Gait with Horizontal Head Turns Performs head turns smoothly with slight change in gait velocity (eg, minor disruption to smooth gait path), deviates 6-10 in outside 12 in walkway width, or uses an assistive device.    Gait with Vertical Head Turns Performs task with slight change in gait velocity (eg, minor disruption to smooth gait path), deviates 6 - 10 in outside 12 in walkway width or uses assistive device    Gait and Pivot Turn Cannot turn safely, requires assistance to turn and stop.    Step Over Obstacle Is able to step over one shoe box (4.5 in total height) but must slow down and adjust steps to clear box safely. May require verbal cueing.    Gait with  Narrow Base of Support Ambulates 4-7 steps.    Gait with Eyes Closed Walks 20 ft, uses assistive device, slower speed, mild gait deviations, deviates 6-10 in outside 12 in walkway width. Ambulates 20 ft in less than 9 sec but greater than 7 sec.    Ambulating Backwards Walks 20 ft, slow speed, abnormal gait  pattern, evidence for imbalance, deviates 10-15 in outside 12 in walkway width.    Steps Alternating feet, must use rail.    Total Score 16                   Goals: FOTO: 57%  BERG: 45/ 56  Falls in 4 weeks: 0 falls in past months.  6 min walk test : 1495 ft with one near LOB   New goal: FGA:  16/30    Treatment: HEP performance and demonstrated:   Access Code: [email or web address] URL: [email or web address] Date: 10/21/2020 Prepared by: Janna Arch  Exercises Seated Hamstring Stretch - 1 x daily - 7 x weekly - 2 sets - 2 reps - 30 hold Seated Thoracic Lumbar Extension - 1 x daily - 7 x weekly - 2 sets - 10 reps - 5 hold Standing Hip Flexor Stretch - 1 x daily - 7 x weekly - 2 sets - 2 reps - 30 hold Standing Hamstring Stretch on Counter - 1 x daily - 7 x weekly - 2 sets - 2 reps - 30 hold Standing 'L' Stretch at Counter - 1 x daily - 7 x weekly - 2 sets - 2 reps - 30 hold     Patient's condition has the potential to improve in response to therapy. Maximum improvement is yet to be obtained. The anticipated improvement is attainable and reasonable in a generally predictable time.  Patient reports he is having more good days than bad. Wants to work on stretching as well as his balance.    Patient is demonstrating excellent progression towards goals meeting goals at this time. Will progress goals and send to physician due to excellent progression of POC. Will add stretching and tai chi to current POC as well as dynamic/functional gait mobility. Patient is highly motivated and has excellent prognosis. Patient will benefit from skilled physical therapy to improve  balance, gait mechanics, and overall functional mobility      PT Education - 10/21/20 0904     Education Details exercise technique, body mechanics    Person(s) Educated Patient    Methods Explanation;Tactile cues;Demonstration;Verbal cues    Comprehension Verbalized understanding;Verbal cues required;Returned demonstration;Tactile cues required              PT Short Term Goals - 10/21/20 0903       PT SHORT TERM GOAL #1   Title Patient will be independent in home exercise program to improve strength/mobility for better functional independence with ADLs.    Baseline 6/16: HEP to be given next session 7/19: HEP compliant    Time 4    Period Weeks    Status Achieved    Target Date 10/16/20               PT Long Term Goals - 10/21/20 1235       PT LONG TERM GOAL #1   Title Patient will increase FOTO score to equal to or greater than 51%    to demonstrate statistically significant improvement in mobility and quality of life.    Baseline 6/16: 48% 7/19: 57%    Time 12    Period Weeks    Status Achieved      PT LONG TERM GOAL #2   Title Patient will increase Berg Balance score by > 6 points (34/56)  to demonstrate decreased fall risk during functional activities.    Baseline 6/16: 28/56 7/19: 45/56    Time 12    Period Weeks  Status Achieved      PT LONG TERM GOAL #3   Title Patient will deny any falls over past 4 weeks to demonstrate improved safety awareness at home and work.    Baseline 6/16: multiple falls 7/19: no falls reported    Time 12    Period Weeks    Status Achieved      PT LONG TERM GOAL #4   Title Patient will increase six minute walk test distance to >1300 for progression to age norm community ambulator and improve gait ability    Baseline 6/16: 945 ft with multiple near falls 7/19: 1495 ft    Time 12    Period Weeks    Status Achieved      PT LONG TERM GOAL #5   Title Patient will increase Berg Balance score by > 6 points (51/56)  to  demonstrate decreased fall risk during functional activities.    Baseline 7/19: 45/56    Time 12    Period Weeks    Status New    Target Date 12/11/20      Additional Long Term Goals   Additional Long Term Goals Yes      PT LONG TERM GOAL #6   Title Patient will increase Functional Gait Assessment score to >20/30 as to reduce fall risk and improve dynamic gait safety with community ambulation.    Baseline 7/19: 16/30    Time 12    Period Weeks    Status New    Target Date 12/11/20                   Plan - 10/21/20 1235     Clinical Impression Statement Patient is demonstrating excellent progression towards goals meeting goals at this time. Will progress goals and send to physician due to excellent progression of POC. Will add stretching and tai chi to current POC as well as dynamic/functional gait mobility. Patient is highly motivated and has excellent prognosis. Patient's condition has the potential to improve in response to therapy. Maximum improvement is yet to be obtained. The anticipated improvement is attainable and reasonable in a generally predictable time. Patient will benefit from skilled physical therapy to improve balance, gait mechanics, and overall functional mobility    Personal Factors and Comorbidities Age;Comorbidity 3+;Fitness;Past/Current Experience;Time since onset of injury/illness/exacerbation;Transportation    Comorbidities Parkinson's with autonomic instability, asthma, GERD depression, degenerative spondylolisthesis, focal seizures and migraine syndrome    Examination-Activity Limitations Bathing;Bed Mobility;Bend;Caring for [employer];Locomotion Level;Lift;Hygiene/Grooming;Squat;Stairs;Stand;Toileting;Transfers    Examination-Participation Restrictions Church;Cleaning;Community Activity;Driving;Meal Prep;Medication Management;Laundry;Shop;Volunteer;Yard Work    Stability/Clinical Decision Making Unstable/Unpredictable    Rehab  Potential Fair    PT Frequency 2x / week    PT Duration 12 weeks    PT Treatment/Interventions ADLs/Self Care Home Management;Biofeedback;Canalith Repostioning;Cryotherapy;Parrafin;Ultrasound;Traction;Moist Heat;Iontophoresis 106m/ml Dexamethasone;Electrical Stimulation;DME Instruction;Gait training;Stair training;Functional mobility training;Neuromuscular re-education;Balance training;Therapeutic exercise;Therapeutic activities;Patient/family education;Orthotic Fit/Training;Manual techniques;Passive range of motion;Manual lymph drainage;Energy conservation;Splinting;Taping;Vasopneumatic Device;Vestibular;Joint Manipulations;Visual/perceptual remediation/compensation    PT Next Visit Plan tai chi    PT Home Exercise Plan to give next session    Consulted and Agree with Plan of Care Patient;Family member/caregiver    Family Member Consulted wife             Patient will benefit from skilled therapeutic intervention in order to improve the following deficits and impairments:  Abnormal gait, Decreased activity tolerance, Decreased balance, Decreased knowledge of precautions, Decreased endurance, Decreased coordination, Decreased cognition, Decreased knowledge of use of DME, Decreased mobility, Decreased safety awareness, Difficulty walking, Decreased  strength, Impaired flexibility, Impaired perceived functional ability, Impaired sensation, Impaired tone, Impaired UE functional use, Impaired vision/preception, Improper body mechanics, Postural dysfunction  Visit Diagnosis: Muscle weakness (generalized)  Other lack of coordination  Other abnormalities of gait and mobility     Problem List Patient Active Problem List   Diagnosis Date Noted   Focal seizure ([place]) 05/24/2020   Parkinson's disease ([place])    History of migraine headaches    Migraine syndrome    BPH (benign prostatic hyperplasia)    Depression    Degenerative spondylolisthesis 05/07/2016    Janna Arch, PT,  DPT  10/21/2020, 12:39 PM  [place] MAIN [facility] SERVICES [address] [place], [place], [address] Phone: [phone]   Fax:  [phone]  Name: Cory [name] MRN: [id number] Date of Birth: [birth year]

## 2020-10-21 NOTE — Therapy (Signed)
[place] MAIN [facility] SERVICES [address] [place], [place], [address] Phone: [phone]   Fax:  [phone]  Occupational Therapy Treatment  Patient Details  Name: Cory [name] MRN: [id number] Date of Birth: 1956-01-03 No data recorded  Encounter Date: 10/21/2020   OT End of Session - 10/21/20 1137     Visit Number 5    Number of Visits 24    Date for OT Re-Evaluation 12/22/20    OT Start Time 0930    OT Stop Time 1015    OT Time Calculation (min) 45 min    Equipment Utilized During Treatment 4 wheeled walker    Activity Tolerance Patient tolerated treatment well    Behavior During Therapy WFL for tasks assessed/performed             Past Medical History:  Diagnosis Date   Arthritis    hips and back   Asthma    as a baby-    GERD (gastroesophageal reflux disease)    rare -    Parkinson's disease ([place])    Pneumonia    age 85   Rash    legs- on going    Past Surgical History:  Procedure Laterality Date   APPENDECTOMY     BACK SURGERY     posterior lumbar spine fusion L4-5   COLONOSCOPY     COLONOSCOPY WITH PROPOFOL N/A 06/06/2017   Procedure: COLONOSCOPY WITH PROPOFOL;  Surgeon: Manya Silvas, MD;  Location: [facility] ENDOSCOPY;  Service: Endoscopy;  Laterality: N/A;   KNEE ARTHROSCOPY Bilateral    ACL   KNEE ARTHROSCOPY W/ ACL RECONSTRUCTION     RADIAL KERATOTOMY     ROTATOR CUFF REPAIR Bilateral    SHOULDER ARTHROSCOPY WITH BICEPS TENDON REPAIR Left 04/16/2019   Procedure: SHOULDER ARTHROSCOPY WITH BICEPS TENDON REPAIR, MINI OPEN SUPERIOR CAPSULAR RECONSTRUCTION, BICEPS TENODESIS;  Surgeon: Leim Fabry, MD;  Location: [facility] ORS;  Service: Orthopedics;  Laterality: Left;    There were no vitals filed for this visit.   Subjective Assessment - 10/21/20 1135     Subjective  "My hands feel wooley and stiff today."    Pertinent History Parkinson's Disease, R sided weakness, falls    Limitations balance, strength,  coordination, visual perception, problem solving, processing information    Patient Stated Goals "I want to get stronger.  It frustrates me when I have to ask for help with things like cutting my food or buttoning a button."    Currently in Pain? Yes    Pain Score 2     Pain Location Shoulder    Pain Orientation Right    Pain Descriptors / Indicators Aching    Pain Type Chronic pain    Pain Onset More than a month ago    Pain Frequency Intermittent    Multiple Pain Sites No            Occupational Therapy Treatment: Self Care: Pt participated in manipulation of clothing fasteners buttoning/unbuttoning large buttons, zipper manipulation, and looping laces through large holes.  Pt requires extra time to manage zipper on pants with OT encouraging small/clear zip tie for easier pinch.  Pt had the idea of using bread tie for more of a camouflage, using this as a larger loop to more easily pull up zipper.  Pt required mod vc for bilat coordination of hands to manage large buttons.    Therapeutic Exercise: Dexterity addressed picking up small pegs and placing into pegboard requiring extra time  for both R/L hands, often dropping a peg or struggling to hit target of peg hole.  Cued pt to use the alternate hand to stabilize pegboard to facilitate BUE coordination.  No difficulty removing pegs from peg board with fast speed and good accuracy with R/L hands.  Used 2 lb dowel for bilat shoulder strengthening, performing in standing to challenge balance on first set, second set in sitting.  Completed all shoulder planes x2 sets 10 each.  2 minor LOB in standing with OT providing CGA/min A to prevent a fall.    Response to Treatment: Pt tolerated session well with intermittent rest breaks and remains very motivated to progress towards goals.  OT provided written handout for dowel exercises noted above per pt request.  Encouraged pt to perform in sitting at home to reduce fall risk.  Pt continues to present  with BUE weakness and FMC deficits which impair ADL performance.  Pt will benefit from continued skilled Occupational Therapy to address deficits noted above in order to maximize indep with ADLs/IADLs//leisure.      OT Education - 10/21/20 1136     Education Details Dowel exercises for HEP    Person(s) Educated Patient    Methods Explanation;Verbal cues;Demonstration    Comprehension Verbalized understanding;Returned demonstration              OT Short Term Goals - 09/30/20 1210       OT SHORT TERM GOAL #1   Title Pt will perform BUE HEP with min vc    Baseline Eval: not yet initiated    Time 6    Period Weeks    Status New    Target Date 11/10/20      OT SHORT TERM GOAL #2   Title Pt will report RPE of 4 or better to indicate improved tolerance to basic ADLs.    Baseline Eval: RPE of 6 with basic ADLs.    Time 6    Period Weeks    Status New    Target Date 11/10/20      OT SHORT TERM GOAL #3   Title Pt will utilize visual compensation strategies with min vc in order to improve basic and IADL performance.    Baseline Eval: pt reports some diplopia and decreased depth perception which limits ADL/IADL performance.    Time 6    Period Weeks    Status New    Target Date 11/10/20               OT Long Term Goals - 09/30/20 1138       OT LONG TERM GOAL #1   Title Pt will increase bilat shoulder strength by 1MM grade in order to improve tolerance to UB ADLs/IADLs and reaching activities.    Baseline Eval: R/L shoulder flex/abd 4-/5, R/L shoulder ER 3+/5    Time 12    Period Weeks    Status New    Target Date 12/22/20      OT LONG TERM GOAL #2   Title Pt will manage clothing fasteners and looping belt with modified indep.    Baseline Eval: spouse assists.  Difficulty with tying shoes, looping belt, snapping and buttoning buttons.    Time 12    Period Weeks    Status New    Target Date 12/22/20      OT LONG TERM GOAL #3   Title Pt will cut food with  modified indep.    Baseline Eval: spouse assists to cut food  Time 12    Period Weeks    Status New    Target Date 12/22/20      OT LONG TERM GOAL #4   Title Pt will brush hair with R dominant arm with modified indep.    Baseline Eval: Pt states he must hold R arm up with L hand, and often spouse must assist.    Time 12    Period Weeks    Status New    Target Date 12/22/20      OT LONG TERM GOAL #5   Title Pt will increase FOTO score to 62 or better to indicate measureable functional improvement with ADLs/IADLs.    Baseline Eval: FOTO score of 58    Time 12    Period Weeks    Status New    Target Date 12/22/20             Patient will benefit from skilled therapeutic intervention in order to improve the following deficits and impairments:   Body Structure / Function / Physical Skills: ADL, Coordination, Endurance, GMC, UE functional use, Balance, Sensation, IADL, Vision, Dexterity, FMC, Strength, Continence, Gait, Mobility  Visit Diagnosis: Muscle weakness (generalized)  Other lack of coordination    Problem List Patient Active Problem List   Diagnosis Date Noted   Focal seizure ([place]) 05/24/2020   Parkinson's disease ([place])    History of migraine headaches    Migraine syndrome    BPH (benign prostatic hyperplasia)    Depression    Degenerative spondylolisthesis 05/07/2016   Leta Speller, MS, OTR/L  Darleene Cleaver 10/21/2020, 2:05 PM  [place] MAIN [facility] SERVICES [address] [place], [place], [address] Phone: [phone]   Fax:  [phone]  Name: Cory [name] MRN: [id number] Date of Birth: Jul 21, 1955

## 2020-10-23 ENCOUNTER — Other Ambulatory Visit: Payer: Self-pay

## 2020-10-23 ENCOUNTER — Ambulatory Visit: Payer: Medicare PPO

## 2020-10-23 DIAGNOSIS — R278 Other lack of coordination: Secondary | ICD-10-CM

## 2020-10-23 DIAGNOSIS — M6281 Muscle weakness (generalized): Secondary | ICD-10-CM

## 2020-10-23 DIAGNOSIS — R2689 Other abnormalities of gait and mobility: Secondary | ICD-10-CM

## 2020-10-23 DIAGNOSIS — R2681 Unsteadiness on feet: Secondary | ICD-10-CM

## 2020-10-23 NOTE — Therapy (Signed)
[place] MAIN [facility] SERVICES [address] [place], [place], [address] Phone: [phone]   Fax:  [phone]  Occupational Therapy Treatment  Patient Details  Name: Cory [name] MRN: [id number] Date of Birth: 29-Aug-1955 No data recorded  Encounter Date: 10/23/2020   OT End of Session - 10/23/20 1716     Visit Number 6    Number of Visits 24    Date for OT Re-Evaluation 12/22/20    OT Start Time 1115    OT Stop Time 1202    OT Time Calculation (min) 47 min    Equipment Utilized During Treatment 4 wheeled walker    Activity Tolerance Patient tolerated treatment well    Behavior During Therapy WFL for tasks assessed/performed             Past Medical History:  Diagnosis Date   Arthritis    hips and back   Asthma    as a baby-    GERD (gastroesophageal reflux disease)    rare -    Parkinson's disease ([place])    Pneumonia    age 89   Rash    legs- on going    Past Surgical History:  Procedure Laterality Date   APPENDECTOMY     BACK SURGERY     posterior lumbar spine fusion L4-5   COLONOSCOPY     COLONOSCOPY WITH PROPOFOL N/A 06/06/2017   Procedure: COLONOSCOPY WITH PROPOFOL;  Surgeon: Manya Silvas, MD;  Location: [facility] ENDOSCOPY;  Service: Endoscopy;  Laterality: N/A;   KNEE ARTHROSCOPY Bilateral    ACL   KNEE ARTHROSCOPY W/ ACL RECONSTRUCTION     RADIAL KERATOTOMY     ROTATOR CUFF REPAIR Bilateral    SHOULDER ARTHROSCOPY WITH BICEPS TENDON REPAIR Left 04/16/2019   Procedure: SHOULDER ARTHROSCOPY WITH BICEPS TENDON REPAIR, MINI OPEN SUPERIOR CAPSULAR RECONSTRUCTION, BICEPS TENODESIS;  Surgeon: Leim Fabry, MD;  Location: [facility] ORS;  Service: Orthopedics;  Laterality: Left;    There were no vitals filed for this visit.   Subjective Assessment - 10/23/20 1714     Subjective  "I need to work on my eating."    Patient is accompanied by: Family member    Pertinent History Parkinson's Disease, R sided weakness, falls     Limitations balance, strength, coordination, visual perception, problem solving, processing information    Patient Stated Goals "I want to get stronger.  It frustrates me when I have to ask for help with things like cutting my food or buttoning a button."    Currently in Pain? Yes    Pain Score 5     Pain Location Shoulder    Pain Orientation Right;Left    Pain Descriptors / Indicators Aching    Pain Type Chronic pain    Pain Onset More than a month ago    Pain Frequency Intermittent    Aggravating Factors  raising arms up to shoulder height and above; no pain at rest    Multiple Pain Sites No           Occupational Therapy Treatment: Therapeutic Exercise: Instructed pt in diplopia exercises including pen-to-nose convergence, jump convergence, and use of a dot card.  Instructed pt in grading each exercise, encouraged completion 3-4 x daily but only for 2-3 min at at time.  Instructed pt in techniques to rest eyes during and between exercises, including closing eyes, blinking repeatedly, or looking into the distance.  Educated on signs of visual fatigue and knowing when to  rest eyes to minimize diplopia.    Self Care: Focus on self feeding techniques this day, with education on various adaptive utensils to include a swivel fork/spoon, weighted utensils, and built up grip.  Encouraged WB through elbows and forearms during self feeding, addressed modified grip to ease utensil to mouth, and decreasing distance from plate to mouth via height adjustment on chair or positioning plate of food on a platform to reduce spilling by decreasing distance.   Response to Treatment: Pt reported good understanding and return demo of diplopia exercises but will continue to reinforce these strategies and review these exercises for good carry over.  Pt receptive to all self feeding strategies and plans to try a lower chair or position plate higher to decrease distance from food on plate to mouth.  Pt/spouse  also may order swivel silverware to try as pt reports his tremors often cause him to drop food.  Pt will continue to benefit from skilled OT for improving BUE strength/coordination and continue to provide instruction in modified ADL strategies and AE in order to maximize indep with ADLs.     OT Education - 10/23/20 1716     Education Details self feeding AE and positioning modifications at table top; diplopia exercises    Person(s) Educated Patient    Methods Explanation;Verbal cues;Demonstration    Comprehension Verbalized understanding;Returned demonstration              OT Short Term Goals - 09/30/20 1210       OT SHORT TERM GOAL #1   Title Pt will perform BUE HEP with min vc    Baseline Eval: not yet initiated    Time 6    Period Weeks    Status New    Target Date 11/10/20      OT SHORT TERM GOAL #2   Title Pt will report RPE of 4 or better to indicate improved tolerance to basic ADLs.    Baseline Eval: RPE of 6 with basic ADLs.    Time 6    Period Weeks    Status New    Target Date 11/10/20      OT SHORT TERM GOAL #3   Title Pt will utilize visual compensation strategies with min vc in order to improve basic and IADL performance.    Baseline Eval: pt reports some diplopia and decreased depth perception which limits ADL/IADL performance.    Time 6    Period Weeks    Status New    Target Date 11/10/20               OT Long Term Goals - 09/30/20 1138       OT LONG TERM GOAL #1   Title Pt will increase bilat shoulder strength by 1MM grade in order to improve tolerance to UB ADLs/IADLs and reaching activities.    Baseline Eval: R/L shoulder flex/abd 4-/5, R/L shoulder ER 3+/5    Time 12    Period Weeks    Status New    Target Date 12/22/20      OT LONG TERM GOAL #2   Title Pt will manage clothing fasteners and looping belt with modified indep.    Baseline Eval: spouse assists.  Difficulty with tying shoes, looping belt, snapping and buttoning buttons.     Time 12    Period Weeks    Status New    Target Date 12/22/20      OT LONG TERM GOAL #3   Title Pt will  cut food with modified indep.    Baseline Eval: spouse assists to cut food    Time 12    Period Weeks    Status New    Target Date 12/22/20      OT LONG TERM GOAL #4   Title Pt will brush hair with R dominant arm with modified indep.    Baseline Eval: Pt states he must hold R arm up with L hand, and often spouse must assist.    Time 12    Period Weeks    Status New    Target Date 12/22/20      OT LONG TERM GOAL #5   Title Pt will increase FOTO score to 62 or better to indicate measureable functional improvement with ADLs/IADLs.    Baseline Eval: FOTO score of 58    Time 12    Period Weeks    Status New    Target Date 12/22/20                   Plan - 10/23/20 1731     Clinical Impression Statement Pt reported good understanding and return demo of diplopia exercises but will continue to reinforce these strategies and review these exercises for good carry over.  Pt receptive to all self feeding strategies and plans to try a lower chair or position plate higher to decrease distance from food on plate to mouth.  Pt/spouse also may order swivel silverware to try as pt reports his tremors often cause him to drop food.  Pt will continue to benefit from skilled OT for improving BUE strength/coordination and continue to provide instruction in modified ADL strategies and AE in order to maximize indep with ADLs.    OT Occupational Profile and History Detailed Assessment- Review of Records and additional review of physical, cognitive, psychosocial history related to current functional performance    Occupational performance deficits (Please refer to evaluation for details): ADL's;IADL's;Leisure    Body Structure / Function / Physical Skills ADL;Coordination;Endurance;GMC;UE functional use;Balance;Sensation;IADL;Vision;Dexterity;FMC;Strength;Continence;Gait;Mobility    Rehab  Potential Good    Clinical Decision Making Several treatment options, min-mod task modification necessary    Comorbidities Affecting Occupational Performance: May have comorbidities impacting occupational performance    Modification or Assistance to Complete Evaluation  Min-Moderate modification of tasks or assist with assess necessary to complete eval    OT Frequency 2x / week    OT Duration 12 weeks    OT Treatment/Interventions Self-care/ADL training;Therapeutic exercise;DME and/or AE instruction;Balance training;Neuromuscular education;Manual Therapy;Visual/perceptual remediation/compensation;Therapeutic activities;Patient/family education    Consulted and Agree with Plan of Care Patient;Family member/caregiver             Patient will benefit from skilled therapeutic intervention in order to improve the following deficits and impairments:   Body Structure / Function / Physical Skills: ADL, Coordination, Endurance, GMC, UE functional use, Balance, Sensation, IADL, Vision, Dexterity, FMC, Strength, Continence, Gait, Mobility       Visit Diagnosis: Muscle weakness (generalized)  Other lack of coordination    Problem List Patient Active Problem List   Diagnosis Date Noted   Focal seizure ([place]) 05/24/2020   Parkinson's disease ([place])    History of migraine headaches    Migraine syndrome    BPH (benign prostatic hyperplasia)    Depression    Degenerative spondylolisthesis 05/07/2016   Leta Speller, MS, OTR/L  Darleene Cleaver 10/23/2020, 5:31 PM  [place] MAIN [facility] SERVICES [place], [place], [address] Phone:  [phone]   Fax:  [phone]  Name: Cory [name] MRN: [id number] Date of Birth: 1955/07/19

## 2020-10-23 NOTE — Therapy (Signed)
[place] MAIN [facility] SERVICES [address] [place], [place], [address] Phone: [phone]   Fax:  [phone]  Physical Therapy Treatment  Patient Details  Name: Cory [name] MRN: [id number] Date of Birth: [birth year] Referring Provider (PT): Benay Spice   Encounter Date: 10/23/2020   PT End of Session - 10/23/20 1028     Visit Number 11    Number of Visits 24    Date for PT Re-Evaluation 12/11/20    Authorization Type 1/10 PN 7/19.    PT Start Time 1030    PT Stop Time 1114    PT Time Calculation (min) 44 min    Equipment Utilized During Treatment Gait belt    Activity Tolerance Patient tolerated treatment well    Behavior During Therapy WFL for tasks assessed/performed             Past Medical History:  Diagnosis Date   Arthritis    hips and back   Asthma    as a baby-    GERD (gastroesophageal reflux disease)    rare -    Parkinson's disease ([place])    Pneumonia    age 70   Rash    legs- on going    Past Surgical History:  Procedure Laterality Date   APPENDECTOMY     BACK SURGERY     posterior lumbar spine fusion L4-5   COLONOSCOPY     COLONOSCOPY WITH PROPOFOL N/A 06/06/2017   Procedure: COLONOSCOPY WITH PROPOFOL;  Surgeon: Manya Silvas, MD;  Location: [facility] ENDOSCOPY;  Service: Endoscopy;  Laterality: N/A;   KNEE ARTHROSCOPY Bilateral    ACL   KNEE ARTHROSCOPY W/ ACL RECONSTRUCTION     RADIAL KERATOTOMY     ROTATOR CUFF REPAIR Bilateral    SHOULDER ARTHROSCOPY WITH BICEPS TENDON REPAIR Left 04/16/2019   Procedure: SHOULDER ARTHROSCOPY WITH BICEPS TENDON REPAIR, MINI OPEN SUPERIOR CAPSULAR RECONSTRUCTION, BICEPS TENODESIS;  Surgeon: Leim Fabry, MD;  Location: [facility] ORS;  Service: Orthopedics;  Laterality: Left;    There were no vitals filed for this visit.   Subjective Assessment - 10/23/20 1056     Subjective Patient reports he had a bad day yesterday. No falls but was having issues with his  balanc.e    Patient is accompained by: Family member    Pertinent History Patient admitted to hospital 05/23/20 with code stroke and R sided weakness. Imaging negative for acute changes. PMH includes Parkinson's with autonomic instability, asthma, GERD depression, degenerative spondylolisthesis, focal seizures and migraine syndrome. Patient had three ED visits for falls since then. Patient reports when he has the seizures he has pain in his temples and bright flashes in his vision. Last week had three falls in one day.    Limitations Lifting;Standing;Walking;House hold activities    How long can you sit comfortably? sleeps in easy chair.    How long can you stand comfortably? feel unsteady once in standing.    How long can you walk comfortably? difficult to walk during "off period"    Patient Stated Goals stability, have a device that will be most helpful, help to become and stay independent.    Currently in Pain? Yes    Pain Score 2     Pain Location Shoulder    Pain Orientation Right    Pain Descriptors / Indicators Aching    Pain Type Chronic pain    Pain Onset More than a month ago    Pain Frequency Intermittent  Treat:   Neuro Re-ed Ai chi/yoga Contemplating yoga pose 10x 10 second holds squat with arms together hold 10 seconds x 10 Endosing pose: squat with arm abduction adduction 10x  Gathering pose: lunge with alternating UE swings 10x each LE placement  Freeing pose: twist laterally with arms open 10x each side   Bosu ball:  Flat side up : static stand 60 seconds with cues for sequencing and body mechanics  Airex pad: soccer ball clockwise 10x, counterclockwise 10x UE support for coordination and body mechanics  Ambulation with large arm swings big steps with dual task of naming animals in alphabetical order 6x 86 ft, frequent re-orientation to arm swing required.    Standing soccer ball taps 15x each LE; very challenging.    TherEx: Squat jumps  from chair 10x with focus on arm swing ; one near LOB SUE support lunge with high knee 12x each side; more challenging with LLE. High knee to Pt holding mit for coordination plie squat 10x with hands on // bars, cue for sequencing very challenging Bilateral heel raise 15x with focus on slow controlled movements Ambulate 70 ft with focus on large steps and decreased shuffle.   Seated: GTB hamstring curl 15x each LE GTB adduction 15x each LE    Pt educated throughout session about proper posture and technique with exercises. Improved exercise technique, movement at target joints, use of target muscles after min to mod verbal, visual, tactile cues      Patient is highly motivated throughout physical therapy session. He is able to tolerate introduction to [place] per his request. Prolonged holds with slow movements are challenging for patient but he is able to perform with visual and verbal cueing. Stability and dual task interventions will continue to also be required with future sessions. Patient will benefit from skilled physical therapy to improve balance, gait mechanics, and overall functional mobility               PT Education - 10/23/20 1028     Education Details exercise technique, tai chi/yoga additions    Person(s) Educated Patient    Methods Explanation;Demonstration;Tactile cues;Verbal cues    Comprehension Returned demonstration;Verbal cues required;Verbalized understanding;Tactile cues required              PT Short Term Goals - 10/21/20 0903       PT SHORT TERM GOAL #1   Title Patient will be independent in home exercise program to improve strength/mobility for better functional independence with ADLs.    Baseline 6/16: HEP to be given next session 7/19: HEP compliant    Time 4    Period Weeks    Status Achieved    Target Date 10/16/20               PT Long Term Goals - 10/21/20 1235       PT LONG TERM GOAL #1   Title Patient will increase  FOTO score to equal to or greater than 51%    to demonstrate statistically significant improvement in mobility and quality of life.    Baseline 6/16: 48% 7/19: 57%    Time 12    Period Weeks    Status Achieved      PT LONG TERM GOAL #2   Title Patient will increase Berg Balance score by > 6 points (34/56)  to demonstrate decreased fall risk during functional activities.    Baseline 6/16: 28/56 7/19: 45/56    Time 12    Period Weeks  Status Achieved      PT LONG TERM GOAL #3   Title Patient will deny any falls over past 4 weeks to demonstrate improved safety awareness at home and work.    Baseline 6/16: multiple falls 7/19: no falls reported    Time 12    Period Weeks    Status Achieved      PT LONG TERM GOAL #4   Title Patient will increase six minute walk test distance to >1300 for progression to age norm community ambulator and improve gait ability    Baseline 6/16: 945 ft with multiple near falls 7/19: 1495 ft    Time 12    Period Weeks    Status Achieved      PT LONG TERM GOAL #5   Title Patient will increase Berg Balance score by > 6 points (51/56)  to demonstrate decreased fall risk during functional activities.    Baseline 7/19: 45/56    Time 12    Period Weeks    Status New    Target Date 12/11/20      Additional Long Term Goals   Additional Long Term Goals Yes      PT LONG TERM GOAL #6   Title Patient will increase Functional Gait Assessment score to >20/30 as to reduce fall risk and improve dynamic gait safety with community ambulation.    Baseline 7/19: 16/30    Time 12    Period Weeks    Status New    Target Date 12/11/20                   Plan - 10/23/20 1201     Clinical Impression Statement Patient is highly motivated throughout physical therapy session. He is able to tolerate introduction to [place] per his request. Prolonged holds with slow movements are challenging for patient but he is able to perform with visual and verbal cueing.  Stability and dual task interventions will continue to also be required with future sessions. Patient will benefit from skilled physical therapy to improve balance, gait mechanics, and overall functional mobility    Personal Factors and Comorbidities Age;Comorbidity 3+;Fitness;Past/Current Experience;Time since onset of injury/illness/exacerbation;Transportation    Comorbidities Parkinson's with autonomic instability, asthma, GERD depression, degenerative spondylolisthesis, focal seizures and migraine syndrome    Examination-Activity Limitations Bathing;Bed Mobility;Bend;Caring for [employer];Locomotion Level;Lift;Hygiene/Grooming;Squat;Stairs;Stand;Toileting;Transfers    Examination-Participation Restrictions Church;Cleaning;Community Activity;Driving;Meal Prep;Medication Management;Laundry;Shop;Volunteer;Yard Work    Stability/Clinical Decision Making Unstable/Unpredictable    Rehab Potential Fair    PT Frequency 2x / week    PT Duration 12 weeks    PT Treatment/Interventions ADLs/Self Care Home Management;Biofeedback;Canalith Repostioning;Cryotherapy;Parrafin;Ultrasound;Traction;Moist Heat;Iontophoresis 70m/ml Dexamethasone;Electrical Stimulation;DME Instruction;Gait training;Stair training;Functional mobility training;Neuromuscular re-education;Balance training;Therapeutic exercise;Therapeutic activities;Patient/family education;Orthotic Fit/Training;Manual techniques;Passive range of motion;Manual lymph drainage;Energy conservation;Splinting;Taping;Vasopneumatic Device;Vestibular;Joint Manipulations;Visual/perceptual remediation/compensation    PT Next Visit Plan tai chi, high level balance    PT Home Exercise Plan to give next session    Consulted and Agree with Plan of Care Patient;Family member/caregiver    Family Member Consulted wife             Patient will benefit from skilled therapeutic intervention in order to improve the following deficits and  impairments:  Abnormal gait, Decreased activity tolerance, Decreased balance, Decreased knowledge of precautions, Decreased endurance, Decreased coordination, Decreased cognition, Decreased knowledge of use of DME, Decreased mobility, Decreased safety awareness, Difficulty walking, Decreased strength, Impaired flexibility, Impaired perceived functional ability, Impaired sensation, Impaired tone, Impaired UE functional use, Impaired vision/preception, Improper body mechanics, Postural  dysfunction  Visit Diagnosis: Muscle weakness (generalized)  Other abnormalities of gait and mobility  Unsteadiness on feet     Problem List Patient Active Problem List   Diagnosis Date Noted   Focal seizure ([place]) 05/24/2020   Parkinson's disease ([place])    History of migraine headaches    Migraine syndrome    BPH (benign prostatic hyperplasia)    Depression    Degenerative spondylolisthesis 05/07/2016   Janna Arch, PT, DPT  10/23/2020, 12:02 PM  [place] MAIN [facility] SERVICES [address] [place], [place], [address] Phone: [phone]   Fax:  [phone]  Name: BRIAN ZEITLIN MRN: [id number] Date of Birth: 1956-03-18

## 2020-10-28 ENCOUNTER — Ambulatory Visit: Payer: Medicare PPO | Admitting: Physical Therapy

## 2020-10-28 ENCOUNTER — Encounter: Payer: Medicare PPO | Admitting: Occupational Therapy

## 2020-10-28 ENCOUNTER — Other Ambulatory Visit: Payer: Self-pay

## 2020-10-28 DIAGNOSIS — M6281 Muscle weakness (generalized): Secondary | ICD-10-CM | POA: Diagnosis not present

## 2020-10-28 DIAGNOSIS — R2689 Other abnormalities of gait and mobility: Secondary | ICD-10-CM

## 2020-10-28 DIAGNOSIS — R2681 Unsteadiness on feet: Secondary | ICD-10-CM

## 2020-10-28 DIAGNOSIS — R278 Other lack of coordination: Secondary | ICD-10-CM

## 2020-10-28 NOTE — Therapy (Signed)
[place] MAIN [facility] SERVICES [address] [place], [place], [address] Phone: [phone]   Fax:  [phone]  Physical Therapy Treatment  Patient Details  Name: Cory [name] MRN: [id number] Date of Birth: 1956-03-27 Referring Provider (PT): Benay Spice   Encounter Date: 10/28/2020    Past Medical History:  Diagnosis Date   Arthritis    hips and back   Asthma    as a baby-    GERD (gastroesophageal reflux disease)    rare -    Parkinson's disease ([place])    Pneumonia    age 52   Rash    legs- on going    Past Surgical History:  Procedure Laterality Date   APPENDECTOMY     BACK SURGERY     posterior lumbar spine fusion L4-5   COLONOSCOPY     COLONOSCOPY WITH PROPOFOL N/A 06/06/2017   Procedure: COLONOSCOPY WITH PROPOFOL;  Surgeon: Manya Silvas, MD;  Location: [facility] ENDOSCOPY;  Service: Endoscopy;  Laterality: N/A;   KNEE ARTHROSCOPY Bilateral    ACL   KNEE ARTHROSCOPY W/ ACL RECONSTRUCTION     RADIAL KERATOTOMY     ROTATOR CUFF REPAIR Bilateral    SHOULDER ARTHROSCOPY WITH BICEPS TENDON REPAIR Left 04/16/2019   Procedure: SHOULDER ARTHROSCOPY WITH BICEPS TENDON REPAIR, MINI OPEN SUPERIOR CAPSULAR RECONSTRUCTION, BICEPS TENODESIS;  Surgeon: Leim Fabry, MD;  Location: [facility] ORS;  Service: Orthopedics;  Laterality: Left;    There were no vitals filed for this visit.   Subjective Assessment - 10/28/20 1350     Subjective Denies falls. No pains. Has been completing HEP. No questions or concerns at this time.    Currently in Pain? No/denies                 Treatment:  Neuro Re-ed Ai chi/yoga Contemplating yoga pose: holds squat with arms together hold 10 seconds x 10; reports fatigue in quads  Endosing pose: squat with arm abduction adduction 10x Freeing pose: twist laterally with arms open 10x each side   Bosu ball: Flat side up : static stand 60 seconds x2 reps with cues for sequencing and body  mechanics   Standing soccer ball taps alternating x60 seconds; very challenging.  Soccer against wall - kicking ball and reacting to it's bounce, CGA at all times for safety. Performed for coordination, reaction and footwork.   TherEx: Squat jumps from chair 10x with focus on arm swing ;  SUE support lunge with high knee 12x each side; more challenging with LLE, focus on power with knee drive.  Plie squat 10x with hands on // bars, cue for sequencing Bilateral heel raise 15x with focus on slow controlled movements   HEP (stretching) provided and performed:  [facility] Chest stretch at doorway x60 seconds; Seated hamstring stretch x60 seconds each LE; Kneeling hip flexor stretch with UE support and airex pad under knee x60 seconds each LE;    Pt educated throughout session about proper posture and technique with exercises. Improved exercise technique, movement at target joints, use of target muscles after min to mod verbal, visual, tactile cues       Patient is highly motivated throughout physical therapy session. Tai chi/yoga was performed throughout session due to fatigue experienced in the quads with squat holds. Prolonged holds with slow movements continue to challenge pt balance. With increased depth of squat during tai chi, pt did exhibit one complete posterior LOB with MAX A of PT to stabilize and correct.  3 stretches were performed and provided as an HEP with handout. Pt appeared tight in all 3 muscle groups. Stability and dual task interventions will continue to be required in future sessions. Patient will benefit from skilled physical therapy to improve balance, gait mechanics, and overall functional mobility.          PT Short Term Goals - 10/21/20 0903       PT SHORT TERM GOAL #1   Title Patient will be independent in home exercise program to improve strength/mobility for better functional independence with ADLs.    Baseline 6/16: HEP to be given next session 7/19: HEP  compliant    Time 4    Period Weeks    Status Achieved    Target Date 10/16/20               PT Long Term Goals - 10/21/20 1235       PT LONG TERM GOAL #1   Title Patient will increase FOTO score to equal to or greater than 51%    to demonstrate statistically significant improvement in mobility and quality of life.    Baseline 6/16: 48% 7/19: 57%    Time 12    Period Weeks    Status Achieved      PT LONG TERM GOAL #2   Title Patient will increase Berg Balance score by > 6 points (34/56)  to demonstrate decreased fall risk during functional activities.    Baseline 6/16: 28/56 7/19: 45/56    Time 12    Period Weeks    Status Achieved      PT LONG TERM GOAL #3   Title Patient will deny any falls over past 4 weeks to demonstrate improved safety awareness at home and work.    Baseline 6/16: multiple falls 7/19: no falls reported    Time 12    Period Weeks    Status Achieved      PT LONG TERM GOAL #4   Title Patient will increase six minute walk test distance to >1300 for progression to age norm community ambulator and improve gait ability    Baseline 6/16: 945 ft with multiple near falls 7/19: 1495 ft    Time 12    Period Weeks    Status Achieved      PT LONG TERM GOAL #5   Title Patient will increase Berg Balance score by > 6 points (51/56)  to demonstrate decreased fall risk during functional activities.    Baseline 7/19: 45/56    Time 12    Period Weeks    Status New    Target Date 12/11/20      Additional Long Term Goals   Additional Long Term Goals Yes      PT LONG TERM GOAL #6   Title Patient will increase Functional Gait Assessment score to >20/30 as to reduce fall risk and improve dynamic gait safety with community ambulation.    Baseline 7/19: 16/30    Time 12    Period Weeks    Status New    Target Date 12/11/20                    Patient will benefit from skilled therapeutic intervention in order to improve the following deficits and  impairments:     Visit Diagnosis: Muscle weakness (generalized)  Other lack of coordination  Other abnormalities of gait and mobility  Unsteadiness on feet     Problem List Patient Active Problem List  Diagnosis Date Noted   Focal seizure ([place]) 05/24/2020   Parkinson's disease ([place])    History of migraine headaches    Migraine syndrome    BPH (benign prostatic hyperplasia)    Depression    Degenerative spondylolisthesis 05/07/2016   Patrina Levering PT, DPT  [facility] 10/28/2020, 3:25 PM  [place] MAIN [facility] SERVICES [address] [place], [place], [address] Phone: [phone]   Fax:  [phone]  Name: Cory [name] MRN: [id number] Date of Birth: 14-Dec-1955

## 2020-10-30 ENCOUNTER — Ambulatory Visit: Payer: Medicare PPO

## 2020-10-30 ENCOUNTER — Ambulatory Visit: Payer: Medicare PPO | Admitting: Occupational Therapy

## 2020-11-03 ENCOUNTER — Ambulatory Visit: Payer: Medicare PPO

## 2020-11-04 ENCOUNTER — Ambulatory Visit: Payer: Medicare PPO

## 2020-11-05 ENCOUNTER — Ambulatory Visit: Payer: Medicare PPO

## 2020-11-10 ENCOUNTER — Other Ambulatory Visit: Payer: Self-pay

## 2020-11-10 ENCOUNTER — Ambulatory Visit: Payer: Medicare PPO

## 2020-11-10 ENCOUNTER — Ambulatory Visit: Payer: Medicare PPO | Attending: Neurology

## 2020-11-10 DIAGNOSIS — M6281 Muscle weakness (generalized): Secondary | ICD-10-CM

## 2020-11-10 DIAGNOSIS — R2689 Other abnormalities of gait and mobility: Secondary | ICD-10-CM | POA: Insufficient documentation

## 2020-11-10 DIAGNOSIS — R278 Other lack of coordination: Secondary | ICD-10-CM | POA: Insufficient documentation

## 2020-11-10 DIAGNOSIS — R2681 Unsteadiness on feet: Secondary | ICD-10-CM | POA: Diagnosis present

## 2020-11-10 NOTE — Therapy (Signed)
[place] MAIN [facility] SERVICES [address] [place], [place], [address] Phone: [phone]   Fax:  [phone]  Physical Therapy Treatment  Patient Details  Name: Cory [name] MRN: [id number] Date of Birth: [birth year] Referring Provider (PT): Benay Spice   Encounter Date: 11/10/2020   PT End of Session - 11/10/20 1605     Visit Number 13    Number of Visits 24    Date for PT Re-Evaluation 12/11/20    Authorization Type 3/10 PN 7/19.    PT Start Time 1602    PT Stop Time 1645    PT Time Calculation (min) 43 min    Equipment Utilized During Treatment Gait belt    Activity Tolerance Patient tolerated treatment well    Behavior During Therapy WFL for tasks assessed/performed             Past Medical History:  Diagnosis Date   Arthritis    hips and back   Asthma    as a baby-    GERD (gastroesophageal reflux disease)    rare -    Parkinson's disease ([place])    Pneumonia    age 83   Rash    legs- on going    Past Surgical History:  Procedure Laterality Date   APPENDECTOMY     BACK SURGERY     posterior lumbar spine fusion L4-5   COLONOSCOPY     COLONOSCOPY WITH PROPOFOL N/A 06/06/2017   Procedure: COLONOSCOPY WITH PROPOFOL;  Surgeon: Manya Silvas, MD;  Location: [facility] ENDOSCOPY;  Service: Endoscopy;  Laterality: N/A;   KNEE ARTHROSCOPY Bilateral    ACL   KNEE ARTHROSCOPY W/ ACL RECONSTRUCTION     RADIAL KERATOTOMY     ROTATOR CUFF REPAIR Bilateral    SHOULDER ARTHROSCOPY WITH BICEPS TENDON REPAIR Left 04/16/2019   Procedure: SHOULDER ARTHROSCOPY WITH BICEPS TENDON REPAIR, MINI OPEN SUPERIOR CAPSULAR RECONSTRUCTION, BICEPS TENODESIS;  Surgeon: Leim Fabry, MD;  Location: [facility] ORS;  Service: Orthopedics;  Laterality: Left;    There were no vitals filed for this visit.   Subjective Assessment - 11/10/20 1604     Subjective Last week patient was having neuro issues and was unable to walk/talk. Changed his meds  and have been doing better since then. Patient has not had any falls since last session. Has not been seen in almost two weeks.    Patient is accompained by: Family member    Pertinent History Patient admitted to hospital 05/23/20 with code stroke and R sided weakness. Imaging negative for acute changes. PMH includes Parkinson's with autonomic instability, asthma, GERD depression, degenerative spondylolisthesis, focal seizures and migraine syndrome. Patient had three ED visits for falls since then. Patient reports when he has the seizures he has pain in his temples and bright flashes in his vision. Last week had three falls in one day.    Limitations Lifting;Standing;Walking;House hold activities    How long can you sit comfortably? sleeps in easy chair.    How long can you stand comfortably? feel unsteady once in standing.    How long can you walk comfortably? difficult to walk during "off period"    Patient Stated Goals stability, have a device that will be most helpful, help to become and stay independent.    Currently in Pain? No/denies              Last week patient was having neuro issues and was unable to walk/talk. Changed his meds and  have been doing better since then. Patient has not had any falls since last session. Has not been seen in almost two weeks.    Treat:   Neuro Re-ed Ai chi/yoga Contemplating yoga pose 10x 10 second holds squat with arms together hold 10 seconds x 10 Endosing pose: squat with arm abduction adduction 10x    Bosu ball: Flat side up : static stand 60 seconds with cues for sequencing and body mechanics Finger tip support flat side up modified squats 10x; close CGA    Airex pad: soccer ball clockwise 10x, counterclockwise 10x UE support for coordination and body mechanics   Ambulation with large arm swings big steps with dual task of naming animals in alphabetical order 8x 86 ft, frequent re-orientation to arm swing required.   Standing soccer ball  taps 15x each LE; very challenging.     TherEx: Nustep Lvl 5; 4 minutes for cardiovascular and musculoskeletal challenge  Squat jumps from chair 12x with focus on arm swing ; one near LOB SUE support lunge with high knee 12x each side; more challenging with LLE. High knee to Pt holding mit for coordination plie squat 10x with hands on // bars, cue for sequencing very challenging Bilateral heel raise 15x with focus on slow controlled movements   Resisted ambulation #13.5 ;  2x each direction forward, backwards, left, and right   Seated: GTB alternating ER/IR 10x each LE    Pt educated throughout session about proper posture and technique with exercises. Improved exercise technique, movement at target joints, use of target muscles after min to mod verbal, visual, tactile cues    Patient is challenged with dual task of arm swing with ambulation and secondary task of naming animals. He is highly motivated throughout session and demonstrates excellent progression despite recent neuro episode. Patient is challenged with maintaining wide BOS and requires frequent cueing for spacing of feet. Patient will benefit from skilled physical therapy to improve balance, gait mechanics, and overall functional mobility                    PT Education - 11/10/20 1605     Education Details exercise technique, body mechanics    Person(s) Educated Patient    Methods Explanation;Demonstration;Tactile cues;Verbal cues    Comprehension Verbalized understanding;Returned demonstration;Verbal cues required;Tactile cues required              PT Short Term Goals - 10/21/20 0903       PT SHORT TERM GOAL #1   Title Patient will be independent in home exercise program to improve strength/mobility for better functional independence with ADLs.    Baseline 6/16: HEP to be given next session 7/19: HEP compliant    Time 4    Period Weeks    Status Achieved    Target Date 10/16/20                PT Long Term Goals - 10/21/20 1235       PT LONG TERM GOAL #1   Title Patient will increase FOTO score to equal to or greater than 51%    to demonstrate statistically significant improvement in mobility and quality of life.    Baseline 6/16: 48% 7/19: 57%    Time 12    Period Weeks    Status Achieved      PT LONG TERM GOAL #2   Title Patient will increase Berg Balance score by > 6 points (34/56)  to demonstrate decreased fall risk  during functional activities.    Baseline 6/16: 28/56 7/19: 45/56    Time 12    Period Weeks    Status Achieved      PT LONG TERM GOAL #3   Title Patient will deny any falls over past 4 weeks to demonstrate improved safety awareness at home and work.    Baseline 6/16: multiple falls 7/19: no falls reported    Time 12    Period Weeks    Status Achieved      PT LONG TERM GOAL #4   Title Patient will increase six minute walk test distance to >1300 for progression to age norm community ambulator and improve gait ability    Baseline 6/16: 945 ft with multiple near falls 7/19: 1495 ft    Time 12    Period Weeks    Status Achieved      PT LONG TERM GOAL #5   Title Patient will increase Berg Balance score by > 6 points (51/56)  to demonstrate decreased fall risk during functional activities.    Baseline 7/19: 45/56    Time 12    Period Weeks    Status New    Target Date 12/11/20      Additional Long Term Goals   Additional Long Term Goals Yes      PT LONG TERM GOAL #6   Title Patient will increase Functional Gait Assessment score to >20/30 as to reduce fall risk and improve dynamic gait safety with community ambulation.    Baseline 7/19: 16/30    Time 12    Period Weeks    Status New    Target Date 12/11/20                   Plan - 11/10/20 1651     Clinical Impression Statement Patient is challenged with dual task of arm swing with ambulation and secondary task of naming animals. He is highly motivated throughout session and  demonstrates excellent progression despite recent neuro episode. Patient is challenged with maintaining wide BOS and requires frequent cueing for spacing of feet. Patient will benefit from skilled physical therapy to improve balance, gait mechanics, and overall functional mobility    Personal Factors and Comorbidities Age;Comorbidity 3+;Fitness;Past/Current Experience;Time since onset of injury/illness/exacerbation;Transportation    Comorbidities Parkinson's with autonomic instability, asthma, GERD depression, degenerative spondylolisthesis, focal seizures and migraine syndrome    Examination-Activity Limitations Bathing;Bed Mobility;Bend;Caring for [employer];Locomotion Level;Lift;Hygiene/Grooming;Squat;Stairs;Stand;Toileting;Transfers    Examination-Participation Restrictions Church;Cleaning;Community Activity;Driving;Meal Prep;Medication Management;Laundry;Shop;Volunteer;Yard Work    Stability/Clinical Decision Making Unstable/Unpredictable    Rehab Potential Fair    PT Frequency 2x / week    PT Duration 12 weeks    PT Treatment/Interventions ADLs/Self Care Home Management;Biofeedback;Canalith Repostioning;Cryotherapy;Parrafin;Ultrasound;Traction;Moist Heat;Iontophoresis 62m/ml Dexamethasone;Electrical Stimulation;DME Instruction;Gait training;Stair training;Functional mobility training;Neuromuscular re-education;Balance training;Therapeutic exercise;Therapeutic activities;Patient/family education;Orthotic Fit/Training;Manual techniques;Passive range of motion;Manual lymph drainage;Energy conservation;Splinting;Taping;Vasopneumatic Device;Vestibular;Joint Manipulations;Visual/perceptual remediation/compensation    PT Next Visit Plan tai chi, high level balance    PT Home Exercise Plan to give next session    Consulted and Agree with Plan of Care Patient;Family member/caregiver    Family Member Consulted wife             Patient will benefit from skilled therapeutic  intervention in order to improve the following deficits and impairments:  Abnormal gait, Decreased activity tolerance, Decreased balance, Decreased knowledge of precautions, Decreased endurance, Decreased coordination, Decreased cognition, Decreased knowledge of use of DME, Decreased mobility, Decreased safety awareness, Difficulty walking, Decreased strength, Impaired flexibility, Impaired perceived  functional ability, Impaired sensation, Impaired tone, Impaired UE functional use, Impaired vision/preception, Improper body mechanics, Postural dysfunction  Visit Diagnosis: Muscle weakness (generalized)  Other lack of coordination  Other abnormalities of gait and mobility     Problem List Patient Active Problem List   Diagnosis Date Noted   Focal seizure ([place]) 05/24/2020   Parkinson's disease ([place])    History of migraine headaches    Migraine syndrome    BPH (benign prostatic hyperplasia)    Depression    Degenerative spondylolisthesis 05/07/2016    Janna Arch, PT, DPT  11/10/2020, 4:52 PM  [place] MAIN [facility] SERVICES [address] [place], [place], [address] Phone: [phone]   Fax:  [phone]  Name: OBDULIO MASH MRN: [id number] Date of Birth: 28-Jan-1956

## 2020-11-10 NOTE — Therapy (Signed)
[place] MAIN [facility] SERVICES [address] [place], [place], [address] Phone: [phone]   Fax:  [phone]  Occupational Therapy Treatment  Patient Details  Name: Cory [name] MRN: [id number] Date of Birth: 11-28-1955 No data recorded  Encounter Date: 11/10/2020   OT End of Session - 11/10/20 1736     Visit Number 7    Number of Visits 24    Date for OT Re-Evaluation 12/22/20    OT Start Time 1515    OT Stop Time 9    OT Time Calculation (min) 46 min    Equipment Utilized During Treatment 4 wheeled walker    Activity Tolerance Patient tolerated treatment well    Behavior During Therapy WFL for tasks assessed/performed             Past Medical History:  Diagnosis Date   Arthritis    hips and back   Asthma    as a baby-    GERD (gastroesophageal reflux disease)    rare -    Parkinson's disease ([place])    Pneumonia    age 65   Rash    legs- on going    Past Surgical History:  Procedure Laterality Date   APPENDECTOMY     BACK SURGERY     posterior lumbar spine fusion L4-5   COLONOSCOPY     COLONOSCOPY WITH PROPOFOL N/A 06/06/2017   Procedure: COLONOSCOPY WITH PROPOFOL;  Surgeon: Manya Silvas, MD;  Location: [facility] ENDOSCOPY;  Service: Endoscopy;  Laterality: N/A;   KNEE ARTHROSCOPY Bilateral    ACL   KNEE ARTHROSCOPY W/ ACL RECONSTRUCTION     RADIAL KERATOTOMY     ROTATOR CUFF REPAIR Bilateral    SHOULDER ARTHROSCOPY WITH BICEPS TENDON REPAIR Left 04/16/2019   Procedure: SHOULDER ARTHROSCOPY WITH BICEPS TENDON REPAIR, MINI OPEN SUPERIOR CAPSULAR RECONSTRUCTION, BICEPS TENODESIS;  Surgeon: Leim Fabry, MD;  Location: [facility] ORS;  Service: Orthopedics;  Laterality: Left;    There were no vitals filed for this visit.   Subjective Assessment - 11/10/20 1734     Subjective  "I'm looping my belt better."    Pertinent History Parkinson's Disease, R sided weakness, falls    Limitations balance, strength,  coordination, visual perception, problem solving, processing information    Patient Stated Goals "I want to get stronger.  It frustrates me when I have to ask for help with things like cutting my food or buttoning a button."    Currently in Pain? Yes    Pain Score 5     Pain Location Shoulder    Pain Orientation Right;Left    Pain Descriptors / Indicators Aching;Tightness;Tender    Pain Type Acute pain    Pain Onset Today    Pain Frequency Constant    Aggravating Factors  raising arms up    Multiple Pain Sites No            Occupational Therapy Treatment: Self care: Pt reports he switched his angle with his fork which has helped to reduce spilling, demonstrating that he holds the fork to direct it straight into his mouth vs from a side angle.  Practiced looping belt through loops today standing in front of the mirror.  Pt demos improved ability to reach behind his back, requiring slight extra time to feel for back middle belt loop, and slight extra time to fasten in front of him d/t difficulty seeing these 2 areas.  Pt reports better lighting at home and he  is consistent to use his mirror which has been effective.  Practiced opening various pill containers and picking up pills from table.  Pt drops pills on occasion, or they slide on the table.  OT recommended dicem at home for use with opening containers or using for non skid surface when working with his pills.  Pt receptive.   Manual: Manual massage to upper traps, peri scapular, and around [facility] joint of both shoulders where pt was c/o pain this day.  Performed passive stretching for bilat shoulder retraction, depression, shoulder flex/abd/ER for decreasing pain and stiffness.   Therapeutic Exercise: Used 2lb dowel for chest press, shoulder retraction, and shoulder flexion x3 sets 10 reps each.  OT assisted L side to achieve full range d/t L shoulder weakness.  Rest breaks needed between sets.    Response to Treatment: Pt reported pain  in bilat shoulder started at 5/10 pain, reduced to 1/10 pain by end of session after manual massage and stretching.  Pt continues to make gains with ADL performance and will continue to benefit from skilled OT for maximizing BUE strength and coordination and increasing indep with compensatory strategies to maximize indep with ADLs.     OT Education - 11/10/20 1736     Education Details ADL strategies    Person(s) Educated Patient    Methods Explanation;Verbal cues;Demonstration    Comprehension Verbalized understanding;Returned demonstration              OT Short Term Goals - 09/30/20 1210       OT SHORT TERM GOAL #1   Title Pt will perform BUE HEP with min vc    Baseline Eval: not yet initiated    Time 6    Period Weeks    Status New    Target Date 11/10/20      OT SHORT TERM GOAL #2   Title Pt will report RPE of 4 or better to indicate improved tolerance to basic ADLs.    Baseline Eval: RPE of 6 with basic ADLs.    Time 6    Period Weeks    Status New    Target Date 11/10/20      OT SHORT TERM GOAL #3   Title Pt will utilize visual compensation strategies with min vc in order to improve basic and IADL performance.    Baseline Eval: pt reports some diplopia and decreased depth perception which limits ADL/IADL performance.    Time 6    Period Weeks    Status New    Target Date 11/10/20               OT Long Term Goals - 09/30/20 1138       OT LONG TERM GOAL #1   Title Pt will increase bilat shoulder strength by 1MM grade in order to improve tolerance to UB ADLs/IADLs and reaching activities.    Baseline Eval: R/L shoulder flex/abd 4-/5, R/L shoulder ER 3+/5    Time 12    Period Weeks    Status New    Target Date 12/22/20      OT LONG TERM GOAL #2   Title Pt will manage clothing fasteners and looping belt with modified indep.    Baseline Eval: spouse assists.  Difficulty with tying shoes, looping belt, snapping and buttoning buttons.    Time 12     Period Weeks    Status New    Target Date 12/22/20      OT LONG TERM GOAL #3  Title Pt will cut food with modified indep.    Baseline Eval: spouse assists to cut food    Time 12    Period Weeks    Status New    Target Date 12/22/20      OT LONG TERM GOAL #4   Title Pt will brush hair with R dominant arm with modified indep.    Baseline Eval: Pt states he must hold R arm up with L hand, and often spouse must assist.    Time 12    Period Weeks    Status New    Target Date 12/22/20      OT LONG TERM GOAL #5   Title Pt will increase FOTO score to 62 or better to indicate measureable functional improvement with ADLs/IADLs.    Baseline Eval: FOTO score of 58    Time 12    Period Weeks    Status New    Target Date 12/22/20                   Plan - 11/10/20 1751     Clinical Impression Statement Pt reported pain in bilat shoulder started at 5/10 pain, reduced to 1/10 pain by end of session after manual massage and stretching.  Pt continues to make gains with ADL performance and will continue to benefit from skilled OT for maximizing BUE strength and coordination and increasing indep with compensatory strategies to maximize indep with ADLs.    OT Occupational Profile and History Detailed Assessment- Review of Records and additional review of physical, cognitive, psychosocial history related to current functional performance    Occupational performance deficits (Please refer to evaluation for details): ADL's;IADL's;Leisure    Body Structure / Function / Physical Skills ADL;Coordination;Endurance;GMC;UE functional use;Balance;Sensation;IADL;Vision;Dexterity;FMC;Strength;Continence;Gait;Mobility    Rehab Potential Good    Clinical Decision Making Several treatment options, min-mod task modification necessary    Comorbidities Affecting Occupational Performance: May have comorbidities impacting occupational performance    Modification or Assistance to Complete Evaluation   Min-Moderate modification of tasks or assist with assess necessary to complete eval    OT Frequency 2x / week    OT Duration 12 weeks    OT Treatment/Interventions Self-care/ADL training;Therapeutic exercise;DME and/or AE instruction;Balance training;Neuromuscular education;Manual Therapy;Visual/perceptual remediation/compensation;Therapeutic activities;Patient/family education    Consulted and Agree with Plan of Care Patient             Patient will benefit from skilled therapeutic intervention in order to improve the following deficits and impairments:   Body Structure / Function / Physical Skills: ADL, Coordination, Endurance, GMC, UE functional use, Balance, Sensation, IADL, Vision, Dexterity, FMC, Strength, Continence, Gait, Mobility       Visit Diagnosis: Muscle weakness (generalized)  Other lack of coordination    Problem List Patient Active Problem List   Diagnosis Date Noted   Focal seizure ([place]) 05/24/2020   Parkinson's disease ([place])    History of migraine headaches    Migraine syndrome    BPH (benign prostatic hyperplasia)    Depression    Degenerative spondylolisthesis 05/07/2016   Leta Speller, MS, OTR/L  Darleene Cleaver 11/10/2020, 5:52 PM  [place] MAIN [facility] SERVICES [address] [place], [place], [address] Phone: [phone]   Fax:  [phone]  Name: MARCELLES BOROUGHS MRN: [id number] Date of Birth: 05-09-1955

## 2020-11-11 ENCOUNTER — Ambulatory Visit: Payer: Medicare PPO

## 2020-11-11 ENCOUNTER — Other Ambulatory Visit: Payer: Self-pay | Admitting: Student

## 2020-11-11 DIAGNOSIS — G20 Parkinson's disease: Secondary | ICD-10-CM

## 2020-11-12 ENCOUNTER — Ambulatory Visit: Payer: Medicare PPO

## 2020-11-12 ENCOUNTER — Other Ambulatory Visit: Payer: Self-pay

## 2020-11-12 DIAGNOSIS — R278 Other lack of coordination: Secondary | ICD-10-CM

## 2020-11-12 DIAGNOSIS — M6281 Muscle weakness (generalized): Secondary | ICD-10-CM

## 2020-11-12 DIAGNOSIS — R2681 Unsteadiness on feet: Secondary | ICD-10-CM

## 2020-11-12 DIAGNOSIS — R2689 Other abnormalities of gait and mobility: Secondary | ICD-10-CM

## 2020-11-12 NOTE — Therapy (Signed)
[place] MAIN [facility] SERVICES [address] [place], [place], [address] Phone: [phone]   Fax:  [phone]  Occupational Therapy Treatment  Patient Details  Name: Cory [name] MRN: [id number] Date of Birth: 01/27/1956 No data recorded  Encounter Date: 11/12/2020   OT End of Session - 11/12/20 1528     Visit Number 8    Number of Visits 24    Date for OT Re-Evaluation 12/22/20    OT Start Time 1430    OT Stop Time 1515    OT Time Calculation (min) 45 min    Equipment Utilized During Treatment 4 wheeled walker    Activity Tolerance Patient tolerated treatment well    Behavior During Therapy WFL for tasks assessed/performed             Past Medical History:  Diagnosis Date   Arthritis    hips and back   Asthma    as a baby-    GERD (gastroesophageal reflux disease)    rare -    Parkinson's disease ([place])    Pneumonia    age 65   Rash    legs- on going    Past Surgical History:  Procedure Laterality Date   APPENDECTOMY     BACK SURGERY     posterior lumbar spine fusion L4-5   COLONOSCOPY     COLONOSCOPY WITH PROPOFOL N/A 06/06/2017   Procedure: COLONOSCOPY WITH PROPOFOL;  Surgeon: Manya Silvas, MD;  Location: [facility] ENDOSCOPY;  Service: Endoscopy;  Laterality: N/A;   KNEE ARTHROSCOPY Bilateral    ACL   KNEE ARTHROSCOPY W/ ACL RECONSTRUCTION     RADIAL KERATOTOMY     ROTATOR CUFF REPAIR Bilateral    SHOULDER ARTHROSCOPY WITH BICEPS TENDON REPAIR Left 04/16/2019   Procedure: SHOULDER ARTHROSCOPY WITH BICEPS TENDON REPAIR, MINI OPEN SUPERIOR CAPSULAR RECONSTRUCTION, BICEPS TENODESIS;  Surgeon: Leim Fabry, MD;  Location: [facility] ORS;  Service: Orthopedics;  Laterality: Left;    There were no vitals filed for this visit.   Subjective Assessment - 11/12/20 1526     Subjective  "My energy level has been better since I started therapy again."    Pertinent History Parkinson's Disease, R sided weakness, falls     Limitations balance, strength, coordination, visual perception, problem solving, processing information    Patient Stated Goals "I want to get stronger.  It frustrates me when I have to ask for help with things like cutting my food or buttoning a button."    Currently in Pain? Yes    Pain Score 4     Pain Location Shoulder    Pain Orientation Right;Left    Pain Descriptors / Indicators Aching;Tightness    Pain Type Chronic pain    Pain Onset Today    Pain Frequency Intermittent    Aggravating Factors  raising arms up past shoulder level or lifting heavy objects    Multiple Pain Sites No           Occupational Therapy Treatment: Therapeutic Exercise: Pt reporting difficulty pulling items out of his shorts pocket d/t hand getting stuck if using full grasp.  Advised pt needed to use a pinch or scissor objects between digits for smooth transition of hand out of pocket.  Addressed pinch strength this visit with therapy resistant clothespins (all colors, ranging from light to strong resistance).  Placed and removed all clothespins onto vertical bar, 1 trial R hand, 1 trial L hand.  OT encouraged pt practice scissoring  fingers between theraputty at home for improving task noted above.  Performed bilat shoulder strengthening using 1.5 lb dowel for chest press, shoulder flex and ER to top of head x 3 sets 12 reps each.  Min A from OT to reach end ROM after 8 repetitions.  Pt has a goal to be able to comb his hair with his R dominant arm without using L hand to boost up R arm to head.  OT reviewed red theraband exercises with pt to improve this task, specifically R shoulder flex and ER with band and good return demo.  Encouraged completion 3-5 days a week, 2 sets 10 reps.  Pt verbalized understanding.   Response to Treatment: Pt reports overall his energy level continues to improve since he's started therapy again.  Pt reports that prior to therapy, he may have 2 "good days" in a row in reference to his  PD symptoms, but is now have 4 "good days" consecutively.  Pt reports shoulder stiffness and weakness continues to limit UB ADL performance.  Pt will continue to benefit from skilled OT to provide ADL training and therapeutic exercise in order to maximize indep, consistency, and efficiency with ADLs.     OT Education - 11/12/20 1528     Education Details Theraband exercises for shoulder flex/ER for task of combing hair    Person(s) Educated Patient    Methods Explanation;Verbal cues;Demonstration    Comprehension Verbalized understanding;Returned demonstration              OT Short Term Goals - 09/30/20 1210       OT SHORT TERM GOAL #1   Title Pt will perform BUE HEP with min vc    Baseline Eval: not yet initiated    Time 6    Period Weeks    Status New    Target Date 11/10/20      OT SHORT TERM GOAL #2   Title Pt will report RPE of 4 or better to indicate improved tolerance to basic ADLs.    Baseline Eval: RPE of 6 with basic ADLs.    Time 6    Period Weeks    Status New    Target Date 11/10/20      OT SHORT TERM GOAL #3   Title Pt will utilize visual compensation strategies with min vc in order to improve basic and IADL performance.    Baseline Eval: pt reports some diplopia and decreased depth perception which limits ADL/IADL performance.    Time 6    Period Weeks    Status New    Target Date 11/10/20               OT Long Term Goals - 09/30/20 1138       OT LONG TERM GOAL #1   Title Pt will increase bilat shoulder strength by 1MM grade in order to improve tolerance to UB ADLs/IADLs and reaching activities.    Baseline Eval: R/L shoulder flex/abd 4-/5, R/L shoulder ER 3+/5    Time 12    Period Weeks    Status New    Target Date 12/22/20      OT LONG TERM GOAL #2   Title Pt will manage clothing fasteners and looping belt with modified indep.    Baseline Eval: spouse assists.  Difficulty with tying shoes, looping belt, snapping and buttoning buttons.     Time 12    Period Weeks    Status New    Target Date 12/22/20  OT LONG TERM GOAL #3   Title Pt will cut food with modified indep.    Baseline Eval: spouse assists to cut food    Time 12    Period Weeks    Status New    Target Date 12/22/20      OT LONG TERM GOAL #4   Title Pt will brush hair with R dominant arm with modified indep.    Baseline Eval: Pt states he must hold R arm up with L hand, and often spouse must assist.    Time 12    Period Weeks    Status New    Target Date 12/22/20      OT LONG TERM GOAL #5   Title Pt will increase FOTO score to 62 or better to indicate measureable functional improvement with ADLs/IADLs.    Baseline Eval: FOTO score of 58    Time 12    Period Weeks    Status New    Target Date 12/22/20                   Plan - 11/12/20 1539     Clinical Impression Statement Pt reports overall his energy level continues to improve since he's started therapy again.  Pt reports that prior to therapy, he may have 2 "good days" in a row in reference to his PD symptoms, but is now have 4 "good days" consecutively.  Pt reports shoulder stiffness and weakness continues to limit UB ADL performance.  Pt will continue to benefit from skilled OT to provide ADL training and therapeutic exercise in order to maximize indep, consistency, and efficiency with ADLs.    OT Occupational Profile and History Detailed Assessment- Review of Records and additional review of physical, cognitive, psychosocial history related to current functional performance    Occupational performance deficits (Please refer to evaluation for details): ADL's;IADL's;Leisure    Body Structure / Function / Physical Skills ADL;Coordination;Endurance;GMC;UE functional use;Balance;Sensation;IADL;Vision;Dexterity;FMC;Strength;Continence;Gait;Mobility    Rehab Potential Good    Clinical Decision Making Several treatment options, min-mod task modification necessary    Comorbidities Affecting  Occupational Performance: May have comorbidities impacting occupational performance    Modification or Assistance to Complete Evaluation  Min-Moderate modification of tasks or assist with assess necessary to complete eval    OT Frequency 2x / week    OT Duration 12 weeks    OT Treatment/Interventions Self-care/ADL training;Therapeutic exercise;DME and/or AE instruction;Balance training;Neuromuscular education;Manual Therapy;Visual/perceptual remediation/compensation;Therapeutic activities;Patient/family education    Consulted and Agree with Plan of Care Patient             Patient will benefit from skilled therapeutic intervention in order to improve the following deficits and impairments:   Body Structure / Function / Physical Skills: ADL, Coordination, Endurance, GMC, UE functional use, Balance, Sensation, IADL, Vision, Dexterity, FMC, Strength, Continence, Gait, Mobility       Visit Diagnosis: Muscle weakness (generalized)  Other lack of coordination    Problem List Patient Active Problem List   Diagnosis Date Noted   Focal seizure ([place]) 05/24/2020   Parkinson's disease ([place])    History of migraine headaches    Migraine syndrome    BPH (benign prostatic hyperplasia)    Depression    Degenerative spondylolisthesis 05/07/2016   Leta Speller, MS, OTR/L  Darleene Cleaver 11/12/2020, 3:40 PM  [place] MAIN [facility] SERVICES [address] [place], [place], [address] Phone: [phone]   Fax:  [phone]  Name: Cory [name] MRN:  [id number] Date of Birth: [birth year]

## 2020-11-12 NOTE — Therapy (Signed)
[place] MAIN [facility] SERVICES [address] [place], [place], [address] Phone: [phone]   Fax:  [phone]  Physical Therapy Treatment  Patient Details  Name: Cory [name] MRN: [id number] Date of Birth: 1955-08-12 Referring Provider (PT): Benay Spice   Encounter Date: 11/12/2020   PT End of Session - 11/12/20 1522     Visit Number 14    Number of Visits 24    Date for PT Re-Evaluation 12/11/20    Authorization Type 4/10 PN 7/19.    PT Start Time 1516    PT Stop Time 1559    PT Time Calculation (min) 43 min    Equipment Utilized During Treatment Gait belt    Activity Tolerance Patient tolerated treatment well    Behavior During Therapy WFL for tasks assessed/performed             Past Medical History:  Diagnosis Date   Arthritis    hips and back   Asthma    as a baby-    GERD (gastroesophageal reflux disease)    rare -    Parkinson's disease ([place])    Pneumonia    age 27   Rash    legs- on going    Past Surgical History:  Procedure Laterality Date   APPENDECTOMY     BACK SURGERY     posterior lumbar spine fusion L4-5   COLONOSCOPY     COLONOSCOPY WITH PROPOFOL N/A 06/06/2017   Procedure: COLONOSCOPY WITH PROPOFOL;  Surgeon: Manya Silvas, MD;  Location: [facility] ENDOSCOPY;  Service: Endoscopy;  Laterality: N/A;   KNEE ARTHROSCOPY Bilateral    ACL   KNEE ARTHROSCOPY W/ ACL RECONSTRUCTION     RADIAL KERATOTOMY     ROTATOR CUFF REPAIR Bilateral    SHOULDER ARTHROSCOPY WITH BICEPS TENDON REPAIR Left 04/16/2019   Procedure: SHOULDER ARTHROSCOPY WITH BICEPS TENDON REPAIR, MINI OPEN SUPERIOR CAPSULAR RECONSTRUCTION, BICEPS TENODESIS;  Surgeon: Leim Fabry, MD;  Location: [facility] ORS;  Service: Orthopedics;  Laterality: Left;    There were no vitals filed for this visit.   Subjective Assessment - 11/12/20 1519     Subjective Patient reports having four good days. No falls or LOB since last session. Compliant with  HEP.    Patient is accompained by: Family member    Pertinent History Patient admitted to hospital 05/23/20 with code stroke and R sided weakness. Imaging negative for acute changes. PMH includes Parkinson's with autonomic instability, asthma, GERD depression, degenerative spondylolisthesis, focal seizures and migraine syndrome. Patient had three ED visits for falls since then. Patient reports when he has the seizures he has pain in his temples and bright flashes in his vision. Last week had three falls in one day.    Limitations Lifting;Standing;Walking;House hold activities    How long can you sit comfortably? sleeps in easy chair.    How long can you stand comfortably? feel unsteady once in standing.    How long can you walk comfortably? difficult to walk during "off period"    Patient Stated Goals stability, have a device that will be most helpful, help to become and stay independent.    Currently in Pain? Yes    Pain Score 5     Pain Location Shoulder    Pain Orientation Right;Left    Pain Descriptors / Indicators Aching    Pain Type Chronic pain    Pain Onset More than a month ago  Treat:   Neuro Re-ed Airex balance beam:  -lateral stepping 6x length of // bars occasional near LOB -static stand throw balls with PT for pertubation inside/outside BOS x 3 minutes   Step over theraband and clap 15x each LE ; occasional instability and cueing for larger step length    [place] 8x length of // bars; occasional tripping over feet   Trampoline mini jumps 40x; 2 sets cue for upright posture   Tilt board: Anterior/Posterior weight shift 12x each foot placement. Tilt board lateral: medial/lateral 15x each side   TherEx: Octane fitness 4 minutes for cardiovascular and musculoskeletal challenge ; lvl 5  Walking lunges 4x length of // bars; occasional LOB requiring cues for sequencing and upright positive.  6" step up/down 15x each LE no UE support  Squat jumps  from chair 12x with focus on arm swing ; one near LOB    Pt educated throughout session about proper posture and technique with exercises. Improved exercise technique, movement at target joints, use of target muscles after min to mod verbal, visual, tactile cues   Patient is highly motivated throughout physical therapy session. He is able to perform step negotiation without UE support this session with cueing requiring for alternating pattern sequencing. Higher level dual task interventions are challenging for patient but will continue to be an area of progression. He tolerates lunges well with fatigue reported at end of session. Patient will benefit from skilled physical therapy to improve balance, gait mechanics, and overall functional mobility                   PT Education - 11/12/20 1522     Education Details exercise technique, body mechanics    Person(s) Educated Patient    Methods Explanation;Demonstration;Tactile cues;Verbal cues    Comprehension Verbalized understanding;Returned demonstration;Verbal cues required;Tactile cues required              PT Short Term Goals - 10/21/20 0903       PT SHORT TERM GOAL #1   Title Patient will be independent in home exercise program to improve strength/mobility for better functional independence with ADLs.    Baseline 6/16: HEP to be given next session 7/19: HEP compliant    Time 4    Period Weeks    Status Achieved    Target Date 10/16/20               PT Long Term Goals - 10/21/20 1235       PT LONG TERM GOAL #1   Title Patient will increase FOTO score to equal to or greater than 51%    to demonstrate statistically significant improvement in mobility and quality of life.    Baseline 6/16: 48% 7/19: 57%    Time 12    Period Weeks    Status Achieved      PT LONG TERM GOAL #2   Title Patient will increase Berg Balance score by > 6 points (34/56)  to demonstrate decreased fall risk during functional  activities.    Baseline 6/16: 28/56 7/19: 45/56    Time 12    Period Weeks    Status Achieved      PT LONG TERM GOAL #3   Title Patient will deny any falls over past 4 weeks to demonstrate improved safety awareness at home and work.    Baseline 6/16: multiple falls 7/19: no falls reported    Time 12    Period Weeks    Status Achieved  PT LONG TERM GOAL #4   Title Patient will increase six minute walk test distance to >1300 for progression to age norm community ambulator and improve gait ability    Baseline 6/16: 945 ft with multiple near falls 7/19: 1495 ft    Time 12    Period Weeks    Status Achieved      PT LONG TERM GOAL #5   Title Patient will increase Berg Balance score by > 6 points (51/56)  to demonstrate decreased fall risk during functional activities.    Baseline 7/19: 45/56    Time 12    Period Weeks    Status New    Target Date 12/11/20      Additional Long Term Goals   Additional Long Term Goals Yes      PT LONG TERM GOAL #6   Title Patient will increase Functional Gait Assessment score to >20/30 as to reduce fall risk and improve dynamic gait safety with community ambulation.    Baseline 7/19: 16/30    Time 12    Period Weeks    Status New    Target Date 12/11/20                   Plan - 11/12/20 1710     Clinical Impression Statement Patient is highly motivated throughout physical therapy session. He is able to perform step negotiation without UE support this session with cueing requiring for alternating pattern sequencing. Higher level dual task interventions are challenging for patient but will continue to be an area of progression. He tolerates lunges well with fatigue reported at end of session. Patient will benefit from skilled physical therapy to improve balance, gait mechanics, and overall functional mobility    Personal Factors and Comorbidities Age;Comorbidity 3+;Fitness;Past/Current Experience;Time since onset of  injury/illness/exacerbation;Transportation    Comorbidities Parkinson's with autonomic instability, asthma, GERD depression, degenerative spondylolisthesis, focal seizures and migraine syndrome    Examination-Activity Limitations Bathing;Bed Mobility;Bend;Caring for [employer];Locomotion Level;Lift;Hygiene/Grooming;Squat;Stairs;Stand;Toileting;Transfers    Examination-Participation Restrictions Church;Cleaning;Community Activity;Driving;Meal Prep;Medication Management;Laundry;Shop;Volunteer;Yard Work    Stability/Clinical Decision Making Unstable/Unpredictable    Rehab Potential Fair    PT Frequency 2x / week    PT Duration 12 weeks    PT Treatment/Interventions ADLs/Self Care Home Management;Biofeedback;Canalith Repostioning;Cryotherapy;Parrafin;Ultrasound;Traction;Moist Heat;Iontophoresis 72m/ml Dexamethasone;Electrical Stimulation;DME Instruction;Gait training;Stair training;Functional mobility training;Neuromuscular re-education;Balance training;Therapeutic exercise;Therapeutic activities;Patient/family education;Orthotic Fit/Training;Manual techniques;Passive range of motion;Manual lymph drainage;Energy conservation;Splinting;Taping;Vasopneumatic Device;Vestibular;Joint Manipulations;Visual/perceptual remediation/compensation    PT Next Visit Plan tai chi, high level balance    PT Home Exercise Plan to give next session    Consulted and Agree with Plan of Care Patient;Family member/caregiver    Family Member Consulted wife             Patient will benefit from skilled therapeutic intervention in order to improve the following deficits and impairments:  Abnormal gait, Decreased activity tolerance, Decreased balance, Decreased knowledge of precautions, Decreased endurance, Decreased coordination, Decreased cognition, Decreased knowledge of use of DME, Decreased mobility, Decreased safety awareness, Difficulty walking, Decreased strength, Impaired flexibility, Impaired  perceived functional ability, Impaired sensation, Impaired tone, Impaired UE functional use, Impaired vision/preception, Improper body mechanics, Postural dysfunction  Visit Diagnosis: Muscle weakness (generalized)  Other abnormalities of gait and mobility  Unsteadiness on feet     Problem List Patient Active Problem List   Diagnosis Date Noted   Focal seizure ([place] 05/24/2020   Parkinson's disease ([place]    History of migraine headaches    Migraine syndrome    BPH (benign prostatic  hyperplasia)    Depression    Degenerative spondylolisthesis 05/07/2016    Janna Arch, PT, DPT  11/12/2020, 5:11 PM  [place] MAIN [facility] SERVICES [address] [place], [place], [address] Phone: [phone]   Fax:  [phone]  Name: Cory [name] MRN: [id number] Date of Birth: [birth year]

## 2020-11-17 ENCOUNTER — Other Ambulatory Visit: Payer: Self-pay

## 2020-11-17 ENCOUNTER — Ambulatory Visit: Payer: Medicare PPO

## 2020-11-17 DIAGNOSIS — M6281 Muscle weakness (generalized): Secondary | ICD-10-CM | POA: Diagnosis not present

## 2020-11-17 DIAGNOSIS — R278 Other lack of coordination: Secondary | ICD-10-CM

## 2020-11-17 DIAGNOSIS — R2689 Other abnormalities of gait and mobility: Secondary | ICD-10-CM

## 2020-11-17 NOTE — Therapy (Signed)
[place] MAIN [facility] SERVICES [address] [place], [place], [address] Phone: [phone]   Fax:  [phone]  Occupational Therapy Treatment  Patient Details  Name: Cory [name] MRN: [id number] Date of Birth: April 14, 1955 No data recorded  Encounter Date: 11/17/2020   OT End of Session - 11/17/20 1655     Visit Number 9    Number of Visits 24    Date for OT Re-Evaluation 12/22/20    OT Start Time 1558    OT Stop Time 1645    OT Time Calculation (min) 47 min    Equipment Utilized During Treatment 4 wheeled walker    Activity Tolerance Patient tolerated treatment well    Behavior During Therapy WFL for tasks assessed/performed             Past Medical History:  Diagnosis Date   Arthritis    hips and back   Asthma    as a baby-    GERD (gastroesophageal reflux disease)    rare -    Parkinson's disease ([place])    Pneumonia    age 41   Rash    legs- on going    Past Surgical History:  Procedure Laterality Date   APPENDECTOMY     BACK SURGERY     posterior lumbar spine fusion L4-5   COLONOSCOPY     COLONOSCOPY WITH PROPOFOL N/A 06/06/2017   Procedure: COLONOSCOPY WITH PROPOFOL;  Surgeon: Manya Silvas, MD;  Location: [facility] ENDOSCOPY;  Service: Endoscopy;  Laterality: N/A;   KNEE ARTHROSCOPY Bilateral    ACL   KNEE ARTHROSCOPY W/ ACL RECONSTRUCTION     RADIAL KERATOTOMY     ROTATOR CUFF REPAIR Bilateral    SHOULDER ARTHROSCOPY WITH BICEPS TENDON REPAIR Left 04/16/2019   Procedure: SHOULDER ARTHROSCOPY WITH BICEPS TENDON REPAIR, MINI OPEN SUPERIOR CAPSULAR RECONSTRUCTION, BICEPS TENODESIS;  Surgeon: Leim Fabry, MD;  Location: [facility] ORS;  Service: Orthopedics;  Laterality: Left;    There were no vitals filed for this visit.   Subjective Assessment - 11/17/20 1654     Subjective  "I had a bad day with my walking over the weekend, but I didn't fall, and I had had 5 good days in a row before that."    Patient is  accompanied by: Family member    Pertinent History Parkinson's Disease, R sided weakness, falls    Limitations balance, strength, coordination, visual perception, problem solving, processing information    Patient Stated Goals "I want to get stronger.  It frustrates me when I have to ask for help with things like cutting my food or buttoning a button."    Currently in Pain? Yes    Pain Score 3     Pain Location Shoulder    Pain Orientation Left    Pain Descriptors / Indicators Aching    Pain Type Chronic pain    Pain Onset More than a month ago    Pain Frequency Intermittent    Aggravating Factors  raising arm up past shoulder level    Multiple Pain Sites No            Occupational Therapy Treatment: Therapeutic Exercise: Passive stretching completed to bilat shoulder for increasing flexibility for shoulder flex/abd/ER, scapular protraction/retraction with good tolerance.  Used 2 lb dowel with intermittent min A to L side to achieve full range with minimal compensatory movements for chest press, shoulder flex, horiz abd/add, abd, ER to top of head, and shoulder ext and  IR behind back x10 each.  Performed gripping/pinching/digit add/digging small buttons out of putty, working to improve [facility] for self care.  Extra time needed to find buttons with min vc for technique.  Reviewed diplopia "jumping" exercises with pt able to return demo with mod vc for technique.   Self Care: Simulated cutting food with fork/knife and pink putty; vc for placement of fork to stabilize putty.  Able to cut with extra time and min vc for technique.  Reinforced fall prevention, providing vc to position self close to objects when reaching.  Pt stood to place item in his back pack with extended reach, but positioned self closer with cues and demonstrated improved stability.    Response to Treatment: Pt reported that he had had 5 "good days" in a row last week and then had a difficult day over the weekend with his  walking.  Pt continues to be receptive to ADL compensation strategies and came today with backpack for his belongings which he can transport on his rollator, instead of carrying multiple objects in his pockets which he has found difficult to remove.  Pt continues to demo some impulsivity and decreased safety, but always receptive to fall prevention strategies.  Pt will continue to benefit from skilled OT for increasing BUE strength and coordination while reinforced safety for maximizing indep with self care.     OT Education - 11/17/20 1655     Education Details fall prevention    Person(s) Educated Patient    Methods Explanation;Verbal cues;Demonstration    Comprehension Verbalized understanding;Returned demonstration              OT Short Term Goals - 09/30/20 1210       OT SHORT TERM GOAL #1   Title Pt will perform BUE HEP with min vc    Baseline Eval: not yet initiated    Time 6    Period Weeks    Status New    Target Date 11/10/20      OT SHORT TERM GOAL #2   Title Pt will report RPE of 4 or better to indicate improved tolerance to basic ADLs.    Baseline Eval: RPE of 6 with basic ADLs.    Time 6    Period Weeks    Status New    Target Date 11/10/20      OT SHORT TERM GOAL #3   Title Pt will utilize visual compensation strategies with min vc in order to improve basic and IADL performance.    Baseline Eval: pt reports some diplopia and decreased depth perception which limits ADL/IADL performance.    Time 6    Period Weeks    Status New    Target Date 11/10/20               OT Long Term Goals - 09/30/20 1138       OT LONG TERM GOAL #1   Title Pt will increase bilat shoulder strength by 1MM grade in order to improve tolerance to UB ADLs/IADLs and reaching activities.    Baseline Eval: R/L shoulder flex/abd 4-/5, R/L shoulder ER 3+/5    Time 12    Period Weeks    Status New    Target Date 12/22/20      OT LONG TERM GOAL #2   Title Pt will manage clothing  fasteners and looping belt with modified indep.    Baseline Eval: spouse assists.  Difficulty with tying shoes, looping belt, snapping and buttoning buttons.  Time 12    Period Weeks    Status New    Target Date 12/22/20      OT LONG TERM GOAL #3   Title Pt will cut food with modified indep.    Baseline Eval: spouse assists to cut food    Time 12    Period Weeks    Status New    Target Date 12/22/20      OT LONG TERM GOAL #4   Title Pt will brush hair with R dominant arm with modified indep.    Baseline Eval: Pt states he must hold R arm up with L hand, and often spouse must assist.    Time 12    Period Weeks    Status New    Target Date 12/22/20      OT LONG TERM GOAL #5   Title Pt will increase FOTO score to 62 or better to indicate measureable functional improvement with ADLs/IADLs.    Baseline Eval: FOTO score of 58    Time 12    Period Weeks    Status New    Target Date 12/22/20                   Plan - 11/17/20 1711     Clinical Impression Statement Pt reported that he had had 5 "good days" in a row last week and then had a difficult day over the weekend with his walking.  Pt continues to be receptive to ADL compensation strategies and came today with backpack for his belongings which he can transport on his rollator, instead of carrying multiple objects in his pockets which he has found difficult to remove.  Pt continues to demo some impulsivity and decreased safety, but always receptive to fall prevention strategies.  Pt will continue to benefit from skilled OT for increasing BUE strength and coordination while reinforced safety for maximizing indep with self care.    OT Occupational Profile and History Detailed Assessment- Review of Records and additional review of physical, cognitive, psychosocial history related to current functional performance    Occupational performance deficits (Please refer to evaluation for details): ADL's;IADL's;Leisure    Body  Structure / Function / Physical Skills ADL;Coordination;Endurance;GMC;UE functional use;Balance;Sensation;IADL;Vision;Dexterity;FMC;Strength;Continence;Gait;Mobility    Rehab Potential Good    Clinical Decision Making Several treatment options, min-mod task modification necessary    Comorbidities Affecting Occupational Performance: May have comorbidities impacting occupational performance    Modification or Assistance to Complete Evaluation  Min-Moderate modification of tasks or assist with assess necessary to complete eval    OT Frequency 2x / week    OT Duration 12 weeks    OT Treatment/Interventions Self-care/ADL training;Therapeutic exercise;DME and/or AE instruction;Balance training;Neuromuscular education;Manual Therapy;Visual/perceptual remediation/compensation;Therapeutic activities;Patient/family education    Consulted and Agree with Plan of Care Patient             Patient will benefit from skilled therapeutic intervention in order to improve the following deficits and impairments:   Body Structure / Function / Physical Skills: ADL, Coordination, Endurance, GMC, UE functional use, Balance, Sensation, IADL, Vision, Dexterity, FMC, Strength, Continence, Gait, Mobility       Visit Diagnosis: Muscle weakness (generalized)  Other lack of coordination    Problem List Patient Active Problem List   Diagnosis Date Noted   Focal seizure ([place]) 05/24/2020   Parkinson's disease ([place])    History of migraine headaches    Migraine syndrome    BPH (benign prostatic hyperplasia)    Depression  Degenerative spondylolisthesis 05/07/2016   Leta Speller, MS, OTR/L  Darleene Cleaver 11/17/2020, 5:12 PM  [place] MAIN [facility] SERVICES [address] [place], [place], [address] Phone: [phone]   Fax:  [phone]  Name: KJUAN BLOCHER MRN: [id number] Date of Birth: [birth year]

## 2020-11-17 NOTE — Therapy (Signed)
[place] MAIN [facility] SERVICES [address] [place], [place], [address] Phone: [phone]   Fax:  [phone]  Physical Therapy Treatment  Patient Details  Name: Cory [name] MRN: [id number] Date of Birth: 1956-01-15 Referring Provider (PT): Benay Spice   Encounter Date: 11/17/2020   PT End of Session - 11/17/20 1635     Visit Number 15    Number of Visits 24    Date for PT Re-Evaluation 12/11/20    Authorization Type 5/10 PN 7/19.    PT Start Time 1646    PT Stop Time 1730    PT Time Calculation (min) 44 min    Equipment Utilized During Treatment Gait belt    Activity Tolerance Patient tolerated treatment well    Behavior During Therapy WFL for tasks assessed/performed             Past Medical History:  Diagnosis Date   Arthritis    hips and back   Asthma    as a baby-    GERD (gastroesophageal reflux disease)    rare -    Parkinson's disease ([place])    Pneumonia    age 65   Rash    legs- on going    Past Surgical History:  Procedure Laterality Date   APPENDECTOMY     BACK SURGERY     posterior lumbar spine fusion L4-5   COLONOSCOPY     COLONOSCOPY WITH PROPOFOL N/A 06/06/2017   Procedure: COLONOSCOPY WITH PROPOFOL;  Surgeon: Manya Silvas, MD;  Location: [facility] END[facility]COPY;  Service: Endoscopy;  Laterality: N/A;   KNEE ARTHROSCOPY Bilateral    ACL   KNEE ARTHROSCOPY W/ ACL RECONSTRUCTION     RADIAL KERATOTOMY     ROTATOR CUFF REPAIR Bilateral    SHOULDER ARTHROSCOPY WITH BICEPS TENDON REPAIR Left 04/16/2019   Procedure: SHOULDER ARTHROSCOPY WITH BICEPS TENDON REPAIR, MINI OPEN SUPERIOR CAPSULAR RECONSTRUCTION, BICEPS TENODESIS;  Surgeon: Leim Fabry, MD;  Location: [facility] ORS;  Service: Orthopedics;  Laterality: Left;    There were no vitals filed for this visit.   Subjective Assessment - 11/17/20 1648     Subjective Patient reports no falls or LOB since last session. Had three bad days in a row. Prior to  the bad days had five good days in a row.    Patient is accompained by: Family member    Pertinent History Patient admitted to hospital 05/23/20 with code stroke and R sided weakness. Imaging negative for acute changes. PMH includes Parkinson's with autonomic instability, asthma, GERD depression, degenerative spondylolisthesis, focal seizures and migraine syndrome. Patient had three ED visits for falls since then. Patient reports when he has the seizures he has pain in his temples and bright flashes in his vision. Last week had three falls in one day.    Limitations Lifting;Standing;Walking;House hold activities    How long can you sit comfortably? sleeps in easy chair.    How long can you stand comfortably? feel unsteady once in standing.    How long can you walk comfortably? difficult to walk during "off period"    Patient Stated Goals stability, have a device that will be most helpful, help to become and stay independent.    Currently in Pain? Yes    Pain Score 4     Pain Location Shoulder    Pain Orientation Right;Left    Pain Descriptors / Indicators Aching;Tightness    Pain Type Chronic pain    Pain Onset More  than a month ago    Pain Frequency Intermittent                Treat:   Neuro Re-ed Airex balance beam:  -lateral stepping 6x length of // bars occasional near LOB -static stand throw balls with PT for perturbation inside/outside BOS x 3 minutes   ambulate in hallway: -horizontal head turns with cues for reading alphabet from cards 86 ftx 2 sets -horizontal head turns with cues for reading number and symbols from cards 86 ft x 2 sets  -"red light/green light" for sudden initiation/termination of ambulation with close CGA for carryover to natural environment 2x 86 ft  -gait speed change between fast and slow with PT guidance 2x 86 ft    Step over three consecutive obstacles with focus on foot clearance and weight shift x 8 trials in // bars. Cues for reduction of  circumduction    Tilt board: Anterior/Posterior weight shift 12x each foot placement. Tilt board lateral: medial/lateral 15x each side    TherEx: Nustep Lvl 4 RPM> 60 for cardiovascular and musculoskeletal challenge x4 minutes  Posterior Walking lunges 6x length of // bars; occasional LOB requiring cues for sequencing and upright posture  Squat jumps from chair 12x with focus on arm swing ; one near LOB Lateral squat walks 4x length of // bars     Pt educated throughout session about proper posture and technique with exercises. Improved exercise technique, movement at target joints, use of target muscles after min to mod verbal, visual, tactile cues   Patient has increased head tilt towards the right resulting in increased instability in static stability. He has improved gait stability with decreased episodes of LOB with dual task. Patient has increased full body tremors by end of session as he is fatigued. . Patient will benefit from skilled physical therapy to improve balance, gait mechanics, and overall functional mobility                    PT Education - 11/17/20 1635     Education Details exercise technique, body mechanics    Person(s) Educated Patient    Methods Explanation;Demonstration;Tactile cues;Verbal cues    Comprehension Verbalized understanding;Returned demonstration;Verbal cues required;Tactile cues required              PT Short Term Goals - 10/21/20 0903       PT SHORT TERM GOAL #1   Title Patient will be independent in home exercise program to improve strength/mobility for better functional independence with ADLs.    Baseline 6/16: HEP to be given next session 7/19: HEP compliant    Time 4    Period Weeks    Status Achieved    Target Date 10/16/20               PT Long Term Goals - 10/21/20 1235       PT LONG TERM GOAL #1   Title Patient will increase FOTO score to equal to or greater than 51%    to demonstrate statistically  significant improvement in mobility and quality of life.    Baseline 6/16: 48% 7/19: 57%    Time 12    Period Weeks    Status Achieved      PT LONG TERM GOAL #2   Title Patient will increase Berg Balance score by > 6 points (34/56)  to demonstrate decreased fall risk during functional activities.    Baseline 6/16: 28/56 7/19: 45/56    Time  12    Period Weeks    Status Achieved      PT LONG TERM GOAL #3   Title Patient will deny any falls over past 4 weeks to demonstrate improved safety awareness at home and work.    Baseline 6/16: multiple falls 7/19: no falls reported    Time 12    Period Weeks    Status Achieved      PT LONG TERM GOAL #4   Title Patient will increase six minute walk test distance to >1300 for progression to age norm community ambulator and improve gait ability    Baseline 6/16: 945 ft with multiple near falls 7/19: 1495 ft    Time 12    Period Weeks    Status Achieved      PT LONG TERM GOAL #5   Title Patient will increase Berg Balance score by > 6 points (51/56)  to demonstrate decreased fall risk during functional activities.    Baseline 7/19: 45/56    Time 12    Period Weeks    Status New    Target Date 12/11/20      Additional Long Term Goals   Additional Long Term Goals Yes      PT LONG TERM GOAL #6   Title Patient will increase Functional Gait Assessment score to >20/30 as to reduce fall risk and improve dynamic gait safety with community ambulation.    Baseline 7/19: 16/30    Time 12    Period Weeks    Status New    Target Date 12/11/20                   Plan - 11/17/20 1741     Clinical Impression Statement Patient has increased head tilt towards the right resulting in increased instability in static stability. He has improved gait stability with decreased episodes of LOB with dual task. Patient has increased full body tremors by end of session as he is fatigued. . Patient will benefit from skilled physical therapy to improve  balance, gait mechanics, and overall functional mobility    Personal Factors and Comorbidities Age;Comorbidity 3+;Fitness;Past/Current Experience;Time since onset of injury/illness/exacerbation;Transportation    Comorbidities Parkinson's with autonomic instability, asthma, GERD depression, degenerative spondylolisthesis, focal seizures and migraine syndrome    Examination-Activity Limitations Bathing;Bed Mobility;Bend;Caring for [employer];Locomotion Level;Lift;Hygiene/Grooming;Squat;Stairs;Stand;Toileting;Transfers    Examination-Participation Restrictions Church;Cleaning;Community Activity;Driving;Meal Prep;Medication Management;Laundry;Shop;Volunteer;Yard Work    Stability/Clinical Decision Making Unstable/Unpredictable    Rehab Potential Fair    PT Frequency 2x / week    PT Duration 12 weeks    PT Treatment/Interventions ADLs/Self Care Home Management;Biofeedback;Canalith Repostioning;Cryotherapy;Parrafin;Ultrasound;Traction;Moist Heat;Iontophoresis 36m/ml Dexamethasone;Electrical Stimulation;DME Instruction;Gait training;Stair training;Functional mobility training;Neuromuscular re-education;Balance training;Therapeutic exercise;Therapeutic activities;Patient/family education;Orthotic Fit/Training;Manual techniques;Passive range of motion;Manual lymph drainage;Energy conservation;Splinting;Taping;Vasopneumatic Device;Vestibular;Joint Manipulations;Visual/perceptual remediation/compensation    PT Next Visit Plan tai chi, high level balance    PT Home Exercise Plan to give next session    Consulted and Agree with Plan of Care Patient;Family member/caregiver    Family Member Consulted wife             Patient will benefit from skilled therapeutic intervention in order to improve the following deficits and impairments:  Abnormal gait, Decreased activity tolerance, Decreased balance, Decreased knowledge of precautions, Decreased endurance, Decreased coordination,  Decreased cognition, Decreased knowledge of use of DME, Decreased mobility, Decreased safety awareness, Difficulty walking, Decreased strength, Impaired flexibility, Impaired perceived functional ability, Impaired sensation, Impaired tone, Impaired UE functional use, Impaired vision/preception, Improper body mechanics, Postural dysfunction  Visit Diagnosis: Muscle weakness (generalized)  Other lack of coordination  Other abnormalities of gait and mobility     Problem List Patient Active Problem List   Diagnosis Date Noted   Focal seizure ([place]) 05/24/2020   Parkinson's disease ([place])    History of migraine headaches    Migraine syndrome    BPH (benign prostatic hyperplasia)    Depression    Degenerative spondylolisthesis 05/07/2016   Janna Arch, PT, DPT  11/17/2020, 5:42 PM  [place] MAIN [facility] SERVICES [address] [place], [place], [address] Phone: [phone]   Fax:  [phone]  Name: Cory [name] MRN: [id number] Date of Birth: 1955-06-26

## 2020-11-19 ENCOUNTER — Ambulatory Visit: Payer: Medicare PPO

## 2020-11-19 ENCOUNTER — Other Ambulatory Visit: Payer: Self-pay

## 2020-11-19 DIAGNOSIS — M6281 Muscle weakness (generalized): Secondary | ICD-10-CM | POA: Diagnosis not present

## 2020-11-19 DIAGNOSIS — R278 Other lack of coordination: Secondary | ICD-10-CM

## 2020-11-19 NOTE — Therapy (Signed)
[place] MAIN [facility] SERVICES [address] [place], [place], [address] Phone: [phone]   Fax:  [phone]  Physical Therapy Treatment  Patient Details  Name: Cory [name] MRN: [id number] Date of Birth: 20-Aug-1955 Referring Provider (PT): Benay Spice   Encounter Date: 11/19/2020   PT End of Session - 11/19/20 1059     Visit Number 16    Number of Visits 24    Date for PT Re-Evaluation 12/11/20    Authorization Type [employer] P[employer]    Authorization Time Period 09/18/20-12/11/20    PT Start Time 1051    PT Stop Time 1131    PT Time Calculation (min) 40 min    Equipment Utilized During Treatment Gait belt    Activity Tolerance Patient tolerated treatment well    Behavior During Therapy WFL for tasks assessed/performed             Past Medical History:  Diagnosis Date   Arthritis    hips and back   Asthma    as a baby-    GERD (gastroesophageal reflux disease)    rare -    Parkinson's disease ([place])    Pneumonia    age 18   Rash    legs- on going    Past Surgical History:  Procedure Laterality Date   APPENDECTOMY     BACK SURGERY     posterior lumbar spine fusion L4-5   COLONOSCOPY     COLONOSCOPY WITH PROPOFOL N/A 06/06/2017   Procedure: COLONOSCOPY WITH PROPOFOL;  Surgeon: Manya Silvas, MD;  Location: [facility] ENDOSCOPY;  Service: Endoscopy;  Laterality: N/A;   KNEE ARTHROSCOPY Bilateral    ACL   KNEE ARTHROSCOPY W/ ACL RECONSTRUCTION     RADIAL KERATOTOMY     ROTATOR CUFF REPAIR Bilateral    SHOULDER ARTHROSCOPY WITH BICEPS TENDON REPAIR Left 04/16/2019   Procedure: SHOULDER ARTHROSCOPY WITH BICEPS TENDON REPAIR, MINI OPEN SUPERIOR CAPSULAR RECONSTRUCTION, BICEPS TENODESIS;  Surgeon: Leim Fabry, MD;  Location: [facility] ORS;  Service: Orthopedics;  Laterality: Left;    There were no vitals filed for this visit.   Subjective Assessment - 11/19/20 1055     Subjective Pt doing well today, no updates since  monday. No falls.    Pertinent History Patient admitted to hospital 05/23/20 with code stroke and R sided weakness. Imaging negative for acute changes. PMH includes Parkinson's with autonomic instability, asthma, GERD depression, degenerative spondylolisthesis, focal seizures and migraine syndrome. Patient had three ED visits for falls since then. Patient reports when he has the seizures he has pain in his temples and bright flashes in his vision. Last week had three falls in one day.    Currently in Pain? No/denies             INTERVENTION THIS DATE:  Airex balance beam:  -lateral stepping 7x length of // bars occasional near LOB, minGuard assist -airex pad self ball toss/catch x50 -airex pad rotation with floor rebound x40 -airex pad + rotation overhead wall rebound x50  -obstacle course in hallway 4 laps fwd, 4 laps sideways, no AD, minGuard assist  Floor transfers training:  -Prone (LUE pinned) left thoracic rotation x5 -prone to quadruped x10 -quadruped to BIG unilateral kick to  pre-halfkneeling x8 bilat  -halfkneeling to standing 5x bilat, hand/elbow on knee, walkup legs to upright   -17.5lb cable resisted lateral side stepping 2x each side, minguardA, no LOB      PT Short Term Goals -  10/21/20 0903       PT SHORT TERM GOAL #1   Title Patient will be independent in home exercise program to improve strength/mobility for better functional independence with ADLs.    Baseline 6/16: HEP to be given next session 7/19: HEP compliant    Time 4    Period Weeks    Status Achieved    Target Date 10/16/20               PT Long Term Goals - 10/21/20 1235       PT LONG TERM GOAL #1   Title Patient will increase FOTO score to equal to or greater than 51%    to demonstrate statistically significant improvement in mobility and quality of life.    Baseline 6/16: 48% 7/19: 57%    Time 12    Period Weeks    Status Achieved      PT LONG TERM GOAL #2   Title Patient will  increase Berg Balance score by > 6 points (34/56)  to demonstrate decreased fall risk during functional activities.    Baseline 6/16: 28/56 7/19: 45/56    Time 12    Period Weeks    Status Achieved      PT LONG TERM GOAL #3   Title Patient will deny any falls over past 4 weeks to demonstrate improved safety awareness at home and work.    Baseline 6/16: multiple falls 7/19: no falls reported    Time 12    Period Weeks    Status Achieved      PT LONG TERM GOAL #4   Title Patient will increase six minute walk test distance to >1300 for progression to age norm community ambulator and improve gait ability    Baseline 6/16: 945 ft with multiple near falls 7/19: 1495 ft    Time 12    Period Weeks    Status Achieved      PT LONG TERM GOAL #5   Title Patient will increase Berg Balance score by > 6 points (51/56)  to demonstrate decreased fall risk during functional activities.    Baseline 7/19: 45/56    Time 12    Period Weeks    Status New    Target Date 12/11/20      Additional Long Term Goals   Additional Long Term Goals Yes      PT LONG TERM GOAL #6   Title Patient will increase Functional Gait Assessment score to >20/30 as to reduce fall risk and improve dynamic gait safety with community ambulation.    Baseline 7/19: 16/30    Time 12    Period Weeks    Status New    Target Date 12/11/20                   Plan - 11/19/20 1106     Clinical Impression Statement Continued with current plan of care as laid out in evaluation and recent prior sessions. Pt remains motivated to advance progress toward goals in order to maximize independence and safety at home. Pt requires high level assistance and cuing for completion of exercises in order to provide adequate level of stimulation and perturbation. Author allows pt as much opportunity as possible to perform independent righting strategies, only stepping in when pt is unable to prevent falling to floor. Pt asks to work on floor  transfers in light of recent post-fall difficulty.  Pt continues to demonstrate progress toward goals AEB progression of some  interventions this date either in volume or intensity.   Personal Factors and Comorbidities Age;Comorbidity 3+;Fitness;Past/Current Experience;Time since onset of injury/illness/exacerbation;Transportation    Comorbidities Parkinson's with autonomic instability, asthma, GERD depression, degenerative spondylolisthesis, focal seizures and migraine syndrome    Examination-Activity Limitations Bathing;Bed Mobility;Bend;Caring for [employer];Locomotion Level;Lift;Hygiene/Grooming;Squat;Stairs;Stand;Toileting;Transfers    Examination-Participation Restrictions Church;Cleaning;Community Activity;Driving;Meal Prep;Medication Management;Laundry;Shop;Volunteer;Yard Work    Stability/Clinical Decision Making Unstable/Unpredictable    Surveyor, mining    Rehab Potential Fair    PT Frequency 2x / week    PT Duration 12 weeks    PT Treatment/Interventions ADLs/Self Care Home Management;Biofeedback;Canalith Repostioning;Cryotherapy;Parrafin;Ultrasound;Traction;Moist Heat;Iontophoresis 79m/ml Dexamethasone;Electrical Stimulation;DME Instruction;Gait training;Stair training;Functional mobility training;Neuromuscular re-education;Balance training;Therapeutic exercise;Therapeutic activities;Patient/family education;Orthotic Fit/Training;Manual techniques;Passive range of motion;Manual lymph drainage;Energy conservation;Splinting;Taping;Vasopneumatic Device;Vestibular;Joint Manipulations;Visual/perceptual remediation/compensation    PT Next Visit Plan tai chi, high level balance    PT Home Exercise Plan to give next session    Consulted and Agree with Plan of Care Patient             Patient will benefit from skilled therapeutic intervention in order to improve the following deficits and impairments:  Abnormal gait, Decreased activity tolerance,  Decreased balance, Decreased knowledge of precautions, Decreased endurance, Decreased coordination, Decreased cognition, Decreased knowledge of use of DME, Decreased mobility, Decreased safety awareness, Difficulty walking, Decreased strength, Impaired flexibility, Impaired perceived functional ability, Impaired sensation, Impaired tone, Impaired UE functional use, Impaired vision/preception, Improper body mechanics, Postural dysfunction  Visit Diagnosis: Muscle weakness (generalized)  Other lack of coordination     Problem List Patient Active Problem List   Diagnosis Date Noted   [place] 05/[place]   [place]'s disease [place]    History of migraine headaches    Migraine syndrome    BPH (benign prostatic hyperplasia)    Depression    Degenerative spondylolisthesis 05/07/2016   11:34 AM, 11/19/20 AEtta Grandchild PT, DPT Physical Therapist - [email or web address] 11/19/2020, 11:32 AM  [place] StreetRAlpha N[place] [address]Phone: [phone]  Fax:  [phone] Name: TTREYSEN SUDBECKMRN: [id number] of Birth: [birth year]

## 2020-11-20 NOTE — Therapy (Signed)
[place] MAIN [facility] SERVICES [address] [place], [place], [address] Phone: [phone]   Fax:  [phone]  Occupational Therapy Treatment/Progress Note Reporting period 09/30/2020-11/19/2020 Patient Details  Name: Cory [name] MRN: [id number] Date of Birth: [birth year] No data recorded  Encounter Date: 11/19/2020   OT End of Session - 11/20/20 1052     Visit Number 10    Number of Visits 24    Date for OT Re-Evaluation 12/22/20    Authorization Time Period Reporting period 09/30/2020-11/19/2020    OT Start Time 1145    OT Stop Time 24    OT Time Calculation (min) 45 min    Equipment Utilized During Treatment 4 wheeled walker    Activity Tolerance Patient tolerated treatment well    Behavior During Therapy WFL for tasks assessed/performed             Past Medical History:  Diagnosis Date   Arthritis    hips and back   Asthma    as a baby-    GERD (gastroesophageal reflux disease)    rare -    Parkinson's disease ([place])    Pneumonia    age 76   Rash    legs- on going    Past Surgical History:  Procedure Laterality Date   APPENDECTOMY     BACK SURGERY     posterior lumbar spine fusion L4-5   COLONOSCOPY     COLONOSCOPY WITH PROPOFOL N/A 06/06/2017   Procedure: COLONOSCOPY WITH PROPOFOL;  Surgeon: Manya Silvas, MD;  Location: [facility] ENDOSCOPY;  Service: Endoscopy;  Laterality: N/A;   KNEE ARTHROSCOPY Bilateral    ACL   KNEE ARTHROSCOPY W/ ACL RECONSTRUCTION     RADIAL KERATOTOMY     ROTATOR CUFF REPAIR Bilateral    SHOULDER ARTHROSCOPY WITH BICEPS TENDON REPAIR Left 04/16/2019   Procedure: SHOULDER ARTHROSCOPY WITH BICEPS TENDON REPAIR, MINI OPEN SUPERIOR CAPSULAR RECONSTRUCTION, BICEPS TENODESIS;  Surgeon: Leim Fabry, MD;  Location: [facility] ORS;  Service: Orthopedics;  Laterality: Left;    There were no vitals filed for this visit.   Subjective Assessment - 11/19/20 1443     Subjective  "I need to keep building  up my UB strength."    Pertinent History Parkinson's Disease, R sided weakness, falls    Limitations balance, strength, coordination, visual perception, problem solving, processing information    Patient Stated Goals "I want to get stronger.  It frustrates me when I have to ask for help with things like cutting my food or buttoning a button."    Currently in Pain? No/denies    Pain Score 0-No pain    Pain Onset More than a month ago            Occupational Therapy Treatment/Progress Note: Therapeutic Exercise: Participation in BUE strengthening and building activity tolerance for self care.  Used UBE with min resistance x5 min in standing; Good tolerance with forward motion 2.5 min, increased difficulty with reverse motion 2.5 min; vc for pacing strategies for improved tolerance.  Used 1.5 lb dowel for BUE strengthening for chest press, shoulder flex, horiz abd/add, ER to top of head x3 sets 10 reps each, intermittent CGA/min A to maintain good body mechanics.   Self care: Reviewed home safety/fall prevention with encouragement to keep phone in pocket/walker or within reach at all times.  Reinforced keeping pathways clear, turning lights on when up at night, making slow positioning changes, and being consistent to use AD for ambulation  to reduce fall risk.  Pt verbalized understanding.  Response to Treatment: Pt making steady gains with all OT goals.  Pt reports overall his stamina for self care is improving, and he reports that he has more consecutive "good days" than before he started therapy.  Pt reported that prior to starting therapy, he may only have 2-3 "good days" in a row, and he has had up to 5 consecutive since starting therapy, in addition to no falls since the start of therapy and using his 4 wheeled walker.  Pt reports he is inconsistent to use his walker or cane at home, and reports that he can sense when one or the other is needed, or when none are needed.  Still PT/OT are  recommending AD at all times to reduce fall risk.  Pt continues to present with BUE weakness and decreased activity tolerance, and will continue to benefit from OT to address these deficits which impact self care tolerance, while reinforcing ADL strategies in order to maximize indep in the home d/t the progressive nature of his PD.         OT Education - 11/19/20 1444     Education Details fall prevention/home safety    Person(s) Educated Patient    Methods Explanation;Verbal cues;Demonstration    Comprehension Verbalized understanding;Returned demonstration              OT Short Term Goals - 11/20/20 1054       OT SHORT TERM GOAL #1   Title Pt will perform BUE HEP with min vc    Baseline Eval: not yet initiated; 11/19/2020: Pt able to perform with min vc    Time 6    Period Weeks    Status Achieved    Target Date 11/10/20      OT SHORT TERM GOAL #2   Title Pt will report RPE of 4 or better to indicate improved tolerance to basic ADLs.    Baseline Eval: RPE of 6 with basic ADLs; 11/19/2020: RPE of 5 with basic ADLs    Time 6    Period Weeks    Status On-going    Target Date 11/10/20      OT SHORT TERM GOAL #3   Title Pt will utilize visual compensation strategies with min vc in order to improve basic and IADL performance.    Baseline Eval: pt reports some diplopia and decreased depth perception which limits ADL/IADL performance; Pt has been instructed in diplopia exercises and requires mod vc for compensation strategies when diplopia is present    Time 6    Period Weeks    Status On-going               OT Long Term Goals - 11/20/20 1058       OT LONG TERM GOAL #1   Title Pt will increase bilat shoulder strength by 1MM grade in order to improve tolerance to UB ADLs/IADLs and reaching activities.    Baseline Eval: R/L shoulder flex/abd 4-/5, R/L shoulder ER 3+/5; stamina is improving but strength grades still same as eval    Time 12    Period Weeks    Status  On-going      OT LONG TERM GOAL #2   Title Pt will manage clothing fasteners and looping belt with modified indep.    Baseline Eval: spouse assists.  Difficulty with tying shoes, looping belt, snapping and buttoning buttons; Using compensatory strategies for shoe tying as pt now has elastic shoe laces.  Pt now managing his own belt with slight extra time.  Increased time to fasten buttons.    Time 12    Period Weeks    Status On-going    Target Date 12/22/20      OT LONG TERM GOAL #3   Title Pt will cut food with modified indep.    Baseline Eval: spouse assists to cut food; Indep to cut food.    Time 12    Period Weeks    Status Achieved    Target Date 12/22/20      OT LONG TERM GOAL #4   Title Pt will brush hair with R dominant arm with modified indep.    Baseline Eval: Pt states he must hold R arm up with L hand, and often spouse must assist; 11/19/2020: Pt can reach to top and back of head with RUE but arm fatigues quickly with repetition required to brush hair.    Time 12    Period Weeks    Status On-going    Target Date 12/22/20                   Plan - 11/19/20 1119     Clinical Impression Statement Pt making steady gains with all OT goals.  Pt reports overall his stamina for self care is improving, and he reports that he has more consecutive "good days" than before he started therapy.  Pt reported that prior to starting therapy, he may only have 2-3 "good days" in a row, and he has had up to 5 consecutive since starting therapy, in addition to no falls since the start of therapy and using his 4 wheeled walker.  Pt reports he is inconsistent to use his walker or cane at home, and reports that he can sense when one or the other is needed, or when none are needed.  Still PT/OT are recommending AD at all times to reduce fall risk.  Pt continues to present with BUE weakness and decreased activity tolerance, and will continue to benefit from OT to address these deficits which  impact self care tolerance, while reinforcing ADL strategies in order to maximize indep in the home d/t the progressive nature of his PD.    OT Occupational Profile and History Detailed Assessment- Review of Records and additional review of physical, cognitive, psychosocial history related to current functional performance    Occupational performance deficits (Please refer to evaluation for details): ADL's;IADL's;Leisure    Body Structure / Function / Physical Skills ADL;Coordination;Endurance;GMC;UE functional use;Balance;Sensation;IADL;Vision;Dexterity;FMC;Strength;Continence;Gait;Mobility    Rehab Potential Good    Clinical Decision Making Several treatment options, min-mod task modification necessary    Comorbidities Affecting Occupational Performance: May have comorbidities impacting occupational performance    Modification or Assistance to Complete Evaluation  Min-Moderate modification of tasks or assist with assess necessary to complete eval    OT Frequency 2x / week    OT Duration 12 weeks    OT Treatment/Interventions Self-care/ADL training;Therapeutic exercise;DME and/or AE instruction;Balance training;Neuromuscular education;Manual Therapy;Visual/perceptual remediation/compensation;Therapeutic activities;Patient/family education    Consulted and Agree with Plan of Care Patient             Patient will benefit from skilled therapeutic intervention in order to improve the following deficits and impairments:   Body Structure / Function / Physical Skills: ADL, Coordination, Endurance, GMC, UE functional use, Balance, Sensation, IADL, Vision, Dexterity, FMC, Strength, Continence, Gait, Mobility       Visit Diagnosis: Muscle weakness (generalized)  Other lack of coordination  Problem List Patient Active Problem List   Diagnosis Date Noted   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

## 2020-11-21 ENCOUNTER — Ambulatory Visit
Admission: RE | Admit: 2020-11-21 | Discharge: 2020-11-21 | Disposition: A | Discharge status: Home / Self Care | Payer: Medicare PPO | Source: Ambulatory Visit | Attending: Student | Admitting: Student

## 2020-11-21 ENCOUNTER — Other Ambulatory Visit: Payer: Self-pay

## 2020-11-21 DIAGNOSIS — G903 Multi-system degeneration of the autonomic nervous system: Secondary | ICD-10-CM | POA: Insufficient documentation

## 2020-11-21 DIAGNOSIS — G20 Parkinson's disease: Secondary | ICD-10-CM | POA: Insufficient documentation

## 2020-11-21 DIAGNOSIS — G43009 Migraine without aura, not intractable, without status migrainosus: Secondary | ICD-10-CM | POA: Insufficient documentation

## 2020-11-24 ENCOUNTER — Ambulatory Visit: Payer: Medicare PPO

## 2020-11-24 ENCOUNTER — Other Ambulatory Visit: Payer: Self-pay

## 2020-11-24 DIAGNOSIS — M6281 Muscle weakness (generalized): Secondary | ICD-10-CM

## 2020-11-24 DIAGNOSIS — R278 Other lack of coordination: Secondary | ICD-10-CM

## 2020-11-24 DIAGNOSIS — R2689 Other abnormalities of gait and mobility: Secondary | ICD-10-CM

## 2020-11-24 NOTE — Therapy (Signed)
[place] MAIN [facility] SERVICES [address] [place], [place], [address] Phone: [phone]   Fax:  [phone]  Physical Therapy Treatment  Patient Details  Name: Cory [name] MRN: [id number] Date of Birth: 1955/07/29 Referring Provider (PT): Benay Spice   Encounter Date: 11/24/2020   PT End of Session - 11/24/20 1540     Visit Number 17    Number of Visits 24    Date for PT Re-Evaluation 12/11/20    Authorization Type [employer] P[employer]    Authorization Time Period 09/18/20-12/11/20    PT Start Time 1601    PT Stop Time 1645    PT Time Calculation (min) 44 min    Equipment Utilized During Treatment Gait belt    Activity Tolerance Patient tolerated treatment well    Behavior During Therapy WFL for tasks assessed/performed             Past Medical History:  Diagnosis Date   Arthritis    hips and back   Asthma    as a baby-    GERD (gastroesophageal reflux disease)    rare -    Parkinson's disease ([place])    Pneumonia    age 65   Rash    legs- on going    Past Surgical History:  Procedure Laterality Date   APPENDECTOMY     BACK SURGERY     posterior lumbar spine fusion L4-5   COLONOSCOPY     COLONOSCOPY WITH PROPOFOL N/A 06/06/2017   Procedure: COLONOSCOPY WITH PROPOFOL;  Surgeon: Manya Silvas, MD;  Location: [facility] ENDOSCOPY;  Service: Endoscopy;  Laterality: N/A;   KNEE ARTHROSCOPY Bilateral    ACL   KNEE ARTHROSCOPY W/ ACL RECONSTRUCTION     RADIAL KERATOTOMY     ROTATOR CUFF REPAIR Bilateral    SHOULDER ARTHROSCOPY WITH BICEPS TENDON REPAIR Left 04/16/2019   Procedure: SHOULDER ARTHROSCOPY WITH BICEPS TENDON REPAIR, MINI OPEN SUPERIOR CAPSULAR RECONSTRUCTION, BICEPS TENODESIS;  Surgeon: Leim Fabry, MD;  Location: [facility] ORS;  Service: Orthopedics;  Laterality: Left;    There were no vitals filed for this visit.   Subjective Assessment - 11/24/20 1619     Subjective Patient reports he had three bad days  in a row. Is not having a good day today. No falls though, used walker    Pertinent History Patient admitted to hospital 05/23/20 with code stroke and R sided weakness. Imaging negative for acute changes. PMH includes Parkinson's with autonomic instability, asthma, GERD depression, degenerative spondylolisthesis, focal seizures and migraine syndrome. Patient had three ED visits for falls since then. Patient reports when he has the seizures he has pain in his temples and bright flashes in his vision. Last week had three falls in one day.    Currently in Pain? No/denies                 Treat:   Neuro Re-ed Three hedgehog taps: 10x each LE cues for tapping top of hedgehog. Occasional near LOB   ambulate in hallway: -small ball toss vertically with steps 2x 86 ft -ambulate without AD x 300 ft cues for large first step.   Standing with CGA next to support surface:  Airex pad: static stand 30 seconds x 2 trials, noticeable trembling of ankles/LE's with fatigue and challenge to maintain stability Airex pad: horizontal head turns 30 seconds scanning room 10x ; cueing for arc of motion  Airex pad: vertical head turns 30 seconds, cueing for arc  of motion, noticeable sway with upward gaze increasing demand on ankle righting reaction musculature Airex pad: one foot on 6" step one foot on airex pad, hold position for 30 seconds, switch legs, 2x each LE; pad      TherEx:  Sit to stand throw ball at PT 12x ; two plops  Posterior lunges 10x each LE  occasional LOB requiring cues for sequencing and upright posture ; SUE support Lateral squat to narrow base squat 10x each side ; BUE support  Adduction ball squeeze 10x 3 second holds Adduction ball between ankles with LAQ 10x  BTB hamstring curl 15x each LE GTB around bilateral ankles: alternating ER/IR 15x each LE    Pt educated throughout session about proper posture and technique with exercises. Improved exercise technique, movement at target  joints, use of target muscles after min to mod verbal, visual, tactile cues   Patient is highly motivated despite today being an "off day" frequent shuffle steppage and increased challenge with initiation of movement noted. Close CGA required throughout physical therapy session. Dual task interventions not tolerated this session. Patient will benefit from skilled physical therapy to improve balance, gait mechanics, and overall functional mobility                     PT Education - 11/24/20 1539     Education Details exercise technique, body mechanics    Person(s) Educated Patient    Methods Explanation;Demonstration;Tactile cues;Verbal cues    Comprehension Verbalized understanding;Returned demonstration;Verbal cues required;Tactile cues required              PT Short Term Goals - 10/21/20 0903       PT SHORT TERM GOAL #1   Title Patient will be independent in home exercise program to improve strength/mobility for better functional independence with ADLs.    Baseline 6/16: HEP to be given next session 7/19: HEP compliant    Time 4    Period Weeks    Status Achieved    Target Date 10/16/20               PT Long Term Goals - 10/21/20 1235       PT LONG TERM GOAL #1   Title Patient will increase FOTO score to equal to or greater than 51%    to demonstrate statistically significant improvement in mobility and quality of life.    Baseline 6/16: 48% 7/19: 57%    Time 12    Period Weeks    Status Achieved      PT LONG TERM GOAL #2   Title Patient will increase Berg Balance score by > 6 points (34/56)  to demonstrate decreased fall risk during functional activities.    Baseline 6/16: 28/56 7/19: 45/56    Time 12    Period Weeks    Status Achieved      PT LONG TERM GOAL #3   Title Patient will deny any falls over past 4 weeks to demonstrate improved safety awareness at home and work.    Baseline 6/16: multiple falls 7/19: no falls reported    Time 12     Period Weeks    Status Achieved      PT LONG TERM GOAL #4   Title Patient will increase six minute walk test distance to >1300 for progression to age norm community ambulator and improve gait ability    Baseline 6/16: 945 ft with multiple near falls 7/19: 1495 ft    Time 12  Period Weeks    Status Achieved      PT LONG TERM GOAL #5   Title Patient will increase Berg Balance score by > 6 points (51/56)  to demonstrate decreased fall risk during functional activities.    Baseline 7/19: 45/56    Time 12    Period Weeks    Status New    Target Date 12/11/20      Additional Long Term Goals   Additional Long Term Goals Yes      PT LONG TERM GOAL #6   Title Patient will increase Functional Gait Assessment score to >20/30 as to reduce fall risk and improve dynamic gait safety with community ambulation.    Baseline 7/19: 16/30    Time 12    Period Weeks    Status New    Target Date 12/11/20                   Plan - 11/24/20 1630     Clinical Impression Statement Patient is highly motivated despite today being an "off day" frequent shuffle steppage and increased challenge with initiation of movement noted. Close CGA required throughout physical therapy session. Dual task interventions not tolerated this session. Patient will benefit from skilled physical therapy to improve balance, gait mechanics, and overall functional mobility    Personal Factors and Comorbidities Age;Comorbidity 3+;Fitness;Past/Current Experience;Time since onset of injury/illness/exacerbation;Transportation    Comorbidities Parkinson's with autonomic instability, asthma, GERD depression, degenerative spondylolisthesis, focal seizures and migraine syndrome    Examination-Activity Limitations Bathing;Bed Mobility;Bend;Caring for [employer];Locomotion Level;Lift;Hygiene/Grooming;Squat;Stairs;Stand;Toileting;Transfers    Examination-Participation Restrictions  Church;Cleaning;Community Activity;Driving;Meal Prep;Medication Management;Laundry;Shop;Volunteer;Yard Work    Stability/Clinical Decision Making Unstable/Unpredictable    Rehab Potential Fair    PT Frequency 2x / week    PT Duration 12 weeks    PT Treatment/Interventions ADLs/Self Care Home Management;Biofeedback;Canalith Repostioning;Cryotherapy;Parrafin;Ultrasound;Traction;Moist Heat;Iontophoresis 28m/ml Dexamethasone;Electrical Stimulation;DME Instruction;Gait training;Stair training;Functional mobility training;Neuromuscular re-education;Balance training;Therapeutic exercise;Therapeutic activities;Patient/family education;Orthotic Fit/Training;Manual techniques;Passive range of motion;Manual lymph drainage;Energy conservation;Splinting;Taping;Vasopneumatic Device;Vestibular;Joint Manipulations;Visual/perceptual remediation/compensation    PT Next Visit Plan tai chi, high level balance    PT Home Exercise Plan to give next session    Consulted and Agree with Plan of Care Patient;Family member/caregiver    Family Member Consulted wife             Patient will benefit from skilled therapeutic intervention in order to improve the following deficits and impairments:  Abnormal gait, Decreased activity tolerance, Decreased balance, Decreased knowledge of precautions, Decreased endurance, Decreased coordination, Decreased cognition, Decreased knowledge of use of DME, Decreased mobility, Decreased safety awareness, Difficulty walking, Decreased strength, Impaired flexibility, Impaired perceived functional ability, Impaired sensation, Impaired tone, Impaired UE functional use, Impaired vision/preception, Improper body mechanics, Postural dysfunction  Visit Diagnosis: Muscle weakness (generalized)  Other lack of coordination  Other abnormalities of gait and mobility     Problem List Patient Active Problem List   Diagnosis Date Noted   [place]   [place]'s [place]  [place]    History of migraine headaches    Migraine syndrome    BPH (benign prostatic hyperplasia)    Depression    Degenerative spondylolisthesis 05/07/2016    MJanna Arch PT, DPT  11/24/2020, 5:41 PM  [place] [facility] [address] [place] [address]Phone: [phone]  Fax:  [phone] Name: Cory BYERSMRN: [id number] of Birth: [birth year]

## 2020-11-25 NOTE — Therapy (Signed)
[place] MAIN [facility] SERVICES [address] [place], [place], [address] Phone: [phone]   Fax:  [phone]  Occupational Therapy Treatment  Patient Details  Name: Cory [name] MRN: [id number] Date of Birth: 12-18-1955 No data recorded  Encounter Date: 11/24/2020   OT End of Session - 11/25/20 0805     Visit Number 11    Number of Visits 24    Date for OT Re-Evaluation 12/22/20    Authorization Time Period Reporting period 09/30/2020-11/19/2020    OT Start Time 1515    OT Stop Time 1600    OT Time Calculation (min) 45 min    Equipment Utilized During Treatment 4 wheeled walker    Activity Tolerance Patient tolerated treatment well    Behavior During Therapy WFL for tasks assessed/performed             Past Medical History:  Diagnosis Date   Arthritis    hips and back   Asthma    as a baby-    GERD (gastroesophageal reflux disease)    rare -    Parkinson's disease ([place])    Pneumonia    age 54   Rash    legs- on going    Past Surgical History:  Procedure Laterality Date   APPENDECTOMY     BACK SURGERY     posterior lumbar spine fusion L4-5   COLONOSCOPY     COLONOSCOPY WITH PROPOFOL N/A 06/06/2017   Procedure: COLONOSCOPY WITH PROPOFOL;  Surgeon: Manya Silvas, MD;  Location: [facility] ENDOSCOPY;  Service: Endoscopy;  Laterality: N/A;   KNEE ARTHROSCOPY Bilateral    ACL   KNEE ARTHROSCOPY W/ ACL RECONSTRUCTION     RADIAL KERATOTOMY     ROTATOR CUFF REPAIR Bilateral    SHOULDER ARTHROSCOPY WITH BICEPS TENDON REPAIR Left 04/16/2019   Procedure: SHOULDER ARTHROSCOPY WITH BICEPS TENDON REPAIR, MINI OPEN SUPERIOR CAPSULAR RECONSTRUCTION, BICEPS TENODESIS;  Surgeon: Leim Fabry, MD;  Location: [facility] ORS;  Service: Orthopedics;  Laterality: Left;    There were no vitals filed for this visit.   Subjective Assessment - 11/24/20 0803     Subjective  "I had a tough weekend and bad 3 days.  I've been vibrating constantly."     Patient is accompanied by: Family member    Pertinent History Parkinson's Disease, R sided weakness, falls    Limitations balance, strength, coordination, visual perception, problem solving, processing information    Patient Stated Goals "I want to get stronger.  It frustrates me when I have to ask for help with things like cutting my food or buttoning a button."    Currently in Pain? Yes    Pain Score 3     Pain Location Shoulder    Pain Orientation Left    Pain Descriptors / Indicators Aching;Sore    Pain Type Chronic pain    Pain Onset More than a month ago    Pain Frequency Intermittent    Aggravating Factors  raising arm up past shoulder level    Multiple Pain Sites No            Occupational Therapy Treatment: Therapeutic Exercise: Participation in BUE strengthening/activity tolerance to ease tolerance to ADLs.  Used UBE in standing x5 min with moderate resistance 2.5 min forward, 2.5 min backward with good tolerance.  Used 1.5 dowel to complete 3 sets 10 reps of chest press, shoulder flexion, ER, horiz abd/add.  Required min vc/occasional tactile cues to maintain good body mechanics  and reduce compensatory leaning.  Required min A from OT on 3rd set to maintain body mechanics.   Self Care: Issued swivel spork with self feeding simulation at table top.  Encouraged pt practice with "difficult" foods at home where he finds me typically makes a mess when trying to use silverware.  Reviewed EC strategies for weekly/daily routines to avoid over exertion on "good days."   Reviewed fall prevention strategies with good report of carryover at home.  Pt reported that he had flipped on lights at home when getting up at night, and was more consistent to use his rollator.   Response to Treatment: Pt reported that he had had a tough weekend, having had 3 "bad days" in a row where he felt like he was "vibrating" constantly, meaning full body tremors.  Pt had lower energy level today, but  tolerated tx with frequent rest breaks.  Pt will continue to benefit from skilled OT for ADL modification training, AE training, and progression of HEP in order to maximize indep/consistency/efficiency with self care tasks.      OT Education - 11/24/20 0805     Education Details EC strategies    Person(s) Educated Patient    Methods Explanation;Verbal cues;Demonstration    Comprehension Verbalized understanding;Returned demonstration              OT Short Term Goals - 11/20/20 1054       OT SHORT TERM GOAL #1   Title Pt will perform BUE HEP with min vc    Baseline Eval: not yet initiated; 11/19/2020: Pt able to perform with min vc    Time 6    Period Weeks    Status Achieved    Target Date 11/10/20      OT SHORT TERM GOAL #2   Title Pt will report RPE of 4 or better to indicate improved tolerance to basic ADLs.    Baseline Eval: RPE of 6 with basic ADLs; 11/19/2020: RPE of 5 with basic ADLs    Time 6    Period Weeks    Status On-going    Target Date 11/10/20      OT SHORT TERM GOAL #3   Title Pt will utilize visual compensation strategies with min vc in order to improve basic and IADL performance.    Baseline Eval: pt reports some diplopia and decreased depth perception which limits ADL/IADL performance; Pt has been instructed in diplopia exercises and requires mod vc for compensation strategies when diplopia is present    Time 6    Period Weeks    Status On-going               OT Long Term Goals - 11/20/20 1058       OT LONG TERM GOAL #1   Title Pt will increase bilat shoulder strength by 1MM grade in order to improve tolerance to UB ADLs/IADLs and reaching activities.    Baseline Eval: R/L shoulder flex/abd 4-/5, R/L shoulder ER 3+/5; stamina is improving but strength grades still same as eval    Time 12    Period Weeks    Status On-going      OT LONG TERM GOAL #2   Title Pt will manage clothing fasteners and looping belt with modified indep.    Baseline  Eval: spouse assists.  Difficulty with tying shoes, looping belt, snapping and buttoning buttons; Using compensatory strategies for shoe tying as pt now has elastic shoe laces.  Pt now managing his own belt  with slight extra time.  Increased time to fasten buttons.    Time 12    Period Weeks    Status On-going    Target Date 12/22/20      OT LONG TERM GOAL #3   Title Pt will cut food with modified indep.    Baseline Eval: spouse assists to cut food; Indep to cut food.    Time 12    Period Weeks    Status Achieved    Target Date 12/22/20      OT LONG TERM GOAL #4   Title Pt will brush hair with R dominant arm with modified indep.    Baseline Eval: Pt states he must hold R arm up with L hand, and often spouse must assist; 11/19/2020: Pt can reach to top and back of head with RUE but arm fatigues quickly with repetition required to brush hair.    Time 12    Period Weeks    Status On-going    Target Date 12/22/20                   Plan - 11/24/20 0816     Clinical Impression Statement Pt reported that he had had a tough weekend, having had 3 "bad days" in a row where he felt like he was "vibrating" constantly, meaning full body tremors.  Pt had lower energy level today, but tolerated tx with frequent rest breaks.  Pt will continue to benefit from skilled OT for ADL modification training, AE training, and progression of HEP in order to maximize indep/consistency/efficiency with self care tasks.    OT Occupational Profile and History Detailed Assessment- Review of Records and additional review of physical, cognitive, psychosocial history related to current functional performance    Occupational performance deficits (Please refer to evaluation for details): ADL's;IADL's;Leisure    Body Structure / Function / Physical Skills ADL;Coordination;Endurance;GMC;UE functional use;Balance;Sensation;IADL;Vision;Dexterity;FMC;Strength;Continence;Gait;Mobility    Rehab Potential Good    Clinical  Decision Making Several treatment options, min-mod task modification necessary    Comorbidities Affecting Occupational Performance: May have comorbidities impacting occupational performance    Modification or Assistance to Complete Evaluation  Min-Moderate modification of tasks or assist with assess necessary to complete eval    OT Frequency 2x / week    OT Duration 12 weeks    OT Treatment/Interventions Self-care/ADL training;Therapeutic exercise;DME and/or AE instruction;Balance training;Neuromuscular education;Manual Therapy;Visual/perceptual remediation/compensation;Therapeutic activities;Patient/family education    Consulted and Agree with Plan of Care Patient             Patient will benefit from skilled therapeutic intervention in order to improve the following deficits and impairments:   Body Structure / Function / Physical Skills: ADL, Coordination, Endurance, GMC, UE functional use, Balance, Sensation, IADL, Vision, Dexterity, FMC, Strength, Continence, Gait, Mobility       Visit Diagnosis: Other lack of coordination  Muscle weakness (generalized)    Problem List Patient Active Problem List   Diagnosis Date Noted   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

## 2020-11-26 ENCOUNTER — Ambulatory Visit: Payer: Medicare PPO

## 2020-11-26 ENCOUNTER — Other Ambulatory Visit: Payer: Self-pay

## 2020-11-26 DIAGNOSIS — R278 Other lack of coordination: Secondary | ICD-10-CM

## 2020-11-26 DIAGNOSIS — M6281 Muscle weakness (generalized): Secondary | ICD-10-CM

## 2020-11-26 DIAGNOSIS — R2689 Other abnormalities of gait and mobility: Secondary | ICD-10-CM

## 2020-11-26 NOTE — Therapy (Signed)
[place] MAIN [facility] SERVICES [address] [place], [place], [address] Phone: [phone]   Fax:  [phone]  Physical Therapy Treatment  Patient Details  Name: Cory [name] MRN: [id number] Date of Birth: Oct 21, 1955 Referring Provider (PT): Benay Spice   Encounter Date: 11/26/2020   PT End of Session - 11/26/20 1522     Visit Number 18    Number of Visits 24    Date for PT Re-Evaluation 12/11/20    Authorization Type [employer] P[employer]    Authorization Time Period 09/18/20-12/11/20    PT Start Time 1516    PT Stop Time 1559    PT Time Calculation (min) 43 min    Equipment Utilized During Treatment Gait belt    Activity Tolerance Patient tolerated treatment well    Behavior During Therapy WFL for tasks assessed/performed             Past Medical History:  Diagnosis Date   Arthritis    hips and back   Asthma    as a baby-    GERD (gastroesophageal reflux disease)    rare -    Parkinson's disease ([place])    Pneumonia    age 18   Rash    legs- on going    Past Surgical History:  Procedure Laterality Date   APPENDECTOMY     BACK SURGERY     posterior lumbar spine fusion L4-5   COLONOSCOPY     COLONOSCOPY WITH PROPOFOL N/A 06/06/2017   Procedure: COLONOSCOPY WITH PROPOFOL;  Surgeon: Manya Silvas, MD;  Location: [facility] ENDOSCOPY;  Service: Endoscopy;  Laterality: N/A;   KNEE ARTHROSCOPY Bilateral    ACL   KNEE ARTHROSCOPY W/ ACL RECONSTRUCTION     RADIAL KERATOTOMY     ROTATOR CUFF REPAIR Bilateral    SHOULDER ARTHROSCOPY WITH BICEPS TENDON REPAIR Left 04/16/2019   Procedure: SHOULDER ARTHROSCOPY WITH BICEPS TENDON REPAIR, MINI OPEN SUPERIOR CAPSULAR RECONSTRUCTION, BICEPS TENODESIS;  Surgeon: Leim Fabry, MD;  Location: [facility] ORS;  Service: Orthopedics;  Laterality: Left;    There were no vitals filed for this visit.   Subjective Assessment - 11/26/20 1521     Subjective Patient reports a mild headache, had a  headache this weekend. Is having another bad day today.    Pertinent History Patient admitted to hospital 05/23/20 with code stroke and R sided weakness. Imaging negative for acute changes. PMH includes Parkinson's with autonomic instability, asthma, GERD depression, degenerative spondylolisthesis, focal seizures and migraine syndrome. Patient had three ED visits for falls since then. Patient reports when he has the seizures he has pain in his temples and bright flashes in his vision. Last week had three falls in one day.    Currently in Pain? Yes    Pain Score 1     Pain Location Head    Pain Orientation Left    Pain Descriptors / Indicators Aching;Sore    Pain Type Chronic pain               Patient reports a mild headache, had a headache this weekend. Is having another bad day today.    Treat:   Neuro Re-ed  Standing with CGA next to support surface:  Airex pad: one foot on 6" step one foot on airex pad, hold position for 30 seconds, switch legs, 2x each LE; pad  airex pad 6" step lateral step up/down with pads sandwhiching step; 10x each side ; finger tip support decreased  to no UE support  Step over half foam roller and clap 10x each LE; L foot external rotation improved with cueing.   Half foam roller flat side up modified tandem stance 2x 30 second holds      TherEx:  Nustep Lvl 5 seat position 8; 4 minutes for cardiovascular challenge/musculoskeletal  GTB hip flexion across // bars 15; cues for keeping head in neutral position.  GTB around ankles: monster walks forward/backwards 6x length of // bars; BUE support  Toy soldiers 6x length of // bars BUE support  Sit to stand with jump at top of stand 10x  Posterior lunges 10x each LE  occasional LOB requiring cues for sequencing and upright posture ; SUE support Lateral squat to narrow base squat 10x each side ; BUE support      Pt educated throughout session about proper posture and technique with exercises.  Improved exercise technique, movement at target joints, use of target muscles after min to mod verbal, visual, tactile cues      Patient tolerated session well despite it not being a " good" day.  Patient requires cueing for velocity of interventions due to preference for quick movements rather than controlled slow movements.  Require cueing for postural alignment required with additional use of visual cue from near.  Remains highly motivated and demonstrates excellent prognosis at this timePatient will benefit from skilled physical therapy to improve balance, gait mechanics, and overall functional mobility  Note: Portions of this document were prepared using Dragon voice recognition software and although reviewed may contain unintentional dictation errors in syntax, grammar, or spelling.                  PT Education - 11/26/20 1522     Education Details exercise technique, body mechanics    Person(s) Educated Patient    Methods Explanation;Demonstration;Tactile cues;Verbal cues    Comprehension Verbalized understanding;Returned demonstration;Verbal cues required;Tactile cues required              PT Short Term Goals - 10/21/20 0903       PT SHORT TERM GOAL #1   Title Patient will be independent in home exercise program to improve strength/mobility for better functional independence with ADLs.    Baseline 6/16: HEP to be given next session 7/19: HEP compliant    Time 4    Period Weeks    Status Achieved    Target Date 10/16/20               PT Long Term Goals - 10/21/20 1235       PT LONG TERM GOAL #1   Title Patient will increase FOTO score to equal to or greater than 51%    to demonstrate statistically significant improvement in mobility and quality of life.    Baseline 6/16: 48% 7/19: 57%    Time 12    Period Weeks    Status Achieved      PT LONG TERM GOAL #2   Title Patient will increase Berg Balance score by > 6 points (34/56)  to demonstrate  decreased fall risk during functional activities.    Baseline 6/16: 28/56 7/19: 45/56    Time 12    Period Weeks    Status Achieved      PT LONG TERM GOAL #3   Title Patient will deny any falls over past 4 weeks to demonstrate improved safety awareness at home and work.    Baseline 6/16: multiple falls 7/19: no falls reported  Time 12    Period Weeks    Status Achieved      PT LONG TERM GOAL #4   Title Patient will increase six minute walk test distance to >1300 for progression to age norm community ambulator and improve gait ability    Baseline 6/16: 945 ft with multiple near falls 7/19: 1495 ft    Time 12    Period Weeks    Status Achieved      PT LONG TERM GOAL #5   Title Patient will increase Berg Balance score by > 6 points (51/56)  to demonstrate decreased fall risk during functional activities.    Baseline 7/19: 45/56    Time 12    Period Weeks    Status New    Target Date 12/11/20      Additional Long Term Goals   Additional Long Term Goals Yes      PT LONG TERM GOAL #6   Title Patient will increase Functional Gait Assessment score to >20/30 as to reduce fall risk and improve dynamic gait safety with community ambulation.    Baseline 7/19: 16/30    Time 12    Period Weeks    Status New    Target Date 12/11/20                   Plan - 11/27/20 0720     Clinical Impression Statement Patient tolerated session well despite it not being a " good" day.  Patient requires cueing for velocity of interventions due to preference for quick movements rather than controlled slow movements.  Require cueing for postural alignment required with additional use of visual cue from near.  Remains highly motivated and demonstrates excellent prognosis at this timePatient will benefit from skilled physical therapy to improve balance, gait mechanics, and overall functional mobility    Personal Factors and Comorbidities Age;Comorbidity 3+;Fitness;Past/Current Experience;Time  since onset of injury/illness/exacerbation;Transportation    Comorbidities Parkinson's with autonomic instability, asthma, GERD depression, degenerative spondylolisthesis, focal seizures and migraine syndrome    Examination-Activity Limitations Bathing;Bed Mobility;Bend;Caring for [employer];Locomotion Level;Lift;Hygiene/Grooming;Squat;Stairs;Stand;Toileting;Transfers    Examination-Participation Restrictions Church;Cleaning;Community Activity;Driving;Meal Prep;Medication Management;Laundry;Shop;Volunteer;Yard Work    Stability/Clinical Decision Making Unstable/Unpredictable    Rehab Potential Fair    PT Frequency 2x / week    PT Duration 12 weeks    PT Treatment/Interventions ADLs/Self Care Home Management;Biofeedback;Canalith Repostioning;Cryotherapy;Parrafin;Ultrasound;Traction;Moist Heat;Iontophoresis 7m/ml Dexamethasone;Electrical Stimulation;DME Instruction;Gait training;Stair training;Functional mobility training;Neuromuscular re-education;Balance training;Therapeutic exercise;Therapeutic activities;Patient/family education;Orthotic Fit/Training;Manual techniques;Passive range of motion;Manual lymph drainage;Energy conservation;Splinting;Taping;Vasopneumatic Device;Vestibular;Joint Manipulations;Visual/perceptual remediation/compensation    PT Next Visit Plan tai chi, high level balance    PT Home Exercise Plan to give next session    Consulted and Agree with Plan of Care Patient;Family member/caregiver    Family Member Consulted wife             Patient will benefit from skilled therapeutic intervention in order to improve the following deficits and impairments:  Abnormal gait, Decreased activity tolerance, Decreased balance, Decreased knowledge of precautions, Decreased endurance, Decreased coordination, Decreased cognition, Decreased knowledge of use of DME, Decreased mobility, Decreased safety awareness, Difficulty walking, Decreased strength, Impaired  flexibility, Impaired perceived functional ability, Impaired sensation, Impaired tone, Impaired UE functional use, Impaired vision/preception, Improper body mechanics, Postural dysfunction  Visit Diagnosis: Muscle weakness (generalized)  Other lack of coordination  Other abnormalities of gait and mobility     Problem List Patient Active Problem List   Diagnosis Date Noted   [place]   [place]'s disease ([place]  History of migraine headaches    Migraine syndrome    BPH (benign prostatic hyperplasia)    Depression    Degenerative spondylolisthesis 05/07/2016    Janna Arch, PT, DPT  11/27/2020, 7:21 AM  [place] MAIN [facility] SERVICES [address] [place], [place], [address] Phone: [phone]   Fax:  [phone]  Name: ROMANO STIGGER MRN: [id number] Date of Birth: 02-04-1956

## 2020-11-27 NOTE — Therapy (Signed)
[place] MAIN [facility] SERVICES [address] [place], [place], [address] Phone: [phone]   Fax:  [phone]  Occupational Therapy Treatment  Patient Details  Name: Cory [name] MRN: [id number] Date of Birth: 1956-03-04 No data recorded  Encounter Date: 11/26/2020   OT End of Session - 11/27/20 0807     Visit Number 12    Number of Visits 24    Date for OT Re-Evaluation 12/22/20    Authorization Time Period Reporting period 09/30/2020-11/19/2020    OT Start Time 1430    OT Stop Time 1515    OT Time Calculation (min) 45 min    Equipment Utilized During Treatment 4 wheeled walker    Activity Tolerance Patient tolerated treatment well    Behavior During Therapy WFL for tasks assessed/performed             Past Medical History:  Diagnosis Date   Arthritis    hips and back   Asthma    as a baby-    GERD (gastroesophageal reflux disease)    rare -    Parkinson's disease ([place])    Pneumonia    age 65   Rash    legs- on going    Past Surgical History:  Procedure Laterality Date   APPENDECTOMY     BACK SURGERY     posterior lumbar spine fusion L4-5   COLONOSCOPY     COLONOSCOPY WITH PROPOFOL N/A 06/06/2017   Procedure: COLONOSCOPY WITH PROPOFOL;  Surgeon: Manya Silvas, MD;  Location: [facility] ENDOSCOPY;  Service: Endoscopy;  Laterality: N/A;   KNEE ARTHROSCOPY Bilateral    ACL   KNEE ARTHROSCOPY W/ ACL RECONSTRUCTION     RADIAL KERATOTOMY     ROTATOR CUFF REPAIR Bilateral    SHOULDER ARTHROSCOPY WITH BICEPS TENDON REPAIR Left 04/16/2019   Procedure: SHOULDER ARTHROSCOPY WITH BICEPS TENDON REPAIR, MINI OPEN SUPERIOR CAPSULAR RECONSTRUCTION, BICEPS TENODESIS;  Surgeon: Leim Fabry, MD;  Location: [facility] ORS;  Service: Orthopedics;  Laterality: Left;    There were no vitals filed for this visit.   Subjective Assessment - 11/26/20 0805     Subjective  "I forgot to have him practice with that spoon." (per spouse, referring to  swivel spoon)    Patient is accompanied by: Family member    Pertinent History Parkinson's Disease, R sided weakness, falls    Limitations balance, strength, coordination, visual perception, problem solving, processing information    Patient Stated Goals "I want to get stronger.  It frustrates me when I have to ask for help with things like cutting my food or buttoning a button."    Currently in Pain? No/denies    Pain Score 0-No pain    Pain Onset More than a month ago            Occupational Therapy Treatment: Self Care: Participation in self feeding trials to assess best utensil option for difficult foods.  Pt verbalizes cereal is one of his difficult foods which causes more spilling/dropped food.  Trialed swivel spook, built up spoon, and plastic silverware.  Pt reported that he preferred the plastic silverware because it was lighter.  Pt stated he had tried the weighted spoons prior and felt they were too heavy.  Pt tends to tilt spoon, however, sometimes causing spill.  OT educated that swivel spoon should negate this, but pt still preferred the plastic.  OT encouraged pt utilize deeper plastic soup spoon to reduce spilling.  Pt agreed and stated  that he has a tephlon camping spoon at home which is similar in weight to the plastic utensil he used today.  OT also encouraged plate beneath bowl to catch dropped food.  OT encouraged trial of swivel spoon on a day where tremor is more pronounced to see if he may prefer this option for other difficult foods such as scrambled eggs or macaroni.    Therapeutic Exercise: Participation in UBE standing x5 min with minimal resistance.  1.5 dowel exercises for chest press, shoulder flex, ER, and abduction x 2 sets 10 reps each, CGA/min A to maintain body mechanics and reduce compensatory leaning.    Response to Treatment: Pt eager to try more plastic silver options at home as he found the light weight worked better for him to self feed cereal.  Pt  continues to tolerated UB exercise well but bilat shoulders remain weak and pt fatigues quickly with repetition.  Pt will continue to benefit from skilled OT for increasing strength for ADLs and providing education on ADL strategies to maximize indep and efficiency with self care.     OT Education - 11/27/20 0807     Education Details self feeding strategies    Person(s) Educated Patient;Spouse    Methods Explanation;Verbal cues;Demonstration    Comprehension Verbalized understanding;Returned demonstration              OT Short Term Goals - 11/20/20 1054       OT SHORT TERM GOAL #1   Title Pt will perform BUE HEP with min vc    Baseline Eval: not yet initiated; 11/19/2020: Pt able to perform with min vc    Time 6    Period Weeks    Status Achieved    Target Date 11/10/20      OT SHORT TERM GOAL #2   Title Pt will report RPE of 4 or better to indicate improved tolerance to basic ADLs.    Baseline Eval: RPE of 6 with basic ADLs; 11/19/2020: RPE of 5 with basic ADLs    Time 6    Period Weeks    Status On-going    Target Date 11/10/20      OT SHORT TERM GOAL #3   Title Pt will utilize visual compensation strategies with min vc in order to improve basic and IADL performance.    Baseline Eval: pt reports some diplopia and decreased depth perception which limits ADL/IADL performance; Pt has been instructed in diplopia exercises and requires mod vc for compensation strategies when diplopia is present    Time 6    Period Weeks    Status On-going               OT Long Term Goals - 11/20/20 1058       OT LONG TERM GOAL #1   Title Pt will increase bilat shoulder strength by 1MM grade in order to improve tolerance to UB ADLs/IADLs and reaching activities.    Baseline Eval: R/L shoulder flex/abd 4-/5, R/L shoulder ER 3+/5; stamina is improving but strength grades still same as eval    Time 12    Period Weeks    Status On-going      OT LONG TERM GOAL #2   Title Pt will  manage clothing fasteners and looping belt with modified indep.    Baseline Eval: spouse assists.  Difficulty with tying shoes, looping belt, snapping and buttoning buttons; Using compensatory strategies for shoe tying as pt now has elastic shoe laces.  Pt now  managing his own belt with slight extra time.  Increased time to fasten buttons.    Time 12    Period Weeks    Status On-going    Target Date 12/22/20      OT LONG TERM GOAL #3   Title Pt will cut food with modified indep.    Baseline Eval: spouse assists to cut food; Indep to cut food.    Time 12    Period Weeks    Status Achieved    Target Date 12/22/20      OT LONG TERM GOAL #4   Title Pt will brush hair with R dominant arm with modified indep.    Baseline Eval: Pt states he must hold R arm up with L hand, and often spouse must assist; 11/19/2020: Pt can reach to top and back of head with RUE but arm fatigues quickly with repetition required to brush hair.    Time 12    Period Weeks    Status On-going    Target Date 12/22/20                   Plan - 11/26/20 0825     Clinical Impression Statement Pt eager to try more plastic silver options at home as he found the light weight worked better for him to self feed cereal.  Pt continues to tolerated UB exercise well but bilat shoulders remain weak and pt fatigues quickly with repetition.  Pt will continue to benefit from skilled OT for increasing strength for ADLs and providing education on ADL strategies to maximize indep and efficiency with self care.    OT Occupational Profile and History Detailed Assessment- Review of Records and additional review of physical, cognitive, psychosocial history related to current functional performance    Occupational performance deficits (Please refer to evaluation for details): ADL's;IADL's;Leisure    Body Structure / Function / Physical Skills ADL;Coordination;Endurance;GMC;UE functional  use;Balance;Sensation;IADL;Vision;Dexterity;FMC;Strength;Continence;Gait;Mobility    Rehab Potential Good    Clinical Decision Making Several treatment options, min-mod task modification necessary    Comorbidities Affecting Occupational Performance: May have comorbidities impacting occupational performance    Modification or Assistance to Complete Evaluation  Min-Moderate modification of tasks or assist with assess necessary to complete eval    OT Frequency 2x / week    OT Duration 12 weeks    OT Treatment/Interventions Self-care/ADL training;Therapeutic exercise;DME and/or AE instruction;Balance training;Neuromuscular education;Manual Therapy;Visual/perceptual remediation/compensation;Therapeutic activities;Patient/family education    Consulted and Agree with Plan of Care Patient             Patient will benefit from skilled therapeutic intervention in order to improve the following deficits and impairments:   Body Structure / Function / Physical Skills: ADL, Coordination, Endurance, GMC, UE functional use, Balance, Sensation, IADL, Vision, Dexterity, FMC, Strength, Continence, Gait, Mobility       Visit Diagnosis: Muscle weakness (generalized)  Other lack of coordination    Problem List Patient Active Problem List   Diagnosis Date Noted   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

## 2020-12-01 ENCOUNTER — Other Ambulatory Visit: Payer: Self-pay

## 2020-12-01 ENCOUNTER — Ambulatory Visit: Payer: Medicare PPO

## 2020-12-01 DIAGNOSIS — M6281 Muscle weakness (generalized): Secondary | ICD-10-CM

## 2020-12-01 DIAGNOSIS — R2681 Unsteadiness on feet: Secondary | ICD-10-CM

## 2020-12-01 DIAGNOSIS — R2689 Other abnormalities of gait and mobility: Secondary | ICD-10-CM

## 2020-12-01 DIAGNOSIS — R278 Other lack of coordination: Secondary | ICD-10-CM

## 2020-12-01 NOTE — Therapy (Signed)
[place] MAIN [facility] SERVICES [address] [place], [place], [address] Phone: [phone]   Fax:  [phone]  Physical Therapy Treatment  Patient Details  Name: Cory [name] MRN: [id number] Date of Birth: 27-Feb-1956 Referring Provider (PT): Benay Spice   Encounter Date: 12/01/2020   PT End of Session - 12/01/20 1105     Visit Number 19    Number of Visits 24    Date for PT Re-Evaluation 12/11/20    Authorization Type [employer] P[employer]    Authorization Time Period 09/18/20-12/11/20    PT Start Time 1100    PT Stop Time 1144    PT Time Calculation (min) 44 min    Equipment Utilized During Treatment Gait belt    Activity Tolerance Patient tolerated treatment well    Behavior During Therapy WFL for tasks assessed/performed             Past Medical History:  Diagnosis Date   Arthritis    hips and back   Asthma    as a baby-    GERD (gastroesophageal reflux disease)    rare -    Parkinson's disease ([place])    Pneumonia    age 52   Rash    legs- on going    Past Surgical History:  Procedure Laterality Date   APPENDECTOMY     BACK SURGERY     posterior lumbar spine fusion L4-5   COLONOSCOPY     COLONOSCOPY WITH PROPOFOL N/A 06/06/2017   Procedure: COLONOSCOPY WITH PROPOFOL;  Surgeon: Manya Silvas, MD;  Location: [facility] ENDOSCOPY;  Service: Endoscopy;  Laterality: N/A;   KNEE ARTHROSCOPY Bilateral    ACL   KNEE ARTHROSCOPY W/ ACL RECONSTRUCTION     RADIAL KERATOTOMY     ROTATOR CUFF REPAIR Bilateral    SHOULDER ARTHROSCOPY WITH BICEPS TENDON REPAIR Left 04/16/2019   Procedure: SHOULDER ARTHROSCOPY WITH BICEPS TENDON REPAIR, MINI OPEN SUPERIOR CAPSULAR RECONSTRUCTION, BICEPS TENODESIS;  Surgeon: Leim Fabry, MD;  Location: [facility] ORS;  Service: Orthopedics;  Laterality: Left;    There were no vitals filed for this visit.   Subjective Assessment - 12/01/20 1104     Subjective Patient has had a dull headache for  the past week, has been taking aleve during the day, only bothers him at night and in the morning.    Pertinent History Patient admitted to hospital 05/23/20 with code stroke and R sided weakness. Imaging negative for acute changes. PMH includes Parkinson's with autonomic instability, asthma, GERD depression, degenerative spondylolisthesis, focal seizures and migraine syndrome. Patient had three ED visits for falls since then. Patient reports when he has the seizures he has pain in his temples and bright flashes in his vision. Last week had three falls in one day.    Limitations Lifting;Standing;Walking;House hold activities    How long can you sit comfortably? sleeps in easy chair.    How long can you stand comfortably? feel unsteady once in standing.    How long can you walk comfortably? difficult to walk during "off period"    Patient Stated Goals stability, have a device that will be most helpful, help to become and stay independent.    Currently in Pain? No/denies                     Treat:   Neuro Re-ed   Standing with CGA next to support surface:  Airex pad: one foot on 6" step one foot  on airex pad, hold position for 30 seconds, switch legs, 2x each LE; pad  airex pad 6" step lateral step up/down with pads sandwiching step; 10x each side ; finger tip support decreased to no UE support   In hallway:  -ambulate 200 ft x 2 trials with dual task of naming animals in alphabetical order; increased shuffle steppage with difficulty of task -initiation/termination of ambulation 160 ft with PT cueing for stop/start. Challenge with first step   Cones: -weaving between 5 cones with Cga, cue for decreased velocity and increased step length; x4 trials -forward/backward weaving 5 cones; x4 trials  -lateral step; cone tap 5 cones x 2 trials       TherEx:  Nustep Lvl 5 seat position 8; 4 minutes for cardiovascular challenge/musculoskeletal  6" step toe taps 60 seconds cue for height  of hip position  GTB hip flexion from lunge position  across // bars 10x each LE; cues for keeping head in neutral position.  Walking lunges 4x length of // bars   TRX squat with chair behind; cue for keeping hips extended 12x TRX squat to jump 10x      Pt educated throughout session about proper posture and technique with exercises. Improved exercise technique, movement at target joints, use of target muscles after min to mod verbal, visual, tactile cues  Patient remains highly motivated throughout physical therapy session. He is challenged with maintaining large steps with initiation of movement/intentional movement initially but improved with repetition. High velocity toe taps resulted in decreased amplitude of step with repetition. Patient will benefit from skilled physical therapy to improve balance, gait mechanics, and overall functional mobility                   PT Education - 12/01/20 1105     Education Details exercise technique, body mechanics    Person(s) Educated Patient    Methods Explanation;Tactile cues;Demonstration;Verbal cues    Comprehension Verbalized understanding;Returned demonstration;Verbal cues required;Tactile cues required              PT Short Term Goals - 10/21/20 0903       PT SHORT TERM GOAL #1   Title Patient will be independent in home exercise program to improve strength/mobility for better functional independence with ADLs.    Baseline 6/16: HEP to be given next session 7/19: HEP compliant    Time 4    Period Weeks    Status Achieved    Target Date 10/16/20               PT Long Term Goals - 10/21/20 1235       PT LONG TERM GOAL #1   Title Patient will increase FOTO score to equal to or greater than 51%    to demonstrate statistically significant improvement in mobility and quality of life.    Baseline 6/16: 48% 7/19: 57%    Time 12    Period Weeks    Status Achieved      PT LONG TERM GOAL #2   Title Patient  will increase Berg Balance score by > 6 points (34/56)  to demonstrate decreased fall risk during functional activities.    Baseline 6/16: 28/56 7/19: 45/56    Time 12    Period Weeks    Status Achieved      PT LONG TERM GOAL #3   Title Patient will deny any falls over past 4 weeks to demonstrate improved safety awareness at home and work.  Baseline 6/16: multiple falls 7/19: no falls reported    Time 12    Period Weeks    Status Achieved      PT LONG TERM GOAL #4   Title Patient will increase six minute walk test distance to >1300 for progression to age norm community ambulator and improve gait ability    Baseline 6/16: 945 ft with multiple near falls 7/19: 1495 ft    Time 12    Period Weeks    Status Achieved      PT LONG TERM GOAL #5   Title Patient will increase Berg Balance score by > 6 points (51/56)  to demonstrate decreased fall risk during functional activities.    Baseline 7/19: 45/56    Time 12    Period Weeks    Status New    Target Date 12/11/20      Additional Long Term Goals   Additional Long Term Goals Yes      PT LONG TERM GOAL #6   Title Patient will increase Functional Gait Assessment score to >20/30 as to reduce fall risk and improve dynamic gait safety with community ambulation.    Baseline 7/19: 16/30    Time 12    Period Weeks    Status New    Target Date 12/11/20                   Plan - 12/01/20 1222     Clinical Impression Statement Patient remains highly motivated throughout physical therapy session. He is challenged with maintaining large steps with initiation of movement/intentional movement initially but improved with repetition. High velocity toe taps resulted in decreased amplitude of step with repetition. Patient will benefit from skilled physical therapy to improve balance, gait mechanics, and overall functional mobility    Personal Factors and Comorbidities Age;Comorbidity 3+;Fitness;Past/Current Experience;Time since onset of  injury/illness/exacerbation;Transportation    Comorbidities Parkinson's with autonomic instability, asthma, GERD depression, degenerative spondylolisthesis, focal seizures and migraine syndrome    Examination-Activity Limitations Bathing;Bed Mobility;Bend;Caring for [employer];Locomotion Level;Lift;Hygiene/Grooming;Squat;Stairs;Stand;Toileting;Transfers    Examination-Participation Restrictions Church;Cleaning;Community Activity;Driving;Meal Prep;Medication Management;Laundry;Shop;Volunteer;Yard Work    Stability/Clinical Decision Making Unstable/Unpredictable    Rehab Potential Fair    PT Frequency 2x / week    PT Duration 12 weeks    PT Treatment/Interventions ADLs/Self Care Home Management;Biofeedback;Canalith Repostioning;Cryotherapy;Parrafin;Ultrasound;Traction;Moist Heat;Iontophoresis 49m/ml Dexamethasone;Electrical Stimulation;DME Instruction;Gait training;Stair training;Functional mobility training;Neuromuscular re-education;Balance training;Therapeutic exercise;Therapeutic activities;Patient/family education;Orthotic Fit/Training;Manual techniques;Passive range of motion;Manual lymph drainage;Energy conservation;Splinting;Taping;Vasopneumatic Device;Vestibular;Joint Manipulations;Visual/perceptual remediation/compensation    PT Next Visit Plan tai chi, high level balance    PT Home Exercise Plan to give next session    Consulted and Agree with Plan of Care Patient;Family member/caregiver    Family Member Consulted wife             Patient will benefit from skilled therapeutic intervention in order to improve the following deficits and impairments:  Abnormal gait, Decreased activity tolerance, Decreased balance, Decreased knowledge of precautions, Decreased endurance, Decreased coordination, Decreased cognition, Decreased knowledge of use of DME, Decreased mobility, Decreased safety awareness, Difficulty walking, Decreased strength, Impaired flexibility, Impaired  perceived functional ability, Impaired sensation, Impaired tone, Impaired UE functional use, Impaired vision/preception, Improper body mechanics, Postural dysfunction  Visit Diagnosis: Muscle weakness (generalized)  Other abnormalities of gait and mobility  Unsteadiness on feet     Problem List Patient Active Problem List   Diagnosis Date Noted   [place]ocal seizure ([place] 05/24/2020   [place]'s [place]    History of migraine headaches  Migraine syndrome    BPH (benign prostatic hyperplasia)    Depression    Degenerative spondylolisthesis 05/07/2016    Janna Arch, PT, DPT  12/01/2020, 12:23 PM  [place] MAIN [facility] SERVICES [address] [place], [place], [address] Phone: [phone]   Fax:  [phone]  Name: Cory [name] MRN: [id number] Date of Birth: Dec 07, 1955

## 2020-12-01 NOTE — Therapy (Signed)
Cory [name] [facility] SERVICES [address] [place], [place], [address] Phone: [phone]   Fax:  [phone]  Occupational Therapy Treatment  Patient Details  Name: Cory [name] MRN: [id number] Date of Birth: Nov 04, 1955 No data recorded  Encounter Date: 12/01/2020   OT End of Session - 12/01/20 1241     Visit Number 13    Number of Visits 24    Date for OT Re-Evaluation 12/22/20    Authorization Time Period Reporting period 09/30/2020-11/19/2020    OT Start Time 1145    OT Stop Time 106    OT Time Calculation (min) 45 min    Equipment Utilized During Treatment 4 wheeled walker    Activity Tolerance Patient tolerated treatment well    Behavior During Therapy WFL for tasks assessed/performed             Past Medical History:  Diagnosis Date   Arthritis    hips and back   Asthma    as a baby-    GERD (gastroesophageal reflux disease)    rare -    Parkinson's disease ([place])    Pneumonia    age 65   Rash    legs- on going    Past Surgical History:  Procedure Laterality Date   APPENDECTOMY     BACK SURGERY     posterior lumbar spine fusion L4-5   COLONOSCOPY     COLONOSCOPY WITH PROPOFOL N/A 06/06/2017   Procedure: COLONOSCOPY WITH PROPOFOL;  Surgeon: Manya Silvas, MD;  Location: [facility] ENDOSCOPY;  Service: Endoscopy;  Laterality: N/A;   KNEE ARTHROSCOPY Bilateral    ACL   KNEE ARTHROSCOPY W/ ACL RECONSTRUCTION     RADIAL KERATOTOMY     ROTATOR CUFF REPAIR Bilateral    SHOULDER ARTHROSCOPY WITH BICEPS TENDON REPAIR Left 04/16/2019   Procedure: SHOULDER ARTHROSCOPY WITH BICEPS TENDON REPAIR, MINI OPEN SUPERIOR CAPSULAR RECONSTRUCTION, BICEPS TENODESIS;  Surgeon: Leim Fabry, MD;  Location: [facility] ORS;  Service: Orthopedics;  Laterality: Left;    There were no vitals filed for this visit.   Subjective Assessment - 12/01/20 1238     Subjective  "I used my plastic camping silverware and they worked really well."     Pertinent History Parkinson's Disease, R sided weakness, falls    Limitations balance, strength, coordination, visual perception, problem solving, processing information    Patient Stated Goals "I want to get stronger.  It frustrates me when I have to ask for help with things like cutting my food or buttoning a button."    Currently in Pain? Yes    Pain Score 4     Pain Location Shoulder    Pain Orientation Left    Pain Descriptors / Indicators Aching;Dull    Pain Type Chronic pain    Pain Onset More than a month ago    Pain Frequency Intermittent    Aggravating Factors  raising arm up past shoulder level    Multiple Pain Sites No            Occupational Therapy Treatment: Therapeutic Exercise: Participation in UBE in standing 5 min forward at moderate resistance, 5 min reverse no resistance with good tolerance.  Rest between sets.  Used 1 lb wrist weight to perform R shoulder forward flex/abd to 90 degrees, ER/IR x 3 sets 10 reps each.  Simulated combing hair with RUE with reach to top/back/side of head with 1 lb wrist weight donned.  Performed bilat chest press and shoulder  flex with 1.5 lb dowel, intermittent tactile cues to maintain good body mechanics.  Instructed pt in self passive stretching for bilat ER with lean forward, elbow bent to 90 degrees, and humerus abducted on table top with good return demo.  Reinforced to perform slow stretch and hold for 20 seconds.   Response to Treatment: Pt reports he is using his R hand to comb hair, but occasionally needs to support the R arm with the LUE.  Pt will continue to benefit from skilled OT for strengthening BUEs in order to improve functional reach overhead for self care.  OT introduced long handled comb but pt was not interested.  Pt reports he tried his plastic camping silverware for self feeding over the weekend and felt the light weight of these utensils made eating easier.  Will continue to reinforce ADL compensation strategies as  needed in order to maximize indep and ease performance of self care activities.     OT Education - 12/01/20 1241     Education Details HEP progression/shoulder stretches    Person(s) Educated Patient;Spouse    Methods Explanation;Verbal cues;Demonstration    Comprehension Verbalized understanding;Returned demonstration              OT Short Term Goals - 11/20/20 1054       OT SHORT TERM GOAL #1   Title Pt will perform BUE HEP with min vc    Baseline Eval: not yet initiated; 11/19/2020: Pt able to perform with min vc    Time 6    Period Weeks    Status Achieved    Target Date 11/10/20      OT SHORT TERM GOAL #2   Title Pt will report RPE of 4 or better to indicate improved tolerance to basic ADLs.    Baseline Eval: RPE of 6 with basic ADLs; 11/19/2020: RPE of 5 with basic ADLs    Time 6    Period Weeks    Status On-going    Target Date 11/10/20      OT SHORT TERM GOAL #3   Title Pt will utilize visual compensation strategies with min vc in order to improve basic and IADL performance.    Baseline Eval: pt reports some diplopia and decreased depth perception which limits ADL/IADL performance; Pt has been instructed in diplopia exercises and requires mod vc for compensation strategies when diplopia is present    Time 6    Period Weeks    Status On-going               OT Long Term Goals - 11/20/20 1058       OT LONG TERM GOAL #1   Title Pt will increase bilat shoulder strength by 1MM grade in order to improve tolerance to UB ADLs/IADLs and reaching activities.    Baseline Eval: R/L shoulder flex/abd 4-/5, R/L shoulder ER 3+/5; stamina is improving but strength grades still same as eval    Time 12    Period Weeks    Status On-going      OT LONG TERM GOAL #2   Title Pt will manage clothing fasteners and looping belt with modified indep.    Baseline Eval: spouse assists.  Difficulty with tying shoes, looping belt, snapping and buttoning buttons; Using compensatory  strategies for shoe tying as pt now has elastic shoe laces.  Pt now managing his own belt with slight extra time.  Increased time to fasten buttons.    Time 12    Period Weeks  Status On-going    Target Date 12/22/20      OT LONG TERM GOAL #3   Title Pt will cut food with modified indep.    Baseline Eval: spouse assists to cut food; Indep to cut food.    Time 12    Period Weeks    Status Achieved    Target Date 12/22/20      OT LONG TERM GOAL #4   Title Pt will brush hair with R dominant arm with modified indep.    Baseline Eval: Pt states he must hold R arm up with L hand, and often spouse must assist; 11/19/2020: Pt can reach to top and back of head with RUE but arm fatigues quickly with repetition required to brush hair.    Time 12    Period Weeks    Status On-going    Target Date 12/22/20                   Plan - 12/01/20 1254     Clinical Impression Statement Pt reports he is using his R hand to comb hair, but occasionally needs to support the R arm with the LUE.  Pt will continue to benefit from skilled OT for strengthening BUEs in order to improve functional reach overhead for self care.  OT introduced long handled comb but pt was not interested.  Pt reports he tried his plastic camping silverware for self feeding over the weekend and felt the light weight of these utensils made eating easier.  Will continue to reinforce ADL compensation strategies as needed in order to maximize indep and ease performance of self care activities.    OT Occupational Profile and History Detailed Assessment- Review of Records and additional review of physical, cognitive, psychosocial history related to current functional performance    Occupational performance deficits (Please refer to evaluation for details): ADL's;IADL's;Leisure    Body Structure / Function / Physical Skills ADL;Coordination;Endurance;GMC;UE functional  use;Balance;Sensation;IADL;Vision;Dexterity;FMC;Strength;Continence;Gait;Mobility    Rehab Potential Good    Clinical Decision Making Several treatment options, min-mod task modification necessary    Comorbidities Affecting Occupational Performance: May have comorbidities impacting occupational performance    Modification or Assistance to Complete Evaluation  Min-Moderate modification of tasks or assist with assess necessary to complete eval    OT Frequency 2x / week    OT Duration 12 weeks    OT Treatment/Interventions Self-care/ADL training;Therapeutic exercise;DME and/or AE instruction;Balance training;Neuromuscular education;Manual Therapy;Visual/perceptual remediation/compensation;Therapeutic activities;Patient/family education    Consulted and Agree with Plan of Care Patient             Patient will benefit from skilled therapeutic intervention in order to improve the following deficits and impairments:   Body Structure / Function / Physical Skills: ADL, Coordination, Endurance, GMC, UE functional use, Balance, Sensation, IADL, Vision, Dexterity, FMC, Strength, Continence, Gait, Mobility       Visit Diagnosis: Muscle weakness (generalized)  Other lack of coordination    Problem List Patient Active Problem List   Diagnosis Date Noted   Focal seizure ([place]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 [place], [place], [address] Phone: [phone]   Fax:  [phone]  Name: CHARBEL MCCLEAF MRN:  [id number] Date of Birth: 1955-05-28

## 2020-12-03 ENCOUNTER — Ambulatory Visit: Payer: Medicare PPO

## 2020-12-03 ENCOUNTER — Other Ambulatory Visit: Payer: Self-pay

## 2020-12-03 DIAGNOSIS — R278 Other lack of coordination: Secondary | ICD-10-CM

## 2020-12-03 DIAGNOSIS — M6281 Muscle weakness (generalized): Secondary | ICD-10-CM

## 2020-12-03 DIAGNOSIS — R2689 Other abnormalities of gait and mobility: Secondary | ICD-10-CM

## 2020-12-03 NOTE — Therapy (Signed)
[place] MAIN [facility] SERVICES [address] [place], [place], [address] Phone: [phone]   Fax:  [phone]  Physical Therapy Treatment Physical Therapy Progress Note   Dates of reporting period  10/21/20   to   12/03/20   Patient Details  Name: Cory [name] MRN: [id number] Date of Birth: [birth year] Referring Provider (PT): Benay Spice   Encounter Date: 12/03/2020   PT End of Session - 12/03/20 1352     Visit Number 20    Number of Visits 24    Date for PT Re-Evaluation 12/11/20    Authorization Type [employer] P[employer]    Authorization Time Period 09/18/20-12/11/20; next session 1/10 PN 8/31    PT Start Time 1100    PT Stop Time 1145    PT Time Calculation (min) 45 min    Equipment Utilized During Treatment Gait belt    Activity Tolerance Patient tolerated treatment well    Behavior During Therapy WFL for tasks assessed/performed             Past Medical History:  Diagnosis Date   Arthritis    hips and back   Asthma    as a baby-    GERD (gastroesophageal reflux disease)    rare -    Parkinson's disease ([place])    Pneumonia    age 65   Rash    legs- on going    Past Surgical History:  Procedure Laterality Date   APPENDECTOMY     BACK SURGERY     posterior lumbar spine fusion L4-5   COLONOSCOPY     COLONOSCOPY WITH PROPOFOL N/A 06/06/2017   Procedure: COLONOSCOPY WITH PROPOFOL;  Surgeon: Manya Silvas, MD;  Location: [facility] ENDOSCOPY;  Service: Endoscopy;  Laterality: N/A;   KNEE ARTHROSCOPY Bilateral    ACL   KNEE ARTHROSCOPY W/ ACL RECONSTRUCTION     RADIAL KERATOTOMY     ROTATOR CUFF REPAIR Bilateral    SHOULDER ARTHROSCOPY WITH BICEPS TENDON REPAIR Left 04/16/2019   Procedure: SHOULDER ARTHROSCOPY WITH BICEPS TENDON REPAIR, MINI OPEN SUPERIOR CAPSULAR RECONSTRUCTION, BICEPS TENODESIS;  Surgeon: Leim Fabry, MD;  Location: [facility] ORS;  Service: Orthopedics;  Laterality: Left;    There were no vitals filed  for this visit.   Subjective Assessment - 12/03/20 1243     Subjective patient reports he initially had a bad morning but has improved throughout the morning. Wants to walk his dog.    Pertinent History Patient admitted to hospital 05/23/20 with code stroke and R sided weakness. Imaging negative for acute changes. PMH includes Parkinson's with autonomic instability, asthma, GERD depression, degenerative spondylolisthesis, focal seizures and migraine syndrome. Patient had three ED visits for falls since then. Patient reports when he has the seizures he has pain in his temples and bright flashes in his vision. Last week had three falls in one day.    Limitations Lifting;Standing;Walking;House hold activities    How long can you sit comfortably? sleeps in easy chair.    How long can you stand comfortably? feel unsteady once in standing.    How long can you walk comfortably? difficult to walk during "off period"    Patient Stated Goals stability, have a device that will be most helpful, help to become and stay independent.    Currently in Pain? No/denies                [facility] PT Assessment - 12/03/20 0001  Standardized Balance Assessment   Standardized Balance Assessment Berg Balance Test      Berg Balance Test   Sit to Stand Able to stand without using hands and stabilize independently    Standing Unsupported Able to stand safely 2 minutes    Sitting with Back Unsupported but Feet Supported on Floor or Stool Able to sit safely and securely 2 minutes    Stand to Sit Sits safely with minimal use of hands    Transfers Able to transfer safely, minor use of hands    Standing Unsupported with Eyes Closed Able to stand 10 seconds with supervision    Standing Unsupported with Feet Together Able to place feet together independently and stand for 1 minute with supervision    From Standing, Reach Forward with Outstretched Arm Can reach forward >12 cm safely (5")    From Standing Position,  Pick up Object from [place] to pick up shoe safely and easily    From Standing Position, Turn to Look Behind Over each Shoulder Looks behind from both sides and weight shifts well    Turn 360 Degrees Able to turn 360 degrees safely one side only in 4 seconds or less    Standing Unsupported, Alternately Place Feet on Step/Stool Able to stand independently and complete 8 steps >20 seconds    Standing Unsupported, One Foot in Front Able to plae foot ahead of the other independently and hold 30 seconds    Standing on One Leg Able to lift leg independently and hold 5-10 seconds    Total Score 49      Functional Gait  Assessment   Gait assessed  Yes    Gait Level Surface Walks 20 ft in less than 5.5 sec, no assistive devices, good speed, no evidence for imbalance, normal gait pattern, deviates no more than 6 in outside of the 12 in walkway width.    Change in Gait Speed Able to smoothly change walking speed without loss of balance or gait deviation. Deviate no more than 6 in outside of the 12 in walkway width.    Gait with Horizontal Head Turns Performs head turns smoothly with slight change in gait velocity (eg, minor disruption to smooth gait path), deviates 6-10 in outside 12 in walkway width, or uses an assistive device.    Gait with Vertical Head Turns Performs task with slight change in gait velocity (eg, minor disruption to smooth gait path), deviates 6 - 10 in outside 12 in walkway width or uses assistive device    Gait and Pivot Turn Turns slowly, requires verbal cueing, or requires several small steps to catch balance following turn and stop    Step Over Obstacle Is able to step over one shoe box (4.5 in total height) without changing gait speed. No evidence of imbalance.    Gait with Narrow Base of Support Ambulates 4-7 steps.    Gait with Eyes Closed Walks 20 ft, uses assistive device, slower speed, mild gait deviations, deviates 6-10 in outside 12 in walkway width. Ambulates 20 ft in less  than 9 sec but greater than 7 sec.    Ambulating Backwards Walks 20 ft, uses assistive device, slower speed, mild gait deviations, deviates 6-10 in outside 12 in walkway width.    Steps Alternating feet, must use rail.    Total Score 20             Goals:  FOTO: 50%  BERG: 49/56  FGA: 20/30  Treatment:   Ambulate  in hallway: -pertubation provided by PT in all planes by GTB x 160 ft  ambulate across stable and unstable surface outside. Negotiating changing surfaces from grass to sidewalk, across brick with turns and obstacles in pathway without LOB. X 11 minutes   5lb ankle weights: marching 6x length of // bars forward/backwards no UE support   Patient's condition has the potential to improve in response to therapy. Maximum improvement is yet to be obtained. The anticipated improvement is attainable and reasonable in a generally predictable time.  Patient reports his balance has significantly improved but he is not yet where he would like to be.    Patient is improving in stability as can be seen in his BERG and FGA. Ambulation outside on all terrains without AD performed without LOB. Pertubation on flat surface to mimic walking dog tolerated. Patient's capacity for functional mobility continues to be limited at this time. Patient's condition has the potential to improve in response to therapy. Maximum improvement is yet to be obtained. The anticipated improvement is attainable and reasonable in a generally predictable time.               PT Education - 12/03/20 1244     Education Details goals, POC    Person(s) Educated Patient    Methods Explanation    Comprehension Verbalized understanding              PT Short Term Goals - 10/21/20 0903       PT SHORT TERM GOAL #1   Title Patient will be independent in home exercise program to improve strength/mobility for better functional independence with ADLs.    Baseline 6/16: HEP to be given next session 7/19: HEP  compliant    Time 4    Period Weeks    Status Achieved    Target Date 10/16/20               PT Long Term Goals - 12/03/20 1355       PT LONG TERM GOAL #1   Title Patient will increase FOTO score to equal to or greater than 51%    to demonstrate statistically significant improvement in mobility and quality of life.    Baseline 6/16: 48% 7/19: 57% 8/31: 50%    Time 12    Period Weeks    Status Achieved      PT LONG TERM GOAL #2   Title Patient will increase Berg Balance score by > 6 points (34/56)  to demonstrate decreased fall risk during functional activities.    Baseline 6/16: 28/56 7/19: 45/56    Time 12    Period Weeks    Status Achieved      PT LONG TERM GOAL #3   Title Patient will deny any falls over past 4 weeks to demonstrate improved safety awareness at home and work.    Baseline 6/16: multiple falls 7/19: no falls reported    Time 12    Period Weeks    Status Achieved      PT LONG TERM GOAL #4   Title Patient will increase six minute walk test distance to >1300 for progression to age norm community ambulator and improve gait ability    Baseline 6/16: 945 ft with multiple near falls 7/19: 1495 ft    Time 12    Period Weeks    Status Achieved      PT LONG TERM GOAL #5   Title Patient will increase Berg Balance score by >  6 points (51/56)  to demonstrate decreased fall risk during functional activities.    Baseline 7/19: 45/56 8/31: 49/56    Time 12    Period Weeks    Status Partially Met    Target Date 12/11/20      PT LONG TERM GOAL #6   Title Patient will increase Functional Gait Assessment score to >20/30 as to reduce fall risk and improve dynamic gait safety with community ambulation.    Baseline 7/19: 16/30 8/31: 20/30    Time 12    Period Weeks    Status Partially Met    Target Date 12/11/20                   Plan - 12/03/20 1355     Clinical Impression Statement Patient is improving in stability as can be seen in his BERG and  FGA. Ambulation outside on all terrains without AD performed without LOB. Pertubation on flat surface to mimic walking dog tolerated. Patient's capacity for functional mobility continues to be limited at this time. Patient's condition has the potential to improve in response to therapy. Maximum improvement is yet to be obtained. The anticipated improvement is attainable and reasonable in a generally predictable time. Patient will benefit from skilled physical therapy to improve balance, gait mechanics, and overall functional mobility    Personal Factors and Comorbidities Age;Comorbidity 3+;Fitness;Past/Current Experience;Time since onset of injury/illness/exacerbation;Transportation    Comorbidities Parkinson's with autonomic instability, asthma, GERD depression, degenerative spondylolisthesis, focal seizures and migraine syndrome    Examination-Activity Limitations Bathing;Bed Mobility;Bend;Caring for [employer];Locomotion Level;Lift;Hygiene/Grooming;Squat;Stairs;Stand;Toileting;Transfers    Examination-Participation Restrictions Church;Cleaning;Community Activity;Driving;Meal Prep;Medication Management;Laundry;Shop;Volunteer;Yard Work    Stability/Clinical Decision Making Unstable/Unpredictable    Rehab Potential Fair    PT Frequency 2x / week    PT Duration 12 weeks    PT Treatment/Interventions ADLs/Self Care Home Management;Biofeedback;Canalith Repostioning;Cryotherapy;Parrafin;Ultrasound;Traction;Moist Heat;Iontophoresis 73m/ml Dexamethasone;Electrical Stimulation;DME Instruction;Gait training;Stair training;Functional mobility training;Neuromuscular re-education;Balance training;Therapeutic exercise;Therapeutic activities;Patient/family education;Orthotic Fit/Training;Manual techniques;Passive range of motion;Manual lymph drainage;Energy conservation;Splinting;Taping;Vasopneumatic Device;Vestibular;Joint Manipulations;Visual/perceptual remediation/compensation    PT Next  Visit Plan tai chi, high level balance    PT Home Exercise Plan to give next session    Consulted and Agree with Plan of Care Patient;Family member/caregiver    Family Member Consulted wife             Patient will benefit from skilled therapeutic intervention in order to improve the following deficits and impairments:  Abnormal gait, Decreased activity tolerance, Decreased balance, Decreased knowledge of precautions, Decreased endurance, Decreased coordination, Decreased cognition, Decreased knowledge of use of DME, Decreased mobility, Decreased safety awareness, Difficulty walking, Decreased strength, Impaired flexibility, Impaired perceived functional ability, Impaired sensation, Impaired tone, Impaired UE functional use, Impaired vision/preception, Improper body mechanics, Postural dysfunction  Visit Diagnosis: Muscle weakness (generalized)  Other lack of coordination  Other abnormalities of gait and mobility     Problem List Patient Active Problem List   Diagnosis Date Noted   Focal seizure ([place] 05/24/2020   [place]'s disease [place]    History of migraine headaches    Migraine syndrome    BPH (benign prostatic hyperplasia)    Depression    Degenerative spondylolisthesis 05/07/2016   MJanna Arch PT, DPT  12/03/2020, 1:58 PM  [place] [facility] [address] [address]Phone: [phone]  Fax:  [phone] Name: Cory SAVASMRN: [id number] of Birth: [birth year]

## 2020-12-03 NOTE — Therapy (Signed)
[place] MAIN [facility] SERVICES [address] [place], [place], [address] Phone: [phone]   Fax:  [phone]  Occupational Therapy Treatment  Patient Details  Name: Cory [name] MRN: [id number] Date of Birth: 10/29/1955 No data recorded  Encounter Date: 12/03/2020   OT End of Session - 12/03/20 1411     Visit Number 14    Number of Visits 24    Date for OT Re-Evaluation 12/22/20    Authorization Time Period Reporting period 09/30/2020-11/19/2020    OT Start Time 1148    OT Stop Time 1232    OT Time Calculation (min) 44 min    Equipment Utilized During Treatment 4 wheeled walker    Activity Tolerance Patient tolerated treatment well    Behavior During Therapy WFL for tasks assessed/performed             Past Medical History:  Diagnosis Date   Arthritis    hips and back   Asthma    as a baby-    GERD (gastroesophageal reflux disease)    rare -    Parkinson's disease ([place])    Pneumonia    age 65   Rash    legs- on going    Past Surgical History:  Procedure Laterality Date   APPENDECTOMY     BACK SURGERY     posterior lumbar spine fusion L4-5   COLONOSCOPY     COLONOSCOPY WITH PROPOFOL N/A 06/06/2017   Procedure: COLONOSCOPY WITH PROPOFOL;  Surgeon: Manya Silvas, MD;  Location[facility];  Service: Endoscopy;  Laterality: N/A;   KNEE ARTHROSCOPY Bilateral    ACL   KNEE ARTHROSCOPY W/ ACL RECONSTRUCTION     RADIAL KERATOTOMY     ROTATOR CUFF REPAIR Bilateral    SHOULDER ARTHROSCOPY WITH BICEPS TENDON REPAIR Left 04/16/2019   Procedure: SHOULDER ARTHROSCOPY WITH BICEPS TENDON REPAIR, MINI OPEN SUPERIOR CAPSULAR RECONSTRUCTION, BICEPS TENODESIS;  Surgeon: Leim Fabry, MD;  Location: [facility] ORS;  Service: Orthopedics;  Laterality: Left;    There were no vitals filed for this visit.   Subjective Assessment - 12/03/20 1410     Subjective  "I've had a good couple of days."    Patient is accompanied by: Family  member    Pertinent History Parkinson's Disease, R sided weakness, falls    Limitations balance, strength, coordination, visual perception, problem solving, processing information    Patient Stated Goals "I want to get stronger.  It frustrates me when I have to ask for help with things like cutting my food or buttoning a button."    Currently in Pain? No/denies    Pain Score 0-No pain    Pain Onset More than a month ago           Occupational Therapy Treatment: Therapeutic Exercise: Performed passive stretching to bilat shoulders for flex/abd/ER, shoulder retraction.  Participation in UBE standing x5 min with moderate resistance.  Used 1.5 lb dowel for bilat chest press, shoulder flex/abd, ER to top of head, 1 set 10, then 2nd set of 5-7 reps, intermittent min A to maintain good body mechanics.  In standing, performed IR with 3 lb weight, passing weight between R/L hands behind back x2 sets 10 reps each.  Performed bicep curl R/L x2 sets 10 reps each with 4 lb weight, requiring mod tactile cues to maintain body mechanics.    Response to Treatment: See Plan/clinical impression below.   OT Education - 12/03/20 1410     Education  Details HEP progression/shoulder stretches    Person(s) Educated Patient;Spouse    Methods Explanation;Verbal cues;Demonstration    Comprehension Verbalized understanding;Returned demonstration              OT Short Term Goals - 11/20/20 1054       OT SHORT TERM GOAL #1   Title Pt will perform BUE HEP with min vc    Baseline Eval: not yet initiated; 11/19/2020: Pt able to perform with min vc    Time 6    Period Weeks    Status Achieved    Target Date 11/10/20      OT SHORT TERM GOAL #2   Title Pt will report RPE of 4 or better to indicate improved tolerance to basic ADLs.    Baseline Eval: RPE of 6 with basic ADLs; 11/19/2020: RPE of 5 with basic ADLs    Time 6    Period Weeks    Status On-going    Target Date 11/10/20      OT SHORT TERM GOAL #3    Title Pt will utilize visual compensation strategies with min vc in order to improve basic and IADL performance.    Baseline Eval: pt reports some diplopia and decreased depth perception which limits ADL/IADL performance; Pt has been instructed in diplopia exercises and requires mod vc for compensation strategies when diplopia is present    Time 6    Period Weeks    Status On-going               OT Long Term Goals - 11/20/20 1058       OT LONG TERM GOAL #1   Title Pt will increase bilat shoulder strength by 1MM grade in order to improve tolerance to UB ADLs/IADLs and reaching activities.    Baseline Eval: R/L shoulder flex/abd 4-/5, R/L shoulder ER 3+/5; stamina is improving but strength grades still same as eval    Time 12    Period Weeks    Status On-going      OT LONG TERM GOAL #2   Title Pt will manage clothing fasteners and looping belt with modified indep.    Baseline Eval: spouse assists.  Difficulty with tying shoes, looping belt, snapping and buttoning buttons; Using compensatory strategies for shoe tying as pt now has elastic shoe laces.  Pt now managing his own belt with slight extra time.  Increased time to fasten buttons.    Time 12    Period Weeks    Status On-going    Target Date 12/22/20      OT LONG TERM GOAL #3   Title Pt will cut food with modified indep.    Baseline Eval: spouse assists to cut food; Indep to cut food.    Time 12    Period Weeks    Status Achieved    Target Date 12/22/20      OT LONG TERM GOAL #4   Title Pt will brush hair with R dominant arm with modified indep.    Baseline Eval: Pt states he must hold R arm up with L hand, and often spouse must assist; 11/19/2020: Pt can reach to top and back of head with RUE but arm fatigues quickly with repetition required to brush hair.    Time 12    Period Weeks    Status On-going    Target Date 12/22/20                   Plan - 12/03/20  1418     Clinical Impression Statement Pt  making steady gains towards OT goals.  Pt reports he still has difficulty with reaching overhead to put cereal in the cupboard or hanging clothes in the closet.  Pt will benefit from continued Occupational Therapy to address BUE weakness and ADL modification as needed in order to maximize indep with ADLs/IADLs.    OT Occupational Profile and History Detailed Assessment- Review of Records and additional review of physical, cognitive, psychosocial history related to current functional performance    Occupational performance deficits (Please refer to evaluation for details): ADL's;IADL's;Leisure    Body Structure / Function / Physical Skills ADL;Coordination;Endurance;GMC;UE functional use;Balance;Sensation;IADL;Vision;Dexterity;FMC;Strength;Continence;Gait;Mobility    Rehab Potential Good    Clinical Decision Making Several treatment options, min-mod task modification necessary    Comorbidities Affecting Occupational Performance: May have comorbidities impacting occupational performance    Modification or Assistance to Complete Evaluation  Min-Moderate modification of tasks or assist with assess necessary to complete eval    OT Frequency 2x / week    OT Duration 12 weeks    OT Treatment/Interventions Self-care/ADL training;Therapeutic exercise;DME and/or AE instruction;Balance training;Neuromuscular education;Manual Therapy;Visual/perceptual remediation/compensation;Therapeutic activities;Patient/family education    Consulted and Agree with Plan of Care Patient             Patient will benefit from skilled therapeutic intervention in order to improve the following deficits and impairments:   Body Structure / Function / Physical Skills: ADL, Coordination, Endurance, GMC, UE functional use, Balance, Sensation, IADL, Vision, Dexterity, FMC, Strength, Continence, Gait, Mobility       Visit Diagnosis: Muscle weakness (generalized)  Other lack of coordination    Problem List Patient Active  Problem List   Diagnosis Date Noted   Focal seizure ([place]) 05/24/2020   Parkinson's disease ([place])    History of migraine headaches    Migraine syndrome    BPH (benign prostatic hyperplasia)    Depression    Degenerative spondylolisthesis 05/07/2016   Leta Speller, MS, OTR/L  Darleene Cleaver 12/03/2020, 2:18 PM  [place] MAIN [facility] SERVICES [address] [place], [place], [address] Phone: [phone]   Fax:  [phone]  Name: Cory [name] MRN: [id number] Date of Birth: November 15, 1955

## 2020-12-09 ENCOUNTER — Ambulatory Visit: Payer: Medicare PPO

## 2020-12-10 ENCOUNTER — Ambulatory Visit: Payer: Medicare PPO

## 2020-12-10 ENCOUNTER — Ambulatory Visit: Payer: Medicare PPO | Attending: Neurology

## 2020-12-10 ENCOUNTER — Other Ambulatory Visit: Payer: Self-pay

## 2020-12-10 DIAGNOSIS — G20 Parkinson's disease: Secondary | ICD-10-CM

## 2020-12-10 DIAGNOSIS — R2681 Unsteadiness on feet: Secondary | ICD-10-CM | POA: Insufficient documentation

## 2020-12-10 DIAGNOSIS — M6281 Muscle weakness (generalized): Secondary | ICD-10-CM | POA: Diagnosis not present

## 2020-12-10 DIAGNOSIS — R278 Other lack of coordination: Secondary | ICD-10-CM | POA: Insufficient documentation

## 2020-12-10 DIAGNOSIS — R2689 Other abnormalities of gait and mobility: Secondary | ICD-10-CM | POA: Insufficient documentation

## 2020-12-10 NOTE — Therapy (Signed)
[place] MAIN [facility] SERVICES [address] [place], [place], [address] Phone: [phone]   Fax:  [phone]  Occupational Therapy Treatment  Patient Details  Name: Cory [name] MRN: [id number] Date of Birth: [birth year] No data recorded  Encounter Date: 12/10/2020   OT End of Session - 12/10/20 1149     Visit Number 15    Number of Visits 24    Date for OT Re-Evaluation 12/22/20    Authorization Time Period Reporting period 09/30/2020-11/19/2020    OT Start Time 1145    OT Stop Time 1231    OT Time Calculation (min) 46 min    Equipment Utilized During Treatment 4 wheeled walker    Activity Tolerance Patient tolerated treatment well    Behavior During Therapy WFL for tasks assessed/performed             Past Medical History:  Diagnosis Date   Arthritis    hips and back   Asthma    as a baby-    GERD (gastroesophageal reflux disease)    rare -    Parkinson's disease ([place])    Pneumonia    age 30   Rash    legs- on going    Past Surgical History:  Procedure Laterality Date   APPENDECTOMY     BACK SURGERY     posterior lumbar spine fusion L4-5   COLONOSCOPY     COLONOSCOPY WITH PROPOFOL N/A 06/06/2017   Procedure: COLONOSCOPY WITH PROPOFOL;  Surgeon: Manya Silvas, MD;  Location: [facility] ENDOSCOPY;  Service: Endoscopy;  Laterality: N/A;   KNEE ARTHROSCOPY Bilateral    ACL   KNEE ARTHROSCOPY W/ ACL RECONSTRUCTION     RADIAL KERATOTOMY     ROTATOR CUFF REPAIR Bilateral    SHOULDER ARTHROSCOPY WITH BICEPS TENDON REPAIR Left 04/16/2019   Procedure: SHOULDER ARTHROSCOPY WITH BICEPS TENDON REPAIR, MINI OPEN SUPERIOR CAPSULAR RECONSTRUCTION, BICEPS TENODESIS;  Surgeon: Leim Fabry, MD;  Location: [facility] ORS;  Service: Orthopedics;  Laterality: Left;   Occupational Therapy Treatment: Therapeutic Exericse: Participated in standing UB with moderate resistance for forward motion 3 min, minimal resistance reverse motion x2 min.    Performed bilat shoulder flexion stretch walking hands up/down a vertical dowel x5 reps.  Facilitated forward and lateral reaching for R/L hands pinning therapy resistant clothespins to vertical target.  Rest breaks needed between 2 trials and pt required moderate tactile cues to maintain body mechanics to keep forward flexion and abduction planes of movement.   Self Care: Fall prevention reinforced with recommendations for task modification and environmental modification surrounding pt's recliner where he sleeps.  Pt had a fall over the weekend when he reached down to grab his urinal.  Simulated environmental set up, walker set up, reaching for urinal and reinforced need to keep feet with wide BOS and 1 hand on walker for support.  Made recommendations for additional lighting and keeping urinal higher to avoid bending. Instructed pt that walker should be kept within reach of chairside.    Response to Treatment: See Plan/clinical impression below.      OT Short Term Goals - 11/20/20 1054       OT SHORT TERM GOAL #1   Title Pt will perform BUE HEP with min vc    Baseline Eval: not yet initiated; 11/19/2020: Pt able to perform with min vc    Time 6    Period Weeks    Status Achieved    Target Date 11/10/20  OT SHORT TERM GOAL #2   Title Pt will report RPE of 4 or better to indicate improved tolerance to basic ADLs.    Baseline Eval: RPE of 6 with basic ADLs; 11/19/2020: RPE of 5 with basic ADLs    Time 6    Period Weeks    Status On-going    Target Date 11/10/20      OT SHORT TERM GOAL #3   Title Pt will utilize visual compensation strategies with min vc in order to improve basic and IADL performance.    Baseline Eval: pt reports some diplopia and decreased depth perception which limits ADL/IADL performance; Pt has been instructed in diplopia exercises and requires mod vc for compensation strategies when diplopia is present    Time 6    Period Weeks    Status On-going                OT Long Term Goals - 11/20/20 1058       OT LONG TERM GOAL #1   Title Pt will increase bilat shoulder strength by 1MM grade in order to improve tolerance to UB ADLs/IADLs and reaching activities.    Baseline Eval: R/L shoulder flex/abd 4-/5, R/L shoulder ER 3+/5; stamina is improving but strength grades still same as eval    Time 12    Period Weeks    Status On-going      OT LONG TERM GOAL #2   Title Pt will manage clothing fasteners and looping belt with modified indep.    Baseline Eval: spouse assists.  Difficulty with tying shoes, looping belt, snapping and buttoning buttons; Using compensatory strategies for shoe tying as pt now has elastic shoe laces.  Pt now managing his own belt with slight extra time.  Increased time to fasten buttons.    Time 12    Period Weeks    Status On-going    Target Date 12/22/20      OT LONG TERM GOAL #3   Title Pt will cut food with modified indep.    Baseline Eval: spouse assists to cut food; Indep to cut food.    Time 12    Period Weeks    Status Achieved    Target Date 12/22/20      OT LONG TERM GOAL #4   Title Pt will brush hair with R dominant arm with modified indep.    Baseline Eval: Pt states he must hold R arm up with L hand, and often spouse must assist; 11/19/2020: Pt can reach to top and back of head with RUE but arm fatigues quickly with repetition required to brush hair.    Time 12    Period Weeks    Status On-going    Target Date 12/22/20                Plan - 12/10/20 1405     Clinical Impression Statement Pt reported 2 recent falls over the weekend, no major injury.  OT reviewed fall prevention strategies, task and environmental modifications; pt receptive and verbalized understanding of recommendations.  Pt will benefit from continued skilled Occupational Therapy to address weakness, coordination, and dynamic standing balance deficits which affect ADL performance, working to reduce fall risk and decrease burden  of care on spouse.    OT Occupational Profile and History Detailed Assessment- Review of Records and additional review of physical, cognitive, psychosocial history related to current functional performance    Occupational performance deficits (Please refer to evaluation for details): ADL's;IADL's;Leisure  Body Structure / Function / Physical Skills ADL;Coordination;Endurance;GMC;UE functional use;Balance;Sensation;IADL;Vision;Dexterity;FMC;Strength;Continence;Gait;Mobility    Rehab Potential Good    Clinical Decision Making Several treatment options, min-mod task modification necessary    Comorbidities Affecting Occupational Performance: May have comorbidities impacting occupational performance    Modification or Assistance to Complete Evaluation  Min-Moderate modification of tasks or assist with assess necessary to complete eval    OT Frequency 2x / week    OT Duration 12 weeks    OT Treatment/Interventions Self-care/ADL training;Therapeutic exercise;DME and/or AE instruction;Balance training;Neuromuscular education;Manual Therapy;Visual/perceptual remediation/compensation;Therapeutic activities;Patient/family education    Consulted and Agree with Plan of Care Patient             Patient will benefit from skilled therapeutic intervention in order to improve the following deficits and impairments:   Body Structure / Function / Physical Skills: ADL, Coordination, Endurance, GMC, UE functional use, Balance, Sensation, IADL, Vision, Dexterity, FMC, Strength, Continence, Gait, Mobility       Visit Diagnosis: Parkinson's disease ([place])    Problem List Patient Active Problem List   Diagnosis Date Noted   Focal seizure ([place]) 05/24/2020   Parkinson's disease ([place])    History of migraine headaches    Migraine syndrome    BPH (benign prostatic hyperplasia)    Depression    Degenerative spondylolisthesis 05/07/2016   Leta Speller, MS, OTR/L  Darleene Cleaver, OT/L 12/10/2020, 2:05  PM  [place] MAIN [facility] SERVICES [address] [place], [place], [address] Phone: [phone]   Fax:  [phone]  Name: Cory [name] MRN: [id number] Date of Birth: May 24, 1955

## 2020-12-10 NOTE — Therapy (Signed)
[place] MAIN [facility] SERVICES [address] [place], [place], [address] Phone: [phone]   Fax:  [phone]  Physical Therapy Treatment/RECERT  Patient Details  Name: KYEL PURK MRN: [id number] Date of Birth: [birth year] Referring Provider (PT): Benay Spice   Encounter Date: 12/10/2020   PT End of Session - 12/10/20 1240     Visit Number 21    Number of Visits 45    Date for PT Re-Evaluation 03/04/21    Authorization Type [employer] PPO    Authorization Time Period 09/18/20-12/11/20; next session 1/10 PN 8/31    PT Start Time 1100    PT Stop Time 1144    PT Time Calculation (min) 44 min    Equipment Utilized During Treatment Gait belt    Activity Tolerance Patient tolerated treatment well    Behavior During Therapy WFL for tasks assessed/performed             Past Medical History:  Diagnosis Date   Arthritis    hips and back   Asthma    as a baby-    GERD (gastroesophageal reflux disease)    rare -    Parkinson's disease ([place])    Pneumonia    age 37   Rash    legs- on going    Past Surgical History:  Procedure Laterality Date   APPENDECTOMY     BACK SURGERY     posterior lumbar spine fusion L4-5   COLONOSCOPY     COLONOSCOPY WITH PROPOFOL N/A 06/06/2017   Procedure: COLONOSCOPY WITH PROPOFOL;  Surgeon: Manya Silvas, MD;  Location: [facility] ENDOSCOPY;  Service: Endoscopy;  Laterality: N/A;   KNEE ARTHROSCOPY Bilateral    ACL   KNEE ARTHROSCOPY W/ ACL RECONSTRUCTION     RADIAL KERATOTOMY     ROTATOR CUFF REPAIR Bilateral    SHOULDER ARTHROSCOPY WITH BICEPS TENDON REPAIR Left 04/16/2019   Procedure: SHOULDER ARTHROSCOPY WITH BICEPS TENDON REPAIR, MINI OPEN SUPERIOR CAPSULAR RECONSTRUCTION, BICEPS TENODESIS;  Surgeon: Leim Fabry, MD;  Location: [facility] ORS;  Service: Orthopedics;  Laterality: Left;    There were no vitals filed for this visit.   Subjective Assessment - 12/10/20 1239     Subjective  Patient's wife reports he fell over the weekend.  Reports he still has difficulty with household activities such as bending down and grabbing things from dishwasher.  Had a bad day on Saturday and Sunday but has been having more on days since the weekend.    Pertinent History Patient admitted to hospital 05/23/20 with code stroke and R sided weakness. Imaging negative for acute changes. PMH includes Parkinson's with autonomic instability, asthma, GERD depression, degenerative spondylolisthesis, focal seizures and migraine syndrome. Patient had three ED visits for falls since then. Patient reports when he has the seizures he has pain in his temples and bright flashes in his vision. Last week had three falls in one day.    Limitations Lifting;Standing;Walking;House hold activities    How long can you sit comfortably? sleeps in easy chair.    How long can you stand comfortably? feel unsteady once in standing.    How long can you walk comfortably? difficult to walk during "off period"    Patient Stated Goals stability, have a device that will be most helpful, help to become and stay independent.    Currently in Pain? No/denies                 RECERT Goals performed 12/03/20  Add new goals:  Static stand 5 minutes against pertubation's for shooting/teaching classes.  patient will walk 1 mile walking dog across changing surfaces, will be able walk 20 minutes without LOB and fatigue.  Patient will bend down and pick up items without losing balance to perform iADLs such as emptying dishwasher and feeding dog.  No falls in one month.     Treatment: Squat and pick up 6 cones x 6 trials cues for wide base of support Airex pad reaching across midline x 2 minutes transferring sticky notes Static stand 2 min 17 seconds without LOB Static stand against pertubations. X2 minutes; one near LOB airx pad dynadisc modified tandem stance 2x 30 seconds   Weaving between 6 cones forward and backwards x3  trials From sitting position: bend down touch floor stand up reach for ceiling, bend back down and touch floor  x 10 trials Squat jumps from chair with upper extremity reach toward ceiling 10 times  Ambulate in hallway: -pertubation provided by PT in all planes by GTB x 160 ft  -Forward high knee march backwards ambulation 30 feet x 6 trials cues for wide base of support  Pt educated throughout session about proper posture and technique with exercises. Improved exercise technique, movement at target joints, use of target muscles after min to mod verbal, visual, tactile cues.     Patient is progressing towards functional goals requiring progression of FGA due to meeting current goal.  Some goals performed on 12/03/20, please refer to this note for further details. Addition of new goals addressing IADLs such as bending down and picking up items for tasks such as emptying a dishwasher, walking dog, and static standing. Patient is highly motivated and has a good prognosis. Patient will benefit from skilled physical therapy to improve balance, gait mechanics, and overall functional mobility   Note: Portions of this document were prepared using Dragon voice recognition software and although reviewed may contain unintentional dictation errors in syntax, grammar, or spelling.   Upper Extremity Functional Index Score :   /80   PT Education - 12/10/20 1240     Education Details Goals plan of care exercise technique    Person(s) Educated Patient    Methods Explanation;Demonstration;Tactile cues;Verbal cues    Comprehension Verbalized understanding;Returned demonstration;Verbal cues required;Tactile cues required              PT Short Term Goals - 10/21/20 0903       PT SHORT TERM GOAL #1   Title Patient will be independent in home exercise program to improve strength/mobility for better functional independence with ADLs.    Baseline 6/16: HEP to be given next session 7/19: HEP compliant     Time 4    Period Weeks    Status Achieved    Target Date 10/16/20               PT Long Term Goals - 12/10/20 1231       PT LONG TERM GOAL #1   Title Patient will increase FOTO score to equal to or greater than 51%    to demonstrate statistically significant improvement in mobility and quality of life.    Baseline 6/16: 48% 7/19: 57% 8/31: 50%    Time 12    Period Weeks    Status Achieved      PT LONG TERM GOAL #2   Title Patient will increase Berg Balance score by > 6 points (34/56)  to demonstrate decreased fall risk during functional  activities.    Baseline 6/16: 28/56 7/19: 45/56    Time 12    Period Weeks    Status Achieved      PT LONG TERM GOAL #3   Title Patient will deny any falls over past 4 weeks to demonstrate improved safety awareness at home and work.    Baseline 6/16: multiple falls 7/19: no falls reported 9/7: 3 falls in past month    Time 12    Period Weeks    Status On-going    Target Date 03/04/21      PT LONG TERM GOAL #4   Title Patient will increase six minute walk test distance to >1300 for progression to age norm community ambulator and improve gait ability    Baseline 6/16: 945 ft with multiple near falls 7/19: 1495 ft    Time 12    Period Weeks    Status Achieved      PT LONG TERM GOAL #5   Title Patient will increase Berg Balance score by > 6 points (51/56)  to demonstrate decreased fall risk during functional activities.    Baseline 7/19: 45/56 8/31: 49/56    Time 12    Period Weeks    Status Partially Met    Target Date 03/04/21      Additional Long Term Goals   Additional Long Term Goals Yes      PT LONG TERM GOAL #6   Title Patient will increase Functional Gait Assessment score to >20/30 as to reduce fall risk and improve dynamic gait safety with community ambulation.    Baseline 7/19: 16/30 8/31: 20/30    Time 12    Period Weeks    Status Achieved      PT LONG TERM GOAL #7   Title Patient will increase Functional Gait  Assessment score to >26/30 as to reduce fall risk and improve dynamic gait safety with community ambulation.    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    Baseline Unable to perform without leaning on objects    Time 12    Period Weeks    Status New    Target Date 03/04/21      PT LONG TERM GOAL  #9   TITLE Patient will be able to ambulate approximately 1 mile walking dog across changing surfaces such as grass, concrete, and sidewalk without loss of balance.    Baseline 9/7: unable to perform yet    Time 12    Period Weeks    Status New    Target Date 03/04/21      PT LONG TERM GOAL  #10   TITLE Patient will bend down and pick up items without losing balance to perform iADLs such as emptying dishwasher and feeding dog.    Baseline 9/7: unable to perform    Time 12    Period Weeks    Status New    Target Date 03/04/21                   Plan - 12/10/20 1244     Clinical Impression Statement Patient is progressing towards functional goals requiring progression of FGA due to meeting current goal.  Some goals performed on 12/03/20, please refer to this note for further details. Addition of new goals addressing IADLs such as bending down and picking up items for tasks such as emptying a dishwasher, walking dog, and static standing. Re-introduction of previous met goal addressing falls will be added due to patient increase in falling this past month.  Patient is highly motivated and has a good prognosis. Patient will benefit from skilled physical therapy to improve balance, gait mechanics, and overall functional mobility    Personal Factors and Comorbidities Age;Comorbidity 3+;Fitness;Past/Current Experience;Time since onset of  injury/illness/exacerbation;Transportation    Comorbidities Parkinson's with autonomic instability, asthma, GERD depression, degenerative spondylolisthesis, focal seizures and migraine syndrome    Examination-Activity Limitations Bathing;Bed Mobility;Bend;Caring for [employer];Locomotion Level;Lift;Hygiene/Grooming;Squat;Stairs;Stand;Toileting;Transfers    Examination-Participation Restrictions Church;Cleaning;Community Activity;Driving;Meal Prep;Medication Management;Laundry;Shop;Volunteer;Yard Work    Stability/Clinical Decision Making Unstable/Unpredictable    Rehab Potential Fair    PT Frequency 2x / week    PT Duration 12 weeks    PT Treatment/Interventions ADLs/Self Care Home Management;Biofeedback;Canalith Repostioning;Cryotherapy;Parrafin;Ultrasound;Traction;Moist Heat;Iontophoresis 48m/ml Dexamethasone;Electrical Stimulation;DME Instruction;Gait training;Stair training;Functional mobility training;Neuromuscular re-education;Balance training;Therapeutic exercise;Therapeutic activities;Patient/family education;Orthotic Fit/Training;Manual techniques;Passive range of motion;Manual lymph drainage;Energy conservation;Splinting;Taping;Vasopneumatic Device;Vestibular;Joint Manipulations;Visual/perceptual remediation/compensation    PT Next Visit Plan tai chi, high level balance    PT Home Exercise Plan to give next session    Consulted and Agree with Plan of Care Patient;Family member/caregiver    Family Member Consulted wife             Patient will benefit from skilled therapeutic intervention in order to improve the following deficits and impairments:  Abnormal gait, Decreased activity tolerance, Decreased balance, Decreased knowledge of precautions, Decreased endurance, Decreased coordination, Decreased cognition, Decreased knowledge of use of DME, Decreased mobility, Decreased safety awareness, Difficulty walking, Decreased strength, Impaired flexibility, Impaired  perceived functional ability, Impaired sensation, Impaired tone, Impaired UE functional use, Impaired vision/preception, Improper body mechanics, Postural dysfunction  Visit Diagnosis: Muscle weakness (generalized)  Other abnormalities of gait and mobility  Unsteadiness on feet     Problem List Patient Active Problem List   Diagnosis Date Noted   [place] 05[place]   [place]inson's disease ([place]    History of migraine headaches    Migraine syndrome    BPH (benign prostatic hyperplasia)    Depression    Degenerative spondylolisthesis 05/07/2016   MJanna Arch PT, DPT  12/10/2020, 12:46 PM  [place] [facility] [address] [place] [address]Phone: [phone]  Fax:  [phone] Name: TKAYRON HICKLINMRN: [id number] of Birth: 709-05-1955

## 2020-12-15 ENCOUNTER — Ambulatory Visit: Payer: Medicare PPO

## 2020-12-15 ENCOUNTER — Other Ambulatory Visit: Payer: Self-pay

## 2020-12-15 DIAGNOSIS — M6281 Muscle weakness (generalized): Secondary | ICD-10-CM

## 2020-12-15 DIAGNOSIS — G20 Parkinson's disease: Secondary | ICD-10-CM

## 2020-12-15 DIAGNOSIS — R278 Other lack of coordination: Secondary | ICD-10-CM

## 2020-12-15 DIAGNOSIS — R2689 Other abnormalities of gait and mobility: Secondary | ICD-10-CM

## 2020-12-15 NOTE — Therapy (Signed)
[place] MAIN [facility] SERVICES [address] [place], [place], [address] Phone: [phone]   Fax:  [phone]  Physical Therapy Treatment  Patient Details  Name: Cory [name] MRN: [id number] Date of Birth: Sep 01, 1955 Referring Provider (PT): Benay Spice   Encounter Date: 12/15/2020   PT End of Session - 12/15/20 1155     Visit Number 22    Number of Visits 61    Date for PT Re-Evaluation 03/04/21    Authorization Type [employer] PPO    Authorization Time Period 09/18/20-12/11/20; 2/10 PN 8/31    PT Start Time 1122    PT Stop Time 1145    PT Time Calculation (min) 23 min    Equipment Utilized During Treatment Gait belt    Activity Tolerance Patient tolerated treatment well    Behavior During Therapy WFL for tasks assessed/performed             Past Medical History:  Diagnosis Date   Arthritis    hips and back   Asthma    as a baby-    GERD (gastroesophageal reflux disease)    rare -    Parkinson's disease ([place])    Pneumonia    age 65   Rash    legs- on going    Past Surgical History:  Procedure Laterality Date   APPENDECTOMY     BACK SURGERY     posterior lumbar spine fusion L4-5   COLONOSCOPY     COLONOSCOPY WITH PROPOFOL N/A 06/06/2017   Procedure: COLONOSCOPY WITH PROPOFOL;  Surgeon: Manya Silvas, MD;  Location: [facility] ENDOSCOPY;  Service: Endoscopy;  Laterality: N/A;   KNEE ARTHROSCOPY Bilateral    ACL   KNEE ARTHROSCOPY W/ ACL RECONSTRUCTION     RADIAL KERATOTOMY     ROTATOR CUFF REPAIR Bilateral    SHOULDER ARTHROSCOPY WITH BICEPS TENDON REPAIR Left 04/16/2019   Procedure: SHOULDER ARTHROSCOPY WITH BICEPS TENDON REPAIR, MINI OPEN SUPERIOR CAPSULAR RECONSTRUCTION, BICEPS TENODESIS;  Surgeon: Leim Fabry, MD;  Location: [facility] ORS;  Service: Orthopedics;  Laterality: Left;    There were no vitals filed for this visit.   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 Arrived late to session.    Pertinent History Patient admitted to hospital 05/23/20 with code stroke and R sided weakness. Imaging negative for acute changes. PMH includes Parkinson's with autonomic instability, asthma, GERD depression, degenerative spondylolisthesis, focal seizures and migraine syndrome. Patient had three ED visits for falls since then. Patient reports when he has the seizures he has pain in his temples and bright flashes in his vision. Last week had three falls in one day.    Limitations Lifting;Standing;Walking;House hold activities    How long can you sit comfortably? sleeps in easy chair.    How long can you stand comfortably? feel unsteady once in standing.    How long can you walk comfortably? difficult to walk during "off period"    Patient Stated Goals stability, have a device that will be most helpful, help to become and stay independent.    Currently in Pain? No/denies                  Neuro Re-ed cues for sequencing and body mechanics Stand with cross body punches for coordination, stabilization against pertubation's. -cross body punches with varying arc of reach x 60 seconds x 2 trials -  quick punch 10 seconds as rapidly as possible cross body 2x 15 seconds -cross cross duck x 60 seconds for increased challenge of coordination  TherEx: cues for body mechanics and muscle recruitment  ambulate with SPC 4x 86 ft with cues for placement of cane for optimal ambulation/safety, close CGA required  squat jumps 10x from chair Walking lunges 4x 20 ft with frequent near LOB; close CGA required Bilateral heel raise 20x with UE support on 2x4      Pt educated throughout session about proper posture and technique with exercises. Improved exercise technique, movement at target joints, use of target muscles after min to mod verbal, visual, tactile cues.  Patient arrived late to session limiting session  duration. Patient is challenged with placement of cane with shortened steppage noted. This is improved throughout session with external cues. Cross body coordination in static positioning will continue to be an area of focus as patient is challenged to maintain position without steppage.Patient will benefit from skilled physical therapy to improve balance, gait mechanics, and overall functional mobility              PT Education - 12/15/20 1155     Education Details exercise technique, body mechanics    Person(s) Educated Patient    Methods Explanation;Demonstration;Tactile cues;Verbal cues    Comprehension Verbalized understanding;Returned demonstration;Verbal cues required;Tactile cues required              PT Short Term Goals - 10/21/20 0903       PT SHORT TERM GOAL #1   Title Patient will be independent in home exercise program to improve strength/mobility for better functional independence with ADLs.    Baseline 6/16: HEP to be given next session 7/19: HEP compliant    Time 4    Period Weeks    Status Achieved    Target Date 10/16/20               PT Long Term Goals - 12/10/20 1231       PT LONG TERM GOAL #1   Title Patient will increase FOTO score to equal to or greater than 51%    to demonstrate statistically significant improvement in mobility and quality of life.    Baseline 6/16: 48% 7/19: 57% 8/31: 50%    Time 12    Period Weeks    Status Achieved      PT LONG TERM GOAL #2   Title Patient will increase Berg Balance score by > 6 points (34/56)  to demonstrate decreased fall risk during functional activities.    Baseline 6/16: 28/56 7/19: 45/56    Time 12    Period Weeks    Status Achieved      PT LONG TERM GOAL #3   Title Patient will deny any falls over past 4 weeks to demonstrate improved safety awareness at home and work.    Baseline 6/16: multiple falls 7/19: no falls reported 9/7: 3 falls in past month    Time 12    Period Weeks     Status On-going    Target Date 03/04/21      PT LONG TERM GOAL #4   Title Patient will increase six minute walk test distance to >1300 for progression to age norm community ambulator and improve gait ability    Baseline 6/16: 945 ft with multiple near falls 7/19: 1495 ft    Time 12    Period Weeks    Status Achieved      PT  LONG TERM GOAL #5   Title Patient will increase Berg Balance score by > 6 points (51/56)  to demonstrate decreased fall risk during functional activities.    Baseline 7/19: 45/56 8/31: 49/56    Time 12    Period Weeks    Status Partially Met    Target Date 03/04/21      Additional Long Term Goals   Additional Long Term Goals Yes      PT LONG TERM GOAL #6   Title Patient will increase Functional Gait Assessment score to >20/30 as to reduce fall risk and improve dynamic gait safety with community ambulation.    Baseline 7/19: 16/30 8/31: 20/30    Time 12    Period Weeks    Status Achieved      PT LONG TERM GOAL #7   Title Patient will increase Functional Gait Assessment score to >26/30 as to reduce fall risk and improve dynamic gait safety with community ambulation.    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    Baseline Unable to perform without leaning on objects    Time 12    Period Weeks    Status New    Target Date 03/04/21      PT LONG TERM GOAL  #9   TITLE Patient will be able to ambulate approximately 1 mile walking dog across changing surfaces such as grass, concrete, and sidewalk without loss of balance.    Baseline 9/7: unable to perform yet    Time 12    Period Weeks    Status New    Target Date 03/04/21      PT LONG TERM GOAL  #10   TITLE Patient will bend down and pick up items without losing balance  to perform iADLs such as emptying dishwasher and feeding dog.    Baseline 9/7: unable to perform    Time 12    Period Weeks    Status New    Target Date 03/04/21                   Plan - 12/15/20 1208     Clinical Impression Statement Patient arrived late to session limiting session duration. Patient is challenged with placement of cane with shortened steppage noted. This is improved throughout session with external cues. Cross body coordination in static positioning will continue to be an area of focus as patient is challenged to maintain position without steppage.Patient will benefit from skilled physical therapy to improve balance, gait mechanics, and overall functional mobility    Personal Factors and Comorbidities Age;Comorbidity 3+;Fitness;Past/Current Experience;Time since onset of injury/illness/exacerbation;Transportation    Comorbidities Parkinson's with autonomic instability, asthma, GERD depression, degenerative spondylolisthesis, focal seizures and migraine syndrome    Examination-Activity Limitations Bathing;Bed Mobility;Bend;Caring for [employer];Locomotion Level;Lift;Hygiene/Grooming;Squat;Stairs;Stand;Toileting;Transfers    Examination-Participation Restrictions Church;Cleaning;Community Activity;Driving;Meal Prep;Medication Management;Laundry;Shop;Volunteer;Yard Work    Stability/Clinical Decision Making Unstable/Unpredictable    Rehab Potential Fair    PT Frequency 2x / week    PT Duration 12 weeks    PT Treatment/Interventions ADLs/Self Care Home Management;Biofeedback;Canalith Repostioning;Cryotherapy;Parrafin;Ultrasound;Traction;Moist Heat;Iontophoresis 55m/ml Dexamethasone;Electrical Stimulation;DME Instruction;Gait training;Stair training;Functional mobility training;Neuromuscular re-education;Balance training;Therapeutic exercise;Therapeutic activities;Patient/family education;Orthotic Fit/Training;Manual techniques;Passive range of  motion;Manual lymph  drainage;Energy conservation;Splinting;Taping;Vasopneumatic Device;Vestibular;Joint Manipulations;Visual/perceptual remediation/compensation    PT Next Visit Plan tai chi, high level balance    PT Home Exercise Plan to give next session    Consulted and Agree with Plan of Care Patient;Family member/caregiver    Family Member Consulted wife             Patient will benefit from skilled therapeutic intervention in order to improve the following deficits and impairments:  Abnormal gait, Decreased activity tolerance, Decreased balance, Decreased knowledge of precautions, Decreased endurance, Decreased coordination, Decreased cognition, Decreased knowledge of use of DME, Decreased mobility, Decreased safety awareness, Difficulty walking, Decreased strength, Impaired flexibility, Impaired perceived functional ability, Impaired sensation, Impaired tone, Impaired UE functional use, Impaired vision/preception, Improper body mechanics, Postural dysfunction  Visit Diagnosis: Parkinson's disease ([place])  Muscle weakness (generalized)  Other abnormalities of gait and mobility     Problem List Patient Active Problem List   Diagnosis Date Noted   Focal seizure ([place]) 05/24/2020   Parkinson's disease ([place])    History of migraine headaches    Migraine syndrome    BPH (benign prostatic hyperplasia)    Depression    Degenerative spondylolisthesis 05/07/2016    Janna Arch, PT, DPT  12/15/2020, 12:10 PM  [place] MAIN [facility] SERVICES [address] [place], [place], [address] Phone: [phone]   Fax:  [phone]  Name: Cory [name] MRN: [id number] Date of Birth: 24-Apr-1955

## 2020-12-15 NOTE — Therapy (Signed)
[place] MAIN [facility] SERVICES [address] [place], [place], [address] Phone: [phone]   Fax:  [phone]  Occupational Therapy Treatment  Patient Details  Name: Cory [name] MRN: [id number] Date of Birth: 09-06-1955 No data recorded  Encounter Date: 12/15/2020   OT End of Session - 12/15/20 1240     Visit Number 16    Number of Visits 24    Date for OT Re-Evaluation 12/22/20    Authorization Time Period Reporting period 09/30/2020-11/19/2020    OT Start Time 1145    OT Stop Time 1230    OT Time Calculation (min) 45 min    Equipment Utilized During Treatment 4 wheeled walker, cane    Activity Tolerance Patient tolerated treatment well    Behavior During Therapy WFL for tasks assessed/performed             Past Medical History:  Diagnosis Date   Arthritis    hips and back   Asthma    as a baby-    GERD (gastroesophageal reflux disease)    rare -    Parkinson's disease ([place])    Pneumonia    age 16   Rash    legs- on going    Past Surgical History:  Procedure Laterality Date   APPENDECTOMY     BACK SURGERY     posterior lumbar spine fusion L4-5   COLONOSCOPY     COLONOSCOPY WITH PROPOFOL N/A 06/06/2017   Procedure: COLONOSCOPY WITH PROPOFOL;  Surgeon: Manya Silvas, MD;  Location: [facility] ENDOSCOPY;  Service: Endoscopy;  Laterality: N/A;   KNEE ARTHROSCOPY Bilateral    ACL   KNEE ARTHROSCOPY W/ ACL RECONSTRUCTION     RADIAL KERATOTOMY     ROTATOR CUFF REPAIR Bilateral    SHOULDER ARTHROSCOPY WITH BICEPS TENDON REPAIR Left 04/16/2019   Procedure: SHOULDER ARTHROSCOPY WITH BICEPS TENDON REPAIR, MINI OPEN SUPERIOR CAPSULAR RECONSTRUCTION, BICEPS TENODESIS;  Surgeon: Leim Fabry, MD;  Location: [facility] ORS;  Service: Orthopedics;  Laterality: Left;    There were no vitals filed for this visit.   Subjective Assessment - 12/15/20 1238     Subjective  "I vibrated all weekend until Sunday afternoon.  I wasn't able to do  much."    Patient is accompanied by: Family member    Pertinent History Parkinson's Disease, R sided weakness, falls    Limitations balance, strength, coordination, visual perception, problem solving, processing information    Patient Stated Goals "I want to get stronger.  It frustrates me when I have to ask for help with things like cutting my food or buttoning a button."    Pain Score 1     Pain Location Shoulder    Pain Orientation Left    Pain Descriptors / Indicators Aching;Tightness    Pain Type Chronic pain    Pain Onset More than a month ago    Pain Frequency Intermittent    Aggravating Factors  raising arm up past shoulder level    Multiple Pain Sites No            Occupational Therapy Treatment: Therapeutic Exercise: Participation in UBE in standing x5 min forward with min resistance, working to increase BUE strength and activity tolerance for self care.  Seated rest break with recommendations for shooting stance to reduce risk of LOB, wide BOS and 1 foot in front of the other, and placement of pistol holster on R hip instead of behind back to minimize shoulder strain, as pt  reports that it's becoming more difficult to reach behind his back d/t shoulder tightness and weakness.   Self Care: Pt unloaded dishes from dishwasher, intermittent cues for safety and body mechanics as follows.  Encouragement to stand beside dishwasher with wide BOS, use 1 hand on countertop for stability while the other arm performs necessary reaching.  Advised need to avoid grasping more than 1 or 2 dishes at a time to minimize strain on shoulders.  Reinforced need to take small steps to turn when changing directions vs stepping 1 foot across the other to make a 180 degree turn (avoid cross stepping).  When returning dishes to cabinets across the kitchen, use 1 hand on cane and 1 arm for item transport.  Pt able to recall all recommendations but needs reinforcement for carry over.   Response to  Treatment: See Plan/clinical impression below.     OT Education - 12/15/20 1240     Education Details fall prevention and body mechanics with dynamic standing tasks    Person(s) Educated Patient    Methods Explanation;Verbal cues;Demonstration    Comprehension Verbalized understanding;Returned demonstration              OT Short Term Goals - 11/20/20 1054       OT SHORT TERM GOAL #1   Title Pt will perform BUE HEP with min vc    Baseline Eval: not yet initiated; 11/19/2020: Pt able to perform with min vc    Time 6    Period Weeks    Status Achieved    Target Date 11/10/20      OT SHORT TERM GOAL #2   Title Pt will report RPE of 4 or better to indicate improved tolerance to basic ADLs.    Baseline Eval: RPE of 6 with basic ADLs; 11/19/2020: RPE of 5 with basic ADLs    Time 6    Period Weeks    Status On-going    Target Date 11/10/20      OT SHORT TERM GOAL #3   Title Pt will utilize visual compensation strategies with min vc in order to improve basic and IADL performance.    Baseline Eval: pt reports some diplopia and decreased depth perception which limits ADL/IADL performance; Pt has been instructed in diplopia exercises and requires mod vc for compensation strategies when diplopia is present    Time 6    Period Weeks    Status On-going               OT Long Term Goals - 11/20/20 1058       OT LONG TERM GOAL #1   Title Pt will increase bilat shoulder strength by 1MM grade in order to improve tolerance to UB ADLs/IADLs and reaching activities.    Baseline Eval: R/L shoulder flex/abd 4-/5, R/L shoulder ER 3+/5; stamina is improving but strength grades still same as eval    Time 12    Period Weeks    Status On-going      OT LONG TERM GOAL #2   Title Pt will manage clothing fasteners and looping belt with modified indep.    Baseline Eval: spouse assists.  Difficulty with tying shoes, looping belt, snapping and buttoning buttons; Using compensatory strategies  for shoe tying as pt now has elastic shoe laces.  Pt now managing his own belt with slight extra time.  Increased time to fasten buttons.    Time 12    Period Weeks    Status On-going  Target Date 12/22/20      OT LONG TERM GOAL #3   Title Pt will cut food with modified indep.    Baseline Eval: spouse assists to cut food; Indep to cut food.    Time 12    Period Weeks    Status Achieved    Target Date 12/22/20      OT LONG TERM GOAL #4   Title Pt will brush hair with R dominant arm with modified indep.    Baseline Eval: Pt states he must hold R arm up with L hand, and often spouse must assist; 11/19/2020: Pt can reach to top and back of head with RUE but arm fatigues quickly with repetition required to brush hair.    Time 12    Period Weeks    Status On-going    Target Date 12/22/20                   Plan - 12/15/20 1257     Clinical Impression Statement Pt required min A to lift several plates of dishes using BUEs to first shelf of cabinet.  Advised pt needed to lift 1 plate at a time and keep 1 hand on countertop for support.  Pt easily able to lift 1 mug onto 2nd shelf and 1 plate onto first shelf.  Pt needs supv for safety cues to reduce fall risk while bending, reaching, and transporting items throughout the kitchen.  Pt will benefit from continued skilled OT for improving dynamic standing balance to maximize indep with IADLs and leisure activities.    OT Occupational Profile and History Detailed Assessment- Review of Records and additional review of physical, cognitive, psychosocial history related to current functional performance    Occupational performance deficits (Please refer to evaluation for details): ADL's;IADL's;Leisure    Body Structure / Function / Physical Skills ADL;Coordination;Endurance;GMC;UE functional use;Balance;Sensation;IADL;Vision;Dexterity;FMC;Strength;Continence;Gait;Mobility    Rehab Potential Good    Clinical Decision Making Several treatment  options, min-mod task modification necessary    Comorbidities Affecting Occupational Performance: May have comorbidities impacting occupational performance    Modification or Assistance to Complete Evaluation  Min-Moderate modification of tasks or assist with assess necessary to complete eval    OT Frequency 2x / week    OT Duration 12 weeks    OT Treatment/Interventions Self-care/ADL training;Therapeutic exercise;DME and/or AE instruction;Balance training;Neuromuscular education;Manual Therapy;Visual/perceptual remediation/compensation;Therapeutic activities;Patient/family education    Consulted and Agree with Plan of Care Patient             Patient will benefit from skilled therapeutic intervention in order to improve the following deficits and impairments:   Body Structure / Function / Physical Skills: ADL, Coordination, Endurance, GMC, UE functional use, Balance, Sensation, IADL, Vision, Dexterity, FMC, Strength, Continence, Gait, Mobility       Visit Diagnosis: Parkinson's disease ([place])  Muscle weakness (generalized)  Other lack of coordination    Problem List Patient Active Problem List   Diagnosis Date Noted   Focal seizure ([place]) 05/24/2020   Parkinson's disease ([place])    History of migraine headaches    Migraine syndrome    BPH (benign prostatic hyperplasia)    Depression    Degenerative spondylolisthesis 05/07/2016   Leta Speller, MS, OTR/L  Darleene Cleaver, OT/L 12/15/2020, 12:57 PM  [place] [address] [place], [place], [address] Phone: [phone]   Fax:  [phone]  Name: Cory [name] MRN: [id number] Date of Birth: 1955/07/01

## 2020-12-17 ENCOUNTER — Ambulatory Visit: Payer: Medicare PPO

## 2020-12-17 ENCOUNTER — Other Ambulatory Visit: Payer: Self-pay

## 2020-12-17 ENCOUNTER — Ambulatory Visit: Payer: Medicare PPO | Admitting: Physical Therapy

## 2020-12-17 DIAGNOSIS — M6281 Muscle weakness (generalized): Secondary | ICD-10-CM

## 2020-12-17 DIAGNOSIS — G20 Parkinson's disease: Secondary | ICD-10-CM

## 2020-12-17 DIAGNOSIS — R2689 Other abnormalities of gait and mobility: Secondary | ICD-10-CM

## 2020-12-17 DIAGNOSIS — R278 Other lack of coordination: Secondary | ICD-10-CM

## 2020-12-17 DIAGNOSIS — R2681 Unsteadiness on feet: Secondary | ICD-10-CM

## 2020-12-17 NOTE — Therapy (Signed)
[place] MAIN [facility] SERVICES [address] [place], [place], [address] Phone: [phone]   Fax:  [phone]  Occupational Therapy Treatment  Patient Details  Name: DERAY KIMBRO MRN: [id number] Date of Birth: 06/04/1955 No data recorded  Encounter Date: 12/17/2020   OT End of Session - 12/17/20 1647     Visit Number 17    Number of Visits 24    Date for OT Re-Evaluation 12/22/20    Authorization Time Period Reporting period 09/30/2020-11/19/2020    OT Start Time 1145    OT Stop Time 25    OT Time Calculation (min) 45 min    Equipment Utilized During Treatment 4 wheeled walker    Activity Tolerance Patient tolerated treatment well    Behavior During Therapy WFL for tasks assessed/performed             Past Medical History:  Diagnosis Date   Arthritis    hips and back   Asthma    as a baby-    GERD (gastroesophageal reflux disease)    rare -    Parkinson's disease ([place])    Pneumonia    age 69   Rash    legs- on going    Past Surgical History:  Procedure Laterality Date   APPENDECTOMY     BACK SURGERY     posterior lumbar spine fusion L4-5   COLONOSCOPY     COLONOSCOPY WITH PROPOFOL N/A 06/06/2017   Procedure: COLONOSCOPY WITH PROPOFOL;  Surgeon: Manya Silvas, MD;  Location: [facility] ENDOSCOPY;  Service: Endoscopy;  Laterality: N/A;   KNEE ARTHROSCOPY Bilateral    ACL   KNEE ARTHROSCOPY W/ ACL RECONSTRUCTION     RADIAL KERATOTOMY     ROTATOR CUFF REPAIR Bilateral    SHOULDER ARTHROSCOPY WITH BICEPS TENDON REPAIR Left 04/16/2019   Procedure: SHOULDER ARTHROSCOPY WITH BICEPS TENDON REPAIR, MINI OPEN SUPERIOR CAPSULAR RECONSTRUCTION, BICEPS TENODESIS;  Surgeon: Leim Fabry, MD;  Location: [facility] ORS;  Service: Orthopedics;  Laterality: Left;    There were no vitals filed for this visit.   Subjective Assessment - 12/17/20 1644     Subjective  "I took a driving test yesterday with a private company to satisfy my  wife's concerns.  They said I need to work on my speed (was going too fast) and I kept veering to the L."    Patient is accompanied by: Family member    Pertinent History Parkinson's Disease, R sided weakness, falls    Limitations balance, strength, coordination, visual perception, problem solving, processing information    Patient Stated Goals "I want to get stronger.  It frustrates me when I have to ask for help with things like cutting my food or buttoning a button."    Currently in Pain? Yes    Pain Score 2     Pain Location Shoulder    Pain Orientation Left    Pain Descriptors / Indicators Aching;Tightness;Sore    Pain Type Chronic pain    Pain Onset More than a month ago    Pain Frequency Intermittent    Aggravating Factors  raising arm up past shoulder level    Multiple Pain Sites No           Occupational Therapy Treatment: Self Care: Pt reported it took him 10 min to tie his shoe laces today, as he was wearing an alternate pair of shoes that did not have his elastic laces in them.  OT offered to add elastic  laces to these shoes, but pt reported that he needed to "push through" and tie his laces, despite the length of time required.  Pt admitted that when he "pushes through" to do a task "the normal way" if often becomes fatigued as a result.  OT reinforced benefits of compensatory strategies for self care in order to conserve energy for other tasks.  Encouraged pt to prioritize tasks that are important to him and think about the energy expenditure they require in order to plan ahead for being able to participate in other tasks throughout the day.  Pt continues to report that he wants to do things as "normal as possible," but OT encouraged pt to reconsider his "new normal" d/t the progressive nature of PD.  Pt verbalized understanding, but continues to lack insight into benefits of modified strategies or realistic task completion, including verbalizing his desire to use a chain saw  again for cutting wood, which spouse and OT have noted this to be an unsafe task.  Continued education will be beneficial to maximize pt's indep with self care, efficiency, and QOL.   Therapeutic Exercise: Pt participated in Sci Fit on level 3.5 x8 min with fairly good tolerance.  Pt reported slight soreness in L shoulder upon completion.  Used therapy resistant clothespins with BUEs to address lateral, 3 point, and 2 point pinch, reaching in a horizontal plane at table top level, and vertical plane to facilitate increased shoulder flexion strength.  Rest breaks needed between sets.  2 trials each.   Response to Treatment: See Plan/clinical impression below.    OT Education - 12/17/20 1646     Education Details benefits of compensatory ADL strategies to enable energy conservation    Person(s) Educated Patient;Spouse    Methods Explanation;Verbal cues;Demonstration    Comprehension Verbalized understanding;Need further instruction              OT Short Term Goals - 11/20/20 1054       OT SHORT TERM GOAL #1   Title Pt will perform BUE HEP with min vc    Baseline Eval: not yet initiated; 11/19/2020: Pt able to perform with min vc    Time 6    Period Weeks    Status Achieved    Target Date 11/10/20      OT SHORT TERM GOAL #2   Title Pt will report RPE of 4 or better to indicate improved tolerance to basic ADLs.    Baseline Eval: RPE of 6 with basic ADLs; 11/19/2020: RPE of 5 with basic ADLs    Time 6    Period Weeks    Status On-going    Target Date 11/10/20      OT SHORT TERM GOAL #3   Title Pt will utilize visual compensation strategies with min vc in order to improve basic and IADL performance.    Baseline Eval: pt reports some diplopia and decreased depth perception which limits ADL/IADL performance; Pt has been instructed in diplopia exercises and requires mod vc for compensation strategies when diplopia is present    Time 6    Period Weeks    Status On-going                OT Long Term Goals - 11/20/20 1058       OT LONG TERM GOAL #1   Title Pt will increase bilat shoulder strength by 1MM grade in order to improve tolerance to UB ADLs/IADLs and reaching activities.    Baseline Eval: R/L  shoulder flex/abd 4-/5, R/L shoulder ER 3+/5; stamina is improving but strength grades still same as eval    Time 12    Period Weeks    Status On-going      OT LONG TERM GOAL #2   Title Pt will manage clothing fasteners and looping belt with modified indep.    Baseline Eval: spouse assists.  Difficulty with tying shoes, looping belt, snapping and buttoning buttons; Using compensatory strategies for shoe tying as pt now has elastic shoe laces.  Pt now managing his own belt with slight extra time.  Increased time to fasten buttons.    Time 12    Period Weeks    Status On-going    Target Date 12/22/20      OT LONG TERM GOAL #3   Title Pt will cut food with modified indep.    Baseline Eval: spouse assists to cut food; Indep to cut food.    Time 12    Period Weeks    Status Achieved    Target Date 12/22/20      OT LONG TERM GOAL #4   Title Pt will brush hair with R dominant arm with modified indep.    Baseline Eval: Pt states he must hold R arm up with L hand, and often spouse must assist; 11/19/2020: Pt can reach to top and back of head with RUE but arm fatigues quickly with repetition required to brush hair.    Time 12    Period Weeks    Status On-going    Target Date 12/22/20                   Plan - 12/17/20 1701     Clinical Impression Statement Pt continues to report that he wants to do things as "normal as possible," but OT encouraged pt to reconsider his "new normal" d/t the progressive nature of PD.  Pt verbalized understanding, but continues to lack insight into benefits of modified strategies or realistic task completion, including verbalizing his desire to use a chain saw again for cutting wood, which spouse and OT have noted this to be  an unsafe task.  Continued education will be beneficial to maximize pt's indep with self care, efficiency, and QOL.  Continued BUE strengthening and coordination activities will be beneficial to maximize indep with self care while reducing burden of care on spouse.    OT Occupational Profile and History Detailed Assessment- Review of Records and additional review of physical, cognitive, psychosocial history related to current functional performance    Occupational performance deficits (Please refer to evaluation for details): ADL's;IADL's;Leisure    Body Structure / Function / Physical Skills ADL;Coordination;Endurance;GMC;UE functional use;Balance;Sensation;IADL;Vision;Dexterity;FMC;Strength;Continence;Gait;Mobility    Rehab Potential Good    Clinical Decision Making Several treatment options, min-mod task modification necessary    Comorbidities Affecting Occupational Performance: May have comorbidities impacting occupational performance    Modification or Assistance to Complete Evaluation  Min-Moderate modification of tasks or assist with assess necessary to complete eval    OT Frequency 2x / week    OT Duration 12 weeks    OT Treatment/Interventions Self-care/ADL training;Therapeutic exercise;DME and/or AE instruction;Balance training;Neuromuscular education;Manual Therapy;Visual/perceptual remediation/compensation;Therapeutic activities;Patient/family education    Consulted and Agree with Plan of Care Patient             Patient will benefit from skilled therapeutic intervention in order to improve the following deficits and impairments:   Body Structure / Function / Physical Skills: ADL, Coordination, Endurance, GMC, UE  functional use, Balance, Sensation, IADL, Vision, Dexterity, FMC, Strength, Continence, Gait, Mobility       Visit Diagnosis: Parkinson's disease ([place])  Muscle weakness (generalized)  Other lack of coordination    Problem List Patient Active Problem List    Diagnosis Date Noted   Focal seizure ([place]) 05/24/2020   Parkinson's disease ([place])    History of migraine headaches    Migraine syndrome    BPH (benign prostatic hyperplasia)    Depression    Degenerative spondylolisthesis 05/07/2016   Leta Speller, MS, OTR/L  Darleene Cleaver, OT/L 12/17/2020, 5:06 PM  [place] MAIN [facility] SERVICES [address] [place], [place], [address] Phone: [phone]   Fax:  [phone]  Name: NORVEL SHALA MRN: [id number] Date of Birth: 20-Jan-1956

## 2020-12-17 NOTE — Therapy (Signed)
[place] MAIN [facility] SERVICES [address] [place], [place], [address] Phone: [phone]   Fax:  [phone]  Physical Therapy Treatment  Patient Details  Name: Cory [name] MRN: [id number] Date of Birth: Dec 26, 1955 Referring Provider (PT): Benay Spice   Encounter Date: 12/17/2020   PT End of Session - 12/17/20 1317     Visit Number 23    Number of Visits 45    Date for PT Re-Evaluation 03/04/21    Authorization Type [employer] PPO    Authorization Time Period 09/18/20-12/11/20; 2/10 PN 8/31    PT Start Time 1101    PT Stop Time 1145    PT Time Calculation (min) 44 min    Equipment Utilized During Treatment Gait belt    Activity Tolerance Patient tolerated treatment well    Behavior During Therapy WFL for tasks assessed/performed             Past Medical History:  Diagnosis Date   Arthritis    hips and back   Asthma    as a baby-    GERD (gastroesophageal reflux disease)    rare -    Parkinson's disease ([place])    Pneumonia    age 46   Rash    legs- on going    Past Surgical History:  Procedure Laterality Date   APPENDECTOMY     BACK SURGERY     posterior lumbar spine fusion L4-5   COLONOSCOPY     COLONOSCOPY WITH PROPOFOL N/A 06/06/2017   Procedure: COLONOSCOPY WITH PROPOFOL;  Surgeon: Manya Silvas, MD;  Location: [facility] ENDOSCOPY;  Service: Endoscopy;  Laterality: N/A;   KNEE ARTHROSCOPY Bilateral    ACL   KNEE ARTHROSCOPY W/ ACL RECONSTRUCTION     RADIAL KERATOTOMY     ROTATOR CUFF REPAIR Bilateral    SHOULDER ARTHROSCOPY WITH BICEPS TENDON REPAIR Left 04/16/2019   Procedure: SHOULDER ARTHROSCOPY WITH BICEPS TENDON REPAIR, MINI OPEN SUPERIOR CAPSULAR RECONSTRUCTION, BICEPS TENODESIS;  Surgeon: Leim Fabry, MD;  Location: [facility] ORS;  Service: Orthopedics;  Laterality: Left;    There were no vitals filed for this visit.     Neuro Re-ed cues for sequencing and body mechanics -Stand with cross body  punches for coordination, stabilization against pertubation's. -cross body punches with varying arc of reach x 60 seconds x 2 trials -jab cross duck x 60 seconds for increased challenge of coordination -jab cross slip x 2 minutes with focus mitts for challenge of coordination, reaction time and lateral weight shifting -jab cross roll x 2 minutes with focus mitts for challenge of coordination, reaction time and lateral weight shifting -various punches to PB as PT held PB and challenged pt to move with multidirectional footwork. Then PT and pt switched roles for pt to be challenged by perturbations of PB punches. VC on larger steps, wide BOS and to avoid cross over of feet. 5 minutes  -large diagonal steps, emphasis on lateral weight shifting with 3 second SLS hold each step; 4 x 2f, CGA for frequent steadying.   TherEx: cues for body mechanics and muscle recruitment    Squat jumps 10x from chair Walking lunges 6 x 244fwith occasional near LOB; 4 reps with weighted orange ball, close CGA required to steady Bilateral heel raise 2 x 20 reps with UE support  From sitting position: bend down touch floor stand up reach for ceiling, bend back down and touch floor  x 10 trials  Pt educated throughout session about proper posture and technique with exercises. Improved exercise technique, movement at target joints, use of target muscles after min to mod verbal, visual, tactile cues.   Patient pleasant with excellent motivation throughout session today. Continued cross body coordination in static positioning with occasional steppage and VC for larger amplitude movements. Incorporated increased lateral weight shifting and multidirectional footwork into session challenging pt with multitasking and maintaining dynamic stability with perturbations. Patient will benefit from skilled physical therapy to improve balance, gait mechanics, and overall functional mobility        PT Short Term Goals -  10/21/20 0903       PT SHORT TERM GOAL #1   Title Patient will be independent in home exercise program to improve strength/mobility for better functional independence with ADLs.    Baseline 6/16: HEP to be given next session 7/19: HEP compliant    Time 4    Period Weeks    Status Achieved    Target Date 10/16/20               PT Long Term Goals - 12/10/20 1231       PT LONG TERM GOAL #1   Title Patient will increase FOTO score to equal to or greater than 51%    to demonstrate statistically significant improvement in mobility and quality of life.    Baseline 6/16: 48% 7/19: 57% 8/31: 50%    Time 12    Period Weeks    Status Achieved      PT LONG TERM GOAL #2   Title Patient will increase Berg Balance score by > 6 points (34/56)  to demonstrate decreased fall risk during functional activities.    Baseline 6/16: 28/56 7/19: 45/56    Time 12    Period Weeks    Status Achieved      PT LONG TERM GOAL #3   Title Patient will deny any falls over past 4 weeks to demonstrate improved safety awareness at home and work.    Baseline 6/16: multiple falls 7/19: no falls reported 9/7: 3 falls in past month    Time 12    Period Weeks    Status On-going    Target Date 03/04/21      PT LONG TERM GOAL #4   Title Patient will increase six minute walk test distance to >1300 for progression to age norm community ambulator and improve gait ability    Baseline 6/16: 945 ft with multiple near falls 7/19: 1495 ft    Time 12    Period Weeks    Status Achieved      PT LONG TERM GOAL #5   Title Patient will increase Berg Balance score by > 6 points (51/56)  to demonstrate decreased fall risk during functional activities.    Baseline 7/19: 45/56 8/31: 49/56    Time 12    Period Weeks    Status Partially Met    Target Date 03/04/21      Additional Long Term Goals   Additional Long Term Goals Yes      PT LONG TERM GOAL #6   Title Patient will increase Functional Gait Assessment score to  >20/30 as to reduce fall risk and improve dynamic gait safety with community ambulation.    Baseline 7/19: 16/30 8/31: 20/30    Time 12    Period Weeks    Status Achieved      PT LONG TERM GOAL #7   Title Patient will  increase Functional Gait Assessment score to >26/30 as to reduce fall risk and improve dynamic gait safety with community ambulation.    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    Baseline Unable to perform without leaning on objects    Time 12    Period Weeks    Status New    Target Date 03/04/21      PT LONG TERM GOAL  #9   TITLE Patient will be able to ambulate approximately 1 mile walking dog across changing surfaces such as grass, concrete, and sidewalk without loss of balance.    Baseline 9/7: unable to perform yet    Time 12    Period Weeks    Status New    Target Date 03/04/21      PT LONG TERM GOAL  #10   TITLE Patient will bend down and pick up items without losing balance to perform iADLs such as emptying dishwasher and feeding dog.    Baseline 9/7: unable to perform    Time 12    Period Weeks    Status New    Target Date 03/04/21                   Plan - 12/17/20 1317     Clinical Impression Statement Patient pleasant with excellent motivation throughout session today. Continued cross body coordination in static positioning with occasional steppage and VC for larger amplitude movements. Incorporated increased lateral weight shifting and multidirectional footwork into session challenging pt with multitasking and maintaining dynamic stability with perturbations. Patient will benefit from skilled physical therapy to improve balance, gait mechanics, and overall functional mobility.    Personal Factors and  Comorbidities Age;Comorbidity 3+;Fitness;Past/Current Experience;Time since onset of injury/illness/exacerbation;Transportation    Comorbidities Parkinson's with autonomic instability, asthma, GERD depression, degenerative spondylolisthesis, focal seizures and migraine syndrome    Examination-Activity Limitations Bathing;Bed Mobility;Bend;Caring for [employer];Locomotion Level;Lift;Hygiene/Grooming;Squat;Stairs;Stand;Toileting;Transfers    Examination-Participation Restrictions Church;Cleaning;Community Activity;Driving;Meal Prep;Medication Management;Laundry;Shop;Volunteer;Yard Work    Stability/Clinical Decision Making Unstable/Unpredictable    Rehab Potential Fair    PT Frequency 2x / week    PT Duration 12 weeks    PT Treatment/Interventions ADLs/Self Care Home Management;Biofeedback;Canalith Repostioning;Cryotherapy;Parrafin;Ultrasound;Traction;Moist Heat;Iontophoresis 63m/ml Dexamethasone;Electrical Stimulation;DME Instruction;Gait training;Stair training;Functional mobility training;Neuromuscular re-education;Balance training;Therapeutic exercise;Therapeutic activities;Patient/family education;Orthotic Fit/Training;Manual techniques;Passive range of motion;Manual lymph drainage;Energy conservation;Splinting;Taping;Vasopneumatic Device;Vestibular;Joint Manipulations;Visual/perceptual remediation/compensation    PT Next Visit Plan tai chi, high level balance    PT Home Exercise Plan to give next session    Consulted and Agree with Plan of Care Patient;Family member/caregiver    Family Member Consulted wife             Patient will benefit from skilled therapeutic intervention in order to improve the following deficits and impairments:  Abnormal gait, Decreased activity tolerance, Decreased balance, Decreased knowledge of precautions, Decreased endurance, Decreased coordination, Decreased cognition, Decreased knowledge of use of DME, Decreased mobility, Decreased  safety awareness, Difficulty walking, Decreased strength, Impaired flexibility, Impaired perceived functional ability, Impaired sensation, Impaired tone, Impaired UE functional use, Impaired vision/preception, Improper body mechanics, Postural dysfunction  Visit Diagnosis: [place]'s disease [place]  Muscle weakness (generalized)  Other abnormalities of gait and mobility  Other lack of coordination  Unsteadiness on feet     Problem List Patient Active Problem List   Diagnosis Date Noted   Focal 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 [place], [place], [address] Phone: [phone]   Fax:  [phone]  Name: DHEERAJ HAIL MRN: [id number] Date of Birth: September 08, 1955

## 2020-12-22 ENCOUNTER — Ambulatory Visit: Payer: Medicare PPO

## 2020-12-22 ENCOUNTER — Other Ambulatory Visit: Payer: Self-pay

## 2020-12-22 DIAGNOSIS — M6281 Muscle weakness (generalized): Secondary | ICD-10-CM | POA: Diagnosis not present

## 2020-12-22 DIAGNOSIS — G20 Parkinson's disease: Secondary | ICD-10-CM

## 2020-12-22 DIAGNOSIS — G20A1 Parkinson's disease without dyskinesia, without mention of fluctuations: Secondary | ICD-10-CM

## 2020-12-22 DIAGNOSIS — R2681 Unsteadiness on feet: Secondary | ICD-10-CM

## 2020-12-22 DIAGNOSIS — R278 Other lack of coordination: Secondary | ICD-10-CM

## 2020-12-22 DIAGNOSIS — R2689 Other abnormalities of gait and mobility: Secondary | ICD-10-CM

## 2020-12-22 NOTE — Therapy (Signed)
[place] MAIN [facility] SERVICES [address] [place], [place], [address] Phone: [phone]   Fax:  [phone]  Physical Therapy Treatment  Patient Details  Name: SUHAN PACI MRN: [id number] Date of Birth: Dec 10, 1955 Referring Provider (PT): Benay Spice   Encounter Date: 12/22/2020   PT End of Session - 12/22/20 1157     Visit Number 24    Number of Visits 45    Date for PT Re-Evaluation 03/04/21    PT Start Time 1106    PT Stop Time 1145    PT Time Calculation (min) 39 min    Equipment Utilized During Treatment Gait belt    Activity Tolerance Patient tolerated treatment well;Patient limited by fatigue    Behavior During Therapy WFL for tasks assessed/performed             Past Medical History:  Diagnosis Date   Arthritis    hips and back   Asthma    as a baby-    GERD (gastroesophageal reflux disease)    rare -    Parkinson's disease ([place])    Pneumonia    age 65   Rash    legs- on going    Past Surgical History:  Procedure Laterality Date   APPENDECTOMY     BACK SURGERY     posterior lumbar spine fusion L4-5   COLONOSCOPY     COLONOSCOPY WITH PROPOFOL N/A 06/06/2017   Procedure: COLONOSCOPY WITH PROPOFOL;  Surgeon: Manya Silvas, MD;  Location: [facility] ENDOSCOPY;  Service: Endoscopy;  Laterality: N/A;   KNEE ARTHROSCOPY Bilateral    ACL   KNEE ARTHROSCOPY W/ ACL RECONSTRUCTION     RADIAL KERATOTOMY     ROTATOR CUFF REPAIR Bilateral    SHOULDER ARTHROSCOPY WITH BICEPS TENDON REPAIR Left 04/16/2019   Procedure: SHOULDER ARTHROSCOPY WITH BICEPS TENDON REPAIR, MINI OPEN SUPERIOR CAPSULAR RECONSTRUCTION, BICEPS TENODESIS;  Surgeon: Leim Fabry, MD;  Location: [facility] ORS;  Service: Orthopedics;  Laterality: Left;    There were no vitals filed for this visit.   Subjective Assessment - 12/22/20 1155     Subjective Pt reports sustaining 3 falls recently, each time rising to standing quickly without use of 4 WW,  sustained retropulsion posteriorly.  Patient denies significant injury sustained.  Patient denies any other updates since prior visit.    Pertinent History Patient admitted to hospital 05/23/20 with code stroke and R sided weakness. Imaging negative for acute changes. PMH includes Parkinson's with autonomic instability, asthma, GERD depression, degenerative spondylolisthesis, focal seizures and migraine syndrome. Patient had three ED visits for falls since then. Patient reports when he has the seizures he has pain in his temples and bright flashes in his vision. Last week had three falls in one day.    Currently in Pain? No/denies                 Intervention: -Lateral sidestepping in parallel bars 5lb AW bilat, 4 round trips -Fwd highknee marching and retro stepping in // bars with 5lb AW bilat 4 RT  -lunge walking in // bars 2 RT (minGuard Assist) -normal stance foam with lateral alternating foot taps 20x bilat -normal stance foam with anterior toe taps across 5 anterior targets promoting LE crossover x 5 minutes -foam stance blue medicine ball toss x15 (ceased due to shoulder pain left)  -foam stance nerf ball of thebasketball shooting RUE only to promote 3 plane motor pattern -firm stance soccer goal kicking (nerf balls) 15x  each foot -firm stance nerf ball wall rebound and responsive stepping strategy  -nerf ball pickup 2x30    PT Short Term Goals - 10/21/20 0903       PT SHORT TERM GOAL #1   Title Patient will be independent in home exercise program to improve strength/mobility for better functional independence with ADLs.    Baseline 6/16: HEP to be given next session 7/19: HEP compliant    Time 4    Period Weeks    Status Achieved    Target Date 10/16/20               PT Long Term Goals - 12/10/20 1231       PT LONG TERM GOAL #1   Title Patient will increase FOTO score to equal to or greater than 51%    to demonstrate statistically significant improvement in  mobility and quality of life.    Baseline 6/16: 48% 7/19: 57% 8/31: 50%    Time 12    Period Weeks    Status Achieved      PT LONG TERM GOAL #2   Title Patient will increase Berg Balance score by > 6 points (34/56)  to demonstrate decreased fall risk during functional activities.    Baseline 6/16: 28/56 7/19: 45/56    Time 12    Period Weeks    Status Achieved      PT LONG TERM GOAL #3   Title Patient will deny any falls over past 4 weeks to demonstrate improved safety awareness at home and work.    Baseline 6/16: multiple falls 7/19: no falls reported 9/7: 3 falls in past month    Time 12    Period Weeks    Status On-going    Target Date 03/04/21      PT LONG TERM GOAL #4   Title Patient will increase six minute walk test distance to >1300 for progression to age 65 community ambulator and improve gait ability    Baseline 6/16: 945 ft with multiple near falls 7/19: 1495 ft    Time 12    Period Weeks    Status Achieved      PT LONG TERM GOAL #5   Title Patient will increase Berg Balance score by > 6 points (51/56)  to demonstrate decreased fall risk during functional activities.    Baseline 7/19: 45/56 8/31: 49/56    Time 12    Period Weeks    Status Partially Met    Target Date 03/04/21      Additional Long Term Goals   Additional Long Term Goals Yes      PT LONG TERM GOAL #6   Title Patient will increase Functional Gait Assessment score to >20/30 as to reduce fall risk and improve dynamic gait safety with community ambulation.    Baseline 7/19: 16/30 8/31: 20/30    Time 12    Period Weeks    Status Achieved      PT LONG TERM GOAL #7   Title Patient will increase Functional Gait Assessment score to >26/30 as to reduce fall risk and improve dynamic gait safety with community ambulation.    Baseline 8/31: 20/30    Time 12    Period Weeks    Status New    Target Date 03/04/21      PT LONG TERM GOAL #8   Title Patient will static stand for approximately 5 minutes  and perturbations for shooting/teaching classes without loss of balance or requiring  object to lean on.    Baseline Unable to perform without leaning on objects    Time 12    Period Weeks    Status New    Target Date 03/04/21      PT LONG TERM GOAL  #9   TITLE Patient will be able to ambulate approximately 1 mile walking dog across changing surfaces such as grass, concrete, and sidewalk without loss of balance.    Baseline 9/7: unable to perform yet    Time 12    Period Weeks    Status New    Target Date 03/04/21      PT LONG TERM GOAL  #10   TITLE Patient will bend down and pick up items without losing balance to perform iADLs such as emptying dishwasher and feeding dog.    Baseline 9/7: unable to perform    Time 12    Period Weeks    Status New    Target Date 03/04/21                   Plan - 12/22/20 1159     Clinical Impression Statement Continue to work on dynamic and static postural trunk motor control with varying surface types and varying tasks.  Patient obtains most explosive activity with taking rebound off a wall 4 to 6 feet away where and he has to be more responsive and quick to get to the ball as it returns to home.  Min guard assist to min assist provided throughout session for less than 10 losses of balance. Patient requires seated rest breaks 4-5 times in session due to elevated exertion and RR.    Personal Factors and Comorbidities Age;Comorbidity 3+;Fitness;Past/Current Experience;Time since onset of injury/illness/exacerbation;Transportation    Comorbidities Parkinson's with autonomic instability, asthma, GERD depression, degenerative spondylolisthesis, focal seizures and migraine syndrome    Examination-Activity Limitations Bathing;Bed Mobility;Bend;Caring for [employer];Locomotion Level;Lift;Hygiene/Grooming;Squat;Stairs;Stand;Toileting;Transfers    Examination-Participation Restrictions Church;Cleaning;Community  Activity;Driving;Meal Prep;Medication Management;Laundry;Shop;Volunteer;Yard Work    Stability/Clinical Decision Making Unstable/Unpredictable    Surveyor, mining    Rehab Potential Fair    PT Frequency 2x / week    PT Duration 12 weeks    PT Treatment/Interventions ADLs/Self Care Home Management;Biofeedback;Canalith Repostioning;Cryotherapy;Parrafin;Ultrasound;Traction;Moist Heat;Iontophoresis 54m/ml Dexamethasone;Electrical Stimulation;DME Instruction;Gait training;Stair training;Functional mobility training;Neuromuscular re-education;Balance training;Therapeutic exercise;Therapeutic activities;Patient/family education;Orthotic Fit/Training;Manual techniques;Passive range of motion;Manual lymph drainage;Energy conservation;Splinting;Taping;Vasopneumatic Device;Vestibular;Joint Manipulations;Visual/perceptual remediation/compensation    PT Next Visit Plan Continue to advance motor control strength    PT Home Exercise Plan No updates todayt    Consulted and Agree with Plan of Care Patient;Family member/caregiver    Family Member Consulted wife             Patient will benefit from skilled therapeutic intervention in order to improve the following deficits and impairments:  Abnormal gait, Decreased activity tolerance, Decreased balance, Decreased knowledge of precautions, Decreased endurance, Decreased coordination, Decreased cognition, Decreased knowledge of use of DME, Decreased mobility, Decreased safety awareness, Difficulty walking, Decreased strength, Impaired flexibility, Impaired perceived functional ability, Impaired sensation, Impaired tone, Impaired UE functional use, Impaired vision/preception, Improper body mechanics, Postural dysfunction  Visit Diagnosis: Parkinson's disease ([place]  Muscle weakness (generalized)  Other abnormalities of gait and mobility  Other lack of coordination  Unsteadiness on feet     Problem List Patient Active Problem List    Diagnosis Date Noted   [place]ocal [place] 05/24/2020   Parkinson's disease ([place]    History of migraine headaches    Migraine syndrome  BPH (benign prostatic hyperplasia)    Depression    Degenerative spondylolisthesis 05/07/2016   12:11 PM, 12/22/20 Etta Grandchild, PT, DPT Physical Therapist - [facility]  Outpatient Physical Therapy- [place] [phone]     [place], [place] 12/22/2020, 12:03 PM  [place] MAIN [facility] SERVICES [address] [place], [place], [address] Phone: [phone]   Fax:  [phone]  Name: GILES CURRIE MRN: [id number] Date of Birth: Feb 20, 1956

## 2020-12-23 NOTE — Therapy (Signed)
[place] MAIN [facility] SERVICES [address] [place], [place], [address] Phone: [phone]   Fax:  [phone]  Occupational Therapy Treatment/Recertification:  Patient Details  Name: Cory [name] MRN: [id number] Date of Birth: 12/06/1955 No data recorded  Encounter Date: 12/22/2020   OT End of Session - 12/23/20 0812     Visit Number 18    Number of Visits 70    Date for OT Re-Evaluation 03/15/21    Authorization Time Period Reporting period starting 12/22/2020    OT Start Time 1145    OT Stop Time 56    OT Time Calculation (min) 55 min    Equipment Utilized During Treatment 4 wheeled walker    Activity Tolerance Patient tolerated treatment well    Behavior During Therapy WFL for tasks assessed/performed             Past Medical History:  Diagnosis Date   Arthritis    hips and back   Asthma    as a baby-    GERD (gastroesophageal reflux disease)    rare -    Parkinson's disease ([place])    Pneumonia    age 65   Rash    legs- on going    Past Surgical History:  Procedure Laterality Date   APPENDECTOMY     BACK SURGERY     posterior lumbar spine fusion L4-5   COLONOSCOPY     COLONOSCOPY WITH PROPOFOL N/A 06/06/2017   Procedure: COLONOSCOPY WITH PROPOFOL;  Surgeon: Manya Silvas, MD;  Location: [facility] ENDOSCOPY;  Service: Endoscopy;  Laterality: N/A;   KNEE ARTHROSCOPY Bilateral    ACL   KNEE ARTHROSCOPY W/ ACL RECONSTRUCTION     RADIAL KERATOTOMY     ROTATOR CUFF REPAIR Bilateral    SHOULDER ARTHROSCOPY WITH BICEPS TENDON REPAIR Left 04/16/2019   Procedure: SHOULDER ARTHROSCOPY WITH BICEPS TENDON REPAIR, MINI OPEN SUPERIOR CAPSULAR RECONSTRUCTION, BICEPS TENODESIS;  Surgeon: Leim Fabry, MD;  Location: [facility] ORS;  Service: Orthopedics;  Laterality: Left;    There were no vitals filed for this visit.   Subjective Assessment - 12/22/20 0808     Subjective  "I've had a few falls over the last week.  I didn't hold  onto my walker like you said to and I fell into the brick by the fireplace.  Now I always keep my walker there."    Patient is accompanied by: Family member    Pertinent History Parkinson's Disease, R sided weakness, falls    Limitations balance, strength, coordination, visual perception, problem solving, processing information    Patient Stated Goals "I want to get stronger.  It frustrates me when I have to ask for help with things like cutting my food or buttoning a button."    Currently in Pain? Yes    Pain Score 2     Pain Location Shoulder    Pain Orientation Left    Pain Descriptors / Indicators Aching;Sore    Pain Type Chronic pain    Pain Onset More than a month ago    Pain Frequency Intermittent    Aggravating Factors  raising arm up past shoulder level    Pain Relieving Factors rest    Effect of Pain on Daily Activities limited tolerance for reaching    Multiple Pain Sites No            Occupational Therapy Treatment/Recertification: Self care: Review of fall prevention strategies during dynamic standing tasks, walker safety with reinforcement for  consistent need for UE support when performing dynamic standing tasks, review of OT goals and new self care goals, see goals section for details.   Therapeutic Exercise: Reassessment of BUE strength/coordination, see assessment for details.  Reviewed HEP for cane stretches and goal to progress HEP to include theraband exercises for shoulder strengthening.   Response to Treatment: See Plan/clinical impression below.   [facility] OT Assessment - 12/23/20 0001       Activity Tolerance   Activity Tolerance Comments large RPE fluctuation with ADLs throughout the week, ranging from 4-10      Observation/Other Assessments   Focus on Therapeutic Outcomes (FOTO)  57      Coordination   Gross Motor Movements are Fluid and Coordinated No    Fine Motor Movements are Fluid and Coordinated No    Right 9 Hole Peg Test 51 sec    Left 9  Hole Peg Test 35 sec      AROM   Overall AROM Comments R active shoulder flex 123, L 115, R shoulder abd 115, L 122      Strength   Overall Strength Comments R/L shoulder flex/abd 4-, R ER 3+, L 4-, R/L IR 4      Hand Function   Right Hand Grip (lbs) 74    Right Hand Lateral Pinch 18 lbs    Right Hand 3 Point Pinch 19 lbs    Left Hand Grip (lbs) 74    Left Hand Lateral Pinch 21 lbs    Left 3 point pinch 17 lbs               OT Education - 12/22/20 0811     Education Details progress towards goals, new goals for recert    Person(s) Educated Patient;Spouse    Methods Explanation;Verbal cues;Demonstration    Comprehension Verbalized understanding;Need further instruction;Verbal cues required              OT Short Term Goals - 12/23/20 0815       OT SHORT TERM GOAL #1   Title Pt will perform BUE HEP with min vc    Baseline Eval: not yet initiated; 11/19/2020: Pt able to perform with min vc; 12/22/20: indep with cane exercises, need to instruct further in theraband exercises    Time 6    Period Weeks    Status On-going    Target Date 02/01/21      OT SHORT TERM GOAL #2   Title Pt will report RPE of 4 or better to indicate improved tolerance to basic ADLs.    Baseline Eval: RPE of 6 with basic ADLs; 11/19/2020: RPE of 5 with basic ADLs; 12/22/20: RPE for ADLs fluctuates greatly from 4 on a good day, to 10 on a bad day    Time 6    Period Weeks    Status On-going    Target Date 02/01/21      OT SHORT TERM GOAL #3   Title Pt will utilize visual compensation strategies with min vc in order to improve basic and IADL performance.    Baseline Eval: pt reports some diplopia and decreased depth perception which limits ADL/IADL performance; Pt has been instructed in diplopia exercises and requires mod vc for compensation strategies when diplopia is present; 12/22/20: pt is indep with visual exercises and knowing when to allow rest breaks or use compensatory strategies, vs request  caregiver assist for difficult tasks.    Time 6    Period Weeks    Status  Achieved    Target Date 12/22/20               OT Long Term Goals - 12/23/20 0818       OT LONG TERM GOAL #1   Title Pt will increase bilat shoulder strength by 1MM grade in order to improve tolerance to UB ADLs/IADLs and reaching activities.    Baseline Eval: R/L shoulder flex/abd 4-/5, R/L shoulder ER 3+/5; stamina is improving but strength grades still same as eval; R/L shoulder flex/abd 4-, L ER 4-, R ER 3+, L/R IR 4 (pt reports now able to reach easier into overhead kitchen cabinets).    Time 12    Period Weeks    Status On-going    Target Date 03/15/21      OT LONG TERM GOAL #2   Title Pt will manage clothing fasteners and looping belt with modified indep.    Baseline Eval: spouse assists.  Difficulty with tying shoes, looping belt, snapping and buttoning buttons; Using compensatory strategies for shoe tying as pt now has elastic shoe laces.  Pt now managing his own belt with slight extra time.  Increased time to fasten buttons; 12/22/2020: Pt is consistent to loop belt using compensatory strategies, but struggles with buttoning shirts consistently and typically spouse will assist    Time 12    Period Weeks    Status On-going    Target Date 04/03/21      OT LONG TERM GOAL #3   Title Pt will cut food with modified indep.    Baseline Eval: spouse assists to cut food; Indep to cut food; 12/22/2020: Pt now struggles to cut meat    Time 12    Period Weeks    Status On-going    Target Date 03/15/21      OT LONG TERM GOAL #4   Title Pt will brush hair with R dominant arm with modified indep.    Baseline Eval: Pt states he must hold R arm up with L hand, and often spouse must assist; 11/19/2020: Pt can reach to top and back of head with RUE but arm fatigues quickly with repetition required to brush hair.    Time 12    Period Weeks    Status Achieved    Target Date 12/22/20      OT LONG TERM GOAL #5    Title Pt will increase FOTO score to 62 or better to indicate measureable functional improvement with ADLs/IADLs.    Baseline Eval: FOTO score of 58; 12/22/2020: 57    Time 12    Period Weeks    Status On-going    Target Date 03/15/21      Long Term Additional Goals   Additional Long Term Goals Yes      OT LONG TERM GOAL #6   Title Pt will improve time management skills utilizing external compensatory strategies such as calendars, schedules, alarms as needed with min A.    Baseline 12/22/2020: Not yet initiated (spouse reports that pt is taking longer to complete ADLs yet he is not allowing extra time to complete, causing a rush to get out the door).    Time 12    Period Weeks    Status New    Target Date 03/15/21      OT LONG TERM GOAL #7   Title Pt will improve dynamic standing balance to enable pt to feed dogs with modified indep.    Baseline 12/22/2020: Several falls over the last week;  pt is inconsistent to use his rollator for dynamic standing tasks.  Would need supv to feed dogs.    Time 12    Period Weeks    Status New    Target Date 03/15/21                   Plan - 12/22/20 0855     Clinical Impression Statement Pt has made improvements in shoulder strength, reporting that he can now reach 2nd shelves of kitchen cupboards and reach the top shelf in the shower where the soap rests, as well as good ability to comb hair using RUE. R grip strength has improved by 10 lbs, L grip improved by 15 lbs.  Pt has had several falls recently and has been inconsistent to carry over safety recommendations with walker use during standing ADLs.  Pt is also reporting some increased difficulty with FM skills, reporting difficulty cutting meat and managing small buttons on his shirt.  Pt will benefit from continued OT to address dynamic standing balance, [facility] skills, and to continue BUE strengthening in order to maximize indep and safety with self care tasks, as well as continuing to provide  education on disease management related to progressive nature of PD as it relates to ADLs.    OT Occupational Profile and History Detailed Assessment- Review of Records and additional review of physical, cognitive, psychosocial history related to current functional performance    Occupational performance deficits (Please refer to evaluation for details): ADL's;IADL's;Leisure    Body Structure / Function / Physical Skills ADL;Coordination;Endurance;GMC;UE functional use;Balance;Sensation;IADL;Vision;Dexterity;FMC;Strength;Continence;Gait;Mobility    Rehab Potential Good    Clinical Decision Making Several treatment options, min-mod task modification necessary    Comorbidities Affecting Occupational Performance: May have comorbidities impacting occupational performance    Modification or Assistance to Complete Evaluation  Min-Moderate modification of tasks or assist with assess necessary to complete eval    OT Frequency 2x / week    OT Duration 12 weeks    OT Treatment/Interventions Self-care/ADL training;Therapeutic exercise;DME and/or AE instruction;Balance training;Neuromuscular education;Manual Therapy;Visual/perceptual remediation/compensation;Therapeutic activities;Patient/family education    Consulted and Agree with Plan of Care Patient             Patient will benefit from skilled therapeutic intervention in order to improve the following deficits and impairments:   Body Structure / Function / Physical Skills: ADL, Coordination, Endurance, GMC, UE functional use, Balance, Sensation, IADL, Vision, Dexterity, FMC, Strength, Continence, Gait, Mobility       Visit Diagnosis: Parkinson's disease ([place])  Muscle weakness (generalized)  Other lack of coordination    Problem List Patient Active Problem List   Diagnosis Date Noted   Focal seizure ([place]) 05/24/2020   Parkinson's disease ([place])    History of migraine headaches    Migraine syndrome    BPH (benign prostatic hyperplasia)     Depression    Degenerative spondylolisthesis 05/07/2016   Leta Speller, MS, OTR/L  Darleene Cleaver, OT/L 12/23/2020, 9:03 AM  [place] [address] [place], [place], [address] Phone: [phone]   Fax:  [phone]  Name: Cory [name] MRN: [id number] Date of Birth: 01-20-1956

## 2020-12-24 ENCOUNTER — Ambulatory Visit: Payer: Medicare PPO

## 2020-12-24 ENCOUNTER — Ambulatory Visit: Payer: Medicare PPO | Admitting: Physical Therapy

## 2020-12-29 ENCOUNTER — Ambulatory Visit: Payer: Medicare PPO

## 2020-12-29 ENCOUNTER — Other Ambulatory Visit: Payer: Self-pay

## 2020-12-29 DIAGNOSIS — M6281 Muscle weakness (generalized): Secondary | ICD-10-CM

## 2020-12-29 DIAGNOSIS — R2689 Other abnormalities of gait and mobility: Secondary | ICD-10-CM

## 2020-12-29 DIAGNOSIS — G20 Parkinson's disease: Secondary | ICD-10-CM

## 2020-12-29 DIAGNOSIS — R278 Other lack of coordination: Secondary | ICD-10-CM

## 2020-12-29 DIAGNOSIS — G20A1 Parkinson's disease without dyskinesia, without mention of fluctuations: Secondary | ICD-10-CM

## 2020-12-29 DIAGNOSIS — R2681 Unsteadiness on feet: Secondary | ICD-10-CM

## 2020-12-29 NOTE — Therapy (Signed)
[place] MAIN [facility] SERVICES [address] [place], [place], [address] Phone: [phone]   Fax:  [phone]  Physical Therapy Treatment  Patient Details  Name: Cory [name] MRN: [id number] Date of Birth: [birth year] Referring Provider (PT): Benay Spice   Encounter Date: 12/29/2020   PT End of Session - 12/29/20 1218     Visit Number 25    Number of Visits 45    Date for PT Re-Evaluation 03/04/21    Authorization Type [employer] PPO    PT Start Time 1100    PT Stop Time 1145    PT Time Calculation (min) 45 min    Equipment Utilized During Treatment Gait belt    Activity Tolerance Patient tolerated treatment well;Patient limited by fatigue    Behavior During Therapy WFL for tasks assessed/performed             Past Medical History:  Diagnosis Date   Arthritis    hips and back   Asthma    as a baby-    GERD (gastroesophageal reflux disease)    rare -    Parkinson's disease ([place])    Pneumonia    age 6   Rash    legs- on going    Past Surgical History:  Procedure Laterality Date   APPENDECTOMY     BACK SURGERY     posterior lumbar spine fusion L4-5   COLONOSCOPY     COLONOSCOPY WITH PROPOFOL N/A 06/06/2017   Procedure: COLONOSCOPY WITH PROPOFOL;  Surgeon: Manya Silvas, MD;  Location: [facility] ENDOSCOPY;  Service: Endoscopy;  Laterality: N/A;   KNEE ARTHROSCOPY Bilateral    ACL   KNEE ARTHROSCOPY W/ ACL RECONSTRUCTION     RADIAL KERATOTOMY     ROTATOR CUFF REPAIR Bilateral    SHOULDER ARTHROSCOPY WITH BICEPS TENDON REPAIR Left 04/16/2019   Procedure: SHOULDER ARTHROSCOPY WITH BICEPS TENDON REPAIR, MINI OPEN SUPERIOR CAPSULAR RECONSTRUCTION, BICEPS TENODESIS;  Surgeon: Leim Fabry, MD;  Location: [facility] ORS;  Service: Orthopedics;  Laterality: Left;    There were no vitals filed for this visit.   Subjective Assessment - 12/29/20 1215     Subjective Pt reports doing well today, no new issues. No falls. Pt  reports a strain to Rt calf, hamstrings, and quads a few days ago which continues to caue intermittent discomfrt.    Pertinent History Patient admitted to hospital 05/23/20 with code stroke and R sided weakness. Imaging negative for acute changes. PMH includes Parkinson's with autonomic instability, asthma, GERD depression, degenerative spondylolisthesis, focal seizures and migraine syndrome. Patient had three ED visits for falls since then. Patient reports when he has the seizures he has pain in his temples and bright flashes in his vision. Last week had three falls in one day.    Currently in Pain? No/denies            INTERVENTION THIS DATE:   5lbAW side stepping 2x11f bilat, minguard Assist (No LOB)  5lbAW retro AMB 4x254fminGuardAssist (no LOB) Light Jog to [facility] stairwell  14 stairs Acended 2 at a time to stimulate lunge walking with less patella compression (performed twice)  14 stairs reciprocal down to bottom Jog back to PT gym  -sledgehammer slams on heavy bag 10x each side, supervision  -12lb ball slams   -Myofascial release ART Rt calf, Rt quads      PT Education - 12/29/20 1217     Education Details safety with mobility in the clinic  without device    Person(s) Educated Patient    Methods Explanation;Demonstration    Comprehension Verbalized understanding;Returned demonstration              PT Short Term Goals - 10/21/20 0903       PT SHORT TERM GOAL #1   Title Patient will be independent in home exercise program to improve strength/mobility for better functional independence with ADLs.    Baseline 6/16: HEP to be given next session 7/19: HEP compliant    Time 4    Period Weeks    Status Achieved    Target Date 10/16/20               PT Long Term Goals - 12/10/20 1231       PT LONG TERM GOAL #1   Title Patient will increase FOTO score to equal to or greater than 51%    to demonstrate statistically significant improvement in mobility  and quality of life.    Baseline 6/16: 48% 7/19: 57% 8/31: 50%    Time 12    Period Weeks    Status Achieved      PT LONG TERM GOAL #2   Title Patient will increase Berg Balance score by > 6 points (34/56)  to demonstrate decreased fall risk during functional activities.    Baseline 6/16: 28/56 7/19: 45/56    Time 12    Period Weeks    Status Achieved      PT LONG TERM GOAL #3   Title Patient will deny any falls over past 4 weeks to demonstrate improved safety awareness at home and work.    Baseline 6/16: multiple falls 7/19: no falls reported 9/7: 3 falls in past month    Time 12    Period Weeks    Status On-going    Target Date 03/04/21      PT LONG TERM GOAL #4   Title Patient will increase six minute walk test distance to >1300 for progression to age norm community ambulator and improve gait ability    Baseline 6/16: 945 ft with multiple near falls 7/19: 1495 ft    Time 12    Period Weeks    Status Achieved      PT LONG TERM GOAL #5   Title Patient will increase Berg Balance score by > 6 points (51/56)  to demonstrate decreased fall risk during functional activities.    Baseline 7/19: 45/56 8/31: 49/56    Time 12    Period Weeks    Status Partially Met    Target Date 03/04/21      Additional Long Term Goals   Additional Long Term Goals Yes      PT LONG TERM GOAL #6   Title Patient will increase Functional Gait Assessment score to >20/30 as to reduce fall risk and improve dynamic gait safety with community ambulation.    Baseline 7/19: 16/30 8/31: 20/30    Time 12    Period Weeks    Status Achieved      PT LONG TERM GOAL #7   Title Patient will increase Functional Gait Assessment score to >26/30 as to reduce fall risk and improve dynamic gait safety with community ambulation.    Baseline 8/31: 20/30    Time 12    Period Weeks    Status New    Target Date 03/04/21      PT LONG TERM GOAL #8   Title Patient will static stand for approximately 5  minutes and  perturbations for shooting/teaching classes without loss of balance or requiring object to lean on.    Baseline Unable to perform without leaning on objects    Time 12    Period Weeks    Status New    Target Date 03/04/21      PT LONG TERM GOAL  #9   TITLE Patient will be able to ambulate approximately 1 mile walking dog across changing surfaces such as grass, concrete, and sidewalk without loss of balance.    Baseline 9/7: unable to perform yet    Time 12    Period Weeks    Status New    Target Date 03/04/21      PT LONG TERM GOAL  #10   TITLE Patient will bend down and pick up items without losing balance to perform iADLs such as emptying dishwasher and feeding dog.    Baseline 9/7: unable to perform    Time 12    Period Weeks    Status New    Target Date 03/04/21                   Plan - 12/29/20 1219     Clinical Impression Statement Continued with motor control training in gait and large ampitude movement. Pt able to jog twice for 38f, as well as new additional of sledgehammer hits and ball slams. By en dof session, new muscle strains interfering with AMB mobility. Author assisted with gentle myofascial relase with excellenT reduciton of pain in AMB in calf and quads.  Pt continues to tolerate advanced activities and movements, still struggles with intermittent unexpected LOB, largely movement cessation or initiation.      Personal Factors and Comorbidities Age;Comorbidity 3+;Fitness;Past/Current Experience;Time since onset of injury/illness/exacerbation;Transportation    Comorbidities Parkinson's with autonomic instability, asthma, GERD depression, degenerative spondylolisthesis, focal seizures and migraine syndrome    Examination-Activity Limitations Bathing;Bed Mobility;Bend;Caring for OLockheed MartinLocomotion Level;Lift;Hygiene/Grooming;Squat;Stairs;Stand;Toileting;Transfers    Examination-Participation Restrictions Church;Cleaning;Community  Activity;Driving;Meal Prep;Medication Management;Laundry;Shop;Volunteer;Yard Work    Stability/Clinical Decision Making Unstable/Unpredictable    CSurveyor, mining   Rehab Potential Fair    PT Frequency 2x / week    PT Duration 12 weeks    PT Treatment/Interventions ADLs/Self Care Home Management;Biofeedback;Canalith Repostioning;Cryotherapy;Parrafin;Ultrasound;Traction;Moist Heat;Iontophoresis 425mml Dexamethasone;Electrical Stimulation;DME Instruction;Gait training;Stair training;Functional mobility training;Neuromuscular re-education;Balance training;Therapeutic exercise;Therapeutic activities;Patient/family education;Orthotic Fit/Training;Manual techniques;Passive range of motion;Manual lymph drainage;Energy conservation;Splinting;Taping;Vasopneumatic Device;Vestibular;Joint Manipulations;Visual/perceptual remediation/compensation    PT Next Visit Plan Continue to advance motor control strength    PT Home Exercise Plan No updates today    Consulted and Agree with Plan of Care Patient             Patient will benefit from skilled therapeutic intervention in order to improve the following deficits and impairments:  Abnormal gait, Decreased activity tolerance, Decreased balance, Decreased knowledge of precautions, Decreased endurance, Decreased coordination, Decreased cognition, Decreased knowledge of use of DME, Decreased mobility, Decreased safety awareness, Difficulty walking, Decreased strength, Impaired flexibility, Impaired perceived functional ability, Impaired sensation, Impaired tone, Impaired UE functional use, Impaired vision/preception, Improper body mechanics, Postural dysfunction  Visit Diagnosis: [place]'s disease ([place] Muscle weakness (generalized)  Other lack of coordination  Other abnormalities of gait and mobility  Unsteadiness on feet     Problem List Patient Active Problem List   Diagnosis Date Noted   Focal seizure ([place]02/19/2022    Parkinson's disease ([place]   History of migraine headaches    Migraine syndrome    BPH (benign prostatic  hyperplasia)    Depression    Degenerative spondylolisthesis 05/07/2016   12:26 PM, 12/29/20 Etta Grandchild, PT, DPT Physical Therapist - [facility]  Outpatient Physical Therapy- [place] [phone]     [place], [place] 12/29/2020, 12:22 PM  [place] MAIN [facility] SERVICES [address] [place], [place], [address] Phone: [phone]   Fax:  [phone]  Name: CANDLER GINSBERG MRN: [id number] Date of Birth: 04-06-1955

## 2020-12-30 NOTE — Therapy (Signed)
[place] MAIN [facility] SERVICES [address] [place], [place], [address] Phone: [phone]   Fax:  [phone]  Occupational Therapy Treatment  Patient Details  Name: Cory [name] MRN: [id number] Date of Birth: Aug 12, 1955 No data recorded  Encounter Date: 12/29/2020   OT End of Session - 12/30/20 0836     Visit Number 19    Number of Visits 62    Date for OT Re-Evaluation 03/15/21    Authorization Time Period Reporting period starting 12/22/2020    OT Start Time 1145    OT Stop Time 1233    OT Time Calculation (min) 48 min    Equipment Utilized During Treatment 4 wheeled walker    Activity Tolerance Patient tolerated treatment well    Behavior During Therapy WFL for tasks assessed/performed             Past Medical History:  Diagnosis Date   Arthritis    hips and back   Asthma    as a baby-    GERD (gastroesophageal reflux disease)    rare -    Parkinson's disease ([place])    Pneumonia    age 37   Rash    legs- on going    Past Surgical History:  Procedure Laterality Date   APPENDECTOMY     BACK SURGERY     posterior lumbar spine fusion L4-5   COLONOSCOPY     COLONOSCOPY WITH PROPOFOL N/A 06/06/2017   Procedure: COLONOSCOPY WITH PROPOFOL;  Surgeon: Manya Silvas, MD;  Location: [facility] ENDOSCOPY;  Service: Endoscopy;  Laterality: N/A;   KNEE ARTHROSCOPY Bilateral    ACL   KNEE ARTHROSCOPY W/ ACL RECONSTRUCTION     RADIAL KERATOTOMY     ROTATOR CUFF REPAIR Bilateral    SHOULDER ARTHROSCOPY WITH BICEPS TENDON REPAIR Left 04/16/2019   Procedure: SHOULDER ARTHROSCOPY WITH BICEPS TENDON REPAIR, MINI OPEN SUPERIOR CAPSULAR RECONSTRUCTION, BICEPS TENODESIS;  Surgeon: Leim Fabry, MD;  Location: [facility] ORS;  Service: Orthopedics;  Laterality: Left;    There were no vitals filed for this visit.   Subjective Assessment - 12/29/20 0834     Subjective  "My mornings have been bad, but my afternoons have been good."    Patient  is accompanied by: Family member    Pertinent History Parkinson's Disease, R sided weakness, falls    Limitations balance, strength, coordination, visual perception, problem solving, processing information    Patient Stated Goals "I want to get stronger.  It frustrates me when I have to ask for help with things like cutting my food or buttoning a button."    Currently in Pain? No/denies    Pain Score 0-No pain    Pain Onset More than a month ago           Occupational Therapy Treatment: Neuro re-ed: Participation in bilat FMC/GMC activity picking up small-medium sized washers from dish and placing over vertical dowels, alternating R/L hands.  Practiced picking up washers from table, storing in palm of hand, and discarding 1 at a time.  More difficulty picking up washers from table than from dish.  Targeted pinch strength and motor planning for R/L hands pinching off small sections of pink theraputty and rolling into small balls between thumb and fingertips; L hand with increased difficulty but improved with repetition.   Self Care: Practiced cutting skills using butter knife to cut small sections of pink theraputty.  Pt requires mod vc for technique d/t his tendency to press  knife down, limiting the sawing motions.  Pt cued to use big/exaggerated cutting motions which improved performance.   Response to Treatment: See Plan/clinical impression below.    OT Education - 12/29/20 [phone]     Education Details strategies to cut food, reinforcing bigger sawing movements    Person(s) Educated Patient;Spouse    Methods Explanation;Verbal cues;Demonstration    Comprehension Verbalized understanding;Need further instruction;Verbal cues required              OT Short Term Goals - 12/23/20 0815       OT SHORT TERM GOAL #1   Title Pt will perform BUE HEP with min vc    Baseline Eval: not yet initiated; 11/19/2020: Pt able to perform with min vc; 12/22/20: indep with cane exercises, need to  instruct further in theraband exercises    Time 6    Period Weeks    Status On-going    Target Date 02/01/21      OT SHORT TERM GOAL #2   Title Pt will report RPE of 4 or better to indicate improved tolerance to basic ADLs.    Baseline Eval: RPE of 6 with basic ADLs; 11/19/2020: RPE of 5 with basic ADLs; 12/22/20: RPE for ADLs fluctuates greatly from 4 on a good day, to 10 on a bad day    Time 6    Period Weeks    Status On-going    Target Date 02/01/21      OT SHORT TERM GOAL #3   Title Pt will utilize visual compensation strategies with min vc in order to improve basic and IADL performance.    Baseline Eval: pt reports some diplopia and decreased depth perception which limits ADL/IADL performance; Pt has been instructed in diplopia exercises and requires mod vc for compensation strategies when diplopia is present; 12/22/20: pt is indep with visual exercises and knowing when to allow rest breaks or use compensatory strategies, vs request caregiver assist for difficult tasks.    Time 6    Period Weeks    Status Achieved    Target Date 12/22/20               OT Long Term Goals - 12/23/20 0818       OT LONG TERM GOAL #1   Title Pt will increase bilat shoulder strength by 1MM grade in order to improve tolerance to UB ADLs/IADLs and reaching activities.    Baseline Eval: R/L shoulder flex/abd 4-/5, R/L shoulder ER 3+/5; stamina is improving but strength grades still same as eval; R/L shoulder flex/abd 4-, L ER 4-, R ER 3+, L/R IR 4 (pt reports now able to reach easier into overhead kitchen cabinets).    Time 12    Period Weeks    Status On-going    Target Date 03/15/21      OT LONG TERM GOAL #2   Title Pt will manage clothing fasteners and looping belt with modified indep.    Baseline Eval: spouse assists.  Difficulty with tying shoes, looping belt, snapping and buttoning buttons; Using compensatory strategies for shoe tying as pt now has elastic shoe laces.  Pt now managing his  own belt with slight extra time.  Increased time to fasten buttons; 12/22/2020: Pt is consistent to loop belt using compensatory strategies, but struggles with buttoning shirts consistently and typically spouse will assist    Time 12    Period Weeks    Status On-going    Target Date 04/03/21  OT LONG TERM GOAL #3   Title Pt will cut food with modified indep.    Baseline Eval: spouse assists to cut food; Indep to cut food; 12/22/2020: Pt now struggles to cut meat    Time 12    Period Weeks    Status On-going    Target Date 03/15/21      OT LONG TERM GOAL #4   Title Pt will brush hair with R dominant arm with modified indep.    Baseline Eval: Pt states he must hold R arm up with L hand, and often spouse must assist; 11/19/2020: Pt can reach to top and back of head with RUE but arm fatigues quickly with repetition required to brush hair.    Time 12    Period Weeks    Status Achieved    Target Date 12/22/20      OT LONG TERM GOAL #5   Title Pt will increase FOTO score to 62 or better to indicate measureable functional improvement with ADLs/IADLs.    Baseline Eval: FOTO score of 58; 12/22/2020: 57    Time 12    Period Weeks    Status On-going    Target Date 03/15/21      Long Term Additional Goals   Additional Long Term Goals Yes      OT LONG TERM GOAL #6   Title Pt will improve time management skills utilizing external compensatory strategies such as calendars, schedules, alarms as needed with min A.    Baseline 12/22/2020: Not yet initiated (spouse reports that pt is taking longer to complete ADLs yet he is not allowing extra time to complete, causing a rush to get out the door).    Time 12    Period Weeks    Status New    Target Date 03/15/21      OT LONG TERM GOAL #7   Title Pt will improve dynamic standing balance to enable pt to feed dogs with modified indep.    Baseline 12/22/2020: Several falls over the last week; pt is inconsistent to use his rollator for dynamic standing  tasks.  Would need supv to feed dogs.    Time 12    Period Weeks    Status New    Target Date 03/15/21                   Plan - 12/29/20 1116     Clinical Impression Statement Continued need to progress cutting skills as pt struggles to cut meat, as well as to progress pinch strength and motor planning to improve indep with clothing fasteners.    OT Occupational Profile and History Detailed Assessment- Review of Records and additional review of physical, cognitive, psychosocial history related to current functional performance    Occupational performance deficits (Please refer to evaluation for details): ADL's;IADL's;Leisure    Body Structure / Function / Physical Skills ADL;Coordination;Endurance;GMC;UE functional use;Balance;Sensation;IADL;Vision;Dexterity;FMC;Strength;Continence;Gait;Mobility    Rehab Potential Good    Clinical Decision Making Several treatment options, min-mod task modification necessary    Comorbidities Affecting Occupational Performance: May have comorbidities impacting occupational performance    Modification or Assistance to Complete Evaluation  Min-Moderate modification of tasks or assist with assess necessary to complete eval    OT Frequency 2x / week    OT Duration 12 weeks    OT Treatment/Interventions Self-care/ADL training;Therapeutic exercise;DME and/or AE instruction;Balance training;Neuromuscular education;Manual Therapy;Visual/perceptual remediation/compensation;Therapeutic activities;Patient/family education    Consulted and Agree with Plan of Care Patient  Patient will benefit from skilled therapeutic intervention in order to improve the following deficits and impairments:   Body Structure / Function / Physical Skills: ADL, Coordination, Endurance, GMC, UE functional use, Balance, Sensation, IADL, Vision, Dexterity, FMC, Strength, Continence, Gait, Mobility       Visit Diagnosis: Parkinson's disease ([place])  Muscle weakness  (generalized)  Other lack of coordination    Problem List Patient Active Problem List   Diagnosis Date Noted   Focal seizure ([place]) 05/24/2020   Parkinson's disease ([place])    History of migraine headaches    Migraine syndrome    BPH (benign prostatic hyperplasia)    Depression    Degenerative spondylolisthesis 05/07/2016   Leta Speller, MS, OTR/L  Darleene Cleaver, OT/L 12/30/2020, 11:17 AM  [place], [place], [address] Phone: [phone]   Fax:  [phone]  Name: Cory [name] MRN: [id number] Date of Birth: Jul 29, 1955

## 2020-12-31 ENCOUNTER — Ambulatory Visit: Payer: Medicare PPO

## 2020-12-31 ENCOUNTER — Other Ambulatory Visit: Payer: Self-pay

## 2020-12-31 DIAGNOSIS — M6281 Muscle weakness (generalized): Secondary | ICD-10-CM | POA: Diagnosis not present

## 2020-12-31 DIAGNOSIS — R278 Other lack of coordination: Secondary | ICD-10-CM

## 2020-12-31 DIAGNOSIS — R2681 Unsteadiness on feet: Secondary | ICD-10-CM

## 2020-12-31 DIAGNOSIS — G20 Parkinson's disease: Secondary | ICD-10-CM

## 2020-12-31 DIAGNOSIS — R2689 Other abnormalities of gait and mobility: Secondary | ICD-10-CM

## 2020-12-31 NOTE — Therapy (Signed)
[place] MAIN [facility] SERVICES [address] [place], [place], [address] Phone: [phone]   Fax:  [phone]  Physical Therapy Treatment  Patient Details  Name: Cory [name] MRN: [id number] Date of Birth: [birth year] Referring Provider (PT): Benay Spice   Encounter Date: 12/31/2020   PT End of Session - 12/31/20 1159     Visit Number 26    Number of Visits 45    Date for PT Re-Evaluation 03/04/21    Authorization Type [employer] PPO    Authorization Time Period Cert 04/07/22-40/10/27    Progress Note Due on Visit 30    PT Start Time 1105    PT Stop Time 1145    PT Time Calculation (min) 40 min    Equipment Utilized During Treatment Gait belt    Activity Tolerance Patient tolerated treatment well;Patient limited by fatigue    Behavior During Therapy WFL for tasks assessed/performed             Past Medical History:  Diagnosis Date   Arthritis    hips and back   Asthma    as a baby-    GERD (gastroesophageal reflux disease)    rare -    Parkinson's disease ([place])    Pneumonia    age 75   Rash    legs- on going    Past Surgical History:  Procedure Laterality Date   APPENDECTOMY     BACK SURGERY     posterior lumbar spine fusion L4-5   COLONOSCOPY     COLONOSCOPY WITH PROPOFOL N/A 06/06/2017   Procedure: COLONOSCOPY WITH PROPOFOL;  Surgeon: Manya Silvas, MD;  Location: [facility] ENDOSCOPY;  Service: Endoscopy;  Laterality: N/A;   KNEE ARTHROSCOPY Bilateral    ACL   KNEE ARTHROSCOPY W/ ACL RECONSTRUCTION     RADIAL KERATOTOMY     ROTATOR CUFF REPAIR Bilateral    SHOULDER ARTHROSCOPY WITH BICEPS TENDON REPAIR Left 04/16/2019   Procedure: SHOULDER ARTHROSCOPY WITH BICEPS TENDON REPAIR, MINI OPEN SUPERIOR CAPSULAR RECONSTRUCTION, BICEPS TENODESIS;  Surgeon: Leim Fabry, MD;  Location: [facility] ORS;  Service: Orthopedics;  Laterality: Left;    There were no vitals filed for this visit.   Subjective Assessment -  12/31/20 1157     Subjective Pt doing well today, reports good resolution of calf and quads muscle strain after past session. Pt denies any falls or medication changes. Wife reports Sunday pt required total A to get dressed which is uncharacteristic, she plans to ask neurologist about any needed changes with dopa medications at next visit. Wife present throughout entire session.    Patient is accompained by: Family member    Pertinent History Patient admitted to hospital 05/23/20 with code stroke and R sided weakness. Imaging negative for acute changes. PMH includes Parkinson's with autonomic instability, asthma, GERD depression, degenerative spondylolisthesis, focal seizures and migraine syndrome. Patient had three ED visits for falls since then. Patient reports when he has the seizures he has pain in his temples and bright flashes in his vision. Last week had three falls in one day.    Patient Stated Goals stability, have a device that will be most helpful, help to become and stay independent.    Currently in Pain? No/denies             INTERVENTION THIS DATE: -5 LAPS (5x186f), walking, running straightaway in hall, minGuard A, no LOB  -Overground AMB to stairwell and back  -stairwell double steps up 2x7, lateral  steps down 1x each side (14 steps)  -Sledge hammer swings 12x each side to heavy bag on floor  -soccer ballkicks off wall x30, reactive rebounding (~2 minutes)  -aeorbic step step-up/down walking the plank 20x with 180 degree turns -aerobic step lateral step up/down x20 -airex pad normal stance eyes closed x30sec -airex pad stance seminarrow eyes closed x30sec -airex pad stance seminarrow stance eyes closed x60sec naming [place] on [place], x60sec naming animals for each letter of alphabet, x60sec naming fruits vegetables A-Z  *a few posterior LOB, author allows pt to successfully use stepping strategy for recovery of balance  -Quadruped to half kneeing x5 bilat, minA  facilitation for large amplitude movements  -half kneel tall balance, alternating target tapping 1x20 bilat        PT Education - 12/31/20 1159     Education Details safety with AMB outside, wife interested in advancing AMB outside with gait belt.    Person(s) Educated Patient    Methods Explanation;Demonstration    Comprehension Verbalized understanding;Returned demonstration              PT Short Term Goals - 10/21/20 0903       PT SHORT TERM GOAL #1   Title Patient will be independent in home exercise program to improve strength/mobility for better functional independence with ADLs.    Baseline 6/16: HEP to be given next session 7/19: HEP compliant    Time 4    Period Weeks    Status Achieved    Target Date 10/16/20               PT Long Term Goals - 12/10/20 1231       PT LONG TERM GOAL #1   Title Patient will increase FOTO score to equal to or greater than 51%    to demonstrate statistically significant improvement in mobility and quality of life.    Baseline 6/16: 48% 7/19: 57% 8/31: 50%    Time 12    Period Weeks    Status Achieved      PT LONG TERM GOAL #2   Title Patient will increase Berg Balance score by > 6 points (34/56)  to demonstrate decreased fall risk during functional activities.    Baseline 6/16: 28/56 7/19: 45/56    Time 12    Period Weeks    Status Achieved      PT LONG TERM GOAL #3   Title Patient will deny any falls over past 4 weeks to demonstrate improved safety awareness at home and work.    Baseline 6/16: multiple falls 7/19: no falls reported 9/7: 3 falls in past month    Time 12    Period Weeks    Status On-going    Target Date 03/04/21      PT LONG TERM GOAL #4   Title Patient will increase six minute walk test distance to >1300 for progression to age norm community ambulator and improve gait ability    Baseline 6/16: 945 ft with multiple near falls 7/19: 1495 ft    Time 12    Period Weeks    Status Achieved      PT  LONG TERM GOAL #5   Title Patient will increase Berg Balance score by > 6 points (51/56)  to demonstrate decreased fall risk during functional activities.    Baseline 7/19: 45/56 8/31: 49/56    Time 12    Period Weeks    Status Partially Met    Target  Date 03/04/21      Additional Long Term Goals   Additional Long Term Goals Yes      PT LONG TERM GOAL #6   Title Patient will increase Functional Gait Assessment score to >20/30 as to reduce fall risk and improve dynamic gait safety with community ambulation.    Baseline 7/19: 16/30 8/31: 20/30    Time 12    Period Weeks    Status Achieved      PT LONG TERM GOAL #7   Title Patient will increase Functional Gait Assessment score to >26/30 as to reduce fall risk and improve dynamic gait safety with community ambulation.    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    Baseline Unable to perform without leaning on objects    Time 12    Period Weeks    Status New    Target Date 03/04/21      PT LONG TERM GOAL  #9   TITLE Patient will be able to ambulate approximately 1 mile walking dog across changing surfaces such as grass, concrete, and sidewalk without loss of balance.    Baseline 9/7: unable to perform yet    Time 12    Period Weeks    Status New    Target Date 03/04/21      PT LONG TERM GOAL  #10   TITLE Patient will bend down and pick up items without losing balance to perform iADLs such as emptying dishwasher and feeding dog.    Baseline 9/7: unable to perform    Time 12    Period Weeks    Status New    Target Date 03/04/21                   Plan - 12/31/20 1200     Clinical Impression Statement Continued with POC, focus on large amplitude movements, proprioceptive awaresss,  dual task balance, acurracy in step planning. Pt tolerates session very well, given 3-4 recovery intervals to maximize performance, but no frank fatigue limitations. Pt remain motivated to advance his activity repertoire. Thius session successful in not aggravating any of his chronic shoulder and knee DJD pain.    Personal Factors and Comorbidities Age;Comorbidity 3+;Fitness;Past/Current Experience;Time since onset of injury/illness/exacerbation;Transportation    Comorbidities Parkinson's with autonomic instability, asthma, GERD depression, degenerative spondylolisthesis, focal seizures and migraine syndrome    Examination-Activity Limitations Bathing;Bed Mobility;Bend;Caring for [employer];Locomotion Level;Lift;Hygiene/Grooming;Squat;Stairs;Stand;Toileting;Transfers    Examination-Participation Restrictions Church;Cleaning;Community Activity;Driving;Meal Prep;Medication Management;Laundry;Shop;Volunteer;Yard Work    [employer]chant [employer] officer Evolving/Moderate complexity    Clinical Decision Making Moderate    Rehab Potential Fair    PT Frequency 2x / week    PT Duration 12 weeks    PT Treatment/Interventions ADLs/Self Care Home Management;Biofeedback;Canalith Repostioning;Cryotherapy;Parrafin;Ultrasound;Traction;Moist Heat;Iontophoresis 59m/ml Dexamethasone;Electrical Stimulation;DME Instruction;Gait training;Stair training;Functional mobility training;Neuromuscular re-education;Balance training;Therapeutic exercise;Therapeutic activities;Patient/family education;Orthotic Fit/Training;Manual techniques;Passive range of motion;Manual lymph drainage;Energy conservation;Splinting;Taping;Vasopneumatic Device;Vestibular;Joint Manipulations;Visual/perceptual remediation/compensation    PT Next Visit Plan Continue to advance motor control strength, activities that maximize repitition of inititation and cessation of movement    PT Home Exercise Plan No updates today     Consulted and Agree with Plan of Care Patient    Family Member Consulted  wife             Patient will benefit from skilled therapeutic intervention in order to improve the following deficits and impairments:  Abnormal gait, Decreased activity tolerance, Decreased balance, Decreased knowledge of precautions, Decreased endurance, Decreased coordination, Decreased cognition, Decreased knowledge of use of DME, Decreased mobility, Decreased safety awareness, Difficulty walking, Decreased strength, Impaired flexibility, Impaired perceived functional ability, Impaired sensation, Impaired tone, Impaired UE functional use, Impaired vision/preception, Improper body mechanics, Postural dysfunction  Visit Diagnosis: Parkinson's disease ([place])  Muscle weakness (generalized)  Other lack of coordination  Other abnormalities of gait and mobility  Unsteadiness on feet     Problem List Patient Active Problem List   Diagnosis Date Noted   Focal seizure ([place]) 05/24/2020   Parkinson's disease ([place])    History of migraine headaches    Migraine syndrome    BPH (benign prostatic hyperplasia)    Depression    Degenerative spondylolisthesis 05/07/2016   12:12 PM, 12/31/20 Etta Grandchild, PT, DPT Physical Therapist - [facility]  Outpatient Physical Therapy- [place] [phone]     [place], PT 12/31/2020, 12:04 PM  [place] MAIN [place] [address] [place], [place], [address] Phone: [phone]   Fax:  [phone]  Name: Cory [name] MRN: [id number] Date of Birth: [birth year]

## 2021-01-01 NOTE — Therapy (Signed)
[place] MAIN [facility] SERVICES [address] [place], [place], [address] Phone: [phone]   Fax:  [phone]  Occupational Therapy Treatment/Progress Note Reporting period starting 11/24/2020 Patient Details  Name: Cory [name] MRN: [id number] Date of Birth: 30-Nov-1955 No data recorded  Encounter Date: 12/31/2020   OT End of Session - 01/01/21 0821     Visit Number 20    Number of Visits 66    Date for OT Re-Evaluation 03/15/21    Authorization Time Period Reporting period starting 12/22/2020    OT Start Time 1145    OT Stop Time 69    OT Time Calculation (min) 45 min    Equipment Utilized During Treatment 4 wheeled walker    Activity Tolerance Patient tolerated treatment well    Behavior During Therapy WFL for tasks assessed/performed             Past Medical History:  Diagnosis Date   Arthritis    hips and back   Asthma    as a baby-    GERD (gastroesophageal reflux disease)    rare -    Parkinson's disease ([place])    Pneumonia    age 65   Rash    legs- on going    Past Surgical History:  Procedure Laterality Date   APPENDECTOMY     BACK SURGERY     posterior lumbar spine fusion L4-5   COLONOSCOPY     COLONOSCOPY WITH PROPOFOL N/A 06/06/2017   Procedure: COLONOSCOPY WITH PROPOFOL;  Surgeon: Manya Silvas, MD;  Location: [facility] ENDOSCOPY;  Service: Endoscopy;  Laterality: N/A;   KNEE ARTHROSCOPY Bilateral    ACL   KNEE ARTHROSCOPY W/ ACL RECONSTRUCTION     RADIAL KERATOTOMY     ROTATOR CUFF REPAIR Bilateral    SHOULDER ARTHROSCOPY WITH BICEPS TENDON REPAIR Left 04/16/2019   Procedure: SHOULDER ARTHROSCOPY WITH BICEPS TENDON REPAIR, MINI OPEN SUPERIOR CAPSULAR RECONSTRUCTION, BICEPS TENODESIS;  Surgeon: Leim Fabry, MD;  Location: [facility] ORS;  Service: Orthopedics;  Laterality: Left;    There were no vitals filed for this visit.   Subjective Assessment - 12/31/20 0819     Subjective  "I have not been driving on  the interstate."    Patient is accompanied by: Family member    Pertinent History Parkinson's Disease, R sided weakness, falls    Limitations balance, strength, coordination, visual perception, problem solving, processing information    Patient Stated Goals "I want to get stronger.  It frustrates me when I have to ask for help with things like cutting my food or buttoning a button."    Currently in Pain? No/denies    Pain Score 0-No pain    Pain Onset More than a month ago           Occupational Therapy Treatment/Progress Note: Neuro re-ed: Participation in grasping/pinching/object manipulation with use of Purdue pegboard and grooved pegs, working to improve [facility] to manage clothing fasteners and pick up small ADL supplies.  Self Care: Pt brought in his driving assessment which he completed with an independent company.  OT reviewed results with pt/spouse with OT recommendation to avoid driving d/t noted safety concerns with decreased awareness of impact of high speed and frequent veering into another lane.  Pt verbalized that he hadn't been driving on the interstate, and has recently struggled with night time driving.  OT discouraged all driving, especially night time and interstate driving, but reinforced recommendation to avoid all driving.  OT  encouraged pt bring his driving assessment to his upcoming neurologist appointment to discuss with MD, which he stated he would plan to do.  Pt was not receptive to avoiding driving, but did say that he no longer planned to drive on the interstate or at night time.   Response to Treatment: Pt has recently been struggling with "on" and "off" periods with his Parkinson's, having more "off" days and mornings lately.  Pt has had several falls this month, and has required increased caregiver assist to complete morning ADLs.  OT necessary to continue to address ADL safety, compensatory strategies, up/down grade HEP as needed as pt's symptoms change d/t the  nature of Parkinson's progressive decline.        OT Education - 12/31/20 0820     Education Details driving safety    Person(s) Educated Patient;Spouse    Methods Explanation;Verbal cues;Demonstration    Comprehension Verbalized understanding;Need further instruction;Verbal cues required              OT Short Term Goals - 12/31/20 0822       OT SHORT TERM GOAL #1   Title Pt will perform BUE HEP with min vc    Baseline Eval: not yet initiated; 11/19/2020: Pt able to perform with min vc; 12/22/20: indep with cane exercises, need to instruct further in theraband exercises    Time 6    Period Weeks    Status On-going    Target Date 02/01/21      OT SHORT TERM GOAL #2   Title Pt will report RPE of 4 or better to indicate improved tolerance to basic ADLs.    Baseline Eval: RPE of 6 with basic ADLs; 11/19/2020: RPE of 5 with basic ADLs; 12/22/20: RPE for ADLs fluctuates greatly from 4 on a good day, to 10 on a bad day; 12/31/20: RPE continues to fluctate greatly throughout the week 4-10    Time 6    Period Weeks    Status On-going    Target Date 02/01/21      OT SHORT TERM GOAL #3   Title Pt will utilize visual compensation strategies with min vc in order to improve basic and IADL performance.    Baseline Eval: pt reports some diplopia and decreased depth perception which limits ADL/IADL performance; Pt has been instructed in diplopia exercises and requires mod vc for compensation strategies when diplopia is present; 12/22/20: pt is indep with visual exercises and knowing when to allow rest breaks or use compensatory strategies, vs request caregiver assist for difficult tasks.    Time 6    Period Weeks    Status Achieved    Target Date 12/22/20               OT Long Term Goals - 12/31/20 0828       OT LONG TERM GOAL #1   Title Pt will increase bilat shoulder strength by 1MM grade in order to improve tolerance to UB ADLs/IADLs and reaching activities.    Baseline Eval:  R/L shoulder flex/abd 4-/5, R/L shoulder ER 3+/5; stamina is improving but strength grades still same as eval; R/L shoulder flex/abd 4-, L ER 4-, R ER 3+, L/R IR 4 (pt reports now able to reach easier into overhead kitchen cabinets).    Time 12    Period Weeks    Status On-going    Target Date 03/15/21      OT LONG TERM GOAL #2   Title Pt will manage clothing fasteners  and looping belt with modified indep.    Baseline Eval: spouse assists.  Difficulty with tying shoes, looping belt, snapping and buttoning buttons; Using compensatory strategies for shoe tying as pt now has elastic shoe laces.  Pt now managing his own belt with slight extra time.  Increased time to fasten buttons; 12/22/2020: Pt is consistent to loop belt using compensatory strategies, but struggles with buttoning shirts consistently and typically spouse will assist    Time 12    Period Weeks    Status On-going    Target Date 03/15/21      OT LONG TERM GOAL #3   Title Pt will cut food with modified indep.    Baseline Eval: spouse assists to cut food; Indep to cut food; 12/22/2020: Pt now struggles to cut meat; 12/31/20: Pt can cut chicken with min vc to use large/exaggerated sawing motions but spouse cuts steak.    Time 12    Period Weeks    Status On-going    Target Date 03/15/21      OT LONG TERM GOAL #4   Title Pt will brush hair with R dominant arm with modified indep.    Baseline Eval: Pt states he must hold R arm up with L hand, and often spouse must assist; 11/19/2020: Pt can reach to top and back of head with RUE but arm fatigues quickly with repetition required to brush hair.    Time 12    Period Weeks    Status Achieved    Target Date 12/22/20      OT LONG TERM GOAL #5   Title Pt will increase FOTO score to 62 or better to indicate measureable functional improvement with ADLs/IADLs.    Baseline Eval: FOTO score of 58; 12/22/2020: 57    Time 12    Period Weeks    Status On-going    Target Date 03/15/21      OT  LONG TERM GOAL #6   Title Pt will improve time management skills utilizing external compensatory strategies such as calendars, schedules, alarms as needed with min A.    Baseline 12/22/2020: Not yet initiated (spouse reports that pt is taking longer to complete ADLs yet he is not allowing extra time to complete, causing a rush to get out the door).    Time 12    Period Weeks    Status On-going    Target Date 03/15/21      OT LONG TERM GOAL #7   Title Pt will improve dynamic standing balance to enable pt to feed dogs with modified indep.    Baseline 12/22/2020: Several falls over the last week; pt is inconsistent to use his rollator for dynamic standing tasks.  Would need supv to feed dogs.    Time 12    Period Weeks    Status On-going    Target Date 03/15/21                   Plan - 12/31/20 0846     Clinical Impression Statement Pt has recently been struggling with "on" and "off" periods with his Parkinson's, having more "off" days and mornings lately.  Pt has had several falls this month, and has required increased caregiver assist to complete morning ADLs.  OT necessary to continue to address ADL safety, compensatory strategies, up/down grade HEP as needed as pt's symptoms change d/t the nature of Parkinson's progressive decline.    OT Occupational Profile and History Detailed Assessment- Review of Records and  additional review of physical, cognitive, psychosocial history related to current functional performance    Occupational performance deficits (Please refer to evaluation for details): ADL's;IADL's;Leisure    Body Structure / Function / Physical Skills ADL;Coordination;Endurance;GMC;UE functional use;Balance;Sensation;IADL;Vision;Dexterity;FMC;Strength;Continence;Gait;Mobility    Rehab Potential Good    Clinical Decision Making Several treatment options, min-mod task modification necessary    Comorbidities Affecting Occupational Performance: May have comorbidities impacting  occupational performance    Modification or Assistance to Complete Evaluation  Min-Moderate modification of tasks or assist with assess necessary to complete eval    OT Frequency 2x / week    OT Duration 12 weeks    OT Treatment/Interventions Self-care/ADL training;Therapeutic exercise;DME and/or AE instruction;Balance training;Neuromuscular education;Manual Therapy;Visual/perceptual remediation/compensation;Therapeutic activities;Patient/family education    Consulted and Agree with Plan of Care Patient             Patient will benefit from skilled therapeutic intervention in order to improve the following deficits and impairments:   Body Structure / Function / Physical Skills: ADL, Coordination, Endurance, GMC, UE functional use, Balance, Sensation, IADL, Vision, Dexterity, FMC, Strength, Continence, Gait, Mobility       Visit Diagnosis: Parkinson's disease ([place])  Muscle weakness (generalized)  Other lack of coordination    Problem List Patient Active Problem List   Diagnosis Date Noted   Focal seizure ([place]) 05/24/2020   Parkinson's disease ([place])    History of migraine headaches    Migraine syndrome    BPH (benign prostatic hyperplasia)    Depression    Degenerative spondylolisthesis 05/07/2016   Leta Speller, MS, OTR/L  Darleene Cleaver, OT/L 01/01/2021, 8:46 AM  [facility] [address] [place], [place], [address] Phone: [phone]   Fax:  [phone]  Name: Cory [name] MRN: [id number] Date of Birth: 30-Jan-1956

## 2021-01-05 ENCOUNTER — Ambulatory Visit: Payer: Medicare PPO

## 2021-01-05 ENCOUNTER — Other Ambulatory Visit: Payer: Self-pay

## 2021-01-05 ENCOUNTER — Ambulatory Visit: Payer: Medicare PPO | Attending: Neurology

## 2021-01-05 DIAGNOSIS — M6281 Muscle weakness (generalized): Secondary | ICD-10-CM

## 2021-01-05 DIAGNOSIS — R2681 Unsteadiness on feet: Secondary | ICD-10-CM | POA: Diagnosis present

## 2021-01-05 DIAGNOSIS — R2689 Other abnormalities of gait and mobility: Secondary | ICD-10-CM

## 2021-01-05 DIAGNOSIS — G20 Parkinson's disease: Secondary | ICD-10-CM

## 2021-01-05 DIAGNOSIS — R278 Other lack of coordination: Secondary | ICD-10-CM | POA: Insufficient documentation

## 2021-01-05 DIAGNOSIS — G20A1 Parkinson's disease without dyskinesia, without mention of fluctuations: Secondary | ICD-10-CM

## 2021-01-05 NOTE — Therapy (Signed)
[place] MAIN [facility] SERVICES [address] [place], [place], [address] Phone: [phone]   Fax:  [phone]  Physical Therapy Treatment  Patient Details  Name: Cory [name] MRN: [id number] Date of Birth: 04/27/1955 Referring Provider (PT): Benay Spice   Encounter Date: 01/05/2021   PT End of Session - 01/05/21 1239     Visit Number 27    Number of Visits 45    Date for PT Re-Evaluation 03/04/21    Authorization Type [employer] PPO    Authorization Time Period Cert 11/10/54-43/32/95    Progress Note Due on Visit 30    PT Start Time 1102    PT Stop Time 1145    PT Time Calculation (min) 43 min    Equipment Utilized During Treatment Gait belt    Activity Tolerance Patient tolerated treatment well;Patient limited by fatigue    Behavior During Therapy WFL for tasks assessed/performed             Past Medical History:  Diagnosis Date   Arthritis    hips and back   Asthma    as a baby-    GERD (gastroesophageal reflux disease)    rare -    Parkinson's disease ([place])    Pneumonia    age 17   Rash    legs- on going    Past Surgical History:  Procedure Laterality Date   APPENDECTOMY     BACK SURGERY     posterior lumbar spine fusion L4-5   COLONOSCOPY     COLONOSCOPY WITH PROPOFOL N/A 06/06/2017   Procedure: COLONOSCOPY WITH PROPOFOL;  Surgeon: Manya Silvas, MD;  Location: [facility] ENDOSCOPY;  Service: Endoscopy;  Laterality: N/A;   KNEE ARTHROSCOPY Bilateral    ACL   KNEE ARTHROSCOPY W/ ACL RECONSTRUCTION     RADIAL KERATOTOMY     ROTATOR CUFF REPAIR Bilateral    SHOULDER ARTHROSCOPY WITH BICEPS TENDON REPAIR Left 04/16/2019   Procedure: SHOULDER ARTHROSCOPY WITH BICEPS TENDON REPAIR, MINI OPEN SUPERIOR CAPSULAR RECONSTRUCTION, BICEPS TENODESIS;  Surgeon: Leim Fabry, MD;  Location: [facility] ORS;  Service: Orthopedics;  Laterality: Left;    There were no vitals filed for this visit.   Subjective Assessment -  01/05/21 1238     Subjective Patient meets with neurologist tomorrow. Fell last night and hit head. Wife performed concussion screening, patient cleared of concussion.    Patient is accompained by: Family member    Pertinent History Patient admitted to hospital 05/23/20 with code stroke and R sided weakness. Imaging negative for acute changes. PMH includes Parkinson's with autonomic instability, asthma, GERD depression, degenerative spondylolisthesis, focal seizures and migraine syndrome. Patient had three ED visits for falls since then. Patient reports when he has the seizures he has pain in his temples and bright flashes in his vision. Last week had three falls in one day.    Patient Stated Goals stability, have a device that will be most helpful, help to become and stay independent.    Currently in Pain? No/denies               Neuro Re-ed  Cones: -weaving between cones with walker: cue for "reset" when patient has increased heel raise with shoulder shrug and loss of walker. X8 trials;  -stop light (red to orange slow exaggerated movements, orange to yellow normal step/speed, yellow to green fast steps) x 8 trials  -step over theraband on floor 10x each LE ; second set with rainbow ball  pass 10x each LE ; challenging for coordination -airex pad: alternating cross punch throws 5x 30 second trials; frequent forward trunk lean and excessive plantarflexion.   TherEx -sit to stand jump for power 10x cue for depth of squat.  -sit to stand 5x with cue for arms crossed.   Pt educated throughout session about proper posture and technique with exercises. Improved exercise technique, movement at target joints, use of target muscles after min to mod verbal, visual, tactile cues.    Patient has increased plantarflexion this session with forward momentum LOB. Education with patient and patient's wife about discussing increase in "off day" and increase in falls. Training with focus on turns and  stepping over changing color of surfaces performed with patient requiring frequent cues to "reset" prior to re-attempting movement to reduce episodes of shuffle steppage. Patient will benefit from skilled physical therapy to improve balance, gait mechanics, and overall functional mobility                   PT Education - 01/05/21 1238     Education Details exercise technique, body mechanics    Person(s) Educated Patient    Methods Explanation;Demonstration;Tactile cues;Verbal cues    Comprehension Verbalized understanding;Returned demonstration;Verbal cues required;Tactile cues required              PT Short Term Goals - 10/21/20 0903       PT SHORT TERM GOAL #1   Title Patient will be independent in home exercise program to improve strength/mobility for better functional independence with ADLs.    Baseline 6/16: HEP to be given next session 7/19: HEP compliant    Time 4    Period Weeks    Status Achieved    Target Date 10/16/20               PT Long Term Goals - 12/10/20 1231       PT LONG TERM GOAL #1   Title Patient will increase FOTO score to equal to or greater than 51%    to demonstrate statistically significant improvement in mobility and quality of life.    Baseline 6/16: 48% 7/19: 57% 8/31: 50%    Time 12    Period Weeks    Status Achieved      PT LONG TERM GOAL #2   Title Patient will increase Berg Balance score by > 6 points (34/56)  to demonstrate decreased fall risk during functional activities.    Baseline 6/16: 28/56 7/19: 45/56    Time 12    Period Weeks    Status Achieved      PT LONG TERM GOAL #3   Title Patient will deny any falls over past 4 weeks to demonstrate improved safety awareness at home and work.    Baseline 6/16: multiple falls 7/19: no falls reported 9/7: 3 falls in past month    Time 12    Period Weeks    Status On-going    Target Date 03/04/21      PT LONG TERM GOAL #4   Title Patient will increase six minute  walk test distance to >1300 for progression to age norm community ambulator and improve gait ability    Baseline 6/16: 945 ft with multiple near falls 7/19: 1495 ft    Time 12    Period Weeks    Status Achieved      PT LONG TERM GOAL #5   Title Patient will increase Berg Balance score by > 6 points (51/56)  to demonstrate decreased fall risk during functional activities.    Baseline 7/19: 45/56 8/31: 49/56    Time 12    Period Weeks    Status Partially Met    Target Date 03/04/21      Additional Long Term Goals   Additional Long Term Goals Yes      PT LONG TERM GOAL #6   Title Patient will increase Functional Gait Assessment score to >20/30 as to reduce fall risk and improve dynamic gait safety with community ambulation.    Baseline 7/19: 16/30 8/31: 20/30    Time 12    Period Weeks    Status Achieved      PT LONG TERM GOAL #7   Title Patient will increase Functional Gait Assessment score to >26/30 as to reduce fall risk and improve dynamic gait safety with community ambulation.    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    Baseline Unable to perform without leaning on objects    Time 12    Period Weeks    Status New    Target Date 03/04/21      PT LONG TERM GOAL  #9   TITLE Patient will be able to ambulate approximately 1 mile walking dog across changing surfaces such as grass, concrete, and sidewalk without loss of balance.    Baseline 9/7: unable to perform yet    Time 12    Period Weeks    Status New    Target Date 03/04/21      PT LONG TERM GOAL  #10   TITLE Patient will bend down and pick up items without losing balance to perform iADLs such as emptying dishwasher and feeding dog.    Baseline 9/7: unable to perform    Time 12     Period Weeks    Status New    Target Date 03/04/21                   Plan - 01/05/21 1241     Clinical Impression Statement Patient has increased plantarflexion this session with forward momentum LOB. Education with patient and patient's wife about discussing increase in "off day" and increase in falls. Training with focus on turns and stepping over changing color of surfaces performed with patient requiring frequent cues to "reset" prior to re-attempting movement to reduce episodes of shuffle steppage. Patient will benefit from skilled physical therapy to improve balance, gait mechanics, and overall functional mobility    Personal Factors and Comorbidities Age;Comorbidity 3+;Fitness;Past/Current Experience;Time since onset of injury/illness/exacerbation;Transportation    Comorbidities Parkinson's with autonomic instability, asthma, GERD depression, degenerative spondylolisthesis, focal seizures and migraine syndrome    Examination-Activity Limitations Bathing;Bed Mobility;Bend;Caring for [employer];Locomotion Level;Lift;Hygiene/Grooming;Squat;Stairs;Stand;Toileting;Transfers    Examination-Participation Restrictions Church;Cleaning;Community Activity;Driving;Meal Prep;Medication Management;Laundry;Shop;Volunteer;Yard Work    [employer]chant [employer] officer Evolving/Moderate complexity    Rehab Potential Fair    PT Frequency 2x / week    PT Duration 12 weeks    PT Treatment/Interventions ADLs/Self Care Home Management;Biofeedback;Canalith Repostioning;Cryotherapy;Parrafin;Ultrasound;Traction;Moist Heat;Iontophoresis 18m/ml Dexamethasone;Electrical Stimulation;DME Instruction;Gait training;Stair training;Functional mobility training;Neuromuscular re-education;Balance training;Therapeutic exercise;Therapeutic activities;Patient/family education;Orthotic Fit/Training;Manual techniques;Passive range of motion;Manual lymph drainage;Energy  conservation;Splinting;Taping;Vasopneumatic Device;Vestibular;Joint Manipulations;Visual/perceptual remediation/compensation    PT Next Visit  Plan Continue to advance motor control strength, activities that maximize repitition of inititation and cessation of movement    PT Home Exercise Plan No updates today    Consulted and Agree with Plan of Care Patient    Family Member Consulted wife             Patient will benefit from skilled therapeutic intervention in order to improve the following deficits and impairments:  Abnormal gait, Decreased activity tolerance, Decreased balance, Decreased knowledge of precautions, Decreased endurance, Decreased coordination, Decreased cognition, Decreased knowledge of use of DME, Decreased mobility, Decreased safety awareness, Difficulty walking, Decreased strength, Impaired flexibility, Impaired perceived functional ability, Impaired sensation, Impaired tone, Impaired UE functional use, Impaired vision/preception, Improper body mechanics, Postural dysfunction  Visit Diagnosis: Parkinson's disease ([place])  Muscle weakness (generalized)  Other abnormalities of gait and mobility     Problem List Patient Active Problem List   Diagnosis Date Noted   Focal seizure ([place]) 05/24/2020   Parkinson's disease ([place])    History of migraine headaches    Migraine syndrome    BPH (benign prostatic hyperplasia)    Depression    Degenerative spondylolisthesis 05/07/2016    Janna Arch, PT, DPT  01/05/2021, 12:42 PM  [place] MAIN [facility] SERVICES [address] [place], [place], [address] Phone: [phone]   Fax:  [phone]  Name: ABHIRAJ DOZAL MRN: [id number] Date of Birth: Aug 14, 1955

## 2021-01-05 NOTE — Therapy (Signed)
[place] MAIN [facility] SERVICES [address] [place], [place], [address] Phone: [phone]   Fax:  [phone]  Occupational Therapy Treatment  Patient Details  Name: Cory [name] MRN: [id number] Date of Birth: [birth year] No data recorded  Encounter Date: 01/05/2021   OT End of Session - 01/05/21 1318     Visit Number 21    Number of Visits 33    Date for OT Re-Evaluation 03/15/21    Authorization Time Period Reporting period starting 01/05/2021    OT Start Time 1148    OT Stop Time 1235    OT Time Calculation (min) 47 min    Equipment Utilized During Treatment 4 wheeled walker    Activity Tolerance Patient tolerated treatment well    Behavior During Therapy WFL for tasks assessed/performed             Past Medical History:  Diagnosis Date   Arthritis    hips and back   Asthma    as a baby-    GERD (gastroesophageal reflux disease)    rare -    Parkinson's disease ([place])    Pneumonia    age 60   Rash    legs- on going    Past Surgical History:  Procedure Laterality Date   APPENDECTOMY     BACK SURGERY     posterior lumbar spine fusion L4-5   COLONOSCOPY     COLONOSCOPY WITH PROPOFOL N/A 06/06/2017   Procedure: COLONOSCOPY WITH PROPOFOL;  Surgeon: Manya Silvas, MD;  Location: [facility] ENDOSCOPY;  Service: Endoscopy;  Laterality: N/A;   KNEE ARTHROSCOPY Bilateral    ACL   KNEE ARTHROSCOPY W/ ACL RECONSTRUCTION     RADIAL KERATOTOMY     ROTATOR CUFF REPAIR Bilateral    SHOULDER ARTHROSCOPY WITH BICEPS TENDON REPAIR Left 04/16/2019   Procedure: SHOULDER ARTHROSCOPY WITH BICEPS TENDON REPAIR, MINI OPEN SUPERIOR CAPSULAR RECONSTRUCTION, BICEPS TENODESIS;  Surgeon: Leim Fabry, MD;  Location: [facility] ORS;  Service: Orthopedics;  Laterality: Left;    There were no vitals filed for this visit.   Subjective Assessment - 01/05/21 1317     Subjective  "I told everyone at [facility] on Sunday that I don't plan to drive anymore."     Patient is accompanied by: Family member    Pertinent History Parkinson's Disease, R sided weakness, falls    Limitations balance, strength, coordination, visual perception, problem solving, processing information    Patient Stated Goals "I want to get stronger.  It frustrates me when I have to ask for help with things like cutting my food or buttoning a button."    Currently in Pain? No/denies    Pain Score 0-No pain    Pain Onset More than a month ago            Occupational Therapy Treatment: Self Care: Focus today on clothing orientation, recognizing inside out/front/back of clothing.  Encouraged pt look for tags/buttons/seams to help with clothing orientation and urged pt/spouse to use Sharpie marker to mark for any faded tags in older clothing.  Focussed also on compensatory strategies for time management and fall prevention, including turning off phone/tv when dressing d/t difficulty with dual tasking, maximize lighting, dress where pt can be seated in a chair with a back and arm rests to reduce fall risk.  Plan ahead the night before to allow enough time in the morning to avoid rushing.  Reviewed donning strategies for button up shirts, encouraging bilat hands partially  threaded through shirt sleeve before bringing shirt behind pt's back in order to avoid having to reach behind back for 2nd arm sleeve.  May need to change sequencing of donning a sweatshirt, as pt reports he dons both arms through then pulls overhead and has difficulty pulling sweatshirt down.  Encouraged pt bring in a sweatshirt and any other shirts that have been challenging to enable practice and a trial of various strategies during next OT session.  Pt in agreement.    Response to Treatment: Pt verbalized that he had told his fellow church members yesterday that he plans to give up driving.  Pt does plan to discuss this with his neurologist tomorrow as well.  Pt reporting increased difficulty with dressing and knowing  if clothing is inside out or turned around.  Pt receptive to all strategies reviewed today and will benefit from additional dressing training when pt brings in his own clothing next session.    OT Education - 01/05/21 1318     Education Details "I've been having a really difficult time dressing lately."    Person(s) Educated Patient;Spouse    Methods Explanation;Verbal cues;Demonstration    Comprehension Verbalized understanding;Need further instruction;Verbal cues required              OT Short Term Goals - 12/31/20 1610       OT SHORT TERM GOAL #1   Title Pt will perform BUE HEP with min vc    Baseline Eval: not yet initiated; 11/19/2020: Pt able to perform with min vc; 12/22/20: indep with cane exercises, need to instruct further in theraband exercises    Time 6    Period Weeks    Status On-going    Target Date 02/01/21      OT [place] #2   Title Pt will report RPE of 4 or better to indicate improved tolerance to basic ADLs.    Baseline Eval: RPE of 6 with basic ADLs; 11/19/2020: RPE of 5 with basic ADLs; 12/22/20: RPE for ADLs fluctuates greatly from 4 on a good day, to 10 on a bad day; 12/31/20: RPE continues to fluctate greatly throughout the week 4-10    Time 6    Period Weeks    Status On-going    Target Date 02/01/21      OT SHORT TERM GOAL #3   Title Pt will utilize visual compensation strategies with min vc in order to improve basic and IADL performance.    Baseline Eval: pt reports some diplopia and decreased depth perception which limits ADL/IADL performance; Pt has been instructed in diplopia exercises and requires mod vc for compensation strategies when diplopia is present; 12/22/20: pt is indep with visual exercises and knowing when to allow rest breaks or use compensatory strategies, vs request caregiver assist for difficult tasks.    Time 6    Period Weeks    Status Achieved    Target Date 12/22/20               OT Long Term Goals - 12/31/20 0828        OT LONG TERM GOAL #1   Title Pt will increase bilat shoulder strength by 1MM grade in order to improve tolerance to UB ADLs/IADLs and reaching activities.    Baseline Eval: R/L shoulder flex/abd 4-/5, R/L shoulder ER 3+/5; stamina is improving but strength grades still same as eval; R/L shoulder flex/abd 4-, L ER 4-, R ER 3+, L/R IR 4 (pt reports now able to  reach easier into overhead kitchen cabinets).    Time 12    Period Weeks    Status On-going    Target Date 03/15/21      OT LONG TERM GOAL #2   Title Pt will manage clothing fasteners and looping belt with modified indep.    Baseline Eval: spouse assists.  Difficulty with tying shoes, looping belt, snapping and buttoning buttons; Using compensatory strategies for shoe tying as pt now has elastic shoe laces.  Pt now managing his own belt with slight extra time.  Increased time to fasten buttons; 12/22/2020: Pt is consistent to loop belt using compensatory strategies, but struggles with buttoning shirts consistently and typically spouse will assist    Time 12    Period Weeks    Status On-going    Target Date 03/15/21      OT LONG TERM GOAL #3   Title Pt will cut food with modified indep.    Baseline Eval: spouse assists to cut food; Indep to cut food; 12/22/2020: Pt now struggles to cut meat; 12/31/20: Pt can cut chicken with min vc to use large/exaggerated sawing motions but spouse cuts steak.    Time 12    Period Weeks    Status On-going    Target Date 03/15/21      OT LONG TERM GOAL #4   Title Pt will brush hair with R dominant arm with modified indep.    Baseline Eval: Pt states he must hold R arm up with L hand, and often spouse must assist; 11/19/2020: Pt can reach to top and back of head with RUE but arm fatigues quickly with repetition required to brush hair.    Time 12    Period Weeks    Status Achieved    Target Date 12/22/20      OT LONG TERM GOAL #5   Title Pt will increase FOTO score to 62 or better to indicate  measureable functional improvement with ADLs/IADLs.    Baseline Eval: FOTO score of 58; 12/22/2020: 57    Time 12    Period Weeks    Status On-going    Target Date 03/15/21      OT LONG TERM GOAL #6   Title Pt will improve time management skills utilizing external compensatory strategies such as calendars, schedules, alarms as needed with min A.    Baseline 12/22/2020: Not yet initiated (spouse reports that pt is taking longer to complete ADLs yet he is not allowing extra time to complete, causing a rush to get out the door).    Time 12    Period Weeks    Status On-going    Target Date 03/15/21      OT LONG TERM GOAL #7   Title Pt will improve dynamic standing balance to enable pt to feed dogs with modified indep.    Baseline 12/22/2020: Several falls over the last week; pt is inconsistent to use his rollator for dynamic standing tasks.  Would need supv to feed dogs.    Time 12    Period Weeks    Status On-going    Target Date 03/15/21                   Plan - 01/05/21 1330     Clinical Impression Statement Pt verbalized that he had told his fellow church members yesterday that he plans to give up driving.  Pt does plan to discuss this with his neurologist tomorrow as well.  Pt reporting  increased difficulty with dressing and knowing if clothing is inside out or turned around.  Pt receptive to all strategies reviewed today and will benefit from additional dressing training when pt brings in his own clothing next session.    OT Occupational Profile and History Detailed Assessment- Review of Records and additional review of physical, cognitive, psychosocial history related to current functional performance    Occupational performance deficits (Please refer to evaluation for details): ADL's;IADL's;Leisure    Body Structure / Function / Physical Skills ADL;Coordination;Endurance;GMC;UE functional use;Balance;Sensation;IADL;Vision;Dexterity;FMC;Strength;Continence;Gait;Mobility     Rehab Potential Good    Clinical Decision Making Several treatment options, min-mod task modification necessary    Comorbidities Affecting Occupational Performance: May have comorbidities impacting occupational performance    Modification or Assistance to Complete Evaluation  Min-Moderate modification of tasks or assist with assess necessary to complete eval    OT Frequency 2x / week    OT Duration 12 weeks    OT Treatment/Interventions Self-care/ADL training;Therapeutic exercise;DME and/or AE instruction;Balance training;Neuromuscular education;Manual Therapy;Visual/perceptual remediation/compensation;Therapeutic activities;Patient/family education    Consulted and Agree with Plan of Care Patient             Patient will benefit from skilled therapeutic intervention in order to improve the following deficits and impairments:   Body Structure / Function / Physical Skills: ADL, Coordination, Endurance, GMC, UE functional use, Balance, Sensation, IADL, Vision, Dexterity, FMC, Strength, Continence, Gait, Mobility       Visit Diagnosis: Parkinson's disease ([place])  Muscle weakness (generalized)  Other lack of coordination    Problem List Patient Active Problem List   Diagnosis Date Noted   Focal seizure ([place]) 05/24/2020   Parkinson's disease ([place])    History of migraine headaches    Migraine syndrome    BPH (benign prostatic hyperplasia)    Depression    Degenerative spondylolisthesis 05/07/2016   Leta Speller, MS, OTR/L  Darleene Cleaver, OT/L 01/05/2021, 1:30 PM  [place] [address] [place], [place], [address] Phone: [phone]   Fax:  [phone]  Name: Cory [name] MRN: [id number] Date of Birth: 08/10/1955

## 2021-01-07 ENCOUNTER — Ambulatory Visit: Payer: Medicare PPO

## 2021-01-07 ENCOUNTER — Other Ambulatory Visit: Payer: Self-pay

## 2021-01-07 DIAGNOSIS — R2689 Other abnormalities of gait and mobility: Secondary | ICD-10-CM

## 2021-01-07 DIAGNOSIS — G20A1 Parkinson's disease without dyskinesia, without mention of fluctuations: Secondary | ICD-10-CM

## 2021-01-07 DIAGNOSIS — M6281 Muscle weakness (generalized): Secondary | ICD-10-CM

## 2021-01-07 DIAGNOSIS — G20 Parkinson's disease: Secondary | ICD-10-CM

## 2021-01-07 DIAGNOSIS — R278 Other lack of coordination: Secondary | ICD-10-CM

## 2021-01-07 NOTE — Therapy (Signed)
[place] MAIN [facility] SERVICES [address] [place], [place], [address] Phone: [phone]   Fax:  [phone]  Occupational Therapy Treatment  Patient Details  Name: Cory [name] MRN: [id number] Date of Birth: 01/07/1956 No data recorded  Encounter Date: 01/07/2021   OT End of Session - 01/07/21 1523     Visit Number 22    Number of Visits 2    Date for OT Re-Evaluation 03/15/21    Authorization Time Period Reporting period starting 01/05/2021    OT Start Time 1145    OT Stop Time 47    OT Time Calculation (min) 45 min    Equipment Utilized During Treatment 4 wheeled walker    Activity Tolerance Patient tolerated treatment well    Behavior During Therapy WFL for tasks assessed/performed             Past Medical History:  Diagnosis Date   Arthritis    hips and back   Asthma    as a baby-    GERD (gastroesophageal reflux disease)    rare -    Parkinson's disease ([place])    Pneumonia    age 79   Rash    legs- on going    Past Surgical History:  Procedure Laterality Date   APPENDECTOMY     BACK SURGERY     posterior lumbar spine fusion L4-5   COLONOSCOPY     COLONOSCOPY WITH PROPOFOL N/A 06/06/2017   Procedure: COLONOSCOPY WITH PROPOFOL;  Surgeon: Manya Silvas, MD;  Location: [facility] ENDOSCOPY;  Service: Endoscopy;  Laterality: N/A;   KNEE ARTHROSCOPY Bilateral    ACL   KNEE ARTHROSCOPY W/ ACL RECONSTRUCTION     RADIAL KERATOTOMY     ROTATOR CUFF REPAIR Bilateral    SHOULDER ARTHROSCOPY WITH BICEPS TENDON REPAIR Left 04/16/2019   Procedure: SHOULDER ARTHROSCOPY WITH BICEPS TENDON REPAIR, MINI OPEN SUPERIOR CAPSULAR RECONSTRUCTION, BICEPS TENODESIS;  Surgeon: Leim Fabry, MD;  Location: [facility] ORS;  Service: Orthopedics;  Laterality: Left;    There were no vitals filed for this visit.   Subjective Assessment - 01/07/21 1522     Subjective  "I fell a couple times this morning in the shower."    Patient is  accompanied by: Family member    Pertinent History Parkinson's Disease, R sided weakness, falls    Limitations balance, strength, coordination, visual perception, problem solving, processing information    Patient Stated Goals "I want to get stronger.  It frustrates me when I have to ask for help with things like cutting my food or buttoning a button."    Currently in Pain? No/denies    Pain Score 0-No pain    Pain Onset More than a month ago            Occupational Therapy Treatment: Self Care: Instructed pt in donning strategies for tshirt, sweatshirt, and coat and trialed various sequencing with threading head first, threading both arms first vs 1 arm at a time.  Pt with most success threading head first, but then requires extra time, visual cues from mirror, and moderate vc from OT for grasping techniques.  Pt was noted to be pinching clothing between thumb and index finger, pinching a minimal amount of fabric while trying to pull shirt down abdomen.  Pt was cued to use a gross grasp and gather more clothing in hand before attempting to pull shirt down, which improved performance.  Vc for carryover with repeat attempts.  Pt struggles  to thread 2nd arm behind back with his zip up coat.  Practiced partial threading of both hands through arm sleeve then swing around R shoulder and over head, but pt required mod A from OT as coat got stuck overhead.  Trialed placing coat over the backrest of chair and sitting on chair to thread arms through, but pt struggled with reaching arms back to coat sleeves.  Additional trials needed to find most effective technique.  Reinforced fall prevention during shower, makng sure to hold to a grab bar at all times in standing, and alternating arms on grab bar as needed to manage washing.  Pt states he does have his shower chair but still must stand for part of his shower.  Pt stated that when he fell this morning in the shower he had lost his balance and had not been  holding to his grab bar.  No injury reported and pt acknowledged recommendation for holding grab bar with at least 1 hand at all times when standing.   Response to Treatment: Pt reported new changes to his Levadopa; dose was increased and time between doses has decreased, which started today.  Pt reported multiple falls this morning; OT reviewed fall prevention with ADLs, but pt requires repeated reinforcement to carry over safety techniques.  Additional trials needed to find most effective dressing strategies, but improved performance with pulling down shirt when pt was cued to use a gross grasping approach rather than pinching the edge of his clothing.  Pt will continue to benefit from skilled OT to maximize safety and indep with self care while reinforcing compensatory strategies as needed.     OT Education - 01/07/21 1522     Education Details UB dressing strategies; fall prevention during ADLs    Person(s) Educated Patient;Spouse    Methods Explanation;Verbal cues;Demonstration    Comprehension Verbalized understanding;Need further instruction;Verbal cues required              OT Short Term Goals - 12/31/20 1308       OT SHORT TERM GOAL #1   Title Pt will perform BUE HEP with min vc    Baseline Eval: not yet initiated; 11/19/2020: Pt able to perform with min vc; 12/22/20: indep with cane exercises, need to instruct further in theraband exercises    Time 6    Period Weeks    Status On-going    Target Date 02/01/21      OT SHORT TERM GOAL #2   Title Pt will report RPE of 4 or better to indicate improved tolerance to basic ADLs.    Baseline Eval: RPE of 6 with basic ADLs; 11/19/2020: RPE of 5 with basic ADLs; 12/22/20: RPE for ADLs fluctuates greatly from 4 on a good day, to 10 on a bad day; 12/31/20: RPE continues to fluctate greatly throughout the week 4-10    Time 6    Period Weeks    Status On-going    Target Date 02/01/21      OT SHORT TERM GOAL #3   Title Pt will utilize  visual compensation strategies with min vc in order to improve basic and IADL performance.    Baseline Eval: pt reports some diplopia and decreased depth perception which limits ADL/IADL performance; Pt has been instructed in diplopia exercises and requires mod vc for compensation strategies when diplopia is present; 12/22/20: pt is indep with visual exercises and knowing when to allow rest breaks or use compensatory strategies, vs request caregiver assist for difficult tasks.  Time 6    Period Weeks    Status Achieved    Target Date 12/22/20               OT Long Term Goals - 12/31/20 0828       OT LONG TERM GOAL #1   Title Pt will increase bilat shoulder strength by 1MM grade in order to improve tolerance to UB ADLs/IADLs and reaching activities.    Baseline Eval: R/L shoulder flex/abd 4-/5, R/L shoulder ER 3+/5; stamina is improving but strength grades still same as eval; R/L shoulder flex/abd 4-, L ER 4-, R ER 3+, L/R IR 4 (pt reports now able to reach easier into overhead kitchen cabinets).    Time 12    Period Weeks    Status On-going    Target Date 03/15/21      OT LONG TERM GOAL #2   Title Pt will manage clothing fasteners and looping belt with modified indep.    Baseline Eval: spouse assists.  Difficulty with tying shoes, looping belt, snapping and buttoning buttons; Using compensatory strategies for shoe tying as pt now has elastic shoe laces.  Pt now managing his own belt with slight extra time.  Increased time to fasten buttons; 12/22/2020: Pt is consistent to loop belt using compensatory strategies, but struggles with buttoning shirts consistently and typically spouse will assist    Time 12    Period Weeks    Status On-going    Target Date 03/15/21      OT LONG TERM GOAL #3   Title Pt will cut food with modified indep.    Baseline Eval: spouse assists to cut food; Indep to cut food; 12/22/2020: Pt now struggles to cut meat; 12/31/20: Pt can cut chicken with min vc to use  large/exaggerated sawing motions but spouse cuts steak.    Time 12    Period Weeks    Status On-going    Target Date 03/15/21      OT LONG TERM GOAL #4   Title Pt will brush hair with R dominant arm with modified indep.    Baseline Eval: Pt states he must hold R arm up with L hand, and often spouse must assist; 11/19/2020: Pt can reach to top and back of head with RUE but arm fatigues quickly with repetition required to brush hair.    Time 12    Period Weeks    Status Achieved    Target Date 12/22/20      OT LONG TERM GOAL #5   Title Pt will increase FOTO score to 62 or better to indicate measureable functional improvement with ADLs/IADLs.    Baseline Eval: FOTO score of 58; 12/22/2020: 57    Time 12    Period Weeks    Status On-going    Target Date 03/15/21      OT LONG TERM GOAL #6   Title Pt will improve time management skills utilizing external compensatory strategies such as calendars, schedules, alarms as needed with min A.    Baseline 12/22/2020: Not yet initiated (spouse reports that pt is taking longer to complete ADLs yet he is not allowing extra time to complete, causing a rush to get out the door).    Time 12    Period Weeks    Status On-going    Target Date 03/15/21      OT LONG TERM GOAL #7   Title Pt will improve dynamic standing balance to enable pt to feed dogs with modified  indep.    Baseline 12/22/2020: Several falls over the last week; pt is inconsistent to use his rollator for dynamic standing tasks.  Would need supv to feed dogs.    Time 12    Period Weeks    Status On-going    Target Date 03/15/21                   Plan - 01/07/21 1539     Clinical Impression Statement Pt reported new changes to his Levadopa; dose was increased and time between doses has decreased, which started today.  Pt reported multiple falls this morning; OT reviewed fall prevention with ADLs, but pt requires repeated reinforcement to carry over safety techniques.  Additional  trials needed to find most effective dressing strategies, but improved performance with pulling down shirt when pt was cued to use a gross grasping approach rather than pinching the edge of his clothing.  Pt will continue to benefit from skilled OT to maximize safety and indep with self care while reinforcing compensatory strategies as needed.    OT Occupational Profile and History Detailed Assessment- Review of Records and additional review of physical, cognitive, psychosocial history related to current functional performance    Occupational performance deficits (Please refer to evaluation for details): ADL's;IADL's;Leisure    Body Structure / Function / Physical Skills ADL;Coordination;Endurance;GMC;UE functional use;Balance;Sensation;IADL;Vision;Dexterity;FMC;Strength;Continence;Gait;Mobility    Rehab Potential Good    Clinical Decision Making Several treatment options, min-mod task modification necessary    Comorbidities Affecting Occupational Performance: May have comorbidities impacting occupational performance    Modification or Assistance to Complete Evaluation  Min-Moderate modification of tasks or assist with assess necessary to complete eval    OT Frequency 2x / week    OT Duration 12 weeks    OT Treatment/Interventions Self-care/ADL training;Therapeutic exercise;DME and/or AE instruction;Balance training;Neuromuscular education;Manual Therapy;Visual/perceptual remediation/compensation;Therapeutic activities;Patient/family education    Consulted and Agree with Plan of Care Patient             Patient will benefit from skilled therapeutic intervention in order to improve the following deficits and impairments:   Body Structure / Function / Physical Skills: ADL, Coordination, Endurance, GMC, UE functional use, Balance, Sensation, IADL, Vision, Dexterity, FMC, Strength, Continence, Gait, Mobility       Visit Diagnosis: Parkinson's disease ([place])  Muscle weakness  (generalized)  Other lack of coordination    Problem List Patient Active Problem List   Diagnosis Date Noted   Focal seizure ([place]) 05/24/2020   Parkinson's disease ([place])    History of migraine headaches    Migraine syndrome    BPH (benign prostatic hyperplasia)    Depression    Degenerative spondylolisthesis 05/07/2016   Leta Speller, MS, OTR/L  Darleene Cleaver, OT/L 01/07/2021, 3:39 PM  [place] [address] [place], [place], [address] Phone: [phone]   Fax:  [phone]  Name: DELEON PASSE MRN: [id number] Date of Birth: Dec 15, 1955

## 2021-01-07 NOTE — Therapy (Signed)
[place] MAIN [facility] SERVICES [address] [place], [place], [address] Phone: [phone]   Fax:  [phone]  Physical Therapy Treatment  Patient Details  Name: Cory [name] MRN: [id number] Date of Birth: [birth year] Referring Provider (PT): Benay Spice   Encounter Date: 01/07/2021   PT End of Session - 01/07/21 1126     Visit Number 28    Number of Visits 45    Date for PT Re-Evaluation 03/04/21    Authorization Type [employer] PPO    Authorization Time Period Cert 10/10/65-54/49/20    Progress Note Due on Visit 30    PT Start Time 1100    PT Stop Time 1144    PT Time Calculation (min) 44 min    Equipment Utilized During Treatment Gait belt    Activity Tolerance Patient tolerated treatment well;Patient limited by fatigue    Behavior During Therapy WFL for tasks assessed/performed             Past Medical History:  Diagnosis Date   Arthritis    hips and back   Asthma    as a baby-    GERD (gastroesophageal reflux disease)    rare -    Parkinson's disease ([place])    Pneumonia    age 65   Rash    legs- on going    Past Surgical History:  Procedure Laterality Date   APPENDECTOMY     BACK SURGERY     posterior lumbar spine fusion L4-5   COLONOSCOPY     COLONOSCOPY WITH PROPOFOL N/A 06/06/2017   Procedure: COLONOSCOPY WITH PROPOFOL;  Surgeon: Manya Silvas, MD;  Location: [facility] ENDOSCOPY;  Service: Endoscopy;  Laterality: N/A;   KNEE ARTHROSCOPY Bilateral    ACL   KNEE ARTHROSCOPY W/ ACL RECONSTRUCTION     RADIAL KERATOTOMY     ROTATOR CUFF REPAIR Bilateral    SHOULDER ARTHROSCOPY WITH BICEPS TENDON REPAIR Left 04/16/2019   Procedure: SHOULDER ARTHROSCOPY WITH BICEPS TENDON REPAIR, MINI OPEN SUPERIOR CAPSULAR RECONSTRUCTION, BICEPS TENODESIS;  Surgeon: Leim Fabry, MD;  Location: [facility] ORS;  Service: Orthopedics;  Laterality: Left;    There were no vitals filed for this visit.   Subjective Assessment -  01/07/21 1104     Subjective Patient went to neurologist since last session. Will start taking 5 carbadopa/levadopa instead of 4 now. Patient fell three times in the shower today didn't go all the way to the ground.    Patient is accompained by: Family member    Pertinent History Patient admitted to hospital 05/23/20 with code stroke and R sided weakness. Imaging negative for acute changes. PMH includes Parkinson's with autonomic instability, asthma, GERD depression, degenerative spondylolisthesis, focal seizures and migraine syndrome. Patient had three ED visits for falls since then. Patient reports when he has the seizures he has pain in his temples and bright flashes in his vision. Last week had three falls in one day.    Patient Stated Goals stability, have a device that will be most helpful, help to become and stay independent.    Currently in Pain? No/denies              Patient went to neurologist since last session. Will start taking 5 carbadopa/levadopa instead of 4 now. Patient fell three times in the shower today.     Neuro Re-ed   Standing with CGA next to support surface:  Airex pad: static stand 30 seconds x 2 trials, noticeable trembling of  ankles/LE's with fatigue and challenge to maintain stability Airex pad: horizontal head turns 30 seconds scanning room 10x ; cueing for arc of motion  Airex pad: vertical head turns 30 seconds, cueing for arc of motion, noticeable sway with upward gaze increasing demand on ankle righting reaction musculature Airex pad: one foot on 6" step one foot on airex pad, hold position for 30 seconds, switch legs, 2x each LE;  Airex pad 6" step alternating feet; 12x each LE    TherEx -sit to stand jump for power 10x cue for depth of squat.   Ambulate 150 ft without AD with cues for arm swing and large step    6" step: lateral step up/down 10x each direction with SUE support 6" step: crane step ups alternating each LE, SUE support 12x each  LE 3 way hip raise 10x each direction   Seated:  RTB alternating LAQ 12x each LE 6" step seated alternating taps for 1 minute for coordination and spatial awareness with muscle recruitment Seated hamstring isometric contraction on dynadisc 10x 3 second holds   Pt educated throughout session about proper posture and technique with exercises. Improved exercise technique, movement at target joints, use of target muscles after min to mod verbal, visual, tactile cues.       Patient is challenged with stability in static positions. He remains highly motivated throughout physical therapy session. Education on maintaining wide BOS performed with patient verbalizing and demonstrating understanding. Occasional seated rest breaks required throughout session due to fatigue. Patient will benefit from skilled physical therapy to improve balance, gait mechanics, and overall functional mobility                   PT Education - 01/07/21 1118     Education Details exercise technique, body mechanics    Person(s) Educated Patient    Methods Explanation;Demonstration;Tactile cues;Verbal cues    Comprehension Verbalized understanding;Returned demonstration;Verbal cues required;Tactile cues required              PT Short Term Goals - 10/21/20 0903       PT SHORT TERM GOAL #1   Title Patient will be independent in home exercise program to improve strength/mobility for better functional independence with ADLs.    Baseline 6/16: HEP to be given next session 7/19: HEP compliant    Time 4    Period Weeks    Status Achieved    Target Date 10/16/20               PT Long Term Goals - 12/10/20 1231       PT LONG TERM GOAL #1   Title Patient will increase FOTO score to equal to or greater than 51%    to demonstrate statistically significant improvement in mobility and quality of life.    Baseline 6/16: 48% 7/19: 57% 8/31: 50%    Time 12    Period Weeks    Status Achieved       PT LONG TERM GOAL #2   Title Patient will increase Berg Balance score by > 6 points (34/56)  to demonstrate decreased fall risk during functional activities.    Baseline 6/16: 28/56 7/19: 45/56    Time 12    Period Weeks    Status Achieved      PT LONG TERM GOAL #3   Title Patient will deny any falls over past 4 weeks to demonstrate improved safety awareness at home and work.    Baseline 6/16: multiple falls 7/19:  no falls reported 9/7: 3 falls in past month    Time 12    Period Weeks    Status On-going    Target Date 03/04/21      PT LONG TERM GOAL #4   Title Patient will increase six minute walk test distance to >1300 for progression to age norm community ambulator and improve gait ability    Baseline 6/16: 945 ft with multiple near falls 7/19: 1495 ft    Time 12    Period Weeks    Status Achieved      PT LONG TERM GOAL #5   Title Patient will increase Berg Balance score by > 6 points (51/56)  to demonstrate decreased fall risk during functional activities.    Baseline 7/19: 45/56 8/31: 49/56    Time 12    Period Weeks    Status Partially Met    Target Date 03/04/21      Additional Long Term Goals   Additional Long Term Goals Yes      PT LONG TERM GOAL #6   Title Patient will increase Functional Gait Assessment score to >20/30 as to reduce fall risk and improve dynamic gait safety with community ambulation.    Baseline 7/19: 16/30 8/31: 20/30    Time 12    Period Weeks    Status Achieved      PT LONG TERM GOAL #7   Title Patient will increase Functional Gait Assessment score to >26/30 as to reduce fall risk and improve dynamic gait safety with community ambulation.    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    Baseline Unable to  perform without leaning on objects    Time 12    Period Weeks    Status New    Target Date 03/04/21      PT LONG TERM GOAL  #9   TITLE Patient will be able to ambulate approximately 1 mile walking dog across changing surfaces such as grass, concrete, and sidewalk without loss of balance.    Baseline 9/7: unable to perform yet    Time 12    Period Weeks    Status New    Target Date 03/04/21      PT LONG TERM GOAL  #10   TITLE Patient will bend down and pick up items without losing balance to perform iADLs such as emptying dishwasher and feeding dog.    Baseline 9/7: unable to perform    Time 12    Period Weeks    Status New    Target Date 03/04/21                   Plan - 01/07/21 1142     Clinical Impression Statement Patient is challenged with stability in static positions. He remains highly motivated throughout physical therapy session. Education on maintaining wide BOS performed with patient verbalizing and demonstrating understanding. Occasional seated rest breaks required throughout session due to fatigue. Patient will benefit from skilled physical therapy to improve balance, gait mechanics, and overall functional mobility    Personal Factors and Comorbidities Age;Comorbidity 3+;Fitness;Past/Current Experience;Time since onset of injury/illness/exacerbation;Transportation    Comorbidities Parkinson's with autonomic instability, asthma, GERD  depression, degenerative spondylolisthesis, focal seizures and migraine syndrome    Examination-Activity Limitations Bathing;Bed Mobility;Bend;Caring for [employer];Locomotion Level;Lift;Hygiene/Grooming;Squat;Stairs;Stand;Toileting;Transfers    Examination-Participation Restrictions Church;Cleaning;Community Activity;Driving;Meal Prep;Medication Management;Laundry;Shop;Volunteer;Yard Work    Merchant navy officer Evolving/Moderate complexity    Rehab Potential Fair    PT Frequency 2x / week     PT Duration 12 weeks    PT Treatment/Interventions ADLs/Self Care Home Management;Biofeedback;Canalith Repostioning;Cryotherapy;Parrafin;Ultrasound;Traction;Moist Heat;Iontophoresis 75m/ml Dexamethasone;Electrical Stimulation;DME Instruction;Gait training;Stair training;Functional mobility training;Neuromuscular re-education;Balance training;Therapeutic exercise;Therapeutic activities;Patient/family education;Orthotic Fit/Training;Manual techniques;Passive range of motion;Manual lymph drainage;Energy conservation;Splinting;Taping;Vasopneumatic Device;Vestibular;Joint Manipulations;Visual/perceptual remediation/compensation    PT Next Visit Plan Continue to advance motor control strength, activities that maximize repitition of inititation and cessation of movement    PT Home Exercise Plan No updates today    Consulted and Agree with Plan of Care Patient    Family Member Consulted wife             Patient will benefit from skilled therapeutic intervention in order to improve the following deficits and impairments:  Abnormal gait, Decreased activity tolerance, Decreased balance, Decreased knowledge of precautions, Decreased endurance, Decreased coordination, Decreased cognition, Decreased knowledge of use of DME, Decreased mobility, Decreased safety awareness, Difficulty walking, Decreased strength, Impaired flexibility, Impaired perceived functional ability, Impaired sensation, Impaired tone, Impaired UE functional use, Impaired vision/preception, Improper body mechanics, Postural dysfunction  Visit Diagnosis: Parkinson's disease ([place]  Muscle weakness (generalized)  Other abnormalities of gait and mobility     Problem List Patient Active Problem List   Diagnosis Date Noted   Focal seizure ([place] 05/24/2020   Parkinson's disease ([place]    History of migraine headaches    Migraine syndrome    BPH (benign prostatic hyperplasia)    Depression    Degenerative spondylolisthesis 05/07/2016    MJanna Arch PT, DPT  01/07/2021, 11:55 AM  [place] [facility] [address] [address]Phone: [phone]  Fax:  [phone] Name: Cory COZORTMRN: [id number] of Birth: 7September 25, 1957

## 2021-01-12 ENCOUNTER — Ambulatory Visit: Payer: Medicare PPO

## 2021-01-14 ENCOUNTER — Other Ambulatory Visit: Payer: Self-pay

## 2021-01-14 ENCOUNTER — Ambulatory Visit: Payer: Medicare PPO

## 2021-01-14 DIAGNOSIS — G20 Parkinson's disease: Secondary | ICD-10-CM | POA: Diagnosis not present

## 2021-01-14 DIAGNOSIS — R2681 Unsteadiness on feet: Secondary | ICD-10-CM

## 2021-01-14 DIAGNOSIS — M6281 Muscle weakness (generalized): Secondary | ICD-10-CM

## 2021-01-14 DIAGNOSIS — R2689 Other abnormalities of gait and mobility: Secondary | ICD-10-CM

## 2021-01-14 DIAGNOSIS — R278 Other lack of coordination: Secondary | ICD-10-CM

## 2021-01-14 NOTE — Therapy (Signed)
[place] MAIN [facility] SERVICES [address] [place], [place], [address] Phone: [phone]   Fax:  [phone]  Physical Therapy Treatment  Patient Details  Name: Cory [name] MRN: [id number] Date of Birth: 1956-03-07 Referring Provider (PT): Benay Spice   Encounter Date: 01/14/2021   PT End of Session - 01/14/21 1105     Visit Number 29    Number of Visits 45    Date for PT Re-Evaluation 03/04/21    Authorization Type [employer] PPO    Authorization Time Period Cert 7/0/26-37/85/88    Progress Note Due on Visit 30    PT Start Time 1102    PT Stop Time 1145    PT Time Calculation (min) 43 min    Equipment Utilized During Treatment Gait belt    Activity Tolerance Patient tolerated treatment well;Patient limited by fatigue    Behavior During Therapy WFL for tasks assessed/performed             Past Medical History:  Diagnosis Date   Arthritis    hips and back   Asthma    as a baby-    GERD (gastroesophageal reflux disease)    rare -    Parkinson's disease ([place])    Pneumonia    age 83   Rash    legs- on going    Past Surgical History:  Procedure Laterality Date   APPENDECTOMY     BACK SURGERY     posterior lumbar spine fusion L4-5   COLONOSCOPY     COLONOSCOPY WITH PROPOFOL N/A 06/06/2017   Procedure: COLONOSCOPY WITH PROPOFOL;  Surgeon: Manya Silvas, MD;  Location: [facility] ENDOSCOPY;  Service: Endoscopy;  Laterality: N/A;   KNEE ARTHROSCOPY Bilateral    ACL   KNEE ARTHROSCOPY W/ ACL RECONSTRUCTION     RADIAL KERATOTOMY     ROTATOR CUFF REPAIR Bilateral    SHOULDER ARTHROSCOPY WITH BICEPS TENDON REPAIR Left 04/16/2019   Procedure: SHOULDER ARTHROSCOPY WITH BICEPS TENDON REPAIR, MINI OPEN SUPERIOR CAPSULAR RECONSTRUCTION, BICEPS TENODESIS;  Surgeon: Leim Fabry, MD;  Location: [facility] ORS;  Service: Orthopedics;  Laterality: Left;    There were no vitals filed for this visit.   Subjective Assessment -  01/14/21 1104     Subjective Patient has been walking on gravel with cane. No falls since last seen.    Patient is accompained by: Family member    Pertinent History Patient admitted to hospital 05/23/20 with code stroke and R sided weakness. Imaging negative for acute changes. PMH includes Parkinson's with autonomic instability, asthma, GERD depression, degenerative spondylolisthesis, focal seizures and migraine syndrome. Patient had three ED visits for falls since then. Patient reports when he has the seizures he has pain in his temples and bright flashes in his vision. Last week had three falls in one day.    Patient Stated Goals stability, have a device that will be most helpful, help to become and stay independent.    Currently in Pain? No/denies              Treatment:  Neuro Re-ed:cues for body mechanics, sequencing, and safety.  Airex pad: close CGA  -static stand 30 seconds; cues for upright posture and stabilization with ankle righting reactions.  -two sticky note head turns. Focus on whole body turn to get both eyes on sticky note for wide head turn. 10 head turns for horizontal head turns, 10 head nods for vertical head movements ; vertical head turns more  challenging.  -UE reach to tap four sticky notes in varying planes of movement, no UE support x60 seconds ; challenging to reach outside BOS x2 trials with widened reach.   Obstacle course:  Weave through three cones, step up onto green step, walk on green step, turn around repeat back to chair perform 5sit to stand jumps ; after first trial bring cones and step closer, add yellow hedgehog for turn x4 trials  -cue for "reset" with turn around hedgehog due to increased shuffle steppage   TherEx: CGA with cues for sequencing, body mechanics, and technique  Standing  BTB around bilateral legs ([place]); finger tip support; cues for facing wall, lateral step 10x each side. ; increased eversion with fatigue; 2 sets each LE    seated:  BTB hamstring curl; focus on count to 3 for concentric/eccentric portion of intervention 10x each LE  LAQ BTB; focus on count to 3 for concentric/eccentric portion 10x each LE; single LE at a time. Cross pattern required due pain in ankles with pressure of theraband.    Pt educated throughout session about proper posture and technique with exercises. Improved exercise technique, movement at target joints, use of target muscles after min to mod verbal, visual, tactile cues.  Patient able to perform head turns with vertical and horizontal scans without LOB this session on unstable surface. Vertical head turns are more challenging for patient. Reaching outside BOS is challenging for patient with increased ankle righting reactions noted. Patient is able to correct for excessive plantarflexion this session with decreased loss of balance.  Patient will benefit from skilled physical therapy to improve balance, gait mechanics, and overall functional mobility                      PT Education - 01/14/21 1103     Education Details exercise technique, body mechanics    Person(s) Educated Patient    Methods Explanation;Demonstration;Tactile cues;Verbal cues    Comprehension Verbalized understanding;Returned demonstration;Verbal cues required;Tactile cues required              PT Short Term Goals - 10/21/20 0903       PT SHORT TERM GOAL #1   Title Patient will be independent in home exercise program to improve strength/mobility for better functional independence with ADLs.    Baseline 6/16: HEP to be given next session 7/19: HEP compliant    Time 4    Period Weeks    Status Achieved    Target Date 10/16/20               PT Long Term Goals - 12/10/20 1231       PT LONG TERM GOAL #1   Title Patient will increase FOTO score to equal to or greater than 51%    to demonstrate statistically significant improvement in mobility and quality of life.    Baseline  6/16: 48% 7/19: 57% 8/31: 50%    Time 12    Period Weeks    Status Achieved      PT LONG TERM GOAL #2   Title Patient will increase Berg Balance score by > 6 points (34/56)  to demonstrate decreased fall risk during functional activities.    Baseline 6/16: 28/56 7/19: 45/56    Time 12    Period Weeks    Status Achieved      PT LONG TERM GOAL #3   Title Patient will deny any falls over past 4 weeks to demonstrate improved safety  awareness at home and work.    Baseline 6/16: multiple falls 7/19: no falls reported 9/7: 3 falls in past month    Time 12    Period Weeks    Status On-going    Target Date 03/04/21      PT LONG TERM GOAL #4   Title Patient will increase six minute walk test distance to >1300 for progression to age norm community ambulator and improve gait ability    Baseline 6/16: 945 ft with multiple near falls 7/19: 1495 ft    Time 12    Period Weeks    Status Achieved      PT LONG TERM GOAL #5   Title Patient will increase Berg Balance score by > 6 points (51/56)  to demonstrate decreased fall risk during functional activities.    Baseline 7/19: 45/56 8/31: 49/56    Time 12    Period Weeks    Status Partially Met    Target Date 03/04/21      Additional Long Term Goals   Additional Long Term Goals Yes      PT LONG TERM GOAL #6   Title Patient will increase Functional Gait Assessment score to >20/30 as to reduce fall risk and improve dynamic gait safety with community ambulation.    Baseline 7/19: 16/30 8/31: 20/30    Time 12    Period Weeks    Status Achieved      PT LONG TERM GOAL #7   Title Patient will increase Functional Gait Assessment score to >26/30 as to reduce fall risk and improve dynamic gait safety with community ambulation.    Baseline 8/31: 20/30    Time 12    Period Weeks    Status New    Target Date 03/04/21      PT LONG TERM GOAL #8   Title Patient will static stand for approximately 5 minutes and perturbations for shooting/teaching  classes without loss of balance or requiring object to lean on.    Baseline Unable to perform without leaning on objects    Time 12    Period Weeks    Status New    Target Date 03/04/21      PT LONG TERM GOAL  #9   TITLE Patient will be able to ambulate approximately 1 mile walking dog across changing surfaces such as grass, concrete, and sidewalk without loss of balance.    Baseline 9/7: unable to perform yet    Time 12    Period Weeks    Status New    Target Date 03/04/21      PT LONG TERM GOAL  #10   TITLE Patient will bend down and pick up items without losing balance to perform iADLs such as emptying dishwasher and feeding dog.    Baseline 9/7: unable to perform    Time 12    Period Weeks    Status New    Target Date 03/04/21                   Plan - 01/14/21 1135     Clinical Impression Statement Patient able to perform head turns with vertical and horizontal scans without LOB this session on unstable surface. Vertical head turns are more challenging for patient. Reaching outside BOS is challenging for patient with increased ankle righting reactions noted. Patient is able to correct for excessive plantarflexion this session with decreased loss of balance.  Patient will benefit from skilled physical therapy to improve balance,  gait mechanics, and overall functional mobility    Personal Factors and Comorbidities Age;Comorbidity 3+;Fitness;Past/Current Experience;Time since onset of injury/illness/exacerbation;Transportation    Comorbidities Parkinson's with autonomic instability, asthma, GERD depression, degenerative spondylolisthesis, focal seizures and migraine syndrome    Examination-Activity Limitations Bathing;Bed Mobility;Bend;Caring for [employer];Locomotion Level;Lift;Hygiene/Grooming;Squat;Stairs;Stand;Toileting;Transfers    Examination-Participation Restrictions Church;Cleaning;Community Activity;Driving;Meal Prep;Medication  Management;Laundry;Shop;Volunteer;Yard Work    Merchant navy officer Evolving/Moderate complexity    Rehab Potential Fair    PT Frequency 2x / week    PT Duration 12 weeks    PT Treatment/Interventions ADLs/Self Care Home Management;Biofeedback;Canalith Repostioning;Cryotherapy;Parrafin;Ultrasound;Traction;Moist Heat;Iontophoresis 47m/ml Dexamethasone;Electrical Stimulation;DME Instruction;Gait training;Stair training;Functional mobility training;Neuromuscular re-education;Balance training;Therapeutic exercise;Therapeutic activities;Patient/family education;Orthotic Fit/Training;Manual techniques;Passive range of motion;Manual lymph drainage;Energy conservation;Splinting;Taping;Vasopneumatic Device;Vestibular;Joint Manipulations;Visual/perceptual remediation/compensation    PT Next Visit Plan Continue to advance motor control strength, activities that maximize repitition of inititation and cessation of movement    PT Home Exercise Plan No updates today    Consulted and Agree with Plan of Care Patient    Family Member Consulted wife             Patient will benefit from skilled therapeutic intervention in order to improve the following deficits and impairments:  Abnormal gait, Decreased activity tolerance, Decreased balance, Decreased knowledge of precautions, Decreased endurance, Decreased coordination, Decreased cognition, Decreased knowledge of use of DME, Decreased mobility, Decreased safety awareness, Difficulty walking, Decreased strength, Impaired flexibility, Impaired perceived functional ability, Impaired sensation, Impaired tone, Impaired UE functional use, Impaired vision/preception, Improper body mechanics, Postural dysfunction  Visit Diagnosis: Muscle weakness (generalized)  Other abnormalities of gait and mobility  Unsteadiness on feet  Parkinson's disease ([place]     Problem List Patient Active Problem List   Diagnosis Date Noted   Focal seizure ([place]  05/24/2020   Parkinson's disease ([place]    History of migraine headaches    Migraine syndrome    BPH (benign prostatic hyperplasia)    Depression    Degenerative spondylolisthesis 05/07/2016    MJanna Arch PT, DPT  01/14/2021, 12:10 PM  [place] [facility] [address] [address]Phone: [phone]  Fax:  [phone] Name: TVAIDEN ADAMESMRN: [id number] of Birth: 710-08-1955

## 2021-01-15 NOTE — Therapy (Signed)
[place] MAIN [facility] SERVICES [address] [place], [place], [address] Phone: [phone]   Fax:  [phone]  Occupational Therapy Treatment  Patient Details  Name: Cory [name] MRN: [id number] Date of Birth: 1955-08-09 No data recorded  Encounter Date: 01/14/2021   OT End of Session - 01/15/21 1018     Visit Number 23    Number of Visits 43    Date for OT Re-Evaluation 03/15/21    Authorization Time Period Reporting period starting 01/05/2021    OT Start Time 1145    OT Stop Time 1233    OT Time Calculation (min) 48 min    Equipment Utilized During Treatment 4 wheeled walker    Activity Tolerance Patient tolerated treatment well    Behavior During Therapy WFL for tasks assessed/performed             Past Medical History:  Diagnosis Date   Arthritis    hips and back   Asthma    as a baby-    GERD (gastroesophageal reflux disease)    rare -    Parkinson's disease ([place])    Pneumonia    age 50   Rash    legs- on going    Past Surgical History:  Procedure Laterality Date   APPENDECTOMY     BACK SURGERY     posterior lumbar spine fusion L4-5   COLONOSCOPY     COLONOSCOPY WITH PROPOFOL N/A 06/06/2017   Procedure: COLONOSCOPY WITH PROPOFOL;  Surgeon: Manya Silvas, MD;  Location: [facility] ENDOSCOPY;  Service: Endoscopy;  Laterality: N/A;   KNEE ARTHROSCOPY Bilateral    ACL   KNEE ARTHROSCOPY W/ ACL RECONSTRUCTION     RADIAL KERATOTOMY     ROTATOR CUFF REPAIR Bilateral    SHOULDER ARTHROSCOPY WITH BICEPS TENDON REPAIR Left 04/16/2019   Procedure: SHOULDER ARTHROSCOPY WITH BICEPS TENDON REPAIR, MINI OPEN SUPERIOR CAPSULAR RECONSTRUCTION, BICEPS TENODESIS;  Surgeon: Leim Fabry, MD;  Location: [facility] ORS;  Service: Orthopedics;  Laterality: Left;    There were no vitals filed for this visit.   Subjective Assessment - 01/14/21 1013     Subjective  "I keep missing my mouth when I eat."    Patient is accompanied by: Family  member    Pertinent History Parkinson's Disease, R sided weakness, falls    Limitations balance, strength, coordination, visual perception, problem solving, processing information    Patient Stated Goals "I want to get stronger.  It frustrates me when I have to ask for help with things like cutting my food or buttoning a button."    Currently in Pain? No/denies    Pain Score 0-No pain    Pain Onset More than a month ago           Occupational Therapy Treatment: Self Care: Educated on AE for rollator attachments for easier item transport, including walker bags, baskets, cup holder attachments and advised on how to obtain.  Made recommendation to try using small vanity mirror at home, place on table top for self feeding visual feedback as pt reports he routinely misses his face.  Educated pt on proprioceptive/spatial awareness activities to address this deficit.  Pt receptive to techniques.   Therapeutic Exercise: Participated in pinch strengthening for R/L hands with use of therapy resistant clothespins to address lateral and 3 point pinch.  Repeated attempts/extra time to clip black clothespins (heavy resistance).  Addressed small item pick up with washers on table top and placed over vertical  dowels to address depth perception and [place].  Extra time and repeated attempts when picking up washers from table top surface (non-skid mat not used).  Response to Treatment: Pt reports he dressed himself on Sunday, but took about 5 min to loop his belt.  Pt reports increasing difficulty bringing food to mouth, with therapist noting decreased spatial awareness/proprioception; educated on activities to address this at home with pt being receptive to activities.  Will continue to progress HEP for hand strengthening, GM/FMC activities in order to maximize indep with self care and object manipulation, as well as instruct in ADL strategies to compensate for functional decline related to PD.    OT Education -  01/14/21 1017     Education Details spatial awareness/propioceptive activities for home    Person(s) Educated Patient;Spouse    Methods Explanation;Verbal cues;Demonstration    Comprehension Verbalized understanding;Need further instruction;Verbal cues required              OT Short Term Goals - 12/31/20 0822       OT SHORT TERM GOAL #1   Title Pt will perform BUE HEP with min vc    Baseline Eval: not yet initiated; 11/19/2020: Pt able to perform with min vc; 12/22/20: indep with cane exercises, need to instruct further in theraband exercises    Time 6    Period Weeks    Status On-going    Target Date 02/01/21      OT [place] #2   Title Pt will report RPE of 4 or better to indicate improved tolerance to basic ADLs.    Baseline Eval: RPE of 6 with basic ADLs; 11/19/2020: RPE of 5 with basic ADLs; 12/22/20: RPE for ADLs fluctuates greatly from 4 on a good day, to 10 on a bad day; 12/31/20: RPE continues to fluctate greatly throughout the week 4-10    Time 6    Period Weeks    Status On-going    Target Date 02/01/21      OT SHORT TERM GOAL #3   Title Pt will utilize visual compensation strategies with min vc in order to improve basic and IADL performance.    Baseline Eval: pt reports some diplopia and decreased depth perception which limits ADL/IADL performance; Pt has been instructed in diplopia exercises and requires mod vc for compensation strategies when diplopia is present; 12/22/20: pt is indep with visual exercises and knowing when to allow rest breaks or use compensatory strategies, vs request caregiver assist for difficult tasks.    Time 6    Period Weeks    Status Achieved    Target Date 12/22/20               OT Long Term Goals - 12/31/20 0828       OT LONG TERM GOAL #1   Title Pt will increase bilat shoulder strength by 1MM grade in order to improve tolerance to UB ADLs/IADLs and reaching activities.    Baseline Eval: R/L shoulder flex/abd 4-/5, R/L  shoulder ER 3+/5; stamina is improving but strength grades still same as eval; R/L shoulder flex/abd 4-, L ER 4-, R ER 3+, L/R IR 4 (pt reports now able to reach easier into overhead kitchen cabinets).    Time 12    Period Weeks    Status On-going    Target Date 03/15/21      OT LONG TERM GOAL #2   Title Pt will manage clothing fasteners and looping belt with modified indep.  Baseline Eval: spouse assists.  Difficulty with tying shoes, looping belt, snapping and buttoning buttons; Using compensatory strategies for shoe tying as pt now has elastic shoe laces.  Pt now managing his own belt with slight extra time.  Increased time to fasten buttons; 12/22/2020: Pt is consistent to loop belt using compensatory strategies, but struggles with buttoning shirts consistently and typically spouse will assist    Time 12    Period Weeks    Status On-going    Target Date 03/15/21      OT LONG TERM GOAL #3   Title Pt will cut food with modified indep.    Baseline Eval: spouse assists to cut food; Indep to cut food; 12/22/2020: Pt now struggles to cut meat; 12/31/20: Pt can cut chicken with min vc to use large/exaggerated sawing motions but spouse cuts steak.    Time 12    Period Weeks    Status On-going    Target Date 03/15/21      OT LONG TERM GOAL #4   Title Pt will brush hair with R dominant arm with modified indep.    Baseline Eval: Pt states he must hold R arm up with L hand, and often spouse must assist; 11/19/2020: Pt can reach to top and back of head with RUE but arm fatigues quickly with repetition required to brush hair.    Time 12    Period Weeks    Status Achieved    Target Date 12/22/20      OT LONG TERM GOAL #5   Title Pt will increase FOTO score to 62 or better to indicate measureable functional improvement with ADLs/IADLs.    Baseline Eval: FOTO score of 58; 12/22/2020: 57    Time 12    Period Weeks    Status On-going    Target Date 03/15/21      OT LONG TERM GOAL #6   Title Pt  will improve time management skills utilizing external compensatory strategies such as calendars, schedules, alarms as needed with min A.    Baseline 12/22/2020: Not yet initiated (spouse reports that pt is taking longer to complete ADLs yet he is not allowing extra time to complete, causing a rush to get out the door).    Time 12    Period Weeks    Status On-going    Target Date 03/15/21      OT LONG TERM GOAL #7   Title Pt will improve dynamic standing balance to enable pt to feed dogs with modified indep.    Baseline 12/22/2020: Several falls over the last week; pt is inconsistent to use his rollator for dynamic standing tasks.  Would need supv to feed dogs.    Time 12    Period Weeks    Status On-going    Target Date 03/15/21              Plan - 01/14/21 1039     Clinical Impression Statement Pt reports he dressed himself on Sunday, but took about 5 min to loop his belt.  Pt reports increasing difficulty bringing food to mouth, with therapist noting decreased spatial awareness/proprioception; educated on activities to address this at home with pt being receptive to activities.  Will continue to progress HEP for hand strengthening, GM/FMC activities in order to maximize indep with self care and object manipulation, as well as instruct in ADL strategies to compensate for functional decline related to PD.    OT Occupational Profile and History Detailed Assessment- Review  of Records and additional review of physical, cognitive, psychosocial history related to current functional performance    Occupational performance deficits (Please refer to evaluation for details): ADL's;IADL's;Leisure    Body Structure / Function / Physical Skills ADL;Coordination;Endurance;GMC;UE functional use;Balance;Sensation;IADL;Vision;Dexterity;FMC;Strength;Continence;Gait;Mobility    Rehab Potential Good    Clinical Decision Making Several treatment options, min-mod task modification necessary    Comorbidities  Affecting Occupational Performance: May have comorbidities impacting occupational performance    Modification or Assistance to Complete Evaluation  Min-Moderate modification of tasks or assist with assess necessary to complete eval    OT Frequency 2x / week    OT Duration 12 weeks    OT Treatment/Interventions Self-care/ADL training;Therapeutic exercise;DME and/or AE instruction;Balance training;Neuromuscular education;Manual Therapy;Visual/perceptual remediation/compensation;Therapeutic activities;Patient/family education    Consulted and Agree with Plan of Care Patient             Patient will benefit from skilled therapeutic intervention in order to improve the following deficits and impairments:   Body Structure / Function / Physical Skills: ADL, Coordination, Endurance, GMC, UE functional use, Balance, Sensation, IADL, Vision, Dexterity, FMC, Strength, Continence, Gait, Mobility       Visit Diagnosis: Muscle weakness (generalized)  Other lack of coordination  Parkinson's disease [facility])    Problem List Patient Active Problem List   Diagnosis Date Noted   Focal seizure ([place]) 05/24/2020   Parkinson's disease ([place])    History of migraine headaches    Migraine syndrome    BPH (benign prostatic hyperplasia)    Depression    Degenerative spondylolisthesis 05/07/2016   Leta Speller, MS, OTR/L  Darleene Cleaver, OT/L 01/15/2021, 10:40 AM  [place] [address] [place], [place], [address] Phone: [phone]   Fax:  [phone]  Name: JOTHAM AHN MRN: [id number] Date of Birth: 1955-04-19

## 2021-01-16 ENCOUNTER — Ambulatory Visit: Payer: Medicare PPO

## 2021-01-16 ENCOUNTER — Encounter: Payer: Self-pay | Admitting: Physical Therapy

## 2021-01-16 ENCOUNTER — Other Ambulatory Visit: Payer: Self-pay

## 2021-01-16 DIAGNOSIS — M6281 Muscle weakness (generalized): Secondary | ICD-10-CM

## 2021-01-16 DIAGNOSIS — R2689 Other abnormalities of gait and mobility: Secondary | ICD-10-CM

## 2021-01-16 DIAGNOSIS — G20 Parkinson's disease: Secondary | ICD-10-CM

## 2021-01-16 DIAGNOSIS — R278 Other lack of coordination: Secondary | ICD-10-CM

## 2021-01-16 NOTE — Therapy (Signed)
[place] MAIN [facility] SERVICES [address] [place], [place], [address] Phone: [phone]   Fax:  [phone]  Occupational Therapy Treatment  Patient Details  Name: Cory [name] MRN: [id number] Date of Birth: 01/02/1956 No data recorded  Encounter Date: 01/16/2021   OT End of Session - 01/16/21 1155     Visit Number 24    Number of Visits 48    Date for OT Re-Evaluation 03/15/21    Authorization Time Period Reporting period starting 01/05/2021    OT Start Time 1103    OT Stop Time 1150    OT Time Calculation (min) 47 min    Equipment Utilized During Treatment 4 wheeled walker    Activity Tolerance Patient tolerated treatment well    Behavior During Therapy WFL for tasks assessed/performed             Past Medical History:  Diagnosis Date   Arthritis    hips and back   Asthma    as a baby-    GERD (gastroesophageal reflux disease)    rare -    Parkinson's disease ([place])    Pneumonia    age 36   Rash    legs- on going    Past Surgical History:  Procedure Laterality Date   APPENDECTOMY     BACK SURGERY     posterior lumbar spine fusion L4-5   COLONOSCOPY     COLONOSCOPY WITH PROPOFOL N/A 06/06/2017   Procedure: COLONOSCOPY WITH PROPOFOL;  Surgeon: Manya Silvas, MD;  Location: [facility] ENDOSCOPY;  Service: Endoscopy;  Laterality: N/A;   KNEE ARTHROSCOPY Bilateral    ACL   KNEE ARTHROSCOPY W/ ACL RECONSTRUCTION     RADIAL KERATOTOMY     ROTATOR CUFF REPAIR Bilateral    SHOULDER ARTHROSCOPY WITH BICEPS TENDON REPAIR Left 04/16/2019   Procedure: SHOULDER ARTHROSCOPY WITH BICEPS TENDON REPAIR, MINI OPEN SUPERIOR CAPSULAR RECONSTRUCTION, BICEPS TENODESIS;  Surgeon: Leim Fabry, MD;  Location: [facility] ORS;  Service: Orthopedics;  Laterality: Left;    There were no vitals filed for this visit.   Subjective Assessment - 01/16/21 1153     Subjective  Pt reports noted improvement in energy level since starting new  Levadopa/carbadopa regimen last week.    Patient is accompanied by: Family member    Pertinent History Parkinson's Disease, R sided weakness, falls    Limitations balance, strength, coordination, visual perception, problem solving, processing information    Patient Stated Goals "I want to get stronger.  It frustrates me when I have to ask for help with things like cutting my food or buttoning a button."    Currently in Pain? No/denies    Pain Score 0-No pain    Pain Onset More than a month ago            Occupational Therapy Treatment: Therapeutic Exercise: Participation in pinch strengthening activities for improving manipulation of clothing fasteners, using therapy resistant clothespins utilizing lateral and 3 point pinch for R/L hands x2 trials.  1 trial facilitated increasing bilat shoulder flexion strength placing pins on vertical bar above shoulder level.  Used hand gripper set at 17.9 lbs to facilitate grip strengthening for R/L hands to improve grasp of ADL supplies, removing jumbo pegs from pegboard, 2 trials each hand.    Self Care: Reviewed time management strategies to avoid rushing in the morning, OT helping pt to identify 5 important tasks to prep pt for leaving the house in the morning, and making sure  to complete his 5 tasks prior to engaging in any other tasks.  Spouse reported that they rushed this morning as pt began working on the computer before he finished dressing, and the computer was not in his top 5.  Pt acknowledged need to stick to his top 5 tasks before extra-curricular tasks to avoid rushing.  Also encouraged setting phone alarms for medication reminders, which pt states he will do once he gets his new phone this weekend.  Reviewed compensatory strategies for challenging tasks that have been made more difficult as a result of diminished spatial relations, including pushing buttons on his tv remote and directing it toward the tv.  Reinforced importance of using vision  to target fingers on the correct remote buttons, then switching gaze toward tv once fingers are in the appropriate spot.  Pt verbalized understanding of needing to compensate with vision to hit correct buttons and point remote in the correct direction.    Response to Treatment: See Plan/clinical impression below.    OT Education - 01/16/21 1155     Education Details compensatory strategies for decreased spatial awareness    Person(s) Educated Patient;Spouse    Methods Explanation;Verbal cues;Demonstration    Comprehension Verbalized understanding;Need further instruction;Verbal cues required;Returned demonstration              OT Short Term Goals - 12/31/20 3875       OT SHORT TERM GOAL #1   Title Pt will perform BUE HEP with min vc    Baseline Eval: not yet initiated; 11/19/2020: Pt able to perform with min vc; 12/22/20: indep with cane exercises, need to instruct further in theraband exercises    Time 6    Period Weeks    Status On-going    Target Date 02/01/21      OT SHORT TERM GOAL #2   Title Pt will report RPE of 4 or better to indicate improved tolerance to basic ADLs.    Baseline Eval: RPE of 6 with basic ADLs; 11/19/2020: RPE of 5 with basic ADLs; 12/22/20: RPE for ADLs fluctuates greatly from 4 on a good day, to 10 on a bad day; 12/31/20: RPE continues to fluctate greatly throughout the week 4-10    Time 6    Period Weeks    Status On-going    Target Date 02/01/21      OT SHORT TERM GOAL #3   Title Pt will utilize visual compensation strategies with min vc in order to improve basic and IADL performance.    Baseline Eval: pt reports some diplopia and decreased depth perception which limits ADL/IADL performance; Pt has been instructed in diplopia exercises and requires mod vc for compensation strategies when diplopia is present; 12/22/20: pt is indep with visual exercises and knowing when to allow rest breaks or use compensatory strategies, vs request caregiver assist for  difficult tasks.    Time 6    Period Weeks    Status Achieved    Target Date 12/22/20               OT Long Term Goals - 12/31/20 0828       OT LONG TERM GOAL #1   Title Pt will increase bilat shoulder strength by 1MM grade in order to improve tolerance to UB ADLs/IADLs and reaching activities.    Baseline Eval: R/L shoulder flex/abd 4-/5, R/L shoulder ER 3+/5; stamina is improving but strength grades still same as eval; R/L shoulder flex/abd 4-, L ER 4-, R ER 3+,  L/R IR 4 (pt reports now able to reach easier into overhead kitchen cabinets).    Time 12    Period Weeks    Status On-going    Target Date 03/15/21      OT LONG TERM GOAL #2   Title Pt will manage clothing fasteners and looping belt with modified indep.    Baseline Eval: spouse assists.  Difficulty with tying shoes, looping belt, snapping and buttoning buttons; Using compensatory strategies for shoe tying as pt now has elastic shoe laces.  Pt now managing his own belt with slight extra time.  Increased time to fasten buttons; 12/22/2020: Pt is consistent to loop belt using compensatory strategies, but struggles with buttoning shirts consistently and typically spouse will assist    Time 12    Period Weeks    Status On-going    Target Date 03/15/21      OT LONG TERM GOAL #3   Title Pt will cut food with modified indep.    Baseline Eval: spouse assists to cut food; Indep to cut food; 12/22/2020: Pt now struggles to cut meat; 12/31/20: Pt can cut chicken with min vc to use large/exaggerated sawing motions but spouse cuts steak.    Time 12    Period Weeks    Status On-going    Target Date 03/15/21      OT LONG TERM GOAL #4   Title Pt will brush hair with R dominant arm with modified indep.    Baseline Eval: Pt states he must hold R arm up with L hand, and often spouse must assist; 11/19/2020: Pt can reach to top and back of head with RUE but arm fatigues quickly with repetition required to brush hair.    Time 12     Period Weeks    Status Achieved    Target Date 12/22/20      OT LONG TERM GOAL #5   Title Pt will increase FOTO score to 62 or better to indicate measureable functional improvement with ADLs/IADLs.    Baseline Eval: FOTO score of 58; 12/22/2020: 57    Time 12    Period Weeks    Status On-going    Target Date 03/15/21      OT LONG TERM GOAL #6   Title Pt will improve time management skills utilizing external compensatory strategies such as calendars, schedules, alarms as needed with min A.    Baseline 12/22/2020: Not yet initiated (spouse reports that pt is taking longer to complete ADLs yet he is not allowing extra time to complete, causing a rush to get out the door).    Time 12    Period Weeks    Status On-going    Target Date 03/15/21      OT LONG TERM GOAL #7   Title Pt will improve dynamic standing balance to enable pt to feed dogs with modified indep.    Baseline 12/22/2020: Several falls over the last week; pt is inconsistent to use his rollator for dynamic standing tasks.  Would need supv to feed dogs.    Time 12    Period Weeks    Status On-going    Target Date 03/15/21             Plan - 01/16/21 1211     Clinical Impression Statement Pt continues to develop time management strategies, but is requiring max A for carry over.  Pt is also developing compensatory strategies to manage self care tasks that are increasing in difficulty  as a result of worsening PD symptoms.  Further education needed to develop and carry over compensatory strategies for self care and to adapt HEP as necessary.    OT Occupational Profile and History Detailed Assessment- Review of Records and additional review of physical, cognitive, psychosocial history related to current functional performance    Occupational performance deficits (Please refer to evaluation for details): ADL's;IADL's;Leisure    Body Structure / Function / Physical Skills ADL;Coordination;Endurance;GMC;UE functional  use;Balance;Sensation;IADL;Vision;Dexterity;FMC;Strength;Continence;Gait;Mobility    Rehab Potential Good    Clinical Decision Making Several treatment options, min-mod task modification necessary    Comorbidities Affecting Occupational Performance: May have comorbidities impacting occupational performance    Modification or Assistance to Complete Evaluation  Min-Moderate modification of tasks or assist with assess necessary to complete eval    OT Frequency 2x / week    OT Duration 12 weeks    OT Treatment/Interventions Self-care/ADL training;Therapeutic exercise;DME and/or AE instruction;Balance training;Neuromuscular education;Manual Therapy;Visual/perceptual remediation/compensation;Therapeutic activities;Patient/family education    Consulted and Agree with Plan of Care Patient             Patient will benefit from skilled therapeutic intervention in order to improve the following deficits and impairments:   Body Structure / Function / Physical Skills: ADL, Coordination, Endurance, GMC, UE functional use, Balance, Sensation, IADL, Vision, Dexterity, FMC, Strength, Continence, Gait, Mobility       Visit Diagnosis: Muscle weakness (generalized)  Other lack of coordination  Parkinson's disease [facility])    Problem List Patient Active Problem List   Diagnosis Date Noted   Focal seizure ([place]) 05/24/2020   Parkinson's disease ([place])    History of migraine headaches    Migraine syndrome    BPH (benign prostatic hyperplasia)    Depression    Degenerative spondylolisthesis 05/07/2016   Leta Speller, MS, OTR/L  Darleene Cleaver, OT/L 01/16/2021, 12:11 PM  [place] [address] [place], [place], [address] Phone: [phone]   Fax:  [phone]  Name: Cory [name] MRN: [id number] Date of Birth: October 22, 1955

## 2021-01-16 NOTE — Therapy (Signed)
[place] MAIN [facility] SERVICES [address] [place], [place], [address] Phone: [phone]   Fax:  [phone]  Physical Therapy Treatment/Physical Therapy Progress Note   Dates of reporting period  12/03/2020   to   01/16/2021  Patient Details  Name: Cory [name] MRN: [id number] Date of Birth: [birth year] Referring Provider (PT): Benay Spice   Encounter Date: 01/16/2021   PT End of Session - 01/16/21 1018     Visit Number 30    Number of Visits 97    Date for PT Re-Evaluation 03/04/21    Authorization Type [employer] PPO    Authorization Time Period Cert 05/13/29-51/76/16    Progress Note Due on Visit 89    PT Start Time [place] During Treatment Gait belt    Activity Tolerance Patient tolerated treatment well;Patient limited by fatigue    Behavior During Therapy WFL for tasks assessed/performed             Past Medical History:  Diagnosis Date   Arthritis    hips and back   Asthma    as a baby-    GERD (gastroesophageal reflux disease)    rare -    Parkinson's disease ([place])    Pneumonia    age 25   Rash    legs- on going    Past Surgical History:  Procedure Laterality Date   APPENDECTOMY     BACK SURGERY     posterior lumbar spine fusion L4-5   COLONOSCOPY     COLONOSCOPY WITH PROPOFOL N/A 06/06/2017   Procedure: COLONOSCOPY WITH PROPOFOL;  Surgeon: Manya Silvas, MD;  Location: [facility] ENDOSCOPY;  Service: Endoscopy;  Laterality: N/A;   KNEE ARTHROSCOPY Bilateral    ACL   KNEE ARTHROSCOPY W/ ACL RECONSTRUCTION     RADIAL KERATOTOMY     ROTATOR CUFF REPAIR Bilateral    SHOULDER ARTHROSCOPY WITH BICEPS TENDON REPAIR Left 04/16/2019   Procedure: SHOULDER ARTHROSCOPY WITH BICEPS TENDON REPAIR, MINI OPEN SUPERIOR CAPSULAR RECONSTRUCTION, BICEPS TENODESIS;  Surgeon: Leim Fabry, MD;  Location: [facility] ORS;  Service: Orthopedics;  Laterality: Left;    There were no vitals filed for this  visit.   Subjective Assessment - 01/16/21 1016     Subjective Pt denies any changes since previous session. No falls, has been using cane at home.    Patient is accompained by: Family member    Pertinent History Patient admitted to hospital 05/23/20 with code stroke and R sided weakness. Imaging negative for acute changes. PMH includes Parkinson's with autonomic instability, asthma, GERD depression, degenerative spondylolisthesis, focal seizures and migraine syndrome. Patient had three ED visits for falls since then. Patient reports when he has the seizures he has pain in his temples and bright flashes in his vision. Last week had three falls in one day.    Patient Stated Goals stability, have a device that will be most helpful, help to become and stay independent.    Currently in Pain? No/denies                [facility] PT Assessment - 01/16/21 1033       Functional Gait  Assessment   Gait assessed  Yes    Gait Level Surface Walks 20 ft in less than 5.5 sec, no assistive devices, good speed, no evidence for imbalance, normal gait pattern, deviates no more than 6 in outside of the 12 in walkway width.    Change in  Gait Speed Able to smoothly change walking speed without loss of balance or gait deviation. Deviate no more than 6 in outside of the 12 in walkway width.    Gait with Horizontal Head Turns Performs head turns smoothly with slight change in gait velocity (eg, minor disruption to smooth gait path), deviates 6-10 in outside 12 in walkway width, or uses an assistive device.    Gait with Vertical Head Turns Performs task with slight change in gait velocity (eg, minor disruption to smooth gait path), deviates 6 - 10 in outside 12 in walkway width or uses assistive device    Gait and Pivot Turn Pivot turns safely in greater than 3 sec and stops with no loss of balance, or pivot turns safely within 3 sec and stops with mild imbalance, requires small steps to catch balance.    Step Over  Obstacle Is able to step over one shoe box (4.5 in total height) without changing gait speed. No evidence of imbalance.    Gait with Narrow Base of Support Ambulates 4-7 steps.    Gait with Eyes Closed Walks 20 ft, no assistive devices, good speed, no evidence of imbalance, normal gait pattern, deviates no more than 6 in outside 12 in walkway width. Ambulates 20 ft in less than 7 sec.    Ambulating Backwards Walks 20 ft, uses assistive device, slower speed, mild gait deviations, deviates 6-10 in outside 12 in walkway width.    Steps Alternating feet, must use rail.    Total Score 22            Neuro Re-Ed:   Reassessment of long term goals. Please see clinical impression and goals section for further updates:   FGA: 22/30    Berg: 52/56   Endorses ability to bend over to empty dishwasher/feed dog without LOB or falls   Obstacle course: alternating cone taps (3 cones in triangle)/LE --> tandem walking over airex beam --> step up and over 6" step --> cone weaves varying form wide apart to narrow: x2 attempts with CGA provided.      Patient's condition has the potential to improve in response to therapy. Maximum improvement is yet to be obtained. The anticipated improvement is attainable and reasonable in a generally predictable time.      PT Short Term Goals - 10/21/20 0903       PT SHORT TERM GOAL #1   Title Patient will be independent in home exercise program to improve strength/mobility for better functional independence with ADLs.    Baseline 6/16: HEP to be given next session 7/19: HEP compliant    Time 4    Period Weeks    Status Achieved    Target Date 10/16/20               PT Long Term Goals - 01/16/21 1018       PT LONG TERM GOAL #1   Title Patient will increase FOTO score to equal to or greater than 51%    to demonstrate statistically significant improvement in mobility and quality of life.    Baseline 6/16: 48% 7/19: 57% 8/31: 50%    Time 12    Period  Weeks    Status Achieved      PT LONG TERM GOAL #2   Title Patient will increase Berg Balance score by > 6 points (34/56)  to demonstrate decreased fall risk during functional activities.    Baseline 6/16: 28/56 7/19: 45/56    Time 12  Period Weeks    Status Achieved      PT LONG TERM GOAL #3   Title Patient will deny any falls over past 4 weeks to demonstrate improved safety awareness at home and work.    Baseline 6/16: multiple falls 7/19: no falls reported 9/7: 3 falls in past month; 2 falls in past month    Time 12    Period Weeks    Status On-going    Target Date 03/04/21      PT LONG TERM GOAL #4   Title Patient will increase six minute walk test distance to >1300 for progression to age norm community ambulator and improve gait ability    Baseline 6/16: 945 ft with multiple near falls 7/19: 1495 ft    Time 12    Period Weeks    Status Achieved      PT LONG TERM GOAL #5   Title Patient will increase Berg Balance score by > 6 points (51/56)  to demonstrate decreased fall risk during functional activities.    Baseline 7/19: 45/56 8/31: 49/56    Time 12    Period Weeks    Status Partially Met      PT LONG TERM GOAL #6   Title Patient will increase Functional Gait Assessment score to >20/30 as to reduce fall risk and improve dynamic gait safety with community ambulation.    Baseline 7/19: 16/30 8/31: 20/30    Time 12    Period Weeks    Status Achieved      PT LONG TERM GOAL #7   Title Patient will increase Functional Gait Assessment score to >26/30 as to reduce fall risk and improve dynamic gait safety with community ambulation.    Baseline 8/31: 20/30    Time 12    Period Weeks    Status New      PT LONG TERM GOAL #8   Title Patient will static stand for approximately 5 minutes and perturbations for shooting/teaching classes without loss of balance or requiring object to lean on.    Baseline Unable to perform without leaning on objects    Time 12    Period Weeks     Status New      PT LONG TERM GOAL  #9   TITLE Patient will be able to ambulate approximately 1 mile walking dog across changing surfaces such as grass, concrete, and sidewalk without loss of balance.    Baseline 9/7: unable to perform yet    Time 12    Period Weeks    Status New      PT LONG TERM GOAL  #10   TITLE Patient will bend down and pick up items without losing balance to perform iADLs such as emptying dishwasher and feeding dog.    Baseline 9/7: unable to perform    Time 12    Period Weeks    Status New                    Patient will benefit from skilled therapeutic intervention in order to improve the following deficits and impairments:     Visit Diagnosis: Muscle weakness (generalized)  Other lack of coordination  Parkinson's disease ([place])  Other abnormalities of gait and mobility     Problem List Patient Active Problem List   Diagnosis Date Noted   Focal seizure ([place]) 05/24/2020   Parkinson's disease ([place])    History of migraine headaches    Migraine syndrome    BPH (  benign prostatic hyperplasia)    Depression    Degenerative spondylolisthesis 05/07/2016    Salem Caster. Fairly IV, PT, DPT Physical Therapist- [facility]  01/16/2021, 10:55 AM  [place] MAIN [facility] SERVICES [address] [place], [place], [address] Phone: [phone]   Fax:  [phone]  Name: Cory [name] MRN: [id number] Date of Birth: 1955/08/09

## 2021-01-19 ENCOUNTER — Other Ambulatory Visit: Payer: Self-pay

## 2021-01-19 ENCOUNTER — Ambulatory Visit: Payer: Medicare PPO

## 2021-01-19 DIAGNOSIS — M6281 Muscle weakness (generalized): Secondary | ICD-10-CM

## 2021-01-19 DIAGNOSIS — R2681 Unsteadiness on feet: Secondary | ICD-10-CM

## 2021-01-19 DIAGNOSIS — G20 Parkinson's disease: Secondary | ICD-10-CM

## 2021-01-19 DIAGNOSIS — R278 Other lack of coordination: Secondary | ICD-10-CM

## 2021-01-19 DIAGNOSIS — R2689 Other abnormalities of gait and mobility: Secondary | ICD-10-CM

## 2021-01-19 DIAGNOSIS — G20A1 Parkinson's disease without dyskinesia, without mention of fluctuations: Secondary | ICD-10-CM

## 2021-01-19 NOTE — Therapy (Signed)
[place] MAIN [facility] SERVICES [address] [place], [place], [address] Phone: [phone]   Fax:  [phone]  Occupational Therapy Treatment  Patient Details  Name: Cory [name] MRN: [id number] Date of Birth: 06/11/1955 No data recorded  Encounter Date: 01/19/2021   OT End of Session - 01/19/21 1449     Visit Number 25    Number of Visits 79    Date for OT Re-Evaluation 03/15/21    Authorization Time Period Reporting period starting 01/05/2021    OT Start Time 1145    OT Stop Time 1233    OT Time Calculation (min) 48 min    Equipment Utilized During Treatment 4 wheeled walker    Activity Tolerance Patient tolerated treatment well    Behavior During Therapy WFL for tasks assessed/performed             Past Medical History:  Diagnosis Date   Arthritis    hips and back   Asthma    as a baby-    GERD (gastroesophageal reflux disease)    rare -    Parkinson's disease ([place])    Pneumonia    age 89   Rash    legs- on going    Past Surgical History:  Procedure Laterality Date   APPENDECTOMY     BACK SURGERY     posterior lumbar spine fusion L4-5   COLONOSCOPY     COLONOSCOPY WITH PROPOFOL N/A 06/06/2017   Procedure: COLONOSCOPY WITH PROPOFOL;  Surgeon: Manya Silvas, MD;  Location: [facility] ENDOSCOPY;  Service: Endoscopy;  Laterality: N/A;   KNEE ARTHROSCOPY Bilateral    ACL   KNEE ARTHROSCOPY W/ ACL RECONSTRUCTION     RADIAL KERATOTOMY     ROTATOR CUFF REPAIR Bilateral    SHOULDER ARTHROSCOPY WITH BICEPS TENDON REPAIR Left 04/16/2019   Procedure: SHOULDER ARTHROSCOPY WITH BICEPS TENDON REPAIR, MINI OPEN SUPERIOR CAPSULAR RECONSTRUCTION, BICEPS TENODESIS;  Surgeon: Leim Fabry, MD;  Location: [facility] ORS;  Service: Orthopedics;  Laterality: Left;    There were no vitals filed for this visit.   Subjective Assessment - 01/19/21 1440     Subjective  "I've been practicing watching what I'm doing when I'm bringing my food up  to my mouth.  It helps a lot."    Patient is accompanied by: Family member    Pertinent History Parkinson's Disease, R sided weakness, falls    Limitations balance, strength, coordination, visual perception, problem solving, processing information    Patient Stated Goals "I want to get stronger.  It frustrates me when I have to ask for help with things like cutting my food or buttoning a button."    Currently in Pain? No/denies    Pain Score 0-No pain    Pain Onset More than a month ago            Occupational Therapy Treatment: Self Care: Participation in self feeding with use of light weight camping silverware.  Reviewed indications for light weight vs swivel utensil vs weighted utensils as pt reported that he feels like his tremors are worsening.  Instructed pt in modified positioning, including elbows on table top for increased distal stability and to minimize shoulder fatigue.  Pt able to self feed applesauce without spilling and good hand to mouth patterns without missing mouth.  Spouse reported, "This is a good day.  Usually he misses."  Pt able to eat cracker also with good hand to mouth accuracy.  Reviewed importance of minimizing  distractions and watching food come up to mouth.  When using straw, encouraged pt to hold tip of straw and use hand to mouth as a tactile cue to direct straw to mouth.    Neuro re-ed: Practiced spatial awareness with vision occluded, pt having to point to a target on his body; pt able to pinpoint targets within 1-3 inch accuracy.    Response to Treatment: See Plan/clinical impression below.    OT Education - 01/19/21 1446     Education Details self feeding strategies    Person(s) Educated Patient;Spouse    Methods Explanation;Verbal cues;Demonstration    Comprehension Verbalized understanding;Need further instruction;Verbal cues required;Returned demonstration              OT Short Term Goals - 12/31/20 0822       OT SHORT TERM GOAL #1    Title Pt will perform BUE HEP with min vc    Baseline Eval: not yet initiated; 11/19/2020: Pt able to perform with min vc; 12/22/20: indep with cane exercises, need to instruct further in theraband exercises    Time 6    Period Weeks    Status On-going    Target Date 02/01/21      OT [place] #2   Title Pt will report RPE of 4 or better to indicate improved tolerance to basic ADLs.    Baseline Eval: RPE of 6 with basic ADLs; 11/19/2020: RPE of 5 with basic ADLs; 12/22/20: RPE for ADLs fluctuates greatly from 4 on a good day, to 10 on a bad day; 12/31/20: RPE continues to fluctate greatly throughout the week 4-10    Time 6    Period Weeks    Status On-going    Target Date 02/01/21      OT SHORT TERM GOAL #3   Title Pt will utilize visual compensation strategies with min vc in order to improve basic and IADL performance.    Baseline Eval: pt reports some diplopia and decreased depth perception which limits ADL/IADL performance; Pt has been instructed in diplopia exercises and requires mod vc for compensation strategies when diplopia is present; 12/22/20: pt is indep with visual exercises and knowing when to allow rest breaks or use compensatory strategies, vs request caregiver assist for difficult tasks.    Time 6    Period Weeks    Status Achieved    Target Date 12/22/20               OT Long Term Goals - 12/31/20 0828       OT LONG TERM GOAL #1   Title Pt will increase bilat shoulder strength by 1MM grade in order to improve tolerance to UB ADLs/IADLs and reaching activities.    Baseline Eval: R/L shoulder flex/abd 4-/5, R/L shoulder ER 3+/5; stamina is improving but strength grades still same as eval; R/L shoulder flex/abd 4-, L ER 4-, R ER 3+, L/R IR 4 (pt reports now able to reach easier into overhead kitchen cabinets).    Time 12    Period Weeks    Status On-going    Target Date 03/15/21      OT LONG TERM GOAL #2   Title Pt will manage clothing fasteners and looping  belt with modified indep.    Baseline Eval: spouse assists.  Difficulty with tying shoes, looping belt, snapping and buttoning buttons; Using compensatory strategies for shoe tying as pt now has elastic shoe laces.  Pt now managing his own belt with slight extra  time.  Increased time to fasten buttons; 12/22/2020: Pt is consistent to loop belt using compensatory strategies, but struggles with buttoning shirts consistently and typically spouse will assist    Time 12    Period Weeks    Status On-going    Target Date 03/15/21      OT LONG TERM GOAL #3   Title Pt will cut food with modified indep.    Baseline Eval: spouse assists to cut food; Indep to cut food; 12/22/2020: Pt now struggles to cut meat; 12/31/20: Pt can cut chicken with min vc to use large/exaggerated sawing motions but spouse cuts steak.    Time 12    Period Weeks    Status On-going    Target Date 03/15/21      OT LONG TERM GOAL #4   Title Pt will brush hair with R dominant arm with modified indep.    Baseline Eval: Pt states he must hold R arm up with L hand, and often spouse must assist; 11/19/2020: Pt can reach to top and back of head with RUE but arm fatigues quickly with repetition required to brush hair.    Time 12    Period Weeks    Status Achieved    Target Date 12/22/20      OT LONG TERM GOAL #5   Title Pt will increase FOTO score to 62 or better to indicate measureable functional improvement with ADLs/IADLs.    Baseline Eval: FOTO score of 58; 12/22/2020: 57    Time 12    Period Weeks    Status On-going    Target Date 03/15/21      OT LONG TERM GOAL #6   Title Pt will improve time management skills utilizing external compensatory strategies such as calendars, schedules, alarms as needed with min A.    Baseline 12/22/2020: Not yet initiated (spouse reports that pt is taking longer to complete ADLs yet he is not allowing extra time to complete, causing a rush to get out the door).    Time 12    Period Weeks     Status On-going    Target Date 03/15/21      OT LONG TERM GOAL #7   Title Pt will improve dynamic standing balance to enable pt to feed dogs with modified indep.    Baseline 12/22/2020: Several falls over the last week; pt is inconsistent to use his rollator for dynamic standing tasks.  Would need supv to feed dogs.    Time 12    Period Weeks    Status On-going    Target Date 03/15/21              Plan - 01/19/21 1504     Clinical Impression Statement Good accuracy with self feeding this day using spoon for applesauce and hand to mouth patterns to eat a cracker; no spilling or missing mouth.  Spouse reports pt is having a "good day."  Pt receptive to compensatory ADL strategies and reports he's been carrying over strategies at home, including looking at his hand and remote when pressing a button on the tv remote, then switching his focus to the tv when directing the remote.  Pt reports he's paying more attention to his food on his silverware, watching it come to his mouth, which has reduced spilling and missing his mouth.  Pt will continue to benefit from skilled OT for instruction in ADL compensatory strategies in order to maximize indep, efficiency, and accuracy with ADLs.  OT Occupational Profile and History Detailed Assessment- Review of Records and additional review of physical, cognitive, psychosocial history related to current functional performance    Occupational performance deficits (Please refer to evaluation for details): ADL's;IADL's;Leisure    Body Structure / Function / Physical Skills ADL;Coordination;Endurance;GMC;UE functional use;Balance;Sensation;IADL;Vision;Dexterity;FMC;Strength;Continence;Gait;Mobility    Rehab Potential Good    Clinical Decision Making Several treatment options, min-mod task modification necessary    Comorbidities Affecting Occupational Performance: May have comorbidities impacting occupational performance    Modification or Assistance to Complete  Evaluation  Min-Moderate modification of tasks or assist with assess necessary to complete eval    OT Frequency 2x / week    OT Duration 12 weeks    OT Treatment/Interventions Self-care/ADL training;Therapeutic exercise;DME and/or AE instruction;Balance training;Neuromuscular education;Manual Therapy;Visual/perceptual remediation/compensation;Therapeutic activities;Patient/family education    Consulted and Agree with Plan of Care Patient              Patient will benefit from skilled therapeutic intervention in order to improve the following deficits and impairments:           Visit Diagnosis: Muscle weakness (generalized)  Other lack of coordination  Parkinson's disease [facility])    Problem List Patient Active Problem List   Diagnosis Date Noted   Focal seizure ([place]) 05/24/2020   Parkinson's disease ([place])    History of migraine headaches    Migraine syndrome    BPH (benign prostatic hyperplasia)    Depression    Degenerative spondylolisthesis 05/07/2016   Leta Speller, MS, OTR/L  Darleene Cleaver, OT/L 01/19/2021, 2:53 PM  [place] [address] [place], [place], [address] Phone: [phone]   Fax:  [phone]  Name: Cory [name] MRN: [id number] Date of Birth: 1956/01/09

## 2021-01-19 NOTE — Therapy (Signed)
[place] MAIN [facility] SERVICES [address] [place], [place], [address] Phone: [phone]   Fax:  [phone]  Physical Therapy Treatment     Patient Details  Name: Cory [name] MRN: [id number] Date of Birth: 1955/11/07 Referring Provider (PT): Benay Spice   Encounter Date: 01/19/2021   PT End of Session - 01/19/21 1121     Visit Number 31    Number of Visits 45    Date for PT Re-Evaluation 03/04/21    Authorization Type [employer] PPO    Authorization Time Period Cert 12/07/55-01/77/93    Progress Note Due on Visit 30    PT Start Time 1101    PT Stop Time 1145    PT Time Calculation (min) 44 min    Equipment Utilized During Treatment Gait belt    Activity Tolerance Patient tolerated treatment well    Behavior During Therapy WFL for tasks assessed/performed             Past Medical History:  Diagnosis Date   Arthritis    hips and back   Asthma    as a baby-    GERD (gastroesophageal reflux disease)    rare -    Parkinson's disease ([place])    Pneumonia    age 53   Rash    legs- on going    Past Surgical History:  Procedure Laterality Date   APPENDECTOMY     BACK SURGERY     posterior lumbar spine fusion L4-5   COLONOSCOPY     COLONOSCOPY WITH PROPOFOL N/A 06/06/2017   Procedure: COLONOSCOPY WITH PROPOFOL;  Surgeon: Manya Silvas, MD;  Location: [facility] ENDOSCOPY;  Service: Endoscopy;  Laterality: N/A;   KNEE ARTHROSCOPY Bilateral    ACL   KNEE ARTHROSCOPY W/ ACL RECONSTRUCTION     RADIAL KERATOTOMY     ROTATOR CUFF REPAIR Bilateral    SHOULDER ARTHROSCOPY WITH BICEPS TENDON REPAIR Left 04/16/2019   Procedure: SHOULDER ARTHROSCOPY WITH BICEPS TENDON REPAIR, MINI OPEN SUPERIOR CAPSULAR RECONSTRUCTION, BICEPS TENODESIS;  Surgeon: Leim Fabry, MD;  Location: [facility] ORS;  Service: Orthopedics;  Laterality: Left;    There were no vitals filed for this visit.   Subjective Assessment - 01/19/21 1106      Subjective Pt denies any changes since previous session. No falls or stumbles over weekend; was feeling a little unsteady and had some "stutter steps" this morning. Reports noticing he tilts when sitting more.    Patient is accompained by: Family member    Pertinent History Patient admitted to hospital 05/23/20 with code stroke and R sided weakness. Imaging negative for acute changes. PMH includes Parkinson's with autonomic instability, asthma, GERD depression, degenerative spondylolisthesis, focal seizures and migraine syndrome. Patient had three ED visits for falls since then. Patient reports when he has the seizures he has pain in his temples and bright flashes in his vision. Last week had three falls in one day.    Patient Stated Goals stability, have a device that will be most helpful, help to become and stay independent.           Neuro Re-Ed  Airex: Static standing x 1 minute, no UE support. Cuing for upright posture and  keeping heels down. More noticeable sway noted at 40sec mark.  Ambulating in hallway with close CGA and no AD: - 86 feet x2 cuing for hallway horizontal scanning, calling out letters loudly. - 86 ft x2 calling out numbers and symbols with horizontal  head turns, loudly. 1 episode of stutter stepping and near LOB noted. Verbal cuing required for resetting and bringing heels down. - 86 ft x2 Red Light/Green Light for sudden initiation and termination of movement . 2 episodes of stutter stepping noted during transition, cuing needed to reset. Patient rated tasks as easy.  Anterior/Posterior Weight shifting on rocker board with UE support. Slow and controlled. 2x30, switching forward leg.   Single leg RDLs with SUE support, reaching for cones (in front, to right, and to left of patient on floor). X8 reaches to each cone on each leg for balance and coordination.   Patient in center of circle of cones; Color called out by PT and pt required to scan for color then walk  around cone cuing for a pivot around cone then big stomp once back to center of circle. Frequent cuing required for resetting and taking big first step. Each color called out twice.   Modified Tai Chi: Modified mini squat with close CGA; arms held out to sides, lateral twist with 5 sec holds, x8 each side.  STS squat jumps x10 with close CGA    Patient with good effort throughout session. Patient demonstrated improved static standing balance and ambulating without AD, however frequent cuing needed throughout for resetting during episodes of shuffle stepping which increased during dual tasking or when patient was distracted. Patient will continue to benefit  from skilled PT to improve balance, strengthening, safety awareness, and independence with ADLs and ambulation.         PT Education - 01/19/21 1141     Education Details Gait technique, exercise technique, use of AD for safety.    Person(s) Educated Patient    Methods Explanation;Verbal cues;Demonstration    Comprehension Verbalized understanding;Returned demonstration;Verbal cues required              PT Short Term Goals - 10/21/20 0903       PT SHORT TERM GOAL #1   Title Patient will be independent in home exercise program to improve strength/mobility for better functional independence with ADLs.    Baseline 6/16: HEP to be given next session 7/19: HEP compliant    Time 4    Period Weeks    Status Achieved    Target Date 10/16/20               PT Long Term Goals - 01/16/21 1018       PT LONG TERM GOAL #1   Title Patient will increase FOTO score to equal to or greater than 51%    to demonstrate statistically significant improvement in mobility and quality of life.    Baseline 6/16: 48% 7/19: 57% 8/31: 50%    Time 12    Period Weeks    Status Achieved      PT LONG TERM GOAL #2   Title Patient will increase Berg Balance score by > 6 points (34/56)  to demonstrate decreased fall risk during functional  activities.    Baseline 6/16: 28/56 7/19: 45/56    Time 12    Period Weeks    Status Achieved      PT LONG TERM GOAL #3   Title Patient will deny any falls over past 4 weeks to demonstrate improved safety awareness at home and work.    Baseline 6/16: multiple falls 7/19: no falls reported 9/7: 3 falls in past month; 2 falls in past month    Time 12    Period Weeks    Status  On-going    Target Date 03/04/21      PT LONG TERM GOAL #4   Title Patient will increase six minute walk test distance to >1300 for progression to age norm community ambulator and improve gait ability    Baseline 6/16: 945 ft with multiple near falls 7/19: 1495 ft    Time 12    Period Weeks    Status Achieved      PT LONG TERM GOAL #5   Title Patient will increase Berg Balance score by > 6 points (51/56)  to demonstrate decreased fall risk during functional activities.    Baseline 7/19: 45/56 8/31: 49/56; 10/14: 52/56    Time 12    Period Weeks    Status Partially Met    Target Date 03/04/21      PT LONG TERM GOAL #6   Title Patient will increase Functional Gait Assessment score to >20/30 as to reduce fall risk and improve dynamic gait safety with community ambulation.    Baseline 7/19: 16/30 8/31: 20/30;    Time 12    Period Weeks    Status Achieved      PT LONG TERM GOAL #7   Title Patient will increase Functional Gait Assessment score to >26/30 as to reduce fall risk and improve dynamic gait safety with community ambulation.    Baseline 8/31: 20/30; 10/14: 22/30    Time 12    Period Weeks    Status On-going    Target Date 01/16/21      PT LONG TERM GOAL #8   Title Patient will static stand for approximately 5 minutes and perturbations for shooting/teaching classes without loss of balance or requiring object to lean on.    Baseline Unable to perform without leaning on objects; 10/14: Still unable to perform without leaning on objects.    Time 12    Period Weeks    Status On-going    Target Date  03/04/21      PT LONG TERM GOAL  #9   TITLE Patient will be able to ambulate approximately 1 mile walking dog across changing surfaces such as grass, concrete, and sidewalk without loss of balance.    Baseline 9/7: unable to perform yet; 10/14: Unable    Time 12    Period Weeks    Status On-going    Target Date 03/04/21      PT LONG TERM GOAL  #10   TITLE Patient will bend down and pick up items without losing balance to perform iADLs such as emptying dishwasher and feeding dog.    Baseline 9/7: unable to perform; 10/14: Performing at home without falls or LOB.    Time 12    Period Weeks    Status Achieved    Target Date 03/04/21                   Plan - 01/19/21 1222     Clinical Impression Statement Patient with good effort throughout session. Patient demonstrated improved static standing balance and ambulating without AD, however frequent cuing needed throughout for resetting during episodes of shuffle stepping which increased during dual tasking or when patient was distracted. Patient will continue to benefit  from skilled PT to improve balance, strengthening, safety awareness, and independence with ADLs and ambulation.    Personal Factors and Comorbidities Age;Comorbidity 3+;Fitness;Past/Current Experience;Time since onset of injury/illness/exacerbation;Transportation    Comorbidities Parkinson's with autonomic instability, asthma, GERD depression, degenerative spondylolisthesis, focal seizures and migraine syndrome  Examination-Activity Limitations Bathing;Bed Mobility;Bend;Caring for [employer];Locomotion Level;Lift;Hygiene/Grooming;Squat;Stairs;Stand;Toileting;Transfers    Examination-Participation Restrictions Church;Cleaning;Community Activity;Driving;Meal Prep;Medication Management;Laundry;Shop;Volunteer;Yard Work    Merchant navy officer Evolving/Moderate complexity    Rehab Potential Fair    PT Frequency 2x / week    PT  Duration 12 weeks    PT Treatment/Interventions ADLs/Self Care Home Management;Biofeedback;Canalith Repostioning;Cryotherapy;Parrafin;Ultrasound;Traction;Moist Heat;Iontophoresis 87m/ml Dexamethasone;Electrical Stimulation;DME Instruction;Gait training;Stair training;Functional mobility training;Neuromuscular re-education;Balance training;Therapeutic exercise;Therapeutic activities;Patient/family education;Orthotic Fit/Training;Manual techniques;Passive range of motion;Manual lymph drainage;Energy conservation;Splinting;Taping;Vasopneumatic Device;Vestibular;Joint Manipulations;Visual/perceptual remediation/compensation    PT Next Visit Plan Continue to advance motor control strength, activities that maximize repitition of inititation and cessation of movement. Progress dynamic balance and gait training for increased BOS and dual tasking.    PT Home Exercise Plan No updates today    Consulted and Agree with Plan of Care Patient    Family Member Consulted wife             Patient will benefit from skilled therapeutic intervention in order to improve the following deficits and impairments:  Abnormal gait, Decreased activity tolerance, Decreased balance, Decreased knowledge of precautions, Decreased endurance, Decreased coordination, Decreased cognition, Decreased knowledge of use of DME, Decreased mobility, Decreased safety awareness, Difficulty walking, Decreased strength, Impaired flexibility, Impaired perceived functional ability, Impaired sensation, Impaired tone, Impaired UE functional use, Impaired vision/preception, Improper body mechanics, Postural dysfunction  Visit Diagnosis: Unsteadiness on feet  Other abnormalities of gait and mobility  Muscle weakness (generalized)     Problem List Patient Active Problem List   Diagnosis Date Noted   Focal seizure ([place] 05/24/2020   [place]inson's disease ([place]    History of migraine headaches    Migraine syndrome    BPH (benign prostatic  hyperplasia)    Depression    Degenerative spondylolisthesis 05/07/2016   MArsenio Katz SPT This entire session was performed under direct supervision and direction of a licensed therapist/therapist assistant . I have personally read, edited and approve of the note as written.  MJanna Arch PT, DPT  01/19/2021, 12:25 PM  [place] [facility] [address] [address]Phone: [phone]  Fax:  [phone] Name: TTYNAN BOESELMRN: [id number] of Birth: [birth year]

## 2021-01-21 ENCOUNTER — Other Ambulatory Visit: Payer: Self-pay

## 2021-01-21 ENCOUNTER — Ambulatory Visit: Payer: Medicare PPO

## 2021-01-21 DIAGNOSIS — M6281 Muscle weakness (generalized): Secondary | ICD-10-CM

## 2021-01-21 DIAGNOSIS — G20 Parkinson's disease: Secondary | ICD-10-CM

## 2021-01-21 DIAGNOSIS — R278 Other lack of coordination: Secondary | ICD-10-CM

## 2021-01-21 DIAGNOSIS — R2681 Unsteadiness on feet: Secondary | ICD-10-CM

## 2021-01-21 DIAGNOSIS — R2689 Other abnormalities of gait and mobility: Secondary | ICD-10-CM

## 2021-01-21 NOTE — Therapy (Signed)
[place] MAIN [facility] SERVICES [address] [place], [place], [address] Phone: [phone]   Fax:  [phone]  Physical Therapy Treatment  Patient Details  Name: Cory [name] MRN: [id number] Date of Birth: Sep 02, 1955 Referring Provider (PT): Benay Spice   Encounter Date: 01/21/2021   PT End of Session - 01/21/21 1210     Visit Number 32    Number of Visits 45    Date for PT Re-Evaluation 03/04/21    Authorization Type [employer] PPO    Authorization Time Period Cert 04/10/15-61/60/73    Progress Note Due on Visit 30    PT Start Time 1101    PT Stop Time 1145    PT Time Calculation (min) 44 min    Equipment Utilized During Treatment Gait belt    Activity Tolerance Patient tolerated treatment well    Behavior During Therapy WFL for tasks assessed/performed             Past Medical History:  Diagnosis Date   Arthritis    hips and back   Asthma    as a baby-    GERD (gastroesophageal reflux disease)    rare -    Parkinson's disease ([place])    Pneumonia    age 65   Rash    legs- on going    Past Surgical History:  Procedure Laterality Date   APPENDECTOMY     BACK SURGERY     posterior lumbar spine fusion L4-5   COLONOSCOPY     COLONOSCOPY WITH PROPOFOL N/A 06/06/2017   Procedure: COLONOSCOPY WITH PROPOFOL;  Surgeon: Manya Silvas, MD;  Location: [facility] ENDOSCOPY;  Service: Endoscopy;  Laterality: N/A;   KNEE ARTHROSCOPY Bilateral    ACL   KNEE ARTHROSCOPY W/ ACL RECONSTRUCTION     RADIAL KERATOTOMY     ROTATOR CUFF REPAIR Bilateral    SHOULDER ARTHROSCOPY WITH BICEPS TENDON REPAIR Left 04/16/2019   Procedure: SHOULDER ARTHROSCOPY WITH BICEPS TENDON REPAIR, MINI OPEN SUPERIOR CAPSULAR RECONSTRUCTION, BICEPS TENODESIS;  Surgeon: Leim Fabry, MD;  Location: [facility] ORS;  Service: Orthopedics;  Laterality: Left;    There were no vitals filed for this visit.   Subjective Assessment - 01/21/21 1102      Subjective Patient denies having a difficult morning with balance and being able to walk after waking up. Reports waking up at 3AM with significant tremors. Still feeling a little "off" and needed significant help getting dressed this morning.    Patient is accompained by: Family member    Pertinent History Patient admitted to hospital 05/23/20 with code stroke and R sided weakness. Imaging negative for acute changes. PMH includes Parkinson's with autonomic instability, asthma, GERD depression, degenerative spondylolisthesis, focal seizures and migraine syndrome. Patient had three ED visits for falls since then. Patient reports when he has the seizures he has pain in his temples and bright flashes in his vision. Last week had three falls in one day.    Patient Stated Goals stability, have a device that will be most helpful, help to become and stay independent.    Currently in Pain? No/denies    Pain Score 0-No pain           Neuro Re-Ed:  Sitting on Dynadisc; Saebo ball on PVC pipe working on slow and controlled sliding ball from one end to the other x30 sec. Patient reported left shoulder pain so activity modified to spinning circles with Saebo ball and PVC pipe. 2x60 sec, cuing  for tall, upright posture.  Hedgehog swaps: colored hedgehogs set up in line. Patient instructed to swap out one color in the line for another color while walking forwards and backwards in parallel bars over hedgehogs. Encourages wide BOS while walking to not trip over obstacles, dual tasking while scanning for appropriate colors, higher order planning, safely bending over and picking up objects without LOB, and forwards/backwards walking. X 4mn.  STS slow and controlled 5 count eccentric and concentric, x8  Seated hedgehog taps with high knee marches; alternating legs with good upright posture away from chair x10 each side. Verbal and tactile cuing needed to maintain proper sequencing.   Seated on dynadisc marches with  4# ankle weights, 2x12 each side. Cuing for upright posture away from back of chair with no UE support.  Seated thoracic rotation; arms extended rotating to touch opposite shoe and returning to good upright center between each rep. x10 each side. Repeated touching alternate elbow to knee x10 each side.    Patient presents to PT reporting having an "off day" this morning. Patient tolerated session well giving great effort during new core strengthening exercises and dual tasking with increased cognitive load. Patient demonstrated improved self correction during freezing gait episodes compared to recent sessions. Patient will continue to benefit from skilled PT to address core strengthening, postural exercises, balance, and functional mobility to increase safety and independence with ADLs.    Pt educated throughout session about proper posture and technique with exercises. Improved exercise technique, movement at target joints, use of target muscles after min to mod verbal, visual, tactile cues.    PT Education - 01/21/21 1207     Education Details Exercise technique, cuing for pause and resetting during freezing gait episodes    Person(s) Educated Patient    Methods Explanation;Demonstration;Tactile cues;Verbal cues    Comprehension Verbalized understanding;Returned demonstration;Verbal cues required;Tactile cues required              PT Short Term Goals - 10/21/20 0903       PT SHORT TERM GOAL #1   Title Patient will be independent in home exercise program to improve strength/mobility for better functional independence with ADLs.    Baseline 6/16: HEP to be given next session 7/19: HEP compliant    Time 4    Period Weeks    Status Achieved    Target Date 10/16/20               PT Long Term Goals - 01/16/21 1018       PT LONG TERM GOAL #1   Title Patient will increase FOTO score to equal to or greater than 51%    to demonstrate statistically significant improvement in  mobility and quality of life.    Baseline 6/16: 48% 7/19: 57% 8/31: 50%    Time 12    Period Weeks    Status Achieved      PT LONG TERM GOAL #2   Title Patient will increase Berg Balance score by > 6 points (34/56)  to demonstrate decreased fall risk during functional activities.    Baseline 6/16: 28/56 7/19: 45/56    Time 12    Period Weeks    Status Achieved      PT LONG TERM GOAL #3   Title Patient will deny any falls over past 4 weeks to demonstrate improved safety awareness at home and work.    Baseline 6/16: multiple falls 7/19: no falls reported 9/7: 3 falls in past month; 2  falls in past month    Time 12    Period Weeks    Status On-going    Target Date 03/04/21      PT LONG TERM GOAL #4   Title Patient will increase six minute walk test distance to >1300 for progression to age norm community ambulator and improve gait ability    Baseline 6/16: 945 ft with multiple near falls 7/19: 1495 ft    Time 12    Period Weeks    Status Achieved      PT LONG TERM GOAL #5   Title Patient will increase Berg Balance score by > 6 points (51/56)  to demonstrate decreased fall risk during functional activities.    Baseline 7/19: 45/56 8/31: 49/56; 10/14: 52/56    Time 12    Period Weeks    Status Partially Met    Target Date 03/04/21      PT LONG TERM GOAL #6   Title Patient will increase Functional Gait Assessment score to >20/30 as to reduce fall risk and improve dynamic gait safety with community ambulation.    Baseline 7/19: 16/30 8/31: 20/30;    Time 12    Period Weeks    Status Achieved      PT LONG TERM GOAL #7   Title Patient will increase Functional Gait Assessment score to >26/30 as to reduce fall risk and improve dynamic gait safety with community ambulation.    Baseline 8/31: 20/30; 10/14: 22/30    Time 12    Period Weeks    Status On-going    Target Date 01/16/21      PT LONG TERM GOAL #8   Title Patient will static stand for approximately 5 minutes and  perturbations for shooting/teaching classes without loss of balance or requiring object to lean on.    Baseline Unable to perform without leaning on objects; 10/14: Still unable to perform without leaning on objects.    Time 12    Period Weeks    Status On-going    Target Date 03/04/21      PT LONG TERM GOAL  #9   TITLE Patient will be able to ambulate approximately 1 mile walking dog across changing surfaces such as grass, concrete, and sidewalk without loss of balance.    Baseline 9/7: unable to perform yet; 10/14: Unable    Time 12    Period Weeks    Status On-going    Target Date 03/04/21      PT LONG TERM GOAL  #10   TITLE Patient will bend down and pick up items without losing balance to perform iADLs such as emptying dishwasher and feeding dog.    Baseline 9/7: unable to perform; 10/14: Performing at home without falls or LOB.    Time 12    Period Weeks    Status Achieved    Target Date 03/04/21                   Plan - 01/21/21 1211     Personal Factors and Comorbidities Age;Comorbidity 3+;Fitness;Past/Current Experience;Time since onset of injury/illness/exacerbation;Transportation    Comorbidities Parkinson's with autonomic instability, asthma, GERD depression, degenerative spondylolisthesis, focal seizures and migraine syndrome    Examination-Activity Limitations Bathing;Bed Mobility;Bend;Caring for [employer];Locomotion Level;Lift;Hygiene/Grooming;Squat;Stairs;Stand;Toileting;Transfers    Examination-Participation Restrictions Church;Cleaning;Community Activity;Driving;Meal Prep;Medication Management;Laundry;Shop;Volunteer;Yard Work    Product manager    Rehab Potential Fair    PT Frequency 2x / week    PT Duration 12 weeks  PT Treatment/Interventions ADLs/Self Care Home Management;Biofeedback;Canalith Repostioning;Cryotherapy;Parrafin;Ultrasound;Traction;Moist Heat;Iontophoresis 42m/ml  Dexamethasone;Electrical Stimulation;DME Instruction;Gait training;Stair training;Functional mobility training;Neuromuscular re-education;Balance training;Therapeutic exercise;Therapeutic activities;Patient/family education;Orthotic Fit/Training;Manual techniques;Passive range of motion;Manual lymph drainage;Energy conservation;Splinting;Taping;Vasopneumatic Device;Vestibular;Joint Manipulations;Visual/perceptual remediation/compensation    PT Next Visit Plan Increase cognitive demand/dual tasking during gait, progress thoracic opening and rotaiton exercises, core strengthening, progress dynamic balance and gait training for increased BOS and dual tasking.    PT Home Exercise Plan No updates today    Consulted and Agree with Plan of Care Patient    Family Member Consulted wife             Patient will benefit from skilled therapeutic intervention in order to improve the following deficits and impairments:  Abnormal gait, Decreased activity tolerance, Decreased balance, Decreased knowledge of precautions, Decreased endurance, Decreased coordination, Decreased cognition, Decreased knowledge of use of DME, Decreased mobility, Decreased safety awareness, Difficulty walking, Decreased strength, Impaired flexibility, Impaired perceived functional ability, Impaired sensation, Impaired tone, Impaired UE functional use, Impaired vision/preception, Improper body mechanics, Postural dysfunction  Visit Diagnosis: Unsteadiness on feet  Other abnormalities of gait and mobility  Muscle weakness (generalized)     Problem List Patient Active Problem List   Diagnosis Date Noted   Focal seizure ([place] 05/24/2020   Parkinson's disease ([place]    History of migraine headaches    Migraine syndrome    BPH (benign prostatic hyperplasia)    Depression    Degenerative spondylolisthesis 05/07/2016   MArsenio Katz SPT This entire session was performed under direct supervision and direction of a licensed  therapist/therapist assistant . I have personally read, edited and approve of the note as written.   MJanna Arch PT, DPT  01/21/2021, 1:07 PM  CNorth River ShoresMAIN RK[facility] LL[facility]ERVICES [address] [place] [address]Phone: [phone]  Fax:  [phone] Name: Cory SAMEKMRN: [id number] of Birth: 708/14/1957

## 2021-01-22 NOTE — Therapy (Signed)
[place] MAIN [facility] SERVICES [address] [place], [place], [address] Phone: [phone]   Fax:  [phone]  Occupational Therapy Treatment  Patient Details  Name: Cory [name] MRN: [id number] Date of Birth: 1956-03-10 No data recorded  Encounter Date: 01/21/2021   OT End of Session - 01/22/21 0827     Visit Number 26    Number of Visits 73    Date for OT Re-Evaluation 03/15/21    Authorization Time Period Reporting period starting 01/05/2021    OT Start Time 1145    OT Stop Time 1235    OT Time Calculation (min) 50 min    Equipment Utilized During Treatment 4 wheeled walker    Activity Tolerance Patient tolerated treatment well    Behavior During Therapy WFL for tasks assessed/performed             Past Medical History:  Diagnosis Date   Arthritis    hips and back   Asthma    as a baby-    GERD (gastroesophageal reflux disease)    rare -    Parkinson's disease ([place])    Pneumonia    age 65   Rash    legs- on going    Past Surgical History:  Procedure Laterality Date   APPENDECTOMY     BACK SURGERY     posterior lumbar spine fusion L4-5   COLONOSCOPY     COLONOSCOPY WITH PROPOFOL N/A 06/06/2017   Procedure: COLONOSCOPY WITH PROPOFOL;  Surgeon: Manya Silvas, MD;  Location: [facility] ENDOSCOPY;  Service: Endoscopy;  Laterality: N/A;   KNEE ARTHROSCOPY Bilateral    ACL   KNEE ARTHROSCOPY W/ ACL RECONSTRUCTION     RADIAL KERATOTOMY     ROTATOR CUFF REPAIR Bilateral    SHOULDER ARTHROSCOPY WITH BICEPS TENDON REPAIR Left 04/16/2019   Procedure: SHOULDER ARTHROSCOPY WITH BICEPS TENDON REPAIR, MINI OPEN SUPERIOR CAPSULAR RECONSTRUCTION, BICEPS TENODESIS;  Surgeon: Leim Fabry, MD;  Location: [facility] ORS;  Service: Orthopedics;  Laterality: Left;    There were no vitals filed for this visit.   Subjective Assessment - 01/21/21 0825     Subjective  "His cognitive skills have really been declining." (per spouse)    Patient  is accompanied by: Family member    Pertinent History Parkinson's Disease, R sided weakness, falls    Limitations balance, strength, coordination, visual perception, problem solving, processing information    Patient Stated Goals "I want to get stronger.  It frustrates me when I have to ask for help with things like cutting my food or buttoning a button."    Currently in Pain? No/denies    Pain Score 0-No pain    Pain Onset More than a month ago           Occupational Therapy Treatment: Therapeutic Exercise: Utilized various hand tools to loosen and tighten nuts/bolts/screws, targeting FM skills for self care tasks.  Pt was able to identify necessary tool for each task, required extra time to line tool up in the groove of the screws, intermittent min A to assist with alignment.   Self Care: Participated in cognitive tasks with 4 card ADL sequencing cards; able to correctly and independently sequence 4 of 5 sets of cards, required 1 vc for 1 of 5 sets.  Addressed safety awareness with home safety awareness cards with pt able to identify safety hazards in 3 scenarios.    Response to Treatment: Spouse reported pt took 20 min to fasten his  seat belt this morning and refused help from spouse.  Continued need to address time management and indications for caregiver assist vs allowing extra time to attempt tasks independently.  Spouse and pt both confirm a noted decline in pt's cognition and are interested in an SLP referral, as well as continued incorporation of cognitive tasks during OT visits.  OT to continue to address compensatory strategies for ADLs/safety/cognition, HEP progression in order to maximize indep and safety in the home, and management of chronic/progressive disease as it relates to self care.     OT Education - 01/21/21 0826     Education Details cognitive tasks to incorporate into HEP    Person(s) Educated Patient;Spouse    Methods Explanation;Verbal cues;Demonstration     Comprehension Verbalized understanding;Need further instruction;Verbal cues required;Returned demonstration              OT Short Term Goals - 12/31/20 0822       OT SHORT TERM GOAL #1   Title Pt will perform BUE HEP with min vc    Baseline Eval: not yet initiated; 11/19/2020: Pt able to perform with min vc; 12/22/20: indep with cane exercises, need to instruct further in theraband exercises    Time 6    Period Weeks    Status On-going    Target Date 02/01/21      OT SHORT TERM GOAL #2   Title Pt will report RPE of 4 or better to indicate improved tolerance to basic ADLs.    Baseline Eval: RPE of 6 with basic ADLs; 11/19/2020: RPE of 5 with basic ADLs; 12/22/20: RPE for ADLs fluctuates greatly from 4 on a good day, to 10 on a bad day; 12/31/20: RPE continues to fluctate greatly throughout the week 4-10    Time 6    Period Weeks    Status On-going    Target Date 02/01/21      OT SHORT TERM GOAL #3   Title Pt will utilize visual compensation strategies with min vc in order to improve basic and IADL performance.    Baseline Eval: pt reports some diplopia and decreased depth perception which limits ADL/IADL performance; Pt has been instructed in diplopia exercises and requires mod vc for compensation strategies when diplopia is present; 12/22/20: pt is indep with visual exercises and knowing when to allow rest breaks or use compensatory strategies, vs request caregiver assist for difficult tasks.    Time 6    Period Weeks    Status Achieved    Target Date 12/22/20               OT Long Term Goals - 12/31/20 0828       OT LONG TERM GOAL #1   Title Pt will increase bilat shoulder strength by 1MM grade in order to improve tolerance to UB ADLs/IADLs and reaching activities.    Baseline Eval: R/L shoulder flex/abd 4-/5, R/L shoulder ER 3+/5; stamina is improving but strength grades still same as eval; R/L shoulder flex/abd 4-, L ER 4-, R ER 3+, L/R IR 4 (pt reports now able to reach  easier into overhead kitchen cabinets).    Time 12    Period Weeks    Status On-going    Target Date 03/15/21      OT LONG TERM GOAL #2   Title Pt will manage clothing fasteners and looping belt with modified indep.    Baseline Eval: spouse assists.  Difficulty with tying shoes, looping belt, snapping and buttoning buttons;  Using compensatory strategies for shoe tying as pt now has elastic shoe laces.  Pt now managing his own belt with slight extra time.  Increased time to fasten buttons; 12/22/2020: Pt is consistent to loop belt using compensatory strategies, but struggles with buttoning shirts consistently and typically spouse will assist    Time 12    Period Weeks    Status On-going    Target Date 03/15/21      OT LONG TERM GOAL #3   Title Pt will cut food with modified indep.    Baseline Eval: spouse assists to cut food; Indep to cut food; 12/22/2020: Pt now struggles to cut meat; 12/31/20: Pt can cut chicken with min vc to use large/exaggerated sawing motions but spouse cuts steak.    Time 12    Period Weeks    Status On-going    Target Date 03/15/21      OT LONG TERM GOAL #4   Title Pt will brush hair with R dominant arm with modified indep.    Baseline Eval: Pt states he must hold R arm up with L hand, and often spouse must assist; 11/19/2020: Pt can reach to top and back of head with RUE but arm fatigues quickly with repetition required to brush hair.    Time 12    Period Weeks    Status Achieved    Target Date 12/22/20      OT LONG TERM GOAL #5   Title Pt will increase FOTO score to 62 or better to indicate measureable functional improvement with ADLs/IADLs.    Baseline Eval: FOTO score of 58; 12/22/2020: 57    Time 12    Period Weeks    Status On-going    Target Date 03/15/21      OT LONG TERM GOAL #6   Title Pt will improve time management skills utilizing external compensatory strategies such as calendars, schedules, alarms as needed with min A.    Baseline 12/22/2020:  Not yet initiated (spouse reports that pt is taking longer to complete ADLs yet he is not allowing extra time to complete, causing a rush to get out the door).    Time 12    Period Weeks    Status On-going    Target Date 03/15/21      OT LONG TERM GOAL #7   Title Pt will improve dynamic standing balance to enable pt to feed dogs with modified indep.    Baseline 12/22/2020: Several falls over the last week; pt is inconsistent to use his rollator for dynamic standing tasks.  Would need supv to feed dogs.    Time 12    Period Weeks    Status On-going    Target Date 03/15/21                   Plan - 01/21/21 0846     Clinical Impression Statement Spouse reported pt took 20 min to fasten his seat belt this morning and refused help from spouse.  Continued need to address time management and indications for caregiver assist vs allowing extra time to attempt tasks independently.  Spouse and pt both confirm a noted decline in pt's cognition and are interested in an SLP referral, as well as continued incorporation of cognitive tasks during OT visits.  OT to continue to address compensatory strategies for ADLs/safety/cognition, HEP progression in order to maximize indep and safety in the home, and management of chronic/progressive disease as it relates to self care.  OT Occupational Profile and History Detailed Assessment- Review of Records and additional review of physical, cognitive, psychosocial history related to current functional performance    Occupational performance deficits (Please refer to evaluation for details): ADL's;IADL's;Leisure    Body Structure / Function / Physical Skills ADL;Coordination;Endurance;GMC;UE functional use;Balance;Sensation;IADL;Vision;Dexterity;FMC;Strength;Continence;Gait;Mobility    Rehab Potential Good    Clinical Decision Making Several treatment options, min-mod task modification necessary    Comorbidities Affecting Occupational Performance: May have  comorbidities impacting occupational performance    Modification or Assistance to Complete Evaluation  Min-Moderate modification of tasks or assist with assess necessary to complete eval    OT Frequency 2x / week    OT Duration 12 weeks    OT Treatment/Interventions Self-care/ADL training;Therapeutic exercise;DME and/or AE instruction;Balance training;Neuromuscular education;Manual Therapy;Visual/perceptual remediation/compensation;Therapeutic activities;Patient/family education    Consulted and Agree with Plan of Care Patient             Patient will benefit from skilled therapeutic intervention in order to improve the following deficits and impairments:   Body Structure / Function / Physical Skills: ADL, Coordination, Endurance, GMC, UE functional use, Balance, Sensation, IADL, Vision, Dexterity, FMC, Strength, Continence, Gait, Mobility       Visit Diagnosis: Muscle weakness (generalized)  Other lack of coordination  Parkinson's disease [facility])    Problem List Patient Active Problem List   Diagnosis Date Noted   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

## 2021-01-26 ENCOUNTER — Ambulatory Visit: Payer: Medicare PPO

## 2021-01-26 ENCOUNTER — Other Ambulatory Visit: Payer: Self-pay

## 2021-01-26 DIAGNOSIS — M6281 Muscle weakness (generalized): Secondary | ICD-10-CM

## 2021-01-26 DIAGNOSIS — R2681 Unsteadiness on feet: Secondary | ICD-10-CM

## 2021-01-26 DIAGNOSIS — R278 Other lack of coordination: Secondary | ICD-10-CM

## 2021-01-26 DIAGNOSIS — G20 Parkinson's disease: Secondary | ICD-10-CM | POA: Diagnosis not present

## 2021-01-26 DIAGNOSIS — R2689 Other abnormalities of gait and mobility: Secondary | ICD-10-CM

## 2021-01-26 NOTE — Therapy (Signed)
[employer] MAIN [facility] SERVICES [address] [place], [place], [address] Phone: [phone]   Fax:  [phone]  Physical Therapy Treatment  Patient Details  Name: Cory [name] MRN: [id number] Date of Birth: [birth year] Referring Provider (PT): Benay Spice   Encounter Date: 01/26/2021   PT End of Session - 01/26/21 1054     Visit Number 33    Number of Visits 45    Date for PT Re-Evaluation 03/04/21    Authorization Type [employer] P[employer]    Authorization Time Period Cert 05/14/77-89/21/19    Progress Note Due on Visit 108    PT Start Time 1059    PT Stop Time 1144    PT Time Calculation (min) 45 min    Equipment Utilized During Treatment Gait belt    Activity Tolerance Patient tolerated treatment well    Behavior During Therapy WFL for tasks assessed/performed             Past Medical History:  Diagnosis Date   Arthritis    hips and back   Asthma    as a baby-    GERD (gastroesophageal reflux disease)    rare -    Parkinson's disease ([place])    Pneumonia    age 47   Rash    legs- on going    Past Surgical History:  Procedure Laterality Date   APPENDECTOMY     BACK SURGERY     posterior lumbar spine fusion L4-5   COLONOSCOPY     COLONOSCOPY WITH PROPOFOL N/A 06/06/2017   Procedure: COLONOSCOPY WITH PROPOFOL;  Surgeon: Manya Silvas, MD;  Location: [facility] ENDOSCOPY;  Service: Endoscopy;  Laterality: N/A;   KNEE ARTHROSCOPY Bilateral    ACL   KNEE ARTHROSCOPY W/ ACL RECONSTRUCTION     RADIAL KERATOTOMY     ROTATOR CUFF REPAIR Bilateral    SHOULDER ARTHROSCOPY WITH BICEPS TENDON REPAIR Left 04/16/2019   Procedure: SHOULDER ARTHROSCOPY WITH BICEPS TENDON REPAIR, MINI OPEN SUPERIOR CAPSULAR RECONSTRUCTION, BICEPS TENODESIS;  Surgeon: Leim Fabry, MD;  Location: [facility] ORS;  Service: Orthopedics;  Laterality: Left;    There were no vitals filed for this visit.   Subjective Assessment - 01/26/21 1102      Subjective Patient reports today is a good day. Patient reports no falls over the weekend.    Patient is accompained by: Family member    Pertinent History Patient admitted to hospital 05/23/20 with code stroke and R sided weakness. Imaging negative for acute changes. PMH includes Parkinson's with autonomic instability, asthma, GERD depression, degenerative spondylolisthesis, focal seizures and migraine syndrome. Patient had three ED visits for falls since then. Patient reports when he has the seizures he has pain in his temples and bright flashes in his vision. Last week had three falls in one day.    Patient Stated Goals stability, have a device that will be most helpful, help to become and stay independent.    Currently in Pain? No/denies                 Neuro Re-Ed   Hedgehog swaps: colored hedgehogs set up in line. Patient instructed to swap out one color in the line for another color while walking forwards and backwards in parallel bars over hedgehogs. Encourages wide BOS while walking to not trip over obstacles, dual tasking while scanning for appropriate colors, higher order planning, safely bending over and picking up objects without LOB, and forwards/backwards walking. Patient challenged with  higher demand cognition tasks X 13 min    Stand on airex balance beam, side step and reach for sticky notes to spell words based on SPT guidance. Lateral step to reach and grab letters on sticky note and then bring to center to spell it out. Challenging to both fine motor skills and dual cognition: First and second cues for spelling animals. Then asked a four letter word, five letter word, etc. X 16 minutes   Bosu ball: flat side up: static stand 60 seconds ; x 2 trials   Bosu ball: round side up: modified forward lunge 10x each side, no UE support ; intermittent UE support required due to LOB, cues for upright posture    Lateral step chest press basketball with cue for wide step 10x length  of //b ars with CGA.     Pt educated throughout session about proper posture and technique with exercises. Improved exercise technique, movement at target joints, use of target muscles after min to mod verbal, visual, tactile cues.   Patient presents to physical therapy with excellent motivation. He is in high spirits and eager to participate in therapy session. Dual task and cognition based tasks remain challenging for patient and will continue to be an area of improvement. The higher level demand of a task the more challenge to a patient requiring higher level of cueing. Patient shuffles more with posterior ambulation.  Patient will continue to benefit from skilled PT to address core strengthening, postural exercises, balance, and functional mobility to increase safety and independence with ADLs.                  PT Education - 01/26/21 1054     Education Details exercise technique, body mechanics    Person(s) Educated Patient    Methods Explanation;Tactile cues;Demonstration;Verbal cues    Comprehension Verbalized understanding;Returned demonstration;Verbal cues required;Tactile cues required              PT Short Term Goals - 10/21/20 0903       PT SHORT TERM GOAL #1   Title Patient will be independent in home exercise program to improve strength/mobility for better functional independence with ADLs.    Baseline 6/16: HEP to be given next session 7/19: HEP compliant    Time 4    Period Weeks    Status Achieved    Target Date 10/16/20               PT Long Term Goals - 01/16/21 1018       PT LONG TERM GOAL #1   Title Patient will increase FOTO score to equal to or greater than 51%    to demonstrate statistically significant improvement in mobility and quality of life.    Baseline 6/16: 48% 7/19: 57% 8/31: 50%    Time 12    Period Weeks    Status Achieved      PT LONG TERM GOAL #2   Title Patient will increase Berg Balance score by > 6 points  (34/56)  to demonstrate decreased fall risk during functional activities.    Baseline 6/16: 28/56 7/19: 45/56    Time 12    Period Weeks    Status Achieved      PT LONG TERM GOAL #3   Title Patient will deny any falls over past 4 weeks to demonstrate improved safety awareness at home and work.    Baseline 6/16: multiple falls 7/19: no falls reported 9/7: 3 falls in past month; 2  falls in past month    Time 12    Period Weeks    Status On-going    Target Date 03/04/21      PT LONG TERM GOAL #4   Title Patient will increase six minute walk test distance to >1300 for progression to age norm community ambulator and improve gait ability    Baseline 6/16: 945 ft with multiple near falls 7/19: 1495 ft    Time 12    Period Weeks    Status Achieved      PT LONG TERM GOAL #5   Title Patient will increase Berg Balance score by > 6 points (51/56)  to demonstrate decreased fall risk during functional activities.    Baseline 7/19: 45/56 8/31: 49/56; 10/14: 52/56    Time 12    Period Weeks    Status Partially Met    Target Date 03/04/21      PT LONG TERM GOAL #6   Title Patient will increase Functional Gait Assessment score to >20/30 as to reduce fall risk and improve dynamic gait safety with community ambulation.    Baseline 7/19: 16/30 8/31: 20/30;    Time 12    Period Weeks    Status Achieved      PT LONG TERM GOAL #7   Title Patient will increase Functional Gait Assessment score to >26/30 as to reduce fall risk and improve dynamic gait safety with community ambulation.    Baseline 8/31: 20/30; 10/14: 22/30    Time 12    Period Weeks    Status On-going    Target Date 01/16/21      PT LONG TERM GOAL #8   Title Patient will static stand for approximately 5 minutes and perturbations for shooting/teaching classes without loss of balance or requiring object to lean on.    Baseline Unable to perform without leaning on objects; 10/14: Still unable to perform without leaning on objects.     Time 12    Period Weeks    Status On-going    Target Date 03/04/21      PT LONG TERM GOAL  #9   TITLE Patient will be able to ambulate approximately 1 mile walking dog across changing surfaces such as grass, concrete, and sidewalk without loss of balance.    Baseline 9/7: unable to perform yet; 10/14: Unable    Time 12    Period Weeks    Status On-going    Target Date 03/04/21      PT LONG TERM GOAL  #10   TITLE Patient will bend down and pick up items without losing balance to perform iADLs such as emptying dishwasher and feeding dog.    Baseline 9/7: unable to perform; 10/14: Performing at home without falls or LOB.    Time 12    Period Weeks    Status Achieved    Target Date 03/04/21                   Plan - 01/26/21 1108     Clinical Impression Statement Patient presents to physical therapy with excellent motivation. He is in high spirits and eager to participate in therapy session. Dual task and cognition based tasks remain challenging for patient and will continue to be an area of improvement. The higher level demand of a task the more challenge to a patient requiring higher level of cueing. Patient shuffles more with posterior ambulation.  Patient will continue to benefit from skilled PT to address core strengthening,  postural exercises, balance, and functional mobility to increase safety and independence with ADLs.    Personal Factors and Comorbidities Age;Comorbidity 3+;Fitness;Past/Current Experience;Time since onset of injury/illness/exacerbation;Transportation    Comorbidities Parkinson's with autonomic instability, asthma, GERD depression, degenerative spondylolisthesis, focal seizures and migraine syndrome    Examination-Activity Limitations Bathing;Bed Mobility;Bend;Caring for [employer];Locomotion Level;Lift;Hygiene/Grooming;Squat;Stairs;Stand;Toileting;Transfers    Examination-Participation Restrictions Church;Cleaning;Community  Activity;Driving;Meal Prep;Medication Management;Laundry;Shop;Volunteer;Yard Work    [employer]chant [employer] officer Evolving/Moderate complexity    Rehab Potential Fair    PT Frequency 2x / week    PT Duration 12 weeks    PT Treatment/Interventions ADLs/Self Care Home Management;Biofeedback;Canalith Repostioning;Cryotherapy;Parrafin;Ultrasound;Traction;Moist Heat;Iontophoresis 26m/ml Dexamethasone;Electrical Stimulation;DME Instruction;Gait training;Stair training;Functional mobility training;Neuromuscular re-education;Balance training;Therapeutic exercise;Therapeutic activities;Patient/family education;Orthotic Fit/Training;Manual techniques;Passive range of motion;Manual lymph drainage;Energy conservation;Splinting;Taping;Vasopneumatic Device;Vestibular;Joint Manipulations;Visual/perceptual remediation/compensation    PT Next Visit Plan Increase cognitive demand/dual tasking during gait, progress thoracic opening and rotaiton exercises, core strengthening, progress dynamic balance and gait training for increased BOS and dual tasking.    PT Home Exercise Plan No updates today    Consulted and Agree with Plan of Care Patient    Family Member Consulted wife             Patient will benefit from skilled therapeutic intervention in order to improve the following deficits and impairments:  Abnormal gait, Decreased activity tolerance, Decreased balance, Decreased knowledge of precautions, Decreased endurance, Decreased coordination, Decreased cognition, Decreased knowledge of use of DME, Decreased mobility, Decreased safety awareness, Difficulty walking, Decreased strength, Impaired flexibility, Impaired perceived functional ability, Impaired sensation, Impaired tone, Impaired UE functional use, Impaired vision/preception, Improper body mechanics, Postural dysfunction  Visit Diagnosis: Muscle weakness (generalized)  Unsteadiness on feet  Other abnormalities of gait and  mobility     Problem List Patient Active Problem List   Diagnosis Date Noted   [place]ocal [place] 05/24/2020   Parkinson's disease ([place]    History of migraine headaches    Migraine syndrome    BPH (benign prostatic hyperplasia)    Depression    Degenerative spondylolisthesis 05/07/2016   MJanna Arch PT, DPT  01/26/2021, 12:02 PM  [facility] [address] [address]Phone: [phone]  Fax:  [phone] Name: Cory HALLQUISTMRN: [id number] of Birth: [birth year]

## 2021-01-26 NOTE — Therapy (Signed)
[place] MAIN [facility] SERVICES [address] [place], [place], [address] Phone: [phone]   Fax:  [phone]  Occupational Therapy Treatment  Patient Details  Name: ONIS MARKOFF MRN: [id number] Date of Birth: May 25, 1955 No data recorded  Encounter Date: 01/26/2021   OT End of Session - 01/26/21 1635     Visit Number 27    Number of Visits 25    Date for OT Re-Evaluation 03/15/21    Authorization Time Period Reporting period starting 01/05/2021    OT Start Time 1145    OT Stop Time 74    OT Time Calculation (min) 45 min    Equipment Utilized During Treatment 4 wheeled walker    Activity Tolerance Patient tolerated treatment well    Behavior During Therapy WFL for tasks assessed/performed             Past Medical History:  Diagnosis Date   Arthritis    hips and back   Asthma    as a baby-    GERD (gastroesophageal reflux disease)    rare -    Parkinson's disease ([place])    Pneumonia    age 43   Rash    legs- on going    Past Surgical History:  Procedure Laterality Date   APPENDECTOMY     BACK SURGERY     posterior lumbar spine fusion L4-5   COLONOSCOPY     COLONOSCOPY WITH PROPOFOL N/A 06/06/2017   Procedure: COLONOSCOPY WITH PROPOFOL;  Surgeon: Manya Silvas, MD;  Location: [facility] ENDOSCOPY;  Service: Endoscopy;  Laterality: N/A;   KNEE ARTHROSCOPY Bilateral    ACL   KNEE ARTHROSCOPY W/ ACL RECONSTRUCTION     RADIAL KERATOTOMY     ROTATOR CUFF REPAIR Bilateral    SHOULDER ARTHROSCOPY WITH BICEPS TENDON REPAIR Left 04/16/2019   Procedure: SHOULDER ARTHROSCOPY WITH BICEPS TENDON REPAIR, MINI OPEN SUPERIOR CAPSULAR RECONSTRUCTION, BICEPS TENODESIS;  Surgeon: Leim Fabry, MD;  Location: [facility] ORS;  Service: Orthopedics;  Laterality: Left;    There were no vitals filed for this visit.   Subjective Assessment - 01/26/21 1633     Subjective  "I bought him a couple of jigsaw puzzles." (per spouse)    Patient is  accompanied by: Family member    Pertinent History Parkinson's Disease, R sided weakness, falls    Limitations balance, strength, coordination, visual perception, problem solving, processing information    Patient Stated Goals "I want to get stronger.  It frustrates me when I have to ask for help with things like cutting my food or buttoning a button."    Currently in Pain? No/denies    Pain Score 0-No pain    Pain Onset More than a month ago            Occupational Therapy Treatment: Therapeutic Exercise: Provided activity to increase bilat grip strength for improving manipulation of ADL supplies.  Pt used hand gripper set at 17.9 lbs to remove jumbo pegs from pegboard 2-3 trials each hand.  Pt required intermittent min verbal cue and demo on "resetting" during a freezing spell with either arm.  Pt was cued to stop, rest, and try again with an exaggerated movement for the same movement pattern requirements for this activity.  "Resetting" was effective each time, and pt demonstrated good carryover with minimal cueing.  OT performed passive stretching to bilat shoulders, all planes, working to reduce stiffness and improve range for overhead reaching and UB ADLs.  Instructed in  wall stretch for erect posture, providing tactile cues at shoulders, chest, and forehead to touch back of head against the wall with chin tucked.  Instructed pt to tighten abdomen to maintain erect posture and hold as long as able.  Pt able to return demo and spouse also present to help with carry over at home.   Response to Treatment: See Plan/clinical impression below.    OT Education - 01/26/21 1635     Education Details postural stretches/shoulder stretches    Person(s) Educated Patient;Spouse    Methods Explanation;Verbal cues;Demonstration    Comprehension Verbalized understanding;Need further instruction;Verbal cues required;Returned demonstration              OT Short Term Goals - 12/31/20 0822        OT SHORT TERM GOAL #1   Title Pt will perform BUE HEP with min vc    Baseline Eval: not yet initiated; 11/19/2020: Pt able to perform with min vc; 12/22/20: indep with cane exercises, need to instruct further in theraband exercises    Time 6    Period Weeks    Status On-going    Target Date 02/01/21      OT [place] #2   Title Pt will report RPE of 4 or better to indicate improved tolerance to basic ADLs.    Baseline Eval: RPE of 6 with basic ADLs; 11/19/2020: RPE of 5 with basic ADLs; 12/22/20: RPE for ADLs fluctuates greatly from 4 on a good day, to 10 on a bad day; 12/31/20: RPE continues to fluctate greatly throughout the week 4-10    Time 6    Period Weeks    Status On-going    Target Date 02/01/21      OT SHORT TERM GOAL #3   Title Pt will utilize visual compensation strategies with min vc in order to improve basic and IADL performance.    Baseline Eval: pt reports some diplopia and decreased depth perception which limits ADL/IADL performance; Pt has been instructed in diplopia exercises and requires mod vc for compensation strategies when diplopia is present; 12/22/20: pt is indep with visual exercises and knowing when to allow rest breaks or use compensatory strategies, vs request caregiver assist for difficult tasks.    Time 6    Period Weeks    Status Achieved    Target Date 12/22/20               OT Long Term Goals - 12/31/20 0828       OT LONG TERM GOAL #1   Title Pt will increase bilat shoulder strength by 1MM grade in order to improve tolerance to UB ADLs/IADLs and reaching activities.    Baseline Eval: R/L shoulder flex/abd 4-/5, R/L shoulder ER 3+/5; stamina is improving but strength grades still same as eval; R/L shoulder flex/abd 4-, L ER 4-, R ER 3+, L/R IR 4 (pt reports now able to reach easier into overhead kitchen cabinets).    Time 12    Period Weeks    Status On-going    Target Date 03/15/21      OT LONG TERM GOAL #2   Title Pt will manage clothing  fasteners and looping belt with modified indep.    Baseline Eval: spouse assists.  Difficulty with tying shoes, looping belt, snapping and buttoning buttons; Using compensatory strategies for shoe tying as pt now has elastic shoe laces.  Pt now managing his own belt with slight extra time.  Increased time to fasten buttons;  12/22/2020: Pt is consistent to loop belt using compensatory strategies, but struggles with buttoning shirts consistently and typically spouse will assist    Time 12    Period Weeks    Status On-going    Target Date 03/15/21      OT LONG TERM GOAL #3   Title Pt will cut food with modified indep.    Baseline Eval: spouse assists to cut food; Indep to cut food; 12/22/2020: Pt now struggles to cut meat; 12/31/20: Pt can cut chicken with min vc to use large/exaggerated sawing motions but spouse cuts steak.    Time 12    Period Weeks    Status On-going    Target Date 03/15/21      OT LONG TERM GOAL #4   Title Pt will brush hair with R dominant arm with modified indep.    Baseline Eval: Pt states he must hold R arm up with L hand, and often spouse must assist; 11/19/2020: Pt can reach to top and back of head with RUE but arm fatigues quickly with repetition required to brush hair.    Time 12    Period Weeks    Status Achieved    Target Date 12/22/20      OT LONG TERM GOAL #5   Title Pt will increase FOTO score to 62 or better to indicate measureable functional improvement with ADLs/IADLs.    Baseline Eval: FOTO score of 58; 12/22/2020: 57    Time 12    Period Weeks    Status On-going    Target Date 03/15/21      OT LONG TERM GOAL #6   Title Pt will improve time management skills utilizing external compensatory strategies such as calendars, schedules, alarms as needed with min A.    Baseline 12/22/2020: Not yet initiated (spouse reports that pt is taking longer to complete ADLs yet he is not allowing extra time to complete, causing a rush to get out the door).    Time 12     Period Weeks    Status On-going    Target Date 03/15/21      OT LONG TERM GOAL #7   Title Pt will improve dynamic standing balance to enable pt to feed dogs with modified indep.    Baseline 12/22/2020: Several falls over the last week; pt is inconsistent to use his rollator for dynamic standing tasks.  Would need supv to feed dogs.    Time 12    Period Weeks    Status On-going    Target Date 03/15/21             Plan - 01/26/21 1648     Clinical Impression Statement Good carry over today with "reset" techniques during freezing episodes with UE activity.  Reviewed passive stretching for bilat shoulders and instructed in new postural stretch against the wall; encouraged daily completion.  Reinforced benefits of maintaining erect posture and shoulder flexibility to ease ADL performance.  Spouse reports that she bought pt some jigsaw puzzles for a cognitive task at home, as recommended by OT.  Pt will continue to benefit from OT for HEP progression and management of chronic/progressive UE deficits including weakness, stiffness, and impaired coordination which affect ADL performance.    OT Occupational Profile and History Detailed Assessment- Review of Records and additional review of physical, cognitive, psychosocial history related to current functional performance    Occupational performance deficits (Please refer to evaluation for details): ADL's;IADL's;Leisure    Body Structure / Function /  Physical Skills ADL;Coordination;Endurance;GMC;UE functional use;Balance;Sensation;IADL;Vision;Dexterity;FMC;Strength;Continence;Gait;Mobility    Rehab Potential Good    Clinical Decision Making Several treatment options, min-mod task modification necessary    Comorbidities Affecting Occupational Performance: May have comorbidities impacting occupational performance    Modification or Assistance to Complete Evaluation  Min-Moderate modification of tasks or assist with assess necessary to complete eval     OT Frequency 2x / week    OT Duration 12 weeks    OT Treatment/Interventions Self-care/ADL training;Therapeutic exercise;DME and/or AE instruction;Balance training;Neuromuscular education;Manual Therapy;Visual/perceptual remediation/compensation;Therapeutic activities;Patient/family education    Consulted and Agree with Plan of Care Patient             Patient will benefit from skilled therapeutic intervention in order to improve the following deficits and impairments:   Body Structure / Function / Physical Skills: ADL, Coordination, Endurance, GMC, UE functional use, Balance, Sensation, IADL, Vision, Dexterity, FMC, Strength, Continence, Gait, Mobility       Visit Diagnosis: Muscle weakness (generalized)  Other lack of coordination  Parkinson's disease [facility])    Problem List Patient Active Problem List   Diagnosis Date Noted   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

## 2021-01-28 ENCOUNTER — Other Ambulatory Visit: Payer: Self-pay

## 2021-01-28 ENCOUNTER — Ambulatory Visit: Payer: Medicare PPO

## 2021-01-28 DIAGNOSIS — G20 Parkinson's disease: Secondary | ICD-10-CM | POA: Diagnosis not present

## 2021-01-28 DIAGNOSIS — R278 Other lack of coordination: Secondary | ICD-10-CM

## 2021-01-28 DIAGNOSIS — M6281 Muscle weakness (generalized): Secondary | ICD-10-CM

## 2021-01-28 DIAGNOSIS — R2681 Unsteadiness on feet: Secondary | ICD-10-CM

## 2021-01-28 NOTE — Therapy (Signed)
[place] MAIN [facility] SERVICES [address] [place], [place], [address] Phone: [phone]   Fax:  [phone]  Physical Therapy Treatment  Patient Details  Name: Cory [name] MRN: [id number] Date of Birth: 1955/10/31 Referring Provider (PT): Benay Spice   Encounter Date: 01/28/2021   PT End of Session - 01/28/21 1240     Visit Number 34    Number of Visits 45    Date for PT Re-Evaluation 03/04/21    Authorization Type [employer] P[employer]    Authorization Time Period Cert 06/11/86-28/00/34    Progress Note Due on Visit 30    PT Start Time 1100    PT Stop Time 1144    PT Time Calculation (min) 44 min    Equipment Utilized During Treatment Gait belt    Activity Tolerance Patient tolerated treatment well    Behavior During Therapy WFL for tasks assessed/performed             Past Medical History:  Diagnosis Date   Arthritis    hips and back   Asthma    as a baby-    GERD (gastroesophageal reflux disease)    rare -    Parkinson's disease ([place])    Pneumonia    age 65   Rash    legs- on going    Past Surgical History:  Procedure Laterality Date   APPENDECTOMY     BACK SURGERY     posterior lumbar spine fusion L4-5   COLONOSCOPY     COLONOSCOPY WITH PROPOFOL N/A 06/06/2017   Procedure: COLONOSCOPY WITH PROPOFOL;  Surgeon: Manya Silvas, MD;  Location: [facility] ENDOSCOPY;  Service: Endoscopy;  Laterality: N/A;   KNEE ARTHROSCOPY Bilateral    ACL   KNEE ARTHROSCOPY W/ ACL RECONSTRUCTION     RADIAL KERATOTOMY     ROTATOR CUFF REPAIR Bilateral    SHOULDER ARTHROSCOPY WITH BICEPS TENDON REPAIR Left 04/16/2019   Procedure: SHOULDER ARTHROSCOPY WITH BICEPS TENDON REPAIR, MINI OPEN SUPERIOR CAPSULAR RECONSTRUCTION, BICEPS TENODESIS;  Surgeon: Leim Fabry, MD;  Location: [facility] ORS;  Service: Orthopedics;  Laterality: Left;    There were no vitals filed for this visit.   Subjective Assessment - 01/28/21 1104      Subjective Patient went over on the rail and got caught on a ramp yesterday, didn't fall to the floor. Was using the cane, foot caught on the floor.    Patient is accompained by: Family member    Pertinent History Patient admitted to hospital 05/23/20 with code stroke and R sided weakness. Imaging negative for acute changes. PMH includes Parkinson's with autonomic instability, asthma, GERD depression, degenerative spondylolisthesis, focal seizures and migraine syndrome. Patient had three ED visits for falls since then. Patient reports when he has the seizures he has pain in his temples and bright flashes in his vision. Last week had three falls in one day.    Limitations Lifting;Standing;Walking;House hold activities    How long can you sit comfortably? sleeps in easy chair.    How long can you stand comfortably? feel unsteady once in standing.    How long can you walk comfortably? difficult to walk during "off period"    Patient Stated Goals stability, have a device that will be most helpful, help to become and stay independent.    Currently in Pain? No/denies                    Treatment:    Airex  pad static stand with patient cue to decrease weight shift on LLE Airex pad dynadisc 30 seconds modified tandem stance Bosu ball: flat side up: static stand 60 seconds ; x 2 trials cue to decrease weight shift onto LLE   march with two therabands on floor for placement of BOS: challenging to maintain widened BOS ; 3 sets: first set with standard march, 2nd set counting down from 100 by 5's, third set naming food in alphabetical order. ~8 minutes total  Sit to stand with focus on stabilization at top of stand 15x total Posterior lunge with high knee 10x each side, SUE support  In hallway: ambulate in hallway: -horizontal head turns with cues for reading alphabet from cards 86 ftx 2 sets -horizontal head turns with cues for reading number and symbols from cards 86 ft x 2 sets  -"red  light/green light" for sudden initiation/termination of ambulation with close CGA for carryover to natural environment 2x 86 ft   Ambulate with widened BOS with frequent stops to re-adjust base of support to widened stance due to preference for scissoring pattern. 6x 86 ft         Pt educated throughout session about proper posture and technique with exercises. Improved exercise technique, movement at target joints, use of target muscles after min to mod verbal, visual, tactile cues.      Patient has increased weight shift onto LLE causing limited ability for patient to re-center himself requiring occasional min A for centering. Patient is challenged with maintaining a widened BOS with ambulation and dual task requiring focused task training to reduce scissoring steppage. Patient will continue to benefit from skilled PT to address core strengthening, postural exercises, balance, and functional mobility to increase safety and independence with ADLs.                PT Education - 01/28/21 1101     Education Details exercise technique, body mechanics    Person(s) Educated Patient    Methods Explanation;Demonstration;Tactile cues;Verbal cues    Comprehension Verbalized understanding;Returned demonstration;Verbal cues required;Tactile cues required              PT Short Term Goals - 10/21/20 0903       PT SHORT TERM GOAL #1   Title Patient will be independent in home exercise program to improve strength/mobility for better functional independence with ADLs.    Baseline 6/16: HEP to be given next session 7/19: HEP compliant    Time 4    Period Weeks    Status Achieved    Target Date 10/16/20               PT Long Term Goals - 01/16/21 1018       PT LONG TERM GOAL #1   Title Patient will increase FOTO score to equal to or greater than 51%    to demonstrate statistically significant improvement in mobility and quality of life.    Baseline 6/16: 48% 7/19: 57%  8/31: 50%    Time 12    Period Weeks    Status Achieved      PT LONG TERM GOAL #2   Title Patient will increase Berg Balance score by > 6 points (34/56)  to demonstrate decreased fall risk during functional activities.    Baseline 6/16: 28/56 7/19: 45/56    Time 12    Period Weeks    Status Achieved      PT LONG TERM GOAL #3   Title Patient will deny  any falls over past 4 weeks to demonstrate improved safety awareness at home and work.    Baseline 6/16: multiple falls 7/19: no falls reported 9/7: 3 falls in past month; 2 falls in past month    Time 12    Period Weeks    Status On-going    Target Date 03/04/21      PT LONG TERM GOAL #4   Title Patient will increase six minute walk test distance to >1300 for progression to age norm community ambulator and improve gait ability    Baseline 6/16: 945 ft with multiple near falls 7/19: 1495 ft    Time 12    Period Weeks    Status Achieved      PT LONG TERM GOAL #5   Title Patient will increase Berg Balance score by > 6 points (51/56)  to demonstrate decreased fall risk during functional activities.    Baseline 7/19: 45/56 8/31: 49/56; 10/14: 52/56    Time 12    Period Weeks    Status Partially Met    Target Date 03/04/21      PT LONG TERM GOAL #6   Title Patient will increase Functional Gait Assessment score to >20/30 as to reduce fall risk and improve dynamic gait safety with community ambulation.    Baseline 7/19: 16/30 8/31: 20/30;    Time 12    Period Weeks    Status Achieved      PT LONG TERM GOAL #7   Title Patient will increase Functional Gait Assessment score to >26/30 as to reduce fall risk and improve dynamic gait safety with community ambulation.    Baseline 8/31: 20/30; 10/14: 22/30    Time 12    Period Weeks    Status On-going    Target Date 01/16/21      PT LONG TERM GOAL #8   Title Patient will static stand for approximately 5 minutes and perturbations for shooting/teaching classes without loss of balance or  requiring object to lean on.    Baseline Unable to perform without leaning on objects; 10/14: Still unable to perform without leaning on objects.    Time 12    Period Weeks    Status On-going    Target Date 03/04/21      PT LONG TERM GOAL  #9   TITLE Patient will be able to ambulate approximately 1 mile walking dog across changing surfaces such as grass, concrete, and sidewalk without loss of balance.    Baseline 9/7: unable to perform yet; 10/14: Unable    Time 12    Period Weeks    Status On-going    Target Date 03/04/21      PT LONG TERM GOAL  #10   TITLE Patient will bend down and pick up items without losing balance to perform iADLs such as emptying dishwasher and feeding dog.    Baseline 9/7: unable to perform; 10/14: Performing at home without falls or LOB.    Time 12    Period Weeks    Status Achieved    Target Date 03/04/21                   Plan - 01/28/21 1241     Clinical Impression Statement Patient has increased weight shift onto LLE causing limited ability for patient to re-center himself requiring occasional min A for centering. Patient is challenged with maintaining a widened BOS with ambulation and dual task requiring focused task training to reduce scissoring steppage. Patient  will continue to benefit from skilled PT to address core strengthening, postural exercises, balance, and functional mobility to increase safety and independence with ADLs.    Personal Factors and Comorbidities Age;Comorbidity 3+;Fitness;Past/Current Experience;Time since onset of injury/illness/exacerbation;Transportation    Comorbidities Parkinson's with autonomic instability, asthma, GERD depression, degenerative spondylolisthesis, focal seizures and migraine syndrome    Examination-Activity Limitations Bathing;Bed Mobility;Bend;Caring for [employer];Locomotion Level;Lift;Hygiene/Grooming;Squat;Stairs;Stand;Toileting;Transfers    Examination-Participation  Restrictions Church;Cleaning;Community Activity;Driving;Meal Prep;Medication Management;Laundry;Shop;Volunteer;Yard Work    [employer]chant [employer] officer Evolving/Moderate complexity    Rehab Potential Fair    PT Frequency 2x / week    PT Duration 12 weeks    PT Treatment/Interventions ADLs/Self Care Home Management;Biofeedback;Canalith Repostioning;Cryotherapy;Parrafin;Ultrasound;Traction;Moist Heat;Iontophoresis 21m/ml Dexamethasone;Electrical Stimulation;DME Instruction;Gait training;Stair training;Functional mobility training;Neuromuscular re-education;Balance training;Therapeutic exercise;Therapeutic activities;Patient/family education;Orthotic Fit/Training;Manual techniques;Passive range of motion;Manual lymph drainage;Energy conservation;Splinting;Taping;Vasopneumatic Device;Vestibular;Joint Manipulations;Visual/perceptual remediation/compensation    PT Next Visit Plan Increase cognitive demand/dual tasking during gait, progress thoracic opening and rotaiton exercises, core strengthening, progress dynamic balance and gait training for increased BOS and dual tasking.    PT Home Exercise Plan No updates today    Consulted and Agree with Plan of Care Patient    Family Member Consulted wife             Patient will benefit from skilled therapeutic intervention in order to improve the following deficits and impairments:  Abnormal gait, Decreased activity tolerance, Decreased balance, Decreased knowledge of precautions, Decreased endurance, Decreased coordination, Decreased cognition, Decreased knowledge of use of DME, Decreased mobility, Decreased safety awareness, Difficulty walking, Decreased strength, Impaired flexibility, Impaired perceived functional ability, Impaired sensation, Impaired tone, Impaired UE functional use, Impaired vision/preception, Improper body mechanics, Postural dysfunction  Visit Diagnosis: Muscle weakness (generalized)  Other lack of coordination  Parkinson's  disease ([place]  Unsteadiness on feet     Problem List Patient Active Problem List   Diagnosis Date Noted   [place] ([place] 05/24/[place]   [place]    History of migraine headaches    Migraine syndrome    BPH (benign prostatic hyperplasia)    Depression    Degenerative spondylolisthesis 05/07/2016   MJanna Arch PT, DPT  01/28/2021, 12:45 PM  [place] [address]Phone: [phone]  Fax:  [phone] Name: Cory LUKACSMRN: [id number] of Birth: 716-Apr-1957

## 2021-01-29 NOTE — Therapy (Signed)
[place] MAIN [facility] SERVICES [address] [place], [place], [address] Phone: [phone]   Fax:  [phone]  Occupational Therapy Treatment  Patient Details  Name: Cory [name] MRN: [id number] Date of Birth: 10/10/1955 No data recorded  Encounter Date: 01/28/2021   OT End of Session - 01/29/21 1355     Visit Number 28    Number of Visits 66    Date for OT Re-Evaluation 03/15/21    Authorization Time Period Reporting period starting 01/05/2021    OT Start Time 1145    OT Stop Time 17    OT Time Calculation (min) 45 min    Equipment Utilized During Treatment 4 wheeled walker    Activity Tolerance Patient tolerated treatment well    Behavior During Therapy WFL for tasks assessed/performed             Past Medical History:  Diagnosis Date   Arthritis    hips and back   Asthma    as a baby-    GERD (gastroesophageal reflux disease)    rare -    Parkinson's disease ([place])    Pneumonia    age 40   Rash    legs- on going    Past Surgical History:  Procedure Laterality Date   APPENDECTOMY     BACK SURGERY     posterior lumbar spine fusion L4-5   COLONOSCOPY     COLONOSCOPY WITH PROPOFOL N/A 06/06/2017   Procedure: COLONOSCOPY WITH PROPOFOL;  Surgeon: Manya Silvas, MD;  Location: [facility] ENDOSCOPY;  Service: Endoscopy;  Laterality: N/A;   KNEE ARTHROSCOPY Bilateral    ACL   KNEE ARTHROSCOPY W/ ACL RECONSTRUCTION     RADIAL KERATOTOMY     ROTATOR CUFF REPAIR Bilateral    SHOULDER ARTHROSCOPY WITH BICEPS TENDON REPAIR Left 04/16/2019   Procedure: SHOULDER ARTHROSCOPY WITH BICEPS TENDON REPAIR, MINI OPEN SUPERIOR CAPSULAR RECONSTRUCTION, BICEPS TENODESIS;  Surgeon: Leim Fabry, MD;  Location: [facility] ORS;  Service: Orthopedics;  Laterality: Left;    There were no vitals filed for this visit.   Subjective Assessment - 01/28/21 1351     Subjective  "I'm going to start timing myself with my morning routine so that I can see  how long I need to plan for everything so I don't have to rush."    Patient is accompanied by: Family member    Pertinent History Parkinson's Disease, R sided weakness, falls    Limitations balance, strength, coordination, visual perception, problem solving, processing information    Patient Stated Goals "I want to get stronger.  It frustrates me when I have to ask for help with things like cutting my food or buttoning a button."    Currently in Pain? Yes    Pain Score 8     Pain Location Shoulder    Pain Orientation Left    Pain Descriptors / Indicators Aching;Sore    Pain Type Chronic pain    Pain Onset More than a month ago    Pain Frequency Intermittent    Aggravating Factors  raising arm up past shoulder level    Pain Relieving Factors rest    Effect of Pain on Daily Activities limited tolerance for reaching    Multiple Pain Sites No            Occupational Therapy Treatment: Therapeutic Exercise: Performed passive stretching to bilat shoulders all planes to reduce stiffness/pain/increase ROM for self care.  Reinforced benefits of daily self ROM  at home to maintain shoulder flexibility for self care.  Pt verbalized understanding.  Utilized EZ board to address increasing strength for R/L pinch, digit flex/ext, wrist flex/ext, forearm pron/sup with focus on large movement patterns to move pieces against velcro resistance.  Occasional cues to "reset" during freezing episodes; pt reports good carry over with "reset" technique at home.   Response to Treatment: See Plan/clinical impression below.       OT Education - 01/28/21 1354     Education Details shoulder stretches, [place] activities    Person(s) Educated Patient;Spouse    Methods Explanation;Verbal cues;Demonstration    Comprehension Verbalized understanding;Need further instruction;Verbal cues required;Returned demonstration              OT Short Term Goals - 12/31/20 0822       OT SHORT TERM GOAL #1   Title Pt  will perform BUE HEP with min vc    Baseline Eval: not yet initiated; 11/19/2020: Pt able to perform with min vc; 12/22/20: indep with cane exercises, need to instruct further in theraband exercises    Time 6    Period Weeks    Status On-going    Target Date 02/01/21      OT [place] #2   Title Pt will report RPE of 4 or better to indicate improved tolerance to basic ADLs.    Baseline Eval: RPE of 6 with basic ADLs; 11/19/2020: RPE of 5 with basic ADLs; 12/22/20: RPE for ADLs fluctuates greatly from 4 on a good day, to 10 on a bad day; 12/31/20: RPE continues to fluctate greatly throughout the week 4-10    Time 6    Period Weeks    Status On-going    Target Date 02/01/21      OT SHORT TERM GOAL #3   Title Pt will utilize visual compensation strategies with min vc in order to improve basic and IADL performance.    Baseline Eval: pt reports some diplopia and decreased depth perception which limits ADL/IADL performance; Pt has been instructed in diplopia exercises and requires mod vc for compensation strategies when diplopia is present; 12/22/20: pt is indep with visual exercises and knowing when to allow rest breaks or use compensatory strategies, vs request caregiver assist for difficult tasks.    Time 6    Period Weeks    Status Achieved    Target Date 12/22/20               OT Long Term Goals - 12/31/20 0828       OT LONG TERM GOAL #1   Title Pt will increase bilat shoulder strength by 1MM grade in order to improve tolerance to UB ADLs/IADLs and reaching activities.    Baseline Eval: R/L shoulder flex/abd 4-/5, R/L shoulder ER 3+/5; stamina is improving but strength grades still same as eval; R/L shoulder flex/abd 4-, L ER 4-, R ER 3+, L/R IR 4 (pt reports now able to reach easier into overhead kitchen cabinets).    Time 12    Period Weeks    Status On-going    Target Date 03/15/21      OT LONG TERM GOAL #2   Title Pt will manage clothing fasteners and looping belt with  modified indep.    Baseline Eval: spouse assists.  Difficulty with tying shoes, looping belt, snapping and buttoning buttons; Using compensatory strategies for shoe tying as pt now has elastic shoe laces.  Pt now managing his own belt with slight extra  time.  Increased time to fasten buttons; 12/22/2020: Pt is consistent to loop belt using compensatory strategies, but struggles with buttoning shirts consistently and typically spouse will assist    Time 12    Period Weeks    Status On-going    Target Date 03/15/21      OT LONG TERM GOAL #3   Title Pt will cut food with modified indep.    Baseline Eval: spouse assists to cut food; Indep to cut food; 12/22/2020: Pt now struggles to cut meat; 12/31/20: Pt can cut chicken with min vc to use large/exaggerated sawing motions but spouse cuts steak.    Time 12    Period Weeks    Status On-going    Target Date 03/15/21      OT LONG TERM GOAL #4   Title Pt will brush hair with R dominant arm with modified indep.    Baseline Eval: Pt states he must hold R arm up with L hand, and often spouse must assist; 11/19/2020: Pt can reach to top and back of head with RUE but arm fatigues quickly with repetition required to brush hair.    Time 12    Period Weeks    Status Achieved    Target Date 12/22/20      OT LONG TERM GOAL #5   Title Pt will increase FOTO score to 62 or better to indicate measureable functional improvement with ADLs/IADLs.    Baseline Eval: FOTO score of 58; 12/22/2020: 57    Time 12    Period Weeks    Status On-going    Target Date 03/15/21      OT LONG TERM GOAL #6   Title Pt will improve time management skills utilizing external compensatory strategies such as calendars, schedules, alarms as needed with min A.    Baseline 12/22/2020: Not yet initiated (spouse reports that pt is taking longer to complete ADLs yet he is not allowing extra time to complete, causing a rush to get out the door).    Time 12    Period Weeks    Status On-going     Target Date 03/15/21      OT LONG TERM GOAL #7   Title Pt will improve dynamic standing balance to enable pt to feed dogs with modified indep.    Baseline 12/22/2020: Several falls over the last week; pt is inconsistent to use his rollator for dynamic standing tasks.  Would need supv to feed dogs.    Time 12    Period Weeks    Status On-going    Target Date 03/15/21              Plan - 01/28/21 1413     Clinical Impression Statement Unable to tolerate resistance for shoulder strengthening this day d/t reported pain in L shoulder of 8/10.  Good response to passive stretching which helped to decrease pain to min-moderate level.  Pt continues to utilize "reset" technique during freezing episodes with Ues with intermittent vc.  Focus today on larger/exaggerated movement patterns when manipulating objects with hands; good ability to perform these movement patterns with intermittent vc for carry over.  Pt will continue to benefit from OT to address UE deficits related to PD in order to maximize safety, indep, and efficiency with self care.    OT Occupational Profile and History Detailed Assessment- Review of Records and additional review of physical, cognitive, psychosocial history related to current functional performance    Occupational performance deficits (Please refer  to evaluation for details): ADL's;IADL's;Leisure    Body Structure / Function / Physical Skills ADL;Coordination;Endurance;GMC;UE functional use;Balance;Sensation;IADL;Vision;Dexterity;FMC;Strength;Continence;Gait;Mobility    Rehab Potential Good    Clinical Decision Making Several treatment options, min-mod task modification necessary    Comorbidities Affecting Occupational Performance: May have comorbidities impacting occupational performance    Modification or Assistance to Complete Evaluation  Min-Moderate modification of tasks or assist with assess necessary to complete eval    OT Frequency 2x / week    OT Duration 12  weeks    OT Treatment/Interventions Self-care/ADL training;Therapeutic exercise;DME and/or AE instruction;Balance training;Neuromuscular education;Manual Therapy;Visual/perceptual remediation/compensation;Therapeutic activities;Patient/family education    Consulted and Agree with Plan of Care Patient             Patient will benefit from skilled therapeutic intervention in order to improve the following deficits and impairments:   Body Structure / Function / Physical Skills: ADL, Coordination, Endurance, GMC, UE functional use, Balance, Sensation, IADL, Vision, Dexterity, FMC, Strength, Continence, Gait, Mobility       Visit Diagnosis: Muscle weakness (generalized)  Other lack of coordination  Parkinson's disease [facility])    Problem List Patient Active Problem List   Diagnosis Date Noted   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 [place], [place], [address] Phone: [phone]   Fax:  [phone]  Name: WENZEL BACKLUND MRN: [id number] Date of Birth: Jul 18, 1955

## 2021-02-02 ENCOUNTER — Ambulatory Visit: Payer: Medicare PPO

## 2021-02-02 ENCOUNTER — Other Ambulatory Visit: Payer: Self-pay

## 2021-02-02 DIAGNOSIS — G20 Parkinson's disease: Secondary | ICD-10-CM

## 2021-02-02 DIAGNOSIS — R278 Other lack of coordination: Secondary | ICD-10-CM

## 2021-02-02 DIAGNOSIS — M6281 Muscle weakness (generalized): Secondary | ICD-10-CM

## 2021-02-02 NOTE — Therapy (Signed)
[place] MAIN [facility] SERVICES [address] [place], [place], [address] Phone: [phone]   Fax:  [phone]  Physical Therapy Treatment  Patient Details  Name: Cory [name] MRN: [id number] Date of Birth: 04-30-1955 Referring Provider (PT): Benay Spice   Encounter Date: 02/02/2021   PT End of Session - 02/02/21 1107     Visit Number 35    Number of Visits 45    Date for PT Re-Evaluation 03/04/21    Authorization Type [employer] PPO    Authorization Time Period Cert 09/10/18-94/70/96    Progress Note Due on Visit 30    PT Start Time 1101    PT Stop Time 1145    PT Time Calculation (min) 44 min    Equipment Utilized During Treatment Gait belt    Activity Tolerance Patient tolerated treatment well    Behavior During Therapy WFL for tasks assessed/performed             Past Medical History:  Diagnosis Date   Arthritis    hips and back   Asthma    as a baby-    GERD (gastroesophageal reflux disease)    rare -    Parkinson's disease ([place])    Pneumonia    age 65   Rash    legs- on going    Past Surgical History:  Procedure Laterality Date   APPENDECTOMY     BACK SURGERY     posterior lumbar spine fusion L4-5   COLONOSCOPY     COLONOSCOPY WITH PROPOFOL N/A 06/06/2017   Procedure: COLONOSCOPY WITH PROPOFOL;  Surgeon: Manya Silvas, MD;  Location: [facility] ENDOSCOPY;  Service: Endoscopy;  Laterality: N/A;   KNEE ARTHROSCOPY Bilateral    ACL   KNEE ARTHROSCOPY W/ ACL RECONSTRUCTION     RADIAL KERATOTOMY     ROTATOR CUFF REPAIR Bilateral    SHOULDER ARTHROSCOPY WITH BICEPS TENDON REPAIR Left 04/16/2019   Procedure: SHOULDER ARTHROSCOPY WITH BICEPS TENDON REPAIR, MINI OPEN SUPERIOR CAPSULAR RECONSTRUCTION, BICEPS TENODESIS;  Surgeon: Leim Fabry, MD;  Location: [facility] ORS;  Service: Orthopedics;  Laterality: Left;    There were no vitals filed for this visit.   Subjective Assessment - 02/02/21 1106      Subjective Patient reports compliance with HEP. Worked in the shop over the weekend.    Patient is accompained by: Family member    Pertinent History Patient admitted to hospital 05/23/20 with code stroke and R sided weakness. Imaging negative for acute changes. PMH includes Parkinson's with autonomic instability, asthma, GERD depression, degenerative spondylolisthesis, focal seizures and migraine syndrome. Patient had three ED visits for falls since then. Patient reports when he has the seizures he has pain in his temples and bright flashes in his vision. Last week had three falls in one day.    Limitations Lifting;Standing;Walking;House hold activities    How long can you sit comfortably? sleeps in easy chair.    How long can you stand comfortably? feel unsteady once in standing.    How long can you walk comfortably? difficult to walk during "off period"    Patient Stated Goals stability, have a device that will be most helpful, help to become and stay independent.    Currently in Pain? No/denies                     Treatment:    Nustep Lvl 4 RPM>60 ; 4 minutes for cardiovascular support    march with  two therabands on floor for placement of BOS: challenging to maintain widened BOS; 12x hip flexion each LE;  Heel toe raise 20x with SUE support Seated: RTB toe taps 30 seconds ; very challenging for patient to perform.  Sit to stand with jump 10x close CGA required  6" step: close CGA  -lateral step up 10x each LE; no UE support -step up/forward/backwards 10x each LE ; cue for keeping feet apart    In hallway: ambulate in hallway: cues for keeping feet apart for widened BOS  Rainbow ball toss 86 ft x 4 trials forward/backwards  Rainbow ball lateral toss 86 ft x 4 trials;  -"red light/green light" for sudden initiation/termination of ambulation with close CGA for carryover to natural environment 2x 86 ft    Ambulate with widened BOS with frequent stops to re-adjust base of  support to widened stance due to preference for scissoring pattern. 6x 86 ft           Pt educated throughout session about proper posture and technique with exercises. Improved exercise technique, movement at target joints, use of target muscles after min to mod verbal, visual, tactile cues.     Patient demonstrates good carryover of widened BOS from previous session but will continue to benefit from focused therapy He is challenged with coordinated toe taps in seated position with increased velocity. The more components a task has the more challenging it was for the patient to complete. Patient will continue to benefit from skilled PT to address core strengthening, postural exercises, balance, and functional mobility to increase safety and independence with ADLs                PT Education - 02/02/21 1107     Education Details exercise technique, body mechanics    Person(s) Educated Patient    Methods Explanation;Demonstration;Tactile cues;Verbal cues    Comprehension Verbalized understanding;Returned demonstration;Verbal cues required;Tactile cues required              PT Short Term Goals - 10/21/20 0903       PT SHORT TERM GOAL #1   Title Patient will be independent in home exercise program to improve strength/mobility for better functional independence with ADLs.    Baseline 6/16: HEP to be given next session 7/19: HEP compliant    Time 4    Period Weeks    Status Achieved    Target Date 10/16/20               PT Long Term Goals - 01/16/21 1018       PT LONG TERM GOAL #1   Title Patient will increase FOTO score to equal to or greater than 51%    to demonstrate statistically significant improvement in mobility and quality of life.    Baseline 6/16: 48% 7/19: 57% 8/31: 50%    Time 12    Period Weeks    Status Achieved      PT LONG TERM GOAL #2   Title Patient will increase Berg Balance score by > 6 points (34/56)  to demonstrate decreased fall  risk during functional activities.    Baseline 6/16: 28/56 7/19: 45/56    Time 12    Period Weeks    Status Achieved      PT LONG TERM GOAL #3   Title Patient will deny any falls over past 4 weeks to demonstrate improved safety awareness at home and work.    Baseline 6/16: multiple falls 7/19: no falls reported  9/7: 3 falls in past month; 2 falls in past month    Time 12    Period Weeks    Status On-going    Target Date 03/04/21      PT LONG TERM GOAL #4   Title Patient will increase six minute walk test distance to >1300 for progression to age norm community ambulator and improve gait ability    Baseline 6/16: 945 ft with multiple near falls 7/19: 1495 ft    Time 12    Period Weeks    Status Achieved      PT LONG TERM GOAL #5   Title Patient will increase Berg Balance score by > 6 points (51/56)  to demonstrate decreased fall risk during functional activities.    Baseline 7/19: 45/56 8/31: 49/56; 10/14: 52/56    Time 12    Period Weeks    Status Partially Met    Target Date 03/04/21      PT LONG TERM GOAL #6   Title Patient will increase Functional Gait Assessment score to >20/30 as to reduce fall risk and improve dynamic gait safety with community ambulation.    Baseline 7/19: 16/30 8/31: 20/30;    Time 12    Period Weeks    Status Achieved      PT LONG TERM GOAL #7   Title Patient will increase Functional Gait Assessment score to >26/30 as to reduce fall risk and improve dynamic gait safety with community ambulation.    Baseline 8/31: 20/30; 10/14: 22/30    Time 12    Period Weeks    Status On-going    Target Date 01/16/21      PT LONG TERM GOAL #8   Title Patient will static stand for approximately 5 minutes and perturbations for shooting/teaching classes without loss of balance or requiring object to lean on.    Baseline Unable to perform without leaning on objects; 10/14: Still unable to perform without leaning on objects.    Time 12    Period Weeks    Status  On-going    Target Date 03/04/21      PT LONG TERM GOAL  #9   TITLE Patient will be able to ambulate approximately 1 mile walking dog across changing surfaces such as grass, concrete, and sidewalk without loss of balance.    Baseline 9/7: unable to perform yet; 10/14: Unable    Time 12    Period Weeks    Status On-going    Target Date 03/04/21      PT LONG TERM GOAL  #10   TITLE Patient will bend down and pick up items without losing balance to perform iADLs such as emptying dishwasher and feeding dog.    Baseline 9/7: unable to perform; 10/14: Performing at home without falls or LOB.    Time 12    Period Weeks    Status Achieved    Target Date 03/04/21                   Plan - 02/02/21 1148     Clinical Impression Statement Patient demonstrates good carryover of widened BOS from previous session but will continue to benefit from focused therapy He is challenged with coordinated toe taps in seated position with increased velocity. The more components a task has the more challenging it was for the patient to complete. Patient will continue to benefit from skilled PT to address core strengthening, postural exercises, balance, and functional mobility to increase safety and  independence with ADLs    Personal Factors and Comorbidities Age;Comorbidity 3+;Fitness;Past/Current Experience;Time since onset of injury/illness/exacerbation;Transportation    Comorbidities Parkinson's with autonomic instability, asthma, GERD depression, degenerative spondylolisthesis, focal seizures and migraine syndrome    Examination-Activity Limitations Bathing;Bed Mobility;Bend;Caring for [employer];Locomotion Level;Lift;Hygiene/Grooming;Squat;Stairs;Stand;Toileting;Transfers    Examination-Participation Restrictions Church;Cleaning;Community Activity;Driving;Meal Prep;Medication Management;Laundry;Shop;Volunteer;Yard Work    [employer]chant [employer] Evolving/Moderate  complexity    Rehab Potential Fair    PT Frequency 2x / week    PT Duration 12 weeks    PT Treatment/Interventions ADLs/Self Care Home Management;Biofeedback;Canalith Repostioning;Cryotherapy;Parrafin;Ultrasound;Traction;Moist Heat;Iontophoresis 30m/ml Dexamethasone;Electrical Stimulation;DME Instruction;Gait training;Stair training;Functional mobility training;Neuromuscular re-education;Balance training;Therapeutic exercise;Therapeutic activities;Patient/family education;Orthotic Fit/Training;Manual techniques;Passive range of motion;Manual lymph drainage;Energy conservation;Splinting;Taping;Vasopneumatic Device;Vestibular;Joint Manipulations;Visual/perceptual remediation/compensation    PT Next Visit Plan Increase cognitive demand/dual tasking during gait, progress thoracic opening and rotaiton exercises, core strengthening, progress dynamic balance and gait training for increased BOS and dual tasking.    PT Home Exercise Plan No updates today    Consulted and Agree with Plan of Care Patient    Family Member Consulted wife             Patient will benefit from skilled therapeutic intervention in order to improve the following deficits and impairments:  Abnormal gait, Decreased activity tolerance, Decreased balance, Decreased knowledge of precautions, Decreased endurance, Decreased coordination, Decreased cognition, Decreased knowledge of use of DME, Decreased mobility, Decreased safety awareness, Difficulty walking, Decreased strength, Impaired flexibility, Impaired perceived functional ability, Impaired sensation, Impaired tone, Impaired UE functional use, Impaired vision/preception, Improper body mechanics, Postural dysfunction  Visit Diagnosis: Muscle weakness (generalized)  Other lack of coordination  [place]inson's disease ([place]     Problem List Patient Active Problem List   Diagnosis Date Noted   [place]ocal [place] ([place] 05/24/2020   [place]'s [place]    History of migraine  headaches    Migraine syndrome    BPH (benign prostatic hyperplasia)    Depression    Degenerative spondylolisthesis 05/07/2016    MJanna Arch PT, DPT  02/02/2021, 11:51 AM  [place] [address]Phone: [phone]  Fax:  [phone] Name: TTAYDON NASWORTHYMRN: [id number] of Birth: [birth year]

## 2021-02-02 NOTE — Therapy (Signed)
[place] MAIN [facility] SERVICES [address] [place], [place], [address] Phone: [phone]   Fax:  [phone]  Occupational Therapy Treatment  Patient Details  Name: Cory [name] MRN: [id number] Date of Birth: 15-Aug-1955 No data recorded  Encounter Date: 02/02/2021   OT End of Session - 02/02/21 1353     Visit Number 29    Number of Visits 58    Date for OT Re-Evaluation 03/15/21    Authorization Time Period Reporting period starting 01/05/2021    OT Start Time 1145    OT Stop Time 1231    OT Time Calculation (min) 46 min    Equipment Utilized During Treatment 4 wheeled walker    Activity Tolerance Patient tolerated treatment well    Behavior During Therapy WFL for tasks assessed/performed             Past Medical History:  Diagnosis Date   Arthritis    hips and back   Asthma    as a baby-    GERD (gastroesophageal reflux disease)    rare -    Parkinson's disease ([place])    Pneumonia    age 62   Rash    legs- on going    Past Surgical History:  Procedure Laterality Date   APPENDECTOMY     BACK SURGERY     posterior lumbar spine fusion L4-5   COLONOSCOPY     COLONOSCOPY WITH PROPOFOL N/A 06/06/2017   Procedure: COLONOSCOPY WITH PROPOFOL;  Surgeon: Manya Silvas, MD;  Location: [facility] ENDOSCOPY;  Service: Endoscopy;  Laterality: N/A;   KNEE ARTHROSCOPY Bilateral    ACL   KNEE ARTHROSCOPY W/ ACL RECONSTRUCTION     RADIAL KERATOTOMY     ROTATOR CUFF REPAIR Bilateral    SHOULDER ARTHROSCOPY WITH BICEPS TENDON REPAIR Left 04/16/2019   Procedure: SHOULDER ARTHROSCOPY WITH BICEPS TENDON REPAIR, MINI OPEN SUPERIOR CAPSULAR RECONSTRUCTION, BICEPS TENODESIS;  Surgeon: Leim Fabry, MD;  Location: [facility] ORS;  Service: Orthopedics;  Laterality: Left;    There were no vitals filed for this visit.   Subjective Assessment - 02/02/21 1351     Subjective  "I can feel myself getting weaker."    Patient is accompanied by: Family  member    Pertinent History Parkinson's Disease, R sided weakness, falls    Limitations balance, strength, coordination, visual perception, problem solving, processing information    Patient Stated Goals "I want to get stronger.  It frustrates me when I have to ask for help with things like cutting my food or buttoning a button."    Currently in Pain? No/denies    Pain Score 0-No pain    Pain Onset More than a month ago            Occupational Therapy Treatment: Therapeutic Exercise: Facilitated hand strengthening with use of hand gripper set at 17.9# to remove jumbo pegs from pegboard x2 trials each hand.  Able to perform without dropping pegs with occasional cue to "reset" during a freezing episode.    Self Care: Educated on [facility] strategies with daily routine, encouraging pt to plan yard work or workshop activities in the afternoons on non-therapy days.  Plan more relaxing activities such as movies on therapy days.  Facilitated bilat hand coordination/motor planning/problem solving/pinch strength with unbuttoning small and large buttons.  Pt requires mod vc for technique, cues to reset during freezing episode, 1 problem solving cue to correct when pulling the wrong direction.  Pt increasing awareness  of when to seek help at home with self care tasks to reduce frustration, conserve energy, and utilize time management, and is more often calling spouse to assist if pt is not progressing with a certain ADL task.  Reviewed item transport strategies for filling up water and food dishes for dog at home.  Pt uses large scooper for food and fills up part way to avoid spilling; fills up pitcher of water to pour over water dish instead of trying to carry dish of water and risk spilling.  Reinforced need to limit bending to reduce fall risk when filling dishes, and ensure pt stabilizes self with UE on support surface while filling dishes in standing, or sit on walker seat for increased stability while pouring.   Pt verbalized understanding.   Response to Treatment: See Plan/clinical impression below.    OT Education - 02/02/21 1353     Education Details EC with planning out afternoon activities    Person(s) Educated Patient;Spouse    Methods Explanation;Verbal cues;Demonstration    Comprehension Verbalized understanding;Need further instruction;Verbal cues required;Returned demonstration              OT Short Term Goals - 12/31/20 0822       OT SHORT TERM GOAL #1   Title Pt will perform BUE HEP with min vc    Baseline Eval: not yet initiated; 11/19/2020: Pt able to perform with min vc; 12/22/20: indep with cane exercises, need to instruct further in theraband exercises    Time 6    Period Weeks    Status On-going    Target Date 02/01/21      OT [place] #2   Title Pt will report RPE of 4 or better to indicate improved tolerance to basic ADLs.    Baseline Eval: RPE of 6 with basic ADLs; 11/19/2020: RPE of 5 with basic ADLs; 12/22/20: RPE for ADLs fluctuates greatly from 4 on a good day, to 10 on a bad day; 12/31/20: RPE continues to fluctate greatly throughout the week 4-10    Time 6    Period Weeks    Status On-going    Target Date 02/01/21      OT SHORT TERM GOAL #3   Title Pt will utilize visual compensation strategies with min vc in order to improve basic and IADL performance.    Baseline Eval: pt reports some diplopia and decreased depth perception which limits ADL/IADL performance; Pt has been instructed in diplopia exercises and requires mod vc for compensation strategies when diplopia is present; 12/22/20: pt is indep with visual exercises and knowing when to allow rest breaks or use compensatory strategies, vs request caregiver assist for difficult tasks.    Time 6    Period Weeks    Status Achieved    Target Date 12/22/20               OT Long Term Goals - 12/31/20 0828       OT LONG TERM GOAL #1   Title Pt will increase bilat shoulder strength by 1MM grade  in order to improve tolerance to UB ADLs/IADLs and reaching activities.    Baseline Eval: R/L shoulder flex/abd 4-/5, R/L shoulder ER 3+/5; stamina is improving but strength grades still same as eval; R/L shoulder flex/abd 4-, L ER 4-, R ER 3+, L/R IR 4 (pt reports now able to reach easier into overhead kitchen cabinets).    Time 12    Period Weeks    Status On-going  Target Date 03/15/21      OT LONG TERM GOAL #2   Title Pt will manage clothing fasteners and looping belt with modified indep.    Baseline Eval: spouse assists.  Difficulty with tying shoes, looping belt, snapping and buttoning buttons; Using compensatory strategies for shoe tying as pt now has elastic shoe laces.  Pt now managing his own belt with slight extra time.  Increased time to fasten buttons; 12/22/2020: Pt is consistent to loop belt using compensatory strategies, but struggles with buttoning shirts consistently and typically spouse will assist    Time 12    Period Weeks    Status On-going    Target Date 03/15/21      OT LONG TERM GOAL #3   Title Pt will cut food with modified indep.    Baseline Eval: spouse assists to cut food; Indep to cut food; 12/22/2020: Pt now struggles to cut meat; 12/31/20: Pt can cut chicken with min vc to use large/exaggerated sawing motions but spouse cuts steak.    Time 12    Period Weeks    Status On-going    Target Date 03/15/21      OT LONG TERM GOAL #4   Title Pt will brush hair with R dominant arm with modified indep.    Baseline Eval: Pt states he must hold R arm up with L hand, and often spouse must assist; 11/19/2020: Pt can reach to top and back of head with RUE but arm fatigues quickly with repetition required to brush hair.    Time 12    Period Weeks    Status Achieved    Target Date 12/22/20      OT LONG TERM GOAL #5   Title Pt will increase FOTO score to 62 or better to indicate measureable functional improvement with ADLs/IADLs.    Baseline Eval: FOTO score of 58;  12/22/2020: 57    Time 12    Period Weeks    Status On-going    Target Date 03/15/21      OT LONG TERM GOAL #6   Title Pt will improve time management skills utilizing external compensatory strategies such as calendars, schedules, alarms as needed with min A.    Baseline 12/22/2020: Not yet initiated (spouse reports that pt is taking longer to complete ADLs yet he is not allowing extra time to complete, causing a rush to get out the door).    Time 12    Period Weeks    Status On-going    Target Date 03/15/21      OT LONG TERM GOAL #7   Title Pt will improve dynamic standing balance to enable pt to feed dogs with modified indep.    Baseline 12/22/2020: Several falls over the last week; pt is inconsistent to use his rollator for dynamic standing tasks.  Would need supv to feed dogs.    Time 12    Period Weeks    Status On-going    Target Date 03/15/21             Plan - 02/02/21 1414     Clinical Impression Statement Spouse typically manages all buttons on clothing at home.  Pt can unbutton small buttons but requires extra time and vc for technique, and typically must have spouse button up.  Pt verbalizes good system for refilling food and water dishes for dog at home.  Pt reports he can feel himself getting weaker.  OT continues to encourage functional tasks and BUE strengthening/coordination  activities at home to slow muscle atrophy.  Pt will continue to benefit from skilled OT for HEP progression/HEP changes/ADL modification/strategies as disease progression occurs.    OT Occupational Profile and History Detailed Assessment- Review of Records and additional review of physical, cognitive, psychosocial history related to current functional performance    Occupational performance deficits (Please refer to evaluation for details): ADL's;IADL's;Leisure    Body Structure / Function / Physical Skills ADL;Coordination;Endurance;GMC;UE functional  use;Balance;Sensation;IADL;Vision;Dexterity;FMC;Strength;Continence;Gait;Mobility    Rehab Potential Good    Clinical Decision Making Several treatment options, min-mod task modification necessary    Comorbidities Affecting Occupational Performance: May have comorbidities impacting occupational performance    Modification or Assistance to Complete Evaluation  Min-Moderate modification of tasks or assist with assess necessary to complete eval    OT Frequency 2x / week    OT Duration 12 weeks    OT Treatment/Interventions Self-care/ADL training;Therapeutic exercise;DME and/or AE instruction;Balance training;Neuromuscular education;Manual Therapy;Visual/perceptual remediation/compensation;Therapeutic activities;Patient/family education    Consulted and Agree with Plan of Care Patient             Patient will benefit from skilled therapeutic intervention in order to improve the following deficits and impairments:   Body Structure / Function / Physical Skills: ADL, Coordination, Endurance, GMC, UE functional use, Balance, Sensation, IADL, Vision, Dexterity, FMC, Strength, Continence, Gait, Mobility       Visit Diagnosis: Muscle weakness (generalized)  Other lack of coordination  Parkinson's disease [facility])    Problem List Patient Active Problem List   Diagnosis Date Noted   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

## 2021-02-04 ENCOUNTER — Ambulatory Visit: Payer: Medicare PPO | Admitting: Speech Pathology

## 2021-02-04 ENCOUNTER — Ambulatory Visit: Payer: Medicare PPO | Attending: Neurology

## 2021-02-04 ENCOUNTER — Other Ambulatory Visit: Payer: Self-pay

## 2021-02-04 ENCOUNTER — Ambulatory Visit: Payer: Medicare PPO

## 2021-02-04 DIAGNOSIS — M6281 Muscle weakness (generalized): Secondary | ICD-10-CM

## 2021-02-04 DIAGNOSIS — R2689 Other abnormalities of gait and mobility: Secondary | ICD-10-CM | POA: Diagnosis present

## 2021-02-04 DIAGNOSIS — R41841 Cognitive communication deficit: Secondary | ICD-10-CM | POA: Diagnosis present

## 2021-02-04 DIAGNOSIS — R471 Dysarthria and anarthria: Secondary | ICD-10-CM | POA: Diagnosis present

## 2021-02-04 DIAGNOSIS — R2681 Unsteadiness on feet: Secondary | ICD-10-CM | POA: Diagnosis present

## 2021-02-04 DIAGNOSIS — G20 Parkinson's disease: Secondary | ICD-10-CM

## 2021-02-04 DIAGNOSIS — R278 Other lack of coordination: Secondary | ICD-10-CM | POA: Insufficient documentation

## 2021-02-04 DIAGNOSIS — G20A1 Parkinson's disease without dyskinesia, without mention of fluctuations: Secondary | ICD-10-CM

## 2021-02-04 NOTE — Therapy (Signed)
[place] MAIN [facility] SERVICES [address] [place], [place], [address] Phone: [phone]   Fax:  [phone]  Occupational Therapy Treatment Reporting period starting 01/05/21-02/04/21  Patient Details  Name: Cory [name] MRN: [id number] Date of Birth: 03/26/1956 No data recorded  Encounter Date: 02/04/2021   OT End of Session - 02/04/21 1444     Visit Number 30    Number of Visits 33    Date for OT Re-Evaluation 03/15/21    Authorization Time Period Reporting period starting 01/05/2021    OT Start Time 1145    OT Stop Time 1228    OT Time Calculation (min) 43 min    Equipment Utilized During Treatment 4 wheeled walker    Activity Tolerance Patient tolerated treatment well    Behavior During Therapy WFL for tasks assessed/performed             Past Medical History:  Diagnosis Date   Arthritis    hips and back   Asthma    as a baby-    GERD (gastroesophageal reflux disease)    rare -    Parkinson's disease ([place])    Pneumonia    age 73   Rash    legs- on going    Past Surgical History:  Procedure Laterality Date   APPENDECTOMY     BACK SURGERY     posterior lumbar spine fusion L4-5   COLONOSCOPY     COLONOSCOPY WITH PROPOFOL N/A 06/06/2017   Procedure: COLONOSCOPY WITH PROPOFOL;  Surgeon: Manya Silvas, MD;  Location: [facility] ENDOSCOPY;  Service: Endoscopy;  Laterality: N/A;   KNEE ARTHROSCOPY Bilateral    ACL   KNEE ARTHROSCOPY W/ ACL RECONSTRUCTION     RADIAL KERATOTOMY     ROTATOR CUFF REPAIR Bilateral    SHOULDER ARTHROSCOPY WITH BICEPS TENDON REPAIR Left 04/16/2019   Procedure: SHOULDER ARTHROSCOPY WITH BICEPS TENDON REPAIR, MINI OPEN SUPERIOR CAPSULAR RECONSTRUCTION, BICEPS TENODESIS;  Surgeon: Leim Fabry, MD;  Location: [facility] ORS;  Service: Orthopedics;  Laterality: Left;    There were no vitals filed for this visit.   Subjective Assessment - 02/04/21 1441     Subjective  "It went fine." (pt referring to  SLP eval prior to OT visit today)    Patient is accompanied by: Family member    Pertinent History Parkinson's Disease, R sided weakness, falls    Limitations balance, strength, coordination, visual perception, problem solving, processing information    Patient Stated Goals "I want to get stronger.  It frustrates me when I have to ask for help with things like cutting my food or buttoning a button."    Currently in Pain? No/denies    Pain Score 0-No pain    Pain Onset More than a month ago            Occupational Therapy Treatment/Progress Note: Therapeutic Exercise: Instructed pt in strengthening exercises for bilat wrist flex/ext/radial ulnar/deviation with 2 lb weight, forearm pron/sup with 4 lb weight, and bicep/tricep strengthening with 4-5 lb weight x2 sets 10 reps each, working to improve strength and activity tolerance for self care tasks.  OT issued handout for above exercises.  Pt required ongoing supv to maintain body mechanics, ensure tolerance for varying dumbbell strengths to avoid strain, and perform exercises with good movement quality, cueing pt to complete all exercises at full range of motion.  Encouraged pt perform exercises at home with external distractions to maintain focus on reps, form, and movement quality.  Pt verbalized understanding.    Response to Treatment: Added to HEP today in preparation for anticipated discharge at end of cert period.  Pt continues to voice concern with his worsening muscle weakness.  OT provided basic AROM exercises for BUEs as noted above with 2-5 lb dumbbells, which pt has at home.  OT continues to provide education on the expectation of physical decline with pt's Parkinson's, while providing education on the benefits of participating in an HEP to help to slow the decline and prevent worsening pain, joint stiffness, and to maximize quality of movement that pt does have.  Handout provided for exercises, though further instruction is needed.  Pt  is working towards improving consistency, with help from spouse, to carry over compensatory ADL strategies, reinforce fall prevention with ADLs, and progress and adapt HEP as physical abilities continue to change as a result of pt's PD.  Spouse and pt report cognitive changes, and pt had SLP evaluation today to assist with developing compensatory strategies to manage these changes.      OT Education - 02/04/21 1442     Education Details HEP progression (dumbbells for bilat elbow, forearm, wrist strengthening)    Person(s) Educated Patient;Spouse    Methods Explanation;Verbal cues;Demonstration    Comprehension Verbalized understanding;Need further instruction;Verbal cues required;Returned demonstration              OT Short Term Goals - 02/04/21 1446       OT SHORT TERM GOAL #1   Title Pt will perform BUE HEP with min vc    Baseline Eval: not yet initiated; 11/19/2020: Pt able to perform with min vc; 12/22/20: indep with cane exercises, need to instruct further in theraband exercises; 02/04/21: added to HEP today for elbow, forearm, wrist strengthening, mod vc with handout (ongoing)    Time 6    Period Weeks    Status On-going    Target Date 03/06/21      OT SHORT TERM GOAL #2   Title Pt will report RPE of 4 or better to indicate improved tolerance to basic ADLs.    Baseline Eval: RPE of 6 with basic ADLs; 11/19/2020: RPE of 5 with basic ADLs; 12/22/20: RPE for ADLs fluctuates greatly from 4 on a good day, to 10 on a bad day; 12/31/20: RPE continues to fluctate greatly throughout the week 4-10; 02/04/2021: significant fluctuations consistent with report on 12/31/20.    Time 6    Period Weeks    Status On-going    Target Date 03/06/21      OT SHORT TERM GOAL #3   Title Pt will utilize visual compensation strategies with min vc in order to improve basic and IADL performance.    Baseline Eval: pt reports some diplopia and decreased depth perception which limits ADL/IADL performance; Pt has  been instructed in diplopia exercises and requires mod vc for compensation strategies when diplopia is present; 12/22/20: pt is indep with visual exercises and knowing when to allow rest breaks or use compensatory strategies, vs request caregiver assist for difficult tasks.    Time 6    Period Weeks    Status Achieved    Target Date 12/22/20               OT Long Term Goals - 02/04/21 1451       OT LONG TERM GOAL #1   Title Pt will increase bilat shoulder strength by 1MM grade in order to improve tolerance to UB ADLs/IADLs and reaching activities.  Baseline Eval: R/L shoulder flex/abd 4-/5, R/L shoulder ER 3+/5; stamina is improving but strength grades still same as eval; R/L shoulder flex/abd 4-, L ER 4-, R ER 3+, L/R IR 4 (pt reports now able to reach easier into overhead kitchen cabinets).    Time 12    Period Weeks    Status On-going    Target Date 03/15/21      OT LONG TERM GOAL #2   Title Pt will manage clothing fasteners and looping belt with modified indep.    Baseline Eval: spouse assists.  Difficulty with tying shoes, looping belt, snapping and buttoning buttons; Using compensatory strategies for shoe tying as pt now has elastic shoe laces.  Pt now managing his own belt with slight extra time.  Increased time to fasten buttons; 12/22/2020: Pt is consistent to loop belt using compensatory strategies, but struggles with buttoning shirts consistently and typically spouse will assist; 02/04/21: remains consistent with looping belt, but extensive time needed for buttons; pt struggles with the motor planning and problem solving needed to manage buttons and spouse typically assists to conserve pt's energy.    Time 12    Period Weeks    Status Partially Met    Target Date 03/15/21      OT LONG TERM GOAL #3   Title Pt will cut food with modified indep.    Baseline Eval: spouse assists to cut food; Indep to cut food; 12/22/2020: Pt now struggles to cut meat; 12/31/20: Pt can cut  chicken with min vc to use large/exaggerated sawing motions but spouse cuts steak; 02/04/21:  Pt cuts meat with intermittent min vc for use of exaggerated sawing motions    Time 12    Period Weeks    Status On-going    Target Date 03/15/21      OT LONG TERM GOAL #4   Title Pt will brush hair with R dominant arm with modified indep.    Baseline Eval: Pt states he must hold R arm up with L hand, and often spouse must assist; 11/19/2020: Pt can reach to top and back of head with RUE but arm fatigues quickly with repetition required to brush hair.    Time 12    Period Weeks    Status Achieved    Target Date 12/22/20      OT LONG TERM GOAL #5   Title Pt will increase FOTO score to 62 or better to indicate measureable functional improvement with ADLs/IADLs.    Baseline Eval: FOTO score of 58; 12/22/2020: 57    Time 12    Period Weeks    Status On-going    Target Date 03/15/21      OT LONG TERM GOAL #6   Title Pt will improve time management skills utilizing external compensatory strategies such as calendars, schedules, alarms as needed with min A.    Baseline 12/22/2020: Not yet initiated (spouse reports that pt is taking longer to complete ADLs yet he is not allowing extra time to complete, causing a rush to get out the door); 02/04/21 Pt has new phone and plans to set up alarms to remind him for medication administration, but has not implemented.    Time 12    Period Weeks    Status On-going    Target Date 03/15/21      OT LONG TERM GOAL #7   Title Pt will improve dynamic standing balance to enable pt to feed dogs with modified indep.    Baseline 12/22/2020:  Several falls over the last week; pt is inconsistent to use his rollator for dynamic standing tasks.  Would need supv to feed dogs; 02/04/21: pt can feed dogs with modified indep using large scoopers and pitcher of water to pour into dishes on the floor rather than carry dishes full of water or food.    Time 12    Period Weeks    Status  Achieved    Target Date 03/15/21               Plan - 02/04/21 1516     Clinical Impression Statement Added to HEP today in preparation for anticipated discharge at end of cert period.  Pt continues to voice concern with his worsening muscle weakness.  OT provided basic AROM exercises for BUEs as noted above with 2-5 lb dumbbells, which pt has at home.  OT continues to provide education on the expectation of physical decline with pt's Parkinson's, while providing education on the benefits of participating in an HEP to help to slow the decline and prevent worsening pain, joint stiffness, and to maximize quality of movement that pt does have.  Handout provided for exercises, though further instruction is needed.  Pt is working towards improving consistency, with help from spouse, to carry over compensatory ADL strategies, reinforce fall prevention with ADLs, and progress and adapt HEP as physical abilities continue to change as a result of pt's PD.  Spouse and pt report cognitive changes, and pt had SLP evaluation today to assist with developing compensatory strategies to manage these changes.    OT Occupational Profile and History Detailed Assessment- Review of Records and additional review of physical, cognitive, psychosocial history related to current functional performance    Occupational performance deficits (Please refer to evaluation for details): ADL's;IADL's;Leisure    Body Structure / Function / Physical Skills ADL;Coordination;Endurance;GMC;UE functional use;Balance;Sensation;IADL;Vision;Dexterity;FMC;Strength;Continence;Gait;Mobility    Rehab Potential Good    Clinical Decision Making Several treatment options, min-mod task modification necessary    Comorbidities Affecting Occupational Performance: May have comorbidities impacting occupational performance    Modification or Assistance to Complete Evaluation  Min-Moderate modification of tasks or assist with assess necessary to complete  eval    OT Frequency 2x / week    OT Duration 12 weeks    OT Treatment/Interventions Self-care/ADL training;Therapeutic exercise;DME and/or AE instruction;Balance training;Neuromuscular education;Manual Therapy;Visual/perceptual remediation/compensation;Therapeutic activities;Patient/family education    Consulted and Agree with Plan of Care Patient             Patient will benefit from skilled therapeutic intervention in order to improve the following deficits and impairments:   Body Structure / Function / Physical Skills: ADL, Coordination, Endurance, GMC, UE functional use, Balance, Sensation, IADL, Vision, Dexterity, FMC, Strength, Continence, Gait, Mobility       Visit Diagnosis: Muscle weakness (generalized)  Other lack of coordination  Parkinson's disease [facility])    Problem List Patient Active Problem List   Diagnosis Date Noted   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 [place], [place], [address] Phone: [phone]   Fax:  [phone]  Name: Cory [name] MRN: [id number] Date of Birth: 1955-04-08

## 2021-02-04 NOTE — Therapy (Signed)
[place] MAIN [facility] SERVICES [address] [place], [place], [address] Phone: [phone]   Fax:  [phone]  Physical Therapy Treatment  Patient Details  Name: Cory [name] MRN: [id number] Date of Birth: [birth year] Referring Provider (PT): Benay Spice   Encounter Date: 02/04/2021   PT End of Session - 02/04/21 1102     Visit Number 36    Number of Visits 45    Date for PT Re-Evaluation 03/04/21    Authorization Type [employer] PPO    Authorization Time Period Cert 09/11/60-95/28/41    Progress Note Due on Visit 30    PT Start Time 1105    PT Stop Time 1146    PT Time Calculation (min) 41 min    Equipment Utilized During Treatment Gait belt    Activity Tolerance Patient tolerated treatment well    Behavior During Therapy WFL for tasks assessed/performed             Past Medical History:  Diagnosis Date   Arthritis    hips and back   Asthma    as a baby-    GERD (gastroesophageal reflux disease)    rare -    Parkinson's disease ([place])    Pneumonia    age 69   Rash    legs- on going    Past Surgical History:  Procedure Laterality Date   APPENDECTOMY     BACK SURGERY     posterior lumbar spine fusion L4-5   COLONOSCOPY     COLONOSCOPY WITH PROPOFOL N/A 06/06/2017   Procedure: COLONOSCOPY WITH PROPOFOL;  Surgeon: Manya Silvas, MD;  Location: [facility] ENDOSCOPY;  Service: Endoscopy;  Laterality: N/A;   KNEE ARTHROSCOPY Bilateral    ACL   KNEE ARTHROSCOPY W/ ACL RECONSTRUCTION     RADIAL KERATOTOMY     ROTATOR CUFF REPAIR Bilateral    SHOULDER ARTHROSCOPY WITH BICEPS TENDON REPAIR Left 04/16/2019   Procedure: SHOULDER ARTHROSCOPY WITH BICEPS TENDON REPAIR, MINI OPEN SUPERIOR CAPSULAR RECONSTRUCTION, BICEPS TENODESIS;  Surgeon: Leim Fabry, MD;  Location: [facility] ORS;  Service: Orthopedics;  Laterality: Left;    There were no vitals filed for this visit.   Subjective Assessment - 02/04/21 1100      Subjective Patient reports feeling a little down after Speech eval. Denies feeling fatigue this AM, denies falls or LOB since last session.    Patient is accompained by: Family member    Pertinent History Patient admitted to hospital 05/23/20 with code stroke and R sided weakness. Imaging negative for acute changes. PMH includes Parkinson's with autonomic instability, asthma, GERD depression, degenerative spondylolisthesis, focal seizures and migraine syndrome. Patient had three ED visits for falls since then. Patient reports when he has the seizures he has pain in his temples and bright flashes in his vision. Last week had three falls in one day.    Limitations Lifting;Standing;Walking;House hold activities    How long can you sit comfortably? sleeps in easy chair.    How long can you stand comfortably? feel unsteady once in standing.    How long can you walk comfortably? difficult to walk during "off period"    Patient Stated Goals stability, have a device that will be most helpful, help to become and stay independent.    Currently in Pain? No/denies    Pain Score 0-No pain             TherEx:  Nu Step x5 min, level 4, Rpm >60 [place]  press on cable machine, 2x10-15 each side, 2.5lbs, close CGA. RTB Seated Flys 1x10, discontinued 2/2 pt complaints of shoulder pain Resisted Walking backwards and forwards with rope attachment on cable machine with 15#, cuing for wide BOS and slow, controlled steps. Close CGA. Back/forth x8   Neuro Re-Ed:   Basketball; bending over to pick up balls from basket on floor. 3 min x right side, 3 min x left side  Standing toe taps to 6in step, working on wide stance and motor control, no UE support. 3x10 each side.  Seated: Marches on dynadisc 2x10 each leg, cuing for upright posture.  Thoracic rotation; arms extended rotating to touch opposite shoe and returning to good upright center between each rep. x10 each side    Pt educated throughout session  about proper posture and technique with exercises. Improved exercise technique, movement at target joints, use of target muscles after min to mod verbal, visual, tactile cues.    Patient gave good effort during exercise progressions today despite reporting to PT in low spirits, post SLT eval. Patient demonstrated improved self correction with stutter steps and widened BOS, however increased cuing was needed as patient fatigued. Will continue to benefit from skilled PT to improve gait mechanics, strength, and independence with ADLs to improve safety and quality of life.            PT Education - 02/04/21 1207     Education Details exercise technique, body mechanics    Person(s) Educated Patient    Methods Explanation;Demonstration;Tactile cues;Verbal cues    Comprehension Returned demonstration;Verbal cues required;Verbalized understanding;Tactile cues required              PT Short Term Goals - 10/21/20 0903       PT SHORT TERM GOAL #1   Title Patient will be independent in home exercise program to improve strength/mobility for better functional independence with ADLs.    Baseline 6/16: HEP to be given next session 7/19: HEP compliant    Time 4    Period Weeks    Status Achieved    Target Date 10/16/20               PT Long Term Goals - 01/16/21 1018       PT LONG TERM GOAL #1   Title Patient will increase FOTO score to equal to or greater than 51%    to demonstrate statistically significant improvement in mobility and quality of life.    Baseline 6/16: 48% 7/19: 57% 8/31: 50%    Time 12    Period Weeks    Status Achieved      PT LONG TERM GOAL #2   Title Patient will increase Berg Balance score by > 6 points (34/56)  to demonstrate decreased fall risk during functional activities.    Baseline 6/16: 28/56 7/19: 45/56    Time 12    Period Weeks    Status Achieved      PT LONG TERM GOAL #3   Title Patient will deny any falls over past 4 weeks to  demonstrate improved safety awareness at home and work.    Baseline 6/16: multiple falls 7/19: no falls reported 9/7: 3 falls in past month; 2 falls in past month    Time 12    Period Weeks    Status On-going    Target Date 03/04/21      PT LONG TERM GOAL #4   Title Patient will increase six minute walk test distance to >1300  for progression to age norm community ambulator and improve gait ability    Baseline 6/16: 945 ft with multiple near falls 7/19: 1495 ft    Time 12    Period Weeks    Status Achieved      PT LONG TERM GOAL #5   Title Patient will increase Berg Balance score by > 6 points (51/56)  to demonstrate decreased fall risk during functional activities.    Baseline 7/19: 45/56 8/31: 49/56; 10/14: 52/56    Time 12    Period Weeks    Status Partially Met    Target Date 03/04/21      PT LONG TERM GOAL #6   Title Patient will increase Functional Gait Assessment score to >20/30 as to reduce fall risk and improve dynamic gait safety with community ambulation.    Baseline 7/19: 16/30 8/31: 20/30;    Time 12    Period Weeks    Status Achieved      PT LONG TERM GOAL #7   Title Patient will increase Functional Gait Assessment score to >26/30 as to reduce fall risk and improve dynamic gait safety with community ambulation.    Baseline 8/31: 20/30; 10/14: 22/30    Time 12    Period Weeks    Status On-going    Target Date 01/16/21      PT LONG TERM GOAL #8   Title Patient will static stand for approximately 5 minutes and perturbations for shooting/teaching classes without loss of balance or requiring object to lean on.    Baseline Unable to perform without leaning on objects; 10/14: Still unable to perform without leaning on objects.    Time 12    Period Weeks    Status On-going    Target Date 03/04/21      PT LONG TERM GOAL  #9   TITLE Patient will be able to ambulate approximately 1 mile walking dog across changing surfaces such as grass, concrete, and sidewalk without  loss of balance.    Baseline 9/7: unable to perform yet; 10/14: Unable    Time 12    Period Weeks    Status On-going    Target Date 03/04/21      PT LONG TERM GOAL  #10   TITLE Patient will bend down and pick up items without losing balance to perform iADLs such as emptying dishwasher and feeding dog.    Baseline 9/7: unable to perform; 10/14: Performing at home without falls or LOB.    Time 12    Period Weeks    Status Achieved    Target Date 03/04/21                   Plan - 02/04/21 1102     Clinical Impression Statement Patient gave good effort during exercise progressions today despite reporting to PT in low spirits, post SLT eval. Patient demonstrated improved self correction with stutter steps and widened BOS, however increased cuing was needed as patient fatigued. Will continue to benefit from skilled PT to improve gait mechanics, strength, and independence with ADLs to improve safety and quality of life.    Personal Factors and Comorbidities Age;Comorbidity 3+;Fitness;Past/Current Experience;Time since onset of injury/illness/exacerbation;Transportation    Comorbidities Parkinson's with autonomic instability, asthma, GERD depression, degenerative spondylolisthesis, focal seizures and migraine syndrome    Examination-Activity Limitations Bathing;Bed Mobility;Bend;Caring for [employer];Locomotion Level;Lift;Hygiene/Grooming;Squat;Stairs;Stand;Toileting;Transfers    Examination-Participation Restrictions Church;Cleaning;Community Activity;Driving;Meal Prep;Medication Management;Laundry;Shop;Volunteer;Yard Work    Merchant navy officer Evolving/Moderate complexity  Rehab Potential Fair    PT Frequency 2x / week    PT Duration 12 weeks    PT Treatment/Interventions ADLs/Self Care Home Management;Biofeedback;Canalith Repostioning;Cryotherapy;Parrafin;Ultrasound;Traction;Moist Heat;Iontophoresis 78m/ml Dexamethasone;Electrical  Stimulation;DME Instruction;Gait training;Stair training;Functional mobility training;Neuromuscular re-education;Balance training;Therapeutic exercise;Therapeutic activities;Patient/family education;Orthotic Fit/Training;Manual techniques;Passive range of motion;Manual lymph drainage;Energy conservation;Splinting;Taping;Vasopneumatic Device;Vestibular;Joint Manipulations;Visual/perceptual remediation/compensation    PT Next Visit Plan Increase cognitive demand/dual tasking during gait, progress thoracic opening and rotaiton exercises, core strengthening, progress dynamic balance and gait training for increased BOS and dual tasking. Add core strengthening to HEP. Add dynadisc tasks with backwards rotational component.    PT Home Exercise Plan No updates today    Consulted and Agree with Plan of Care Patient    Family Member Consulted wife             Patient will benefit from skilled therapeutic intervention in order to improve the following deficits and impairments:  Abnormal gait, Decreased activity tolerance, Decreased balance, Decreased knowledge of precautions, Decreased endurance, Decreased coordination, Decreased cognition, Decreased knowledge of use of DME, Decreased mobility, Decreased safety awareness, Difficulty walking, Decreased strength, Impaired flexibility, Impaired perceived functional ability, Impaired sensation, Impaired tone, Impaired UE functional use, Impaired vision/preception, Improper body mechanics, Postural dysfunction  Visit Diagnosis: Muscle weakness (generalized)  Other lack of coordination  Other abnormalities of gait and mobility     Problem List Patient Active Problem List   Diagnosis Date Noted   Focal seizure ([place] 05/24/2020   Parkinson's disease ([place]    History of migraine headaches    Migraine syndrome    BPH (benign prostatic hyperplasia)    Depression    Degenerative spondylolisthesis 05/07/2016   MArsenio Katz SPT  This entire session  was performed under direct supervision and direction of a licensed therapist/therapist assistant . I have personally read, edited and approve of the note as written.  MJanna Arch PT, DPT  02/04/2021, 2:31 PM  [place] [facility] [address] [address]Phone: [phone]  Fax:  [phone] Name: TZYERE JIMINEZMRN: [id number] of Birth: [birth year]

## 2021-02-04 NOTE — Therapy (Signed)
[place] MAIN [facility] SERVICES [address] [place], [place], [address] Phone: [phone]   Fax:  [phone]  Speech Language Pathology Evaluation  Patient Details  Name: Cory [name] MRN: [id number] Date of Birth: 1955-06-03 Referring Provider (SLP): Ramonita Lab III, MD   Encounter Date: 02/04/2021   End of Session - 02/04/21 1207     Visit Number 1    Number of Visits 9    Date for SLP Re-Evaluation 05/05/21    Authorization Type [employer]    SLP Start Time 1000    SLP Stop Time  51    SLP Time Calculation (min) 65 min    Activity Tolerance Patient tolerated treatment well             Past Medical History:  Diagnosis Date   Arthritis    hips and back   Asthma    as a baby-    GERD (gastroesophageal reflux disease)    rare -    Parkinson's disease ([place])    Pneumonia    age 23   Rash    legs- on going    Past Surgical History:  Procedure Laterality Date   APPENDECTOMY     BACK SURGERY     posterior lumbar spine fusion L4-5   COLONOSCOPY     COLONOSCOPY WITH PROPOFOL N/A 06/06/2017   Procedure: COLONOSCOPY WITH PROPOFOL;  Surgeon: Manya Silvas, MD;  Location: [facility] ENDOSCOPY;  Service: Endoscopy;  Laterality: N/A;   KNEE ARTHROSCOPY Bilateral    ACL   KNEE ARTHROSCOPY W/ ACL RECONSTRUCTION     RADIAL KERATOTOMY     ROTATOR CUFF REPAIR Bilateral    SHOULDER ARTHROSCOPY WITH BICEPS TENDON REPAIR Left 04/16/2019   Procedure: SHOULDER ARTHROSCOPY WITH BICEPS TENDON REPAIR, MINI OPEN SUPERIOR CAPSULAR RECONSTRUCTION, BICEPS TENODESIS;  Surgeon: Leim Fabry, MD;  Location: [facility] ORS;  Service: Orthopedics;  Laterality: Left;    There were no vitals filed for this visit.   Subjective Assessment - 02/04/21 1142     Subjective "I can't problem solve like I used to."    Patient is accompained by: --   Fabio Pierce   Currently in Pain? No/denies                SLP Evaluation [facility] - 02/04/21 1142       SLP  Visit Information   SLP Received On 02/04/21    Referring Provider (SLP) Ramonita Lab III, MD    Onset Date 01/29/21   referral date. PD dx 2011   Medical Diagnosis Parkinson's Disease      Subjective   Patient/Family Stated Goal compensate for cognitive deficits      General Information   HPI Rollyn Scialdone is a 65 y.o. male with past medical history of Parkinson's disease (dx 2011), asthma, GERD, depression, focal seizures and migraine syndrome, falls. Currently seeing PT/OT. Pt referred for pt/caregiver training in compensatory strategies to manage cognitive decline related to PD.    Behavioral/Cognition alert, cooperative, tangential    Mobility Status ambulated with rollator and assistance/cues from spouse      Balance Screen   Has the patient fallen in the past 6 months Yes   currently on PT caseload     Prior Functional Status   Cognitive/Linguistic Baseline Information not available    Type of Home House     Lives With Spouse    Available Support Family    Vocation Retired  Cognition   Overall Cognitive Status Impaired/Different from baseline   hx cognitive decline, not formally assessed previously   Area of Impairment Attention;Memory;Following commands;Safety/judgement;Awareness;Problem solving    Attention Selective    Selective Attention Impaired    Selective Attention Impairment Verbal basic;Functional basic   distracted by tv when walking, internal distractions during conversation   Memory Impaired    Memory Impairment Prospective memory;Decreased short term memory    Decreased Short Term Memory Functional basic;Verbal basic   story retell~50% of details, forgets to take medications   Awareness Impaired    Awareness Impairment Emergent impairment;Intellectual impairment   able to verbalize some deficits, however has reduced error awareness in some tasks (symbol cancellation, design memory,   Problem Solving Impaired    Problem Solving Impairment Functional basic    difficulty problem solving how to put clothing on without assistance, how to operate a sound system he installed 5 years ago   Executive Function Sequencing;Organizing;Reasoning   difficulty with time management, sequencing steps to some tasks     Auditory Comprehension   Overall Auditory Comprehension Appears within functional limits for tasks assessed   responses are at times off-topic, feel this is attn vs language   Conversation Simple   -mod complex   Interfering Components Attention;Processing speed;Working Tourist information centre manager Not tested      Reading Comprehension   Reading Status Not tested      Expression   Primary Mode of Expression Verbal      Verbal Expression   Overall Verbal Expression Appears within functional limits for tasks assessed    Pragmatics Impairment    Impairments Topic maintenance    Interfering Components Attention      Written Expression   Dominant Hand Right    Written Expression Not tested      Oral Motor/Sensory Function   Labial ROM Reduced left;Reduced right    Labial Symmetry Within Functional Limits      Motor Speech   Overall Motor Speech Impaired    Respiration Impaired    Level of Impairment Conversation   short rushes of speech   Phonation Low vocal intensity   mild hypophonia   Resonance Within functional limits    Articulation Impaired    Level of Impairment Conversation   stutters occasionally   Intelligibility Intelligible   repetition needed x1 in 60 minutes     Standardized Assessments   Standardized Assessments  Cognitive Linguistic Quick Test               Cognitive Linguistic Quick Test: AGE - 18 - 69   The Cognitive Linguistic Quick Test (CLQT) was administered to assess the relative status of five cognitive domains: attention, memory, language, executive functioning, and visuospatial skills. Scores from 10  tasks were used to estimate severity ratings (standardized for age groups 18-69 years and 70-89 years) for each domain, a clock drawing task, as well as an overall composite severity rating of cognition.       Task Score Criterion Cut Scores  Personal Facts 8/8 8  Symbol Cancellation 11/12 11  Confrontation Naming 10/10 10  Clock Drawing  9/13 12  Story Retelling 7/10 6  Symbol Trails 2/10 9  Generative Naming 5/9 5  Design Memory 4/6 5  Mazes  7/8 7  Design Generation 7/13 6    Cognitive Domain Composite Score Severity Rating  Attention 162/215 Mild  Memory 143/185 Mild  Executive Function 21/40 Mild  Language 30/37 WNL  Visuospatial Skills 70/105 Mild  Clock Drawing  9/13 Moderate  Composite Severity Rating 3.2 Mild       SLP Education - 02/04/21 1207     Education Details deficit areas; ST to focus on compensations and education    Person(s) Educated Patient;Spouse    Methods Explanation    Comprehension Verbalized understanding;Need further instruction                SLP Long Term Goals - 02/04/21 [place] #1   Title Pt will use daily schedule to prepare in advance for scheduled activities/outings with min caregiver cues.    Time 4    Period Weeks    Status New    Target Date 05/05/21      SLP LONG TERM GOAL #2   Title Pt/spouse will report no missed medications over 1 week period using medication management system/strategies.    Time 4    Period Weeks    Status New    Target Date 05/05/21      SLP LONG TERM GOAL #3   Title Pt/spouse will report using strategies to maximize communication effectiveness (reduce distractions, face-to-face conversation, chunking of information, repeat back information, etc).    Time 4    Period Weeks    Status New    Target Date 05/05/21              Plan - 02/04/21 1209     Clinical Impression Statement Kimmy Parish presents with cognitive communication deficits in attention, memory,  executive functioning and visuospatial skills. Standardized assessment via Cognitive Linguistic Quick Test places these deficits in mild range of impairment, however functionally feel pt's deficits are more moderate in nature. Patient able to verbalize some impairments, however has decreased insight into implications of deficits. Per spouse, pt misses medications or takes them late when she is not present to give them, and is having difficulty with time management. Pt states that when he expects something to take 15 minutes, it will take him an hour or more (with assistance). Noted at times pt did not appear to process portions of conversation and instructions, and wife agreed this is a problem at home; suspect rooted in decreased attention vs language. Pt and spouse appear open and willing to implement compensatory strategies to improve daily function. I recommend brief course of skilled ST to target use of strategies and compensations in specific functional tasks to maximize pt independence, improve safety, minimize frustration and reduce caregiver burden.    Speech Therapy Frequency 2x / week    Duration 4 weeks    Treatment/Interventions Environmental controls;Cueing hierarchy;SLP instruction and feedback;Compensatory strategies;Functional tasks;Internal/external aids;Patient/family education    Potential to Achieve Goals Fair    Potential Considerations Ability to learn/carryover information;Severity of impairments;Previous level of function    SLP Home Exercise Plan to be provided next session    Consulted and Agree with Plan of Care Patient;Family member/caregiver    Family Member Consulted Wife             Patient will benefit from skilled therapeutic intervention in order to improve the following deficits and impairments:   Cognitive communication deficit  Dysarthria and anarthria  Parkinson's disease [facility])    Problem List Patient Active Problem List   Diagnosis Date Noted    Focal seizure ([place]) 05/24/2020   Parkinson's disease ([place])    History of migraine headaches  Migraine syndrome    BPH (benign prostatic hyperplasia)    Depression    Degenerative spondylolisthesis 05/07/2016   Deneise Lever, [place], [place] 02/04/2021, 12:27 PM  [place] MAIN [facility] SERVICES [address] [place], [place], [address] Phone: [phone]   Fax:  [phone]  Name: ALVEN ALVERIO MRN: [id number] Date of Birth: 12-02-1955

## 2021-02-09 ENCOUNTER — Ambulatory Visit: Payer: Medicare PPO

## 2021-02-09 ENCOUNTER — Other Ambulatory Visit: Payer: Self-pay

## 2021-02-09 DIAGNOSIS — M6281 Muscle weakness (generalized): Secondary | ICD-10-CM

## 2021-02-09 DIAGNOSIS — R278 Other lack of coordination: Secondary | ICD-10-CM

## 2021-02-09 DIAGNOSIS — G20 Parkinson's disease: Secondary | ICD-10-CM

## 2021-02-09 DIAGNOSIS — G20A1 Parkinson's disease without dyskinesia, without mention of fluctuations: Secondary | ICD-10-CM

## 2021-02-09 DIAGNOSIS — R2689 Other abnormalities of gait and mobility: Secondary | ICD-10-CM

## 2021-02-09 NOTE — Therapy (Signed)
[place] MAIN [facility] SERVICES [address] [place], [place], [address] Phone: [phone]   Fax:  [phone]  Physical Therapy Treatment  Patient Details  Name: Cory [name] MRN: [id number] Date of Birth: 1955/09/21 Referring Provider (PT): Benay Spice   Encounter Date: 02/09/2021   PT End of Session - 02/09/21 1057     Visit Number 39    Number of Visits 41    Date for PT Re-Evaluation 03/04/21    Authorization Type [employer] PPO    Authorization Time Period Cert 05/12/31-35/45/62    PT Start Time 1101    PT Stop Time 1144    PT Time Calculation (min) 43 min    Equipment Utilized During Treatment Gait belt    Activity Tolerance Patient tolerated treatment well    Behavior During Therapy WFL for tasks assessed/performed             Past Medical History:  Diagnosis Date   Arthritis    hips and back   Asthma    as a baby-    GERD (gastroesophageal reflux disease)    rare -    Parkinson's disease ([place])    Pneumonia    age 63   Rash    legs- on going    Past Surgical History:  Procedure Laterality Date   APPENDECTOMY     BACK SURGERY     posterior lumbar spine fusion L4-5   COLONOSCOPY     COLONOSCOPY WITH PROPOFOL N/A 06/06/2017   Procedure: COLONOSCOPY WITH PROPOFOL;  Surgeon: Manya Silvas, MD;  Location: [facility] ENDOSCOPY;  Service: Endoscopy;  Laterality: N/A;   KNEE ARTHROSCOPY Bilateral    ACL   KNEE ARTHROSCOPY W/ ACL RECONSTRUCTION     RADIAL KERATOTOMY     ROTATOR CUFF REPAIR Bilateral    SHOULDER ARTHROSCOPY WITH BICEPS TENDON REPAIR Left 04/16/2019   Procedure: SHOULDER ARTHROSCOPY WITH BICEPS TENDON REPAIR, MINI OPEN SUPERIOR CAPSULAR RECONSTRUCTION, BICEPS TENODESIS;  Surgeon: Leim Fabry, MD;  Location: [facility] ORS;  Service: Orthopedics;  Laterality: Left;    There were no vitals filed for this visit.   Subjective Assessment - 02/09/21 1109     Subjective Patient had to take aleve for  headaches over the weekend. Has had a very bad weekend. Sunday was the worst day he has had in a long period of time. Has not had a fall hitting his head but had falls.    Patient is accompained by: Family member    Pertinent History Patient admitted to hospital 05/23/20 with code stroke and R sided weakness. Imaging negative for acute changes. PMH includes Parkinson's with autonomic instability, asthma, GERD depression, degenerative spondylolisthesis, focal seizures and migraine syndrome. Patient had three ED visits for falls since then. Patient reports when he has the seizures he has pain in his temples and bright flashes in his vision. Last week had three falls in one day.    Limitations Lifting;Standing;Walking;House hold activities    How long can you sit comfortably? sleeps in easy chair.    How long can you stand comfortably? feel unsteady once in standing.    How long can you walk comfortably? difficult to walk during "off period"    Patient Stated Goals stability, have a device that will be most helpful, help to become and stay independent.    Currently in Pain? No/denies                Patient had to take  aleve for headaches over the weekend. Has had a very bad weekend. Sunday was the worst day he has had in a long period of time. Has not had a fall hitting his head but had falls.   BP at start of session:  seated 155/90  Standing: 135/79 Standing 2 minutes: 127/85   TherEx: Three part lunge: BUE : high knee to 90 degree, large lateral step, large lateral lunge, back up to 90 degrees then stomp down; 10x each LE    STS slow and controlled 5 count eccentric and concentric, x10  Seated: Upper trap stretch two way 2x 30 seconds each direction GTB around bilateral feet: march with abduction 12x each LE   Neuro Re-Ed: Forward step over half foam roller and back: heel toe  taps 12x each LE, BUE support Airex pad: static stand posture correction x 60 seconds    Seated: Sitting on dynadisc: -alternate UE/LE raises 12x each; cues for slowing down, use of mirror for midline -alternating bicycle 15x each LE; very challenging     Access Code: [email or web address] URL: [email or web address] Date: 02/09/2021 Prepared by: Janna Arch  Exercises Seated Cervical Sidebending Stretch - 1 x daily - 7 x weekly - 2 sets - 2 reps - 30 hold Seated Levator Scapulae Stretch - 1 x daily - 7 x weekly - 2 sets - 2 reps - 30 hold   Pt educated throughout session about proper posture and technique with exercises. Improved exercise technique, movement at target joints, use of target muscles after min to mod verbal, visual, tactile cues.     Patient presents with increased tremors and instability this session requiring a reduction of intensity of interventions. Blood pressure was monitored and patient was found to be orthostatic. Patient and wife agreeable to monitor at home. Patient educated on slow 5 deep breaths upon transition to standing to decrease episodes of instability. Patient will continue to benefit from skilled PT to address core strengthening, postural exercises, balance, and functional mobility to increase safety and independence with ADLs                  PT Education - 02/09/21 1056     Education Details exercise technique, body mechanics    Person(s) Educated Patient    Methods Explanation;Demonstration;Tactile cues;Verbal cues    Comprehension Verbalized understanding;Returned demonstration;Verbal cues required;Tactile cues required              PT Short Term Goals - 10/21/20 0903       PT SHORT TERM GOAL #1   Title Patient will be independent in home exercise program to improve strength/mobility for better functional independence with ADLs.    Baseline 6/16: HEP to be given next session 7/19: HEP compliant    Time 4    Period Weeks    Status Achieved    Target Date 10/16/20               PT Long Term  Goals - 01/16/21 1018       PT LONG TERM GOAL #1   Title Patient will increase FOTO score to equal to or greater than 51%    to demonstrate statistically significant improvement in mobility and quality of life.    Baseline 6/16: 48% 7/19: 57% 8/31: 50%    Time 12    Period Weeks    Status Achieved      PT LONG TERM GOAL #2   Title Patient will increase Berg Balance score by > 6 points (  34/56)  to demonstrate decreased fall risk during functional activities.    Baseline 6/16: 28/56 7/19: 45/56    Time 12    Period Weeks    Status Achieved      PT LONG TERM GOAL #3   Title Patient will deny any falls over past 4 weeks to demonstrate improved safety awareness at home and work.    Baseline 6/16: multiple falls 7/19: no falls reported 9/7: 3 falls in past month; 2 falls in past month    Time 12    Period Weeks    Status On-going    Target Date 03/04/21      PT LONG TERM GOAL #4   Title Patient will increase six minute walk test distance to >1300 for progression to age norm community ambulator and improve gait ability    Baseline 6/16: 945 ft with multiple near falls 7/19: 1495 ft    Time 12    Period Weeks    Status Achieved      PT LONG TERM GOAL #5   Title Patient will increase Berg Balance score by > 6 points (51/56)  to demonstrate decreased fall risk during functional activities.    Baseline 7/19: 45/56 8/31: 49/56; 10/14: 52/56    Time 12    Period Weeks    Status Partially Met    Target Date 03/04/21      PT LONG TERM GOAL #6   Title Patient will increase Functional Gait Assessment score to >20/30 as to reduce fall risk and improve dynamic gait safety with community ambulation.    Baseline 7/19: 16/30 8/31: 20/30;    Time 12    Period Weeks    Status Achieved      PT LONG TERM GOAL #7   Title Patient will increase Functional Gait Assessment score to >26/30 as to reduce fall risk and improve dynamic gait safety with community ambulation.    Baseline 8/31: 20/30;  10/14: 22/30    Time 12    Period Weeks    Status On-going    Target Date 01/16/21      PT LONG TERM GOAL #8   Title Patient will static stand for approximately 5 minutes and perturbations for shooting/teaching classes without loss of balance or requiring object to lean on.    Baseline Unable to perform without leaning on objects; 10/14: Still unable to perform without leaning on objects.    Time 12    Period Weeks    Status On-going    Target Date 03/04/21      PT LONG TERM GOAL  #9   TITLE Patient will be able to ambulate approximately 1 mile walking dog across changing surfaces such as grass, concrete, and sidewalk without loss of balance.    Baseline 9/7: unable to perform yet; 10/14: Unable    Time 12    Period Weeks    Status On-going    Target Date 03/04/21      PT LONG TERM GOAL  #10   TITLE Patient will bend down and pick up items without losing balance to perform iADLs such as emptying dishwasher and feeding dog.    Baseline 9/7: unable to perform; 10/14: Performing at home without falls or LOB.    Time 12    Period Weeks    Status Achieved    Target Date 03/04/21                   Plan - 02/09/21 1241  Clinical Impression Statement Patient presents with increased tremors and instability this session requiring a reduction of intensity of interventions. Blood pressure was monitored and patient was found to be orthostatic. Patient and wife agreeable to monitor at home. Patient educated on slow 5 deep breaths upon transition to standing to decrease episodes of instability. Patient will continue to benefit from skilled PT to address core strengthening, postural exercises, balance, and functional mobility to increase safety and independence with ADLs    Personal Factors and Comorbidities Age;Comorbidity 3+;Fitness;Past/Current Experience;Time since onset of injury/illness/exacerbation;Transportation    Comorbidities Parkinson's with autonomic instability, asthma,  GERD depression, degenerative spondylolisthesis, focal seizures and migraine syndrome    Examination-Activity Limitations Bathing;Bed Mobility;Bend;Caring for [employer];Locomotion Level;Lift;Hygiene/Grooming;Squat;Stairs;Stand;Toileting;Transfers    Examination-Participation Restrictions Church;Cleaning;Community Activity;Driving;Meal Prep;Medication Management;Laundry;Shop;Volunteer;Yard Work    [employer]chant [employer] officer Evolving/Moderate complexity    Rehab Potential Fair    PT Frequency 2x / week    PT Duration 12 weeks    PT Treatment/Interventions ADLs/Self Care Home Management;Biofeedback;Canalith Repostioning;Cryotherapy;Parrafin;Ultrasound;Traction;Moist Heat;Iontophoresis 1m/ml Dexamethasone;Electrical Stimulation;DME Instruction;Gait training;Stair training;Functional mobility training;Neuromuscular re-education;Balance training;Therapeutic exercise;Therapeutic activities;Patient/family education;Orthotic Fit/Training;Manual techniques;Passive range of motion;Manual lymph drainage;Energy conservation;Splinting;Taping;Vasopneumatic Device;Vestibular;Joint Manipulations;Visual/perceptual remediation/compensation    PT Next Visit Plan Increase cognitive demand/dual tasking during gait, progress thoracic opening and rotaiton exercises, core strengthening, progress dynamic balance and gait training for increased BOS and dual tasking. Add core strengthening to HEP. Add dynadisc tasks with backwards rotational component.    PT Home Exercise Plan No updates today    Consulted and Agree with Plan of Care Patient    Family Member Consulted wife             Patient will benefit from skilled therapeutic intervention in order to improve the following deficits and impairments:  Abnormal gait, Decreased activity tolerance, Decreased balance, Decreased knowledge of precautions, Decreased endurance, Decreased coordination, Decreased cognition, Decreased knowledge of  use of DME, Decreased mobility, Decreased safety awareness, Difficulty walking, Decreased strength, Impaired flexibility, Impaired perceived functional ability, Impaired sensation, Impaired tone, Impaired UE functional use, Impaired vision/preception, Improper body mechanics, Postural dysfunction  Visit Diagnosis: Parkinson's disease ([place]  Muscle weakness (generalized)  Other abnormalities of gait and mobility     Problem List Patient Active Problem List   Diagnosis Date Noted   [place] 05/[place]   [place]'s disease [place]    History of migraine headaches    Migraine syndrome    BPH (benign prostatic hyperplasia)    Depression    Degenerative spondylolisthesis 05/07/2016    MJanna Arch PT, DPT  02/09/2021, 12:45 PM  [facility] [address] [address]Phone: [phone]  Fax:  [phone] Name: TSTACIE KNUTZENMRN: [id number] of Birth: [birth year]

## 2021-02-09 NOTE — Therapy (Signed)
[place] MAIN [facility] SERVICES [address] [place], [place], [address] Phone: [phone]   Fax:  [phone]  Occupational Therapy Treatment  Patient Details  Name: Cory [name] MRN: [id number] Date of Birth: 1955-04-11 No data recorded  Encounter Date: 02/09/2021   OT End of Session - 02/09/21 1144     Visit Number 31    Number of Visits 79    Date for OT Re-Evaluation 03/15/21    Authorization Time Period Reporting period starting 01/05/2021    OT Start Time 1145    OT Stop Time 64    OT Time Calculation (min) 45 min    Activity Tolerance Patient tolerated treatment well    Behavior During Therapy WFL for tasks assessed/performed             Past Medical History:  Diagnosis Date   Arthritis    hips and back   Asthma    as a baby-    GERD (gastroesophageal reflux disease)    rare -    Parkinson's disease ([place])    Pneumonia    age 23   Rash    legs- on going    Past Surgical History:  Procedure Laterality Date   APPENDECTOMY     BACK SURGERY     posterior lumbar spine fusion L4-5   COLONOSCOPY     COLONOSCOPY WITH PROPOFOL N/A 06/06/2017   Procedure: COLONOSCOPY WITH PROPOFOL;  Surgeon: Manya Silvas, MD;  Location: [facility] ENDOSCOPY;  Service: Endoscopy;  Laterality: N/A;   KNEE ARTHROSCOPY Bilateral    ACL   KNEE ARTHROSCOPY W/ ACL RECONSTRUCTION     RADIAL KERATOTOMY     ROTATOR CUFF REPAIR Bilateral    SHOULDER ARTHROSCOPY WITH BICEPS TENDON REPAIR Left 04/16/2019   Procedure: SHOULDER ARTHROSCOPY WITH BICEPS TENDON REPAIR, MINI OPEN SUPERIOR CAPSULAR RECONSTRUCTION, BICEPS TENODESIS;  Surgeon: Leim Fabry, MD;  Location: [facility] ORS;  Service: Orthopedics;  Laterality: Left;    There were no vitals filed for this visit.   Subjective Assessment - 02/09/21 1143     Subjective  Pt reports from PT he has been feeling dizzy.    Patient is accompanied by: Family member    Pertinent History Parkinson's Disease,  R sided weakness, falls    Limitations balance, strength, coordination, visual perception, problem solving, processing information    Patient Stated Goals "I want to get stronger.  It frustrates me when I have to ask for help with things like cutting my food or buttoning a button."    Currently in Pain? No/denies    Pain Onset More than a month ago                Therapeutic Exercise: Pt tolerated BUE strengthening seated in chair. 4# dumbbell for 10 reps each forearm supination/pronation, wrist flexion/extension, and radial deviation. 5# dumbbell for 10 reps elbow flexion / extension, tricep extension, and bent over rows. Pt requires rest breaks and verbal cues for proper technique. No cues for "reset" this session.     Therapeutic Activity: Pt focused on improving [facility] with manipulating nuts and bolts positioned vertically. Pt unscrewed 1" and 1/2" nuts, however was unable to remove the nuts without dropping them. Pt worked on picking up the nuts from the table and placing them back onto the bolt. Pt unable to use screwdriver to screw/unscrew nuts (pt does not have his glasses). Difficulty noted identifying correct bolt with farther away bolts. Limited by poor proprioception and  numbness in fingertips. Pt required increased time and cues for hand control during the task.          OT Education - 02/09/21 1144     Education Details HEP progression    Person(s) Educated Patient;Spouse    Methods Explanation;Verbal cues;Demonstration    Comprehension Verbalized understanding;Need further instruction;Verbal cues required;Returned demonstration              OT Short Term Goals - 02/04/21 1446       OT SHORT TERM GOAL #1   Title Pt will perform BUE HEP with min vc    Baseline Eval: not yet initiated; 11/19/2020: Pt able to perform with min vc; 12/22/20: indep with cane exercises, need to instruct further in theraband exercises; 02/04/21: added to HEP today for elbow, forearm,  wrist strengthening, mod vc with handout (ongoing)    Time 6    Period Weeks    Status On-going    Target Date 03/06/21      OT SHORT TERM GOAL #2   Title Pt will report RPE of 4 or better to indicate improved tolerance to basic ADLs.    Baseline Eval: RPE of 6 with basic ADLs; 11/19/2020: RPE of 5 with basic ADLs; 12/22/20: RPE for ADLs fluctuates greatly from 4 on a good day, to 10 on a bad day; 12/31/20: RPE continues to fluctate greatly throughout the week 4-10; 02/04/2021: significant fluctuations consistent with report on 12/31/20.    Time 6    Period Weeks    Status On-going    Target Date 03/06/21      OT SHORT TERM GOAL #3   Title Pt will utilize visual compensation strategies with min vc in order to improve basic and IADL performance.    Baseline Eval: pt reports some diplopia and decreased depth perception which limits ADL/IADL performance; Pt has been instructed in diplopia exercises and requires mod vc for compensation strategies when diplopia is present; 12/22/20: pt is indep with visual exercises and knowing when to allow rest breaks or use compensatory strategies, vs request caregiver assist for difficult tasks.    Time 6    Period Weeks    Status Achieved    Target Date 12/22/20               OT Long Term Goals - 02/04/21 1451       OT LONG TERM GOAL #1   Title Pt will increase bilat shoulder strength by 1MM grade in order to improve tolerance to UB ADLs/IADLs and reaching activities.    Baseline Eval: R/L shoulder flex/abd 4-/5, R/L shoulder ER 3+/5; stamina is improving but strength grades still same as eval; R/L shoulder flex/abd 4-, L ER 4-, R ER 3+, L/R IR 4 (pt reports now able to reach easier into overhead kitchen cabinets).    Time 12    Period Weeks    Status On-going    Target Date 03/15/21      OT LONG TERM GOAL #2   Title Pt will manage clothing fasteners and looping belt with modified indep.    Baseline Eval: spouse assists.  Difficulty with tying  shoes, looping belt, snapping and buttoning buttons; Using compensatory strategies for shoe tying as pt now has elastic shoe laces.  Pt now managing his own belt with slight extra time.  Increased time to fasten buttons; 12/22/2020: Pt is consistent to loop belt using compensatory strategies, but struggles with buttoning shirts consistently and typically spouse will assist; 02/04/21: remains  consistent with looping belt, but extensive time needed for buttons; pt struggles with the motor planning and problem solving needed to manage buttons and spouse typically assists to conserve pt's energy.    Time 12    Period Weeks    Status Partially Met    Target Date 03/15/21      OT LONG TERM GOAL #3   Title Pt will cut food with modified indep.    Baseline Eval: spouse assists to cut food; Indep to cut food; 12/22/2020: Pt now struggles to cut meat; 12/31/20: Pt can cut chicken with min vc to use large/exaggerated sawing motions but spouse cuts steak; 02/04/21:  Pt cuts meat with intermittent min vc for use of exaggerated sawing motions    Time 12    Period Weeks    Status On-going    Target Date 03/15/21      OT LONG TERM GOAL #4   Title Pt will brush hair with R dominant arm with modified indep.    Baseline Eval: Pt states he must hold R arm up with L hand, and often spouse must assist; 11/19/2020: Pt can reach to top and back of head with RUE but arm fatigues quickly with repetition required to brush hair.    Time 12    Period Weeks    Status Achieved    Target Date 12/22/20      OT LONG TERM GOAL #5   Title Pt will increase FOTO score to 62 or better to indicate measureable functional improvement with ADLs/IADLs.    Baseline Eval: FOTO score of 58; 12/22/2020: 57    Time 12    Period Weeks    Status On-going    Target Date 03/15/21      OT LONG TERM GOAL #6   Title Pt will improve time management skills utilizing external compensatory strategies such as calendars, schedules, alarms as needed with  min A.    Baseline 12/22/2020: Not yet initiated (spouse reports that pt is taking longer to complete ADLs yet he is not allowing extra time to complete, causing a rush to get out the door); 02/04/21 Pt has new phone and plans to set up alarms to remind him for medication administration, but has not implemented.    Time 12    Period Weeks    Status On-going    Target Date 03/15/21      OT LONG TERM GOAL #7   Title Pt will improve dynamic standing balance to enable pt to feed dogs with modified indep.    Baseline 12/22/2020: Several falls over the last week; pt is inconsistent to use his rollator for dynamic standing tasks.  Would need supv to feed dogs; 02/04/21: pt can feed dogs with modified indep using large scoopers and pitcher of water to pour into dishes on the floor rather than carry dishes full of water or food.    Time 12    Period Weeks    Status Achieved    Target Date 03/15/21                   Plan - 02/09/21 1146     Clinical Impression Statement Pt demonstrates HEP with good technique, additional exercises provided. Pt requires no cues to "reset" during exercises. Pt requires increased time and cues to unscrew/screw bolts - limited by poor proprioception and numbness in fingertips. Pt is working towards improving consistency, with help from spouse, to carry over compensatory ADL strategies, reinforce fall prevention with  ADLs, and progress and adapt HEP as physical abilities continue to change as a result of pt's PD.    OT Occupational Profile and History Detailed Assessment- Review of Records and additional review of physical, cognitive, psychosocial history related to current functional performance    Occupational performance deficits (Please refer to evaluation for details): ADL's;IADL's;Leisure    Body Structure / Function / Physical Skills ADL;Coordination;Endurance;GMC;UE functional use;Balance;Sensation;IADL;Vision;Dexterity;FMC;Strength;Continence;Gait;Mobility     Rehab Potential Good    Clinical Decision Making Several treatment options, min-mod task modification necessary    Comorbidities Affecting Occupational Performance: May have comorbidities impacting occupational performance    Modification or Assistance to Complete Evaluation  Min-Moderate modification of tasks or assist with assess necessary to complete eval    OT Frequency 2x / week    OT Duration 12 weeks    OT Treatment/Interventions Self-care/ADL training;Therapeutic exercise;DME and/or AE instruction;Balance training;Neuromuscular education;Manual Therapy;Visual/perceptual remediation/compensation;Therapeutic activities;Patient/family education    Consulted and Agree with Plan of Care Patient             Patient will benefit from skilled therapeutic intervention in order to improve the following deficits and impairments:   Body Structure / Function / Physical Skills: ADL, Coordination, Endurance, GMC, UE functional use, Balance, Sensation, IADL, Vision, Dexterity, FMC, Strength, Continence, Gait, Mobility       Visit Diagnosis: Muscle weakness (generalized)  Other lack of coordination  Parkinson's disease [facility])    Problem List Patient Active Problem List   Diagnosis Date Noted   Focal seizure ([place]) 05/24/2020   Parkinson's disease ([place])    History of migraine headaches    Migraine syndrome    BPH (benign prostatic hyperplasia)    Depression    Degenerative spondylolisthesis 05/07/2016    Dessie Coma, M.S. OTR/L  02/09/21, 12:26 PM  ascom [phone]   [place] MAIN [facility] SERVICES [address] [place], [place], [address] Phone: [phone]   Fax:  [phone]  Name: GENTLE HOGE MRN: [id number] Date of Birth: [birth year]

## 2021-02-11 ENCOUNTER — Ambulatory Visit: Payer: Medicare PPO | Admitting: Occupational Therapy

## 2021-02-11 ENCOUNTER — Encounter: Payer: Medicare PPO | Admitting: Speech Pathology

## 2021-02-11 ENCOUNTER — Other Ambulatory Visit: Payer: Self-pay

## 2021-02-11 ENCOUNTER — Ambulatory Visit: Payer: Medicare PPO

## 2021-02-11 DIAGNOSIS — M6281 Muscle weakness (generalized): Secondary | ICD-10-CM

## 2021-02-11 DIAGNOSIS — R278 Other lack of coordination: Secondary | ICD-10-CM

## 2021-02-11 DIAGNOSIS — R2689 Other abnormalities of gait and mobility: Secondary | ICD-10-CM

## 2021-02-11 DIAGNOSIS — R2681 Unsteadiness on feet: Secondary | ICD-10-CM

## 2021-02-11 NOTE — Therapy (Signed)
[place] MAIN [facility] SERVICES [address] [place], [place], [address] Phone: [phone]   Fax:  [phone]  Physical Therapy Treatment  Patient Details  Name: Cory [name] MRN: [id number] Date of Birth: March 13, 1956 Referring Provider (PT): Benay Spice   Encounter Date: 02/11/2021   PT End of Session - 02/11/21 1108     Visit Number 85    Number of Visits 45    Date for PT Re-Evaluation 03/04/21    Authorization Type [employer] PPO    Authorization Time Period Cert 06/08/43-62/56/38    PT Start Time 1102    PT Stop Time 1144    PT Time Calculation (min) 42 min    Equipment Utilized During Treatment Gait belt    Activity Tolerance Patient tolerated treatment well    Behavior During Therapy WFL for tasks assessed/performed             Past Medical History:  Diagnosis Date   Arthritis    hips and back   Asthma    as a baby-    GERD (gastroesophageal reflux disease)    rare -    Parkinson's disease ([place])    Pneumonia    age 79   Rash    legs- on going    Past Surgical History:  Procedure Laterality Date   APPENDECTOMY     BACK SURGERY     posterior lumbar spine fusion L4-5   COLONOSCOPY     COLONOSCOPY WITH PROPOFOL N/A 06/06/2017   Procedure: COLONOSCOPY WITH PROPOFOL;  Surgeon: Manya Silvas, MD;  Location: [facility] ENDOSCOPY;  Service: Endoscopy;  Laterality: N/A;   KNEE ARTHROSCOPY Bilateral    ACL   KNEE ARTHROSCOPY W/ ACL RECONSTRUCTION     RADIAL KERATOTOMY     ROTATOR CUFF REPAIR Bilateral    SHOULDER ARTHROSCOPY WITH BICEPS TENDON REPAIR Left 04/16/2019   Procedure: SHOULDER ARTHROSCOPY WITH BICEPS TENDON REPAIR, MINI OPEN SUPERIOR CAPSULAR RECONSTRUCTION, BICEPS TENODESIS;  Surgeon: Leim Fabry, MD;  Location: [facility] ORS;  Service: Orthopedics;  Laterality: Left;    There were no vitals filed for this visit.   Subjective Assessment - 02/11/21 1107     Subjective Patient presents without wife.  Reports soreness in legs and feel heavy. No falls but occasional leans.    Patient is accompained by: Family member    Pertinent History Patient admitted to hospital 05/23/20 with code stroke and R sided weakness. Imaging negative for acute changes. PMH includes Parkinson's with autonomic instability, asthma, GERD depression, degenerative spondylolisthesis, focal seizures and migraine syndrome. Patient had three ED visits for falls since then. Patient reports when he has the seizures he has pain in his temples and bright flashes in his vision. Last week had three falls in one day.    Limitations Lifting;Standing;Walking;House hold activities    How long can you sit comfortably? sleeps in easy chair.    How long can you stand comfortably? feel unsteady once in standing.    How long can you walk comfortably? difficult to walk during "off period"    Patient Stated Goals stability, have a device that will be most helpful, help to become and stay independent.    Currently in Pain? No/denies                  Nustep Lvl 6 cues for RPM> 70; seat position 8; 4 minutes for cardiovascular and musculoskeletal challenge.   ambulate across stable and unstable surface outside. Negotiating  changing surfaces from grass to sidewalk, across brick with turns and obstacles in pathway without LOB.x 26 minutes. Very challenging over pine needles with frequent re-sets required. Patient requires increased verbal cues for picking up feet with inclines and declines.    sit to stand from rocking chair x 5 trials cues to come to front of chair; keep feet wide and lean nose over toes   Precor leg press: bilateral LE #55; 15x; 2 sets with second set #70 cues for slowing velocity for improved muscle contraction  Precor leg press: single LE #25: 15x each LE   seated: Lateral step over hedgehog 12x each LE    Pt educated throughout session about proper posture and technique with exercises. Improved exercise  technique, movement at target joints, use of target muscles after min to mod verbal, visual, tactile cues.  Patient educated on ambulation with changing of surfaces from brick to cement to pine needles. Occasional needs for stopping to "reset" patient required. He has increased shuffle steppage with inclines and declines requiring additional verbal cueing for picking feet up and width of BOS. Patient remains highly motivated throughout physical therapy session. Patient will continue to benefit from skilled PT to address core strengthening, postural exercises, balance, and functional mobility to increase safety and independence with ADLs               PT Education - 02/11/21 1107     Education Details exercise technique, body mechanics    Person(s) Educated Patient    Methods Explanation;Demonstration;Tactile cues;Verbal cues    Comprehension Verbalized understanding;Returned demonstration;Verbal cues required;Tactile cues required              PT Short Term Goals - 10/21/20 0903       PT SHORT TERM GOAL #1   Title Patient will be independent in home exercise program to improve strength/mobility for better functional independence with ADLs.    Baseline 6/16: HEP to be given next session 7/19: HEP compliant    Time 4    Period Weeks    Status Achieved    Target Date 10/16/20               PT Long Term Goals - 01/16/21 1018       PT LONG TERM GOAL #1   Title Patient will increase FOTO score to equal to or greater than 51%    to demonstrate statistically significant improvement in mobility and quality of life.    Baseline 6/16: 48% 7/19: 57% 8/31: 50%    Time 12    Period Weeks    Status Achieved      PT LONG TERM GOAL #2   Title Patient will increase Berg Balance score by > 6 points (34/56)  to demonstrate decreased fall risk during functional activities.    Baseline 6/16: 28/56 7/19: 45/56    Time 12    Period Weeks    Status Achieved      PT LONG TERM  GOAL #3   Title Patient will deny any falls over past 4 weeks to demonstrate improved safety awareness at home and work.    Baseline 6/16: multiple falls 7/19: no falls reported 9/7: 3 falls in past month; 2 falls in past month    Time 12    Period Weeks    Status On-going    Target Date 03/04/21      PT LONG TERM GOAL #4   Title Patient will increase six minute walk test distance to >  1300 for progression to age norm community ambulator and improve gait ability    Baseline 6/16: 945 ft with multiple near falls 7/19: 1495 ft    Time 12    Period Weeks    Status Achieved      PT LONG TERM GOAL #5   Title Patient will increase Berg Balance score by > 6 points (51/56)  to demonstrate decreased fall risk during functional activities.    Baseline 7/19: 45/56 8/31: 49/56; 10/14: 52/56    Time 12    Period Weeks    Status Partially Met    Target Date 03/04/21      PT LONG TERM GOAL #6   Title Patient will increase Functional Gait Assessment score to >20/30 as to reduce fall risk and improve dynamic gait safety with community ambulation.    Baseline 7/19: 16/30 8/31: 20/30;    Time 12    Period Weeks    Status Achieved      PT LONG TERM GOAL #7   Title Patient will increase Functional Gait Assessment score to >26/30 as to reduce fall risk and improve dynamic gait safety with community ambulation.    Baseline 8/31: 20/30; 10/14: 22/30    Time 12    Period Weeks    Status On-going    Target Date 01/16/21      PT LONG TERM GOAL #8   Title Patient will static stand for approximately 5 minutes and perturbations for shooting/teaching classes without loss of balance or requiring object to lean on.    Baseline Unable to perform without leaning on objects; 10/14: Still unable to perform without leaning on objects.    Time 12    Period Weeks    Status On-going    Target Date 03/04/21      PT LONG TERM GOAL  #9   TITLE Patient will be able to ambulate approximately 1 mile walking dog  across changing surfaces such as grass, concrete, and sidewalk without loss of balance.    Baseline 9/7: unable to perform yet; 10/14: Unable    Time 12    Period Weeks    Status On-going    Target Date 03/04/21      PT LONG TERM GOAL  #10   TITLE Patient will bend down and pick up items without losing balance to perform iADLs such as emptying dishwasher and feeding dog.    Baseline 9/7: unable to perform; 10/14: Performing at home without falls or LOB.    Time 12    Period Weeks    Status Achieved    Target Date 03/04/21                   Plan - 02/11/21 1136     Clinical Impression Statement Patient educated on ambulation with changing of surfaces from brick to cement to pine needles. Occasional needs for stopping to "reset" patient required. He has increased shuffle steppage with inclines and declines requiring additional verbal cueing for picking feet up and width of BOS. Patient remains highly motivated throughout physical therapy session. Patient will continue to benefit from skilled PT to address core strengthening, postural exercises, balance, and functional mobility to increase safety and independence with ADLs    Personal Factors and Comorbidities Age;Comorbidity 3+;Fitness;Past/Current Experience;Time since onset of injury/illness/exacerbation;Transportation    Comorbidities Parkinson's with autonomic instability, asthma, GERD depression, degenerative spondylolisthesis, focal seizures and migraine syndrome    Examination-Activity Limitations Bathing;Bed Mobility;Bend;Caring for [employer];Locomotion Level;Lift;Hygiene/Grooming;Squat;Stairs;Stand;Toileting;Transfers  Examination-Participation Restrictions Church;Cleaning;Community Activity;Driving;Meal Prep;Medication Management;Laundry;Shop;Volunteer;Yard Work    [employer]chant [employer] officer Evolving/Moderate complexity    Rehab Potential Fair    PT Frequency 2x / week    PT Duration 12  weeks    PT Treatment/Interventions ADLs/Self Care Home Management;Biofeedback;Canalith Repostioning;Cryotherapy;Parrafin;Ultrasound;Traction;Moist Heat;Iontophoresis 1m/ml Dexamethasone;Electrical Stimulation;DME Instruction;Gait training;Stair training;Functional mobility training;Neuromuscular re-education;Balance training;Therapeutic exercise;Therapeutic activities;Patient/family education;Orthotic Fit/Training;Manual techniques;Passive range of motion;Manual lymph drainage;Energy conservation;Splinting;Taping;Vasopneumatic Device;Vestibular;Joint Manipulations;Visual/perceptual remediation/compensation    PT Next Visit Plan Increase cognitive demand/dual tasking during gait, progress thoracic opening and rotaiton exercises, core strengthening, progress dynamic balance and gait training for increased BOS and dual tasking. Add core strengthening to HEP. Add dynadisc tasks with backwards rotational component.    PT Home Exercise Plan No updates today    Consulted and Agree with Plan of Care Patient    Family Member Consulted wife             Patient will benefit from skilled therapeutic intervention in order to improve the following deficits and impairments:  Abnormal gait, Decreased activity tolerance, Decreased balance, Decreased knowledge of precautions, Decreased endurance, Decreased coordination, Decreased cognition, Decreased knowledge of use of DME, Decreased mobility, Decreased safety awareness, Difficulty walking, Decreased strength, Impaired flexibility, Impaired perceived functional ability, Impaired sensation, Impaired tone, Impaired UE functional use, Impaired vision/preception, Improper body mechanics, Postural dysfunction  Visit Diagnosis: Muscle weakness (generalized)  Other abnormalities of gait and mobility  Unsteadiness on feet     Problem List Patient Active Problem List   Diagnosis Date Noted   [place] 05/24/[place]   [place]'s [place]    History  of migraine headaches    Migraine syndrome    BPH (benign prostatic hyperplasia)    Depression    Degenerative spondylolisthesis 05/07/2016    MJanna Arch PT, DPT  02/11/2021, 11:54 AM  [place] [facility] [address] [address]Phone: [phone]  Fax:  [phone] Name: Cory LEMMEMRN: [id number] of Birth: [birth year]

## 2021-02-14 NOTE — Therapy (Signed)
[place] MAIN [facility] SERVICES [address] [place], [place], [address] Phone: [phone]   Fax:  [phone]  Occupational Therapy Treatment  Patient Details  Name: Cory [name] MRN: [id number] Date of Birth: Mar 28, 1956 No data recorded  Encounter Date: 02/11/2021   OT End of Session - 02/14/21 1827     Visit Number 32    Number of Visits 35    Date for OT Re-Evaluation 03/15/21    Authorization Time Period Reporting period starting 01/05/2021    OT Start Time 1146    OT Stop Time 1230    OT Time Calculation (min) 44 min    Activity Tolerance Patient tolerated treatment well    Behavior During Therapy WFL for tasks assessed/performed             Past Medical History:  Diagnosis Date   Arthritis    hips and back   Asthma    as a baby-    GERD (gastroesophageal reflux disease)    rare -    Parkinson's disease ([place])    Pneumonia    age 65   Rash    legs- on going    Past Surgical History:  Procedure Laterality Date   APPENDECTOMY     BACK SURGERY     posterior lumbar spine fusion L4-5   COLONOSCOPY     COLONOSCOPY WITH PROPOFOL N/A 06/06/2017   Procedure: COLONOSCOPY WITH PROPOFOL;  Surgeon: Manya Silvas, MD;  Location: [facility] ENDOSCOPY;  Service: Endoscopy;  Laterality: N/A;   KNEE ARTHROSCOPY Bilateral    ACL   KNEE ARTHROSCOPY W/ ACL RECONSTRUCTION     RADIAL KERATOTOMY     ROTATOR CUFF REPAIR Bilateral    SHOULDER ARTHROSCOPY WITH BICEPS TENDON REPAIR Left 04/16/2019   Procedure: SHOULDER ARTHROSCOPY WITH BICEPS TENDON REPAIR, MINI OPEN SUPERIOR CAPSULAR RECONSTRUCTION, BICEPS TENODESIS;  Surgeon: Leim Fabry, MD;  Location: [facility] ORS;  Service: Orthopedics;  Laterality: Left;    There were no vitals filed for this visit.   Subjective Assessment - 02/14/21 1826     Subjective  Pt reports he is doing well today    Patient is accompanied by: Family member    Pertinent History Parkinson's Disease, R sided  weakness, falls    Limitations balance, strength, coordination, visual perception, problem solving, processing information    Patient Stated Goals "I want to get stronger.  It frustrates me when I have to ask for help with things like cutting my food or buttoning a button."    Currently in Pain? No/denies    Pain Score 0-No pain    Pain Onset More than a month ago             Therapeutic Exercise: Pt tolerated BUE strengthening seated in chair. Difficulty with use of weighted bar with limitations bilaterally at shoulders.   Switched focus to more distal strengthening with use of 3# dumbbell for 10 reps each forearm supination/pronation, wrist flexion/extension, and radial deviation. 4# dumbbell for 10 reps elbow flexion / extension, tricep extension, and bent over rows. Pt requires rest breaks and verbal cues for proper technique.     Neuro reeducation: Pt seen for manipulation of small pegs to pick up and place into grid, removing with cues to move items to palm, use hand for storage and then move items back to fingertips.  Difficulty with manipulation skills to turn items for positioning to place into grid.    Response to tx: Pt continues  to demonstrate limitations in bilateral shoulders for ROM and strength from previous rotator cuff issues.  Pt able to focus on use of weights for distal strengthening, cues for proper form and technique.  Pt with numbness in fingertips and decreased proprioception which affects his ability to complete manipulation of smaller objects.  Requires use of vision to help compensate when completing these tasks.  Continue to work towards goals in plan of care to maximize safety and independence in daily tasks.                    OT Education - 02/14/21 1827     Education Details HEP progression    Person(s) Educated Patient;Spouse    Methods Explanation;Verbal cues;Demonstration    Comprehension Verbalized understanding;Need further  instruction;Verbal cues required;Returned demonstration              OT Short Term Goals - 02/04/21 1446       OT SHORT TERM GOAL #1   Title Pt will perform BUE HEP with min vc    Baseline Eval: not yet initiated; 11/19/2020: Pt able to perform with min vc; 12/22/20: indep with cane exercises, need to instruct further in theraband exercises; 02/04/21: added to HEP today for elbow, forearm, wrist strengthening, mod vc with handout (ongoing)    Time 6    Period Weeks    Status On-going    Target Date 03/06/21      OT SHORT TERM GOAL #2   Title Pt will report RPE of 4 or better to indicate improved tolerance to basic ADLs.    Baseline Eval: RPE of 6 with basic ADLs; 11/19/2020: RPE of 5 with basic ADLs; 12/22/20: RPE for ADLs fluctuates greatly from 4 on a good day, to 10 on a bad day; 12/31/20: RPE continues to fluctate greatly throughout the week 4-10; 02/04/2021: significant fluctuations consistent with report on 12/31/20.    Time 6    Period Weeks    Status On-going    Target Date 03/06/21      OT SHORT TERM GOAL #3   Title Pt will utilize visual compensation strategies with min vc in order to improve basic and IADL performance.    Baseline Eval: pt reports some diplopia and decreased depth perception which limits ADL/IADL performance; Pt has been instructed in diplopia exercises and requires mod vc for compensation strategies when diplopia is present; 12/22/20: pt is indep with visual exercises and knowing when to allow rest breaks or use compensatory strategies, vs request caregiver assist for difficult tasks.    Time 6    Period Weeks    Status Achieved    Target Date 12/22/20               OT Long Term Goals - 02/04/21 1451       OT LONG TERM GOAL #1   Title Pt will increase bilat shoulder strength by 1MM grade in order to improve tolerance to UB ADLs/IADLs and reaching activities.    Baseline Eval: R/L shoulder flex/abd 4-/5, R/L shoulder ER 3+/5; stamina is improving but  strength grades still same as eval; R/L shoulder flex/abd 4-, L ER 4-, R ER 3+, L/R IR 4 (pt reports now able to reach easier into overhead kitchen cabinets).    Time 12    Period Weeks    Status On-going    Target Date 03/15/21      OT LONG TERM GOAL #2   Title Pt will manage clothing fasteners  and looping belt with modified indep.    Baseline Eval: spouse assists.  Difficulty with tying shoes, looping belt, snapping and buttoning buttons; Using compensatory strategies for shoe tying as pt now has elastic shoe laces.  Pt now managing his own belt with slight extra time.  Increased time to fasten buttons; 12/22/2020: Pt is consistent to loop belt using compensatory strategies, but struggles with buttoning shirts consistently and typically spouse will assist; 02/04/21: remains consistent with looping belt, but extensive time needed for buttons; pt struggles with the motor planning and problem solving needed to manage buttons and spouse typically assists to conserve pt's energy.    Time 12    Period Weeks    Status Partially Met    Target Date 03/15/21      OT LONG TERM GOAL #3   Title Pt will cut food with modified indep.    Baseline Eval: spouse assists to cut food; Indep to cut food; 12/22/2020: Pt now struggles to cut meat; 12/31/20: Pt can cut chicken with min vc to use large/exaggerated sawing motions but spouse cuts steak; 02/04/21:  Pt cuts meat with intermittent min vc for use of exaggerated sawing motions    Time 12    Period Weeks    Status On-going    Target Date 03/15/21      OT LONG TERM GOAL #4   Title Pt will brush hair with R dominant arm with modified indep.    Baseline Eval: Pt states he must hold R arm up with L hand, and often spouse must assist; 11/19/2020: Pt can reach to top and back of head with RUE but arm fatigues quickly with repetition required to brush hair.    Time 12    Period Weeks    Status Achieved    Target Date 12/22/20      OT LONG TERM GOAL #5   Title Pt  will increase FOTO score to 62 or better to indicate measureable functional improvement with ADLs/IADLs.    Baseline Eval: FOTO score of 58; 12/22/2020: 57    Time 12    Period Weeks    Status On-going    Target Date 03/15/21      OT LONG TERM GOAL #6   Title Pt will improve time management skills utilizing external compensatory strategies such as calendars, schedules, alarms as needed with min A.    Baseline 12/22/2020: Not yet initiated (spouse reports that pt is taking longer to complete ADLs yet he is not allowing extra time to complete, causing a rush to get out the door); 02/04/21 Pt has new phone and plans to set up alarms to remind him for medication administration, but has not implemented.    Time 12    Period Weeks    Status On-going    Target Date 03/15/21      OT LONG TERM GOAL #7   Title Pt will improve dynamic standing balance to enable pt to feed dogs with modified indep.    Baseline 12/22/2020: Several falls over the last week; pt is inconsistent to use his rollator for dynamic standing tasks.  Would need supv to feed dogs; 02/04/21: pt can feed dogs with modified indep using large scoopers and pitcher of water to pour into dishes on the floor rather than carry dishes full of water or food.    Time 12    Period Weeks    Status Achieved    Target Date 03/15/21  Plan - 02/14/21 1828     Clinical Impression Statement Pt continues to demonstrate limitations in bilateral shoulders for ROM and strength from previous rotator cuff issues.  Pt able to focus on use of weights for distal strengthening, cues for proper form and technique.  Pt with numbness in fingertips and decreased proprioception which affects his ability to complete manipulation of smaller objects.  Requires use of vision to help compensate when completing these tasks.  Continue to work towards goals in plan of care to maximize safety and independence in daily tasks.    OT Occupational Profile  and History Detailed Assessment- Review of Records and additional review of physical, cognitive, psychosocial history related to current functional performance    Occupational performance deficits (Please refer to evaluation for details): ADL's;IADL's;Leisure    Body Structure / Function / Physical Skills ADL;Coordination;Endurance;GMC;UE functional use;Balance;Sensation;IADL;Vision;Dexterity;FMC;Strength;Continence;Gait;Mobility    Rehab Potential Good    Clinical Decision Making Several treatment options, min-mod task modification necessary    Comorbidities Affecting Occupational Performance: May have comorbidities impacting occupational performance    Modification or Assistance to Complete Evaluation  Min-Moderate modification of tasks or assist with assess necessary to complete eval    OT Frequency 2x / week    OT Duration 12 weeks    OT Treatment/Interventions Self-care/ADL training;Therapeutic exercise;DME and/or AE instruction;Balance training;Neuromuscular education;Manual Therapy;Visual/perceptual remediation/compensation;Therapeutic activities;Patient/family education    Consulted and Agree with Plan of Care Patient             Patient will benefit from skilled therapeutic intervention in order to improve the following deficits and impairments:   Body Structure / Function / Physical Skills: ADL, Coordination, Endurance, GMC, UE functional use, Balance, Sensation, IADL, Vision, Dexterity, FMC, Strength, Continence, Gait, Mobility       Visit Diagnosis: Muscle weakness (generalized)  Other lack of coordination  Unsteadiness on feet    Problem List Patient Active Problem List   Diagnosis Date Noted   Focal seizure ([place]) 05/24/2020   Parkinson's disease ([place])    History of migraine headaches    Migraine syndrome    BPH (benign prostatic hyperplasia)    Depression    Degenerative spondylolisthesis 05/07/2016   Cory [name], OTR/L, CLT  Cory [name], OT/L 02/15/2021,  9:19 PM  [place] [address] [place], [place], [address] Phone: [phone]   Fax:  [phone]  Name: ESCHOL AUXIER MRN: [id number] Date of Birth: [birth year]

## 2021-02-16 ENCOUNTER — Other Ambulatory Visit: Payer: Self-pay

## 2021-02-16 ENCOUNTER — Ambulatory Visit: Payer: Medicare PPO

## 2021-02-16 DIAGNOSIS — M6281 Muscle weakness (generalized): Secondary | ICD-10-CM

## 2021-02-16 DIAGNOSIS — R278 Other lack of coordination: Secondary | ICD-10-CM

## 2021-02-16 DIAGNOSIS — G20A1 Parkinson's disease without dyskinesia, without mention of fluctuations: Secondary | ICD-10-CM

## 2021-02-16 DIAGNOSIS — R2681 Unsteadiness on feet: Secondary | ICD-10-CM

## 2021-02-16 DIAGNOSIS — R2689 Other abnormalities of gait and mobility: Secondary | ICD-10-CM

## 2021-02-16 DIAGNOSIS — G20 Parkinson's disease: Secondary | ICD-10-CM

## 2021-02-16 NOTE — Therapy (Signed)
[place] MAIN [facility] SERVICES [address] [place], [place], [address] Phone: [phone]   Fax:  [phone]  Physical Therapy Treatment  Patient Details  Name: Cory [name] MRN: [id number] Date of Birth: 15-Apr-1955 Referring Provider (PT): Benay Spice   Encounter Date: 02/16/2021   PT End of Session - 02/16/21 1057     Visit Number 68    Number of Visits 36    Date for PT Re-Evaluation 03/04/21    Authorization Type [employer] PPO    Authorization Time Period Cert 09/04/68-35/00/93    PT Start Time 1100    PT Stop Time 1144    PT Time Calculation (min) 44 min    Equipment Utilized During Treatment Gait belt    Activity Tolerance Patient tolerated treatment well    Behavior During Therapy WFL for tasks assessed/performed             Past Medical History:  Diagnosis Date   Arthritis    hips and back   Asthma    as a baby-    GERD (gastroesophageal reflux disease)    rare -    Parkinson's disease ([place])    Pneumonia    age 64   Rash    legs- on going    Past Surgical History:  Procedure Laterality Date   APPENDECTOMY     BACK SURGERY     posterior lumbar spine fusion L4-5   COLONOSCOPY     COLONOSCOPY WITH PROPOFOL N/A 06/06/2017   Procedure: COLONOSCOPY WITH PROPOFOL;  Surgeon: Manya Silvas, MD;  Location: [facility] ENDOSCOPY;  Service: Endoscopy;  Laterality: N/A;   KNEE ARTHROSCOPY Bilateral    ACL   KNEE ARTHROSCOPY W/ ACL RECONSTRUCTION     RADIAL KERATOTOMY     ROTATOR CUFF REPAIR Bilateral    SHOULDER ARTHROSCOPY WITH BICEPS TENDON REPAIR Left 04/16/2019   Procedure: SHOULDER ARTHROSCOPY WITH BICEPS TENDON REPAIR, MINI OPEN SUPERIOR CAPSULAR RECONSTRUCTION, BICEPS TENODESIS;  Surgeon: Leim Fabry, MD;  Location: [facility] ORS;  Service: Orthopedics;  Laterality: Left;    There were no vitals filed for this visit.   Subjective Assessment - 02/16/21 1105     Subjective Patient reports feeling dizzy  in the shower. No falls but a few stumbles since last session.    Patient is accompained by: Family member    Pertinent History Patient admitted to hospital 05/23/20 with code stroke and R sided weakness. Imaging negative for acute changes. PMH includes Parkinson's with autonomic instability, asthma, GERD depression, degenerative spondylolisthesis, focal seizures and migraine syndrome. Patient had three ED visits for falls since then. Patient reports when he has the seizures he has pain in his temples and bright flashes in his vision. Last week had three falls in one day.    Limitations Lifting;Standing;Walking;House hold activities    How long can you sit comfortably? sleeps in easy chair.    How long can you stand comfortably? feel unsteady once in standing.    How long can you walk comfortably? difficult to walk during "off period"    Patient Stated Goals stability, have a device that will be most helpful, help to become and stay independent.    Currently in Pain? No/denies              BP at start of session: Seated:149/84  Standing: 129/77    Standing: Bosu ball:  -flat side up static stand 60 seconds x 2 trials -round side up: modified lunges  12x each LE; SUE support   Three hedgehog taps: forward lateral, backwards 10x each LE; BUE support   Sit to stand jumps 10x  Seated on dynadisc: -cross body punches x2 minutes -hip flexion marches  x 20 each LE   seated: Lateral step over hedgehog 12x each LE  GTB adduction 15x each LE GTB hamstring curl 15x each LE GTB around bilateral ankles: alternating LAQ 15x each LE    Pt educated throughout session about proper posture and technique with exercises. Improved exercise technique, movement at target joints, use of target muscles after min to mod verbal, visual, tactile cues.    Patient initially is more unstable this session requiring cues for focus, breathing technique, and recentering self before attempting an exercise.  Patient improved by end of session. Patient remains highly motivated throughout physical therapy session. Patient will continue to benefit from skilled PT to address core strengthening, postural exercises, balance, and functional mobility to increase safety and independence with ADLs                     PT Education - 02/16/21 1057     Education Details exercise technique, body mechanics    Person(s) Educated Patient    Methods Explanation;Demonstration;Tactile cues;Verbal cues    Comprehension Verbalized understanding;Returned demonstration;Verbal cues required;Tactile cues required              PT Short Term Goals - 10/21/20 0903       PT SHORT TERM GOAL #1   Title Patient will be independent in home exercise program to improve strength/mobility for better functional independence with ADLs.    Baseline 6/16: HEP to be given next session 7/19: HEP compliant    Time 4    Period Weeks    Status Achieved    Target Date 10/16/20               PT Long Term Goals - 01/16/21 1018       PT LONG TERM GOAL #1   Title Patient will increase FOTO score to equal to or greater than 51%    to demonstrate statistically significant improvement in mobility and quality of life.    Baseline 6/16: 48% 7/19: 57% 8/31: 50%    Time 12    Period Weeks    Status Achieved      PT LONG TERM GOAL #2   Title Patient will increase Berg Balance score by > 6 points (34/56)  to demonstrate decreased fall risk during functional activities.    Baseline 6/16: 28/56 7/19: 45/56    Time 12    Period Weeks    Status Achieved      PT LONG TERM GOAL #3   Title Patient will deny any falls over past 4 weeks to demonstrate improved safety awareness at home and work.    Baseline 6/16: multiple falls 7/19: no falls reported 9/7: 3 falls in past month; 2 falls in past month    Time 12    Period Weeks    Status On-going    Target Date 03/04/21      PT LONG TERM GOAL #4   Title Patient  will increase six minute walk test distance to >1300 for progression to age norm community ambulator and improve gait ability    Baseline 6/16: 945 ft with multiple near falls 7/19: 1495 ft    Time 12    Period Weeks    Status Achieved      PT  LONG TERM GOAL #5   Title Patient will increase Berg Balance score by > 6 points (51/56)  to demonstrate decreased fall risk during functional activities.    Baseline 7/19: 45/56 8/31: 49/56; 10/14: 52/56    Time 12    Period Weeks    Status Partially Met    Target Date 03/04/21      PT LONG TERM GOAL #6   Title Patient will increase Functional Gait Assessment score to >20/30 as to reduce fall risk and improve dynamic gait safety with community ambulation.    Baseline 7/19: 16/30 8/31: 20/30;    Time 12    Period Weeks    Status Achieved      PT LONG TERM GOAL #7   Title Patient will increase Functional Gait Assessment score to >26/30 as to reduce fall risk and improve dynamic gait safety with community ambulation.    Baseline 8/31: 20/30; 10/14: 22/30    Time 12    Period Weeks    Status On-going    Target Date 01/16/21      PT LONG TERM GOAL #8   Title Patient will static stand for approximately 5 minutes and perturbations for shooting/teaching classes without loss of balance or requiring object to lean on.    Baseline Unable to perform without leaning on objects; 10/14: Still unable to perform without leaning on objects.    Time 12    Period Weeks    Status On-going    Target Date 03/04/21      PT LONG TERM GOAL  #9   TITLE Patient will be able to ambulate approximately 1 mile walking dog across changing surfaces such as grass, concrete, and sidewalk without loss of balance.    Baseline 9/7: unable to perform yet; 10/14: Unable    Time 12    Period Weeks    Status On-going    Target Date 03/04/21      PT LONG TERM GOAL  #10   TITLE Patient will bend down and pick up items without losing balance to perform iADLs such as emptying  dishwasher and feeding dog.    Baseline 9/7: unable to perform; 10/14: Performing at home without falls or LOB.    Time 12    Period Weeks    Status Achieved    Target Date 03/04/21                   Plan - 02/16/21 1216     Clinical Impression Statement Patient initially is more unstable this session requiring cues for focus, breathing technique, and recentering self before attempting an exercise. Patient improved by end of session. Patient remains highly motivated throughout physical therapy session. Patient will continue to benefit from skilled PT to address core strengthening, postural exercises, balance, and functional mobility to increase safety and independence with ADLs    Personal Factors and Comorbidities Age;Comorbidity 3+;Fitness;Past/Current Experience;Time since onset of injury/illness/exacerbation;Transportation    Comorbidities Parkinson's with autonomic instability, asthma, GERD depression, degenerative spondylolisthesis, focal seizures and migraine syndrome    Examination-Activity Limitations Bathing;Bed Mobility;Bend;Caring for [employer];Locomotion Level;Lift;Hygiene/Grooming;Squat;Stairs;Stand;Toileting;Transfers    Examination-Participation Restrictions Church;Cleaning;Community Activity;Driving;Meal Prep;Medication Management;Laundry;Shop;Volunteer;Yard Work    Merchant [employer] officer Evolving/Moderate complexity    Rehab Potential Fair    PT Frequency 2x / week    PT Duration 12 weeks    PT Treatment/Interventions ADLs/Self Care Home Management;Biofeedback;Canalith Repostioning;Cryotherapy;Parrafin;Ultrasound;Traction;Moist Heat;Iontophoresis 38m/ml Dexamethasone;Electrical Stimulation;DME Instruction;Gait training;Stair training;Functional mobility training;Neuromuscular re-education;Balance training;Therapeutic exercise;Therapeutic activities;Patient/family education;Orthotic Fit/Training;Manual techniques;Passive range of  motion;Manual lymph drainage;Energy conservation;Splinting;Taping;Vasopneumatic Device;Vestibular;Joint Manipulations;Visual/perceptual remediation/compensation    PT Next Visit Plan Increase cognitive demand/dual tasking during gait, progress thoracic opening and rotaiton exercises, core strengthening, progress dynamic balance and gait training for increased BOS and dual tasking. Add core strengthening to HEP. Add dynadisc tasks with backwards rotational component.    PT Home Exercise Plan No updates today    Consulted and Agree with Plan of Care Patient    Family Member Consulted wife             Patient will benefit from skilled therapeutic intervention in order to improve the following deficits and impairments:  Abnormal gait, Decreased activity tolerance, Decreased balance, Decreased knowledge of precautions, Decreased endurance, Decreased coordination, Decreased cognition, Decreased knowledge of use of DME, Decreased mobility, Decreased safety awareness, Difficulty walking, Decreased strength, Impaired flexibility, Impaired perceived functional ability, Impaired sensation, Impaired tone, Impaired UE functional use, Impaired vision/preception, Improper body mechanics, Postural dysfunction  Visit Diagnosis: Muscle weakness (generalized)  Unsteadiness on feet  Other abnormalities of gait and mobility     Problem List Patient Active Problem List   Diagnosis Date Noted   Focal seizure ([place]) 05/24/2020   Parkinson's disease ([place])    History of migraine headaches    Migraine syndrome    BPH (benign prostatic hyperplasia)    Depression    Degenerative spondylolisthesis 05/07/2016   Janna Arch, PT, DPT  02/16/2021, 12:18 PM  [place] MAIN [facility] SERVICES [address] [place], [place], [address] Phone: [phone]   Fax:  [phone]  Name: Cory [name] MRN: [id number] Date of Birth: Aug 23, 1955

## 2021-02-16 NOTE — Therapy (Signed)
[place] MAIN [facility] SERVICES [address] [place], [place], [address] Phone: [phone]   Fax:  [phone]  Occupational Therapy Treatment  Patient Details  Name: Cory [name] MRN: [id number] Date of Birth: 07/27/1955 No data recorded  Encounter Date: 02/16/2021   OT End of Session - 02/16/21 1530     Visit Number 33    Number of Visits 54    Date for OT Re-Evaluation 03/15/21    Authorization Time Period Reporting period starting 01/05/2021    OT Start Time 1145    OT Stop Time 1232    OT Time Calculation (min) 47 min    Equipment Utilized During Treatment 4 wheeled walker    Activity Tolerance Patient tolerated treatment well    Behavior During Therapy WFL for tasks assessed/performed             Past Medical History:  Diagnosis Date   Arthritis    hips and back   Asthma    as a baby-    GERD (gastroesophageal reflux disease)    rare -    Parkinson's disease ([place])    Pneumonia    age 39   Rash    legs- on going    Past Surgical History:  Procedure Laterality Date   APPENDECTOMY     BACK SURGERY     posterior lumbar spine fusion L4-5   COLONOSCOPY     COLONOSCOPY WITH PROPOFOL N/A 06/06/2017   Procedure: COLONOSCOPY WITH PROPOFOL;  Surgeon: Manya Silvas, MD;  Location: [facility] ENDOSCOPY;  Service: Endoscopy;  Laterality: N/A;   KNEE ARTHROSCOPY Bilateral    ACL   KNEE ARTHROSCOPY W/ ACL RECONSTRUCTION     RADIAL KERATOTOMY     ROTATOR CUFF REPAIR Bilateral    SHOULDER ARTHROSCOPY WITH BICEPS TENDON REPAIR Left 04/16/2019   Procedure: SHOULDER ARTHROSCOPY WITH BICEPS TENDON REPAIR, MINI OPEN SUPERIOR CAPSULAR RECONSTRUCTION, BICEPS TENODESIS;  Surgeon: Leim Fabry, MD;  Location: [facility] ORS;  Service: Orthopedics;  Laterality: Left;    There were no vitals filed for this visit.   Subjective Assessment - 02/16/21 1529     Subjective  "I had a bad weekend.  My legs felt really heavy."    Patient is  accompanied by: Family member    Pertinent History Parkinson's Disease, R sided weakness, falls    Limitations balance, strength, coordination, visual perception, problem solving, processing information    Patient Stated Goals "I want to get stronger.  It frustrates me when I have to ask for help with things like cutting my food or buttoning a button."    Currently in Pain? No/denies    Pain Score 0-No pain    Pain Onset More than a month ago            Occupational Therapy Treatment: Therapeutic Exercise: Facilitated hand strengthening with use of hand gripper set at 17.9# to remove jumbo pegs from pegboard x2 trials each hand, rest breaks between sets.  Performed postural stretch standing against the wall, therapist providing vc to tuck chin, manual stretch to facilitate shoulder retraction.  Performed passive stretching to bilat shoulders all planes, working to increase flexibility for self care performance.    Self Care: Instructed in handwriting strategies to reduce micrographia; used lined paper for visual cueing for letter height, tracing letters and numbers, then replicating letters and numbers beside the traced figures.  Encouraged pt practice with lined paper at home for consistency of letter and number height.  Practiced donning winter coat, requiring min vc to spread coat out wide to better identify L/R arm sleeves.  Min A to thread 2nd arm in behind back.   Response to Treatment: Good tolerance to BUE stretching and distal strengthening.  Pt requested to address handwriting today d/t micrographia.  Pt requires constant cues to utilize lines on paper to maintain letter height; tracing then forming pt's own letters and numbers beside the traced letters was effective to maintain letter height.  Pt will continue to benefit from skilled OT to maximize BUE strength, coordination, safety, and indep with self care.       OT Education - 02/16/21 1530     Education Details handwriting  strategies    Person(s) Educated Patient;Spouse    Methods Explanation;Verbal cues;Demonstration    Comprehension Verbalized understanding;Need further instruction;Verbal cues required;Returned demonstration              OT Short Term Goals - 02/04/21 1446       OT SHORT TERM GOAL #1   Title Pt will perform BUE HEP with min vc    Baseline Eval: not yet initiated; 11/19/2020: Pt able to perform with min vc; 12/22/20: indep with cane exercises, need to instruct further in theraband exercises; 02/04/21: added to HEP today for elbow, forearm, wrist strengthening, mod vc with handout (ongoing)    Time 6    Period Weeks    Status On-going    Target Date 03/06/21      OT SHORT TERM GOAL #2   Title Pt will report RPE of 4 or better to indicate improved tolerance to basic ADLs.    Baseline Eval: RPE of 6 with basic ADLs; 11/19/2020: RPE of 5 with basic ADLs; 12/22/20: RPE for ADLs fluctuates greatly from 4 on a good day, to 10 on a bad day; 12/31/20: RPE continues to fluctate greatly throughout the week 4-10; 02/04/2021: significant fluctuations consistent with report on 12/31/20.    Time 6    Period Weeks    Status On-going    Target Date 03/06/21      OT SHORT TERM GOAL #3   Title Pt will utilize visual compensation strategies with min vc in order to improve basic and IADL performance.    Baseline Eval: pt reports some diplopia and decreased depth perception which limits ADL/IADL performance; Pt has been instructed in diplopia exercises and requires mod vc for compensation strategies when diplopia is present; 12/22/20: pt is indep with visual exercises and knowing when to allow rest breaks or use compensatory strategies, vs request caregiver assist for difficult tasks.    Time 6    Period Weeks    Status Achieved    Target Date 12/22/20               OT Long Term Goals - 02/04/21 1451       OT LONG TERM GOAL #1   Title Pt will increase bilat shoulder strength by 1MM grade in order to  improve tolerance to UB ADLs/IADLs and reaching activities.    Baseline Eval: R/L shoulder flex/abd 4-/5, R/L shoulder ER 3+/5; stamina is improving but strength grades still same as eval; R/L shoulder flex/abd 4-, L ER 4-, R ER 3+, L/R IR 4 (pt reports now able to reach easier into overhead kitchen cabinets).    Time 12    Period Weeks    Status On-going    Target Date 03/15/21      OT LONG TERM GOAL #2  Title Pt will manage clothing fasteners and looping belt with modified indep.    Baseline Eval: spouse assists.  Difficulty with tying shoes, looping belt, snapping and buttoning buttons; Using compensatory strategies for shoe tying as pt now has elastic shoe laces.  Pt now managing his own belt with slight extra time.  Increased time to fasten buttons; 12/22/2020: Pt is consistent to loop belt using compensatory strategies, but struggles with buttoning shirts consistently and typically spouse will assist; 02/04/21: remains consistent with looping belt, but extensive time needed for buttons; pt struggles with the motor planning and problem solving needed to manage buttons and spouse typically assists to conserve pt's energy.    Time 12    Period Weeks    Status Partially Met    Target Date 03/15/21      OT LONG TERM GOAL #3   Title Pt will cut food with modified indep.    Baseline Eval: spouse assists to cut food; Indep to cut food; 12/22/2020: Pt now struggles to cut meat; 12/31/20: Pt can cut chicken with min vc to use large/exaggerated sawing motions but spouse cuts steak; 02/04/21:  Pt cuts meat with intermittent min vc for use of exaggerated sawing motions    Time 12    Period Weeks    Status On-going    Target Date 03/15/21      OT LONG TERM GOAL #4   Title Pt will brush hair with R dominant arm with modified indep.    Baseline Eval: Pt states he must hold R arm up with L hand, and often spouse must assist; 11/19/2020: Pt can reach to top and back of head with RUE but arm fatigues quickly  with repetition required to brush hair.    Time 12    Period Weeks    Status Achieved    Target Date 12/22/20      OT LONG TERM GOAL #5   Title Pt will increase FOTO score to 62 or better to indicate measureable functional improvement with ADLs/IADLs.    Baseline Eval: FOTO score of 58; 12/22/2020: 57    Time 12    Period Weeks    Status On-going    Target Date 03/15/21      OT LONG TERM GOAL #6   Title Pt will improve time management skills utilizing external compensatory strategies such as calendars, schedules, alarms as needed with min A.    Baseline 12/22/2020: Not yet initiated (spouse reports that pt is taking longer to complete ADLs yet he is not allowing extra time to complete, causing a rush to get out the door); 02/04/21 Pt has new phone and plans to set up alarms to remind him for medication administration, but has not implemented.    Time 12    Period Weeks    Status On-going    Target Date 03/15/21      OT LONG TERM GOAL #7   Title Pt will improve dynamic standing balance to enable pt to feed dogs with modified indep.    Baseline 12/22/2020: Several falls over the last week; pt is inconsistent to use his rollator for dynamic standing tasks.  Would need supv to feed dogs; 02/04/21: pt can feed dogs with modified indep using large scoopers and pitcher of water to pour into dishes on the floor rather than carry dishes full of water or food.    Time 12    Period Weeks    Status Achieved    Target Date 03/15/21  Plan - 02/16/21 1541     Clinical Impression Statement Good tolerance to BUE stretching and distal strengthening.  Pt requested to address handwriting today d/t micrographia.  Pt requires constant cues to utilize lines on paper to maintain letter height; tracing then forming pt's own letters and numbers beside the traced letters was effective to maintain letter height.  Pt will continue to benefit from skilled OT to maximize BUE strength,  coordination, safety, and indep with self care.    OT Occupational Profile and History Detailed Assessment- Review of Records and additional review of physical, cognitive, psychosocial history related to current functional performance    Occupational performance deficits (Please refer to evaluation for details): ADL's;IADL's;Leisure    Body Structure / Function / Physical Skills ADL;Coordination;Endurance;GMC;UE functional use;Balance;Sensation;IADL;Vision;Dexterity;FMC;Strength;Continence;Gait;Mobility    Rehab Potential Good    Clinical Decision Making Several treatment options, min-mod task modification necessary    Comorbidities Affecting Occupational Performance: May have comorbidities impacting occupational performance    Modification or Assistance to Complete Evaluation  Min-Moderate modification of tasks or assist with assess necessary to complete eval    OT Frequency 2x / week    OT Duration 12 weeks    OT Treatment/Interventions Self-care/ADL training;Therapeutic exercise;DME and/or AE instruction;Balance training;Neuromuscular education;Manual Therapy;Visual/perceptual remediation/compensation;Therapeutic activities;Patient/family education    Consulted and Agree with Plan of Care Patient             Patient will benefit from skilled therapeutic intervention in order to improve the following deficits and impairments:   Body Structure / Function / Physical Skills: ADL, Coordination, Endurance, GMC, UE functional use, Balance, Sensation, IADL, Vision, Dexterity, FMC, Strength, Continence, Gait, Mobility       Visit Diagnosis: Muscle weakness (generalized)  Other lack of coordination  Parkinson's disease [facility])    Problem List Patient Active Problem List   Diagnosis Date Noted   Focal seizure ([place]) 05/24/2020   Parkinson's disease ([place])    History of migraine headaches    Migraine syndrome    BPH (benign prostatic hyperplasia)    Depression    Degenerative  spondylolisthesis 05/07/2016   Leta Speller, MS, OTR/L  Darleene Cleaver, OT/L 02/16/2021, 3:41 PM  [place] [address] [place], [place], [address] Phone: [phone]   Fax:  [phone]  Name: Cory [name] MRN: [id number] Date of Birth: Aug 12, 1955

## 2021-02-18 ENCOUNTER — Ambulatory Visit: Payer: Medicare PPO

## 2021-02-23 ENCOUNTER — Encounter: Payer: Medicare PPO | Admitting: Speech Pathology

## 2021-02-23 ENCOUNTER — Ambulatory Visit: Payer: Medicare PPO

## 2021-02-25 ENCOUNTER — Ambulatory Visit: Payer: Medicare PPO

## 2021-02-25 ENCOUNTER — Other Ambulatory Visit: Payer: Self-pay

## 2021-02-25 DIAGNOSIS — R2689 Other abnormalities of gait and mobility: Secondary | ICD-10-CM

## 2021-02-25 DIAGNOSIS — R278 Other lack of coordination: Secondary | ICD-10-CM

## 2021-02-25 DIAGNOSIS — M6281 Muscle weakness (generalized): Secondary | ICD-10-CM

## 2021-02-25 DIAGNOSIS — R2681 Unsteadiness on feet: Secondary | ICD-10-CM

## 2021-02-25 DIAGNOSIS — G20 Parkinson's disease: Secondary | ICD-10-CM

## 2021-02-25 NOTE — Therapy (Signed)
[place] [facility]IN [facility] [address] [place], [place], [address] Phone: [phone]   Fax:  [phone]  Occupational Therapy Treatment  Patient Details  Name: Cory [name] MRN: [id number] Date of Birth: March 06, 1956 No data recorded  Encounter Date: 02/25/2021   OT End of Session - 02/25/21 1248     Visit Number 34    Number of Visits 4    Date for OT Re-Evaluation 03/15/21    Authorization Time Period Reporting period starting 01/05/2021    OT Start Time 1146    OT Stop Time 1233    OT Time Calculation (min) 47 min    Equipment Utilized During Treatment 4 wheeled walker    Activity Tolerance Patient tolerated treatment well    Behavior During Therapy WFL for tasks assessed/performed             Past Medical History:  Diagnosis Date   Arthritis    hips and back   Asthma    as a baby-    GERD (gastroesophageal reflux disease)    rare -    Parkinson's disease ([place])    Pneumonia    age 18   Rash    legs- on going    Past Surgical History:  Procedure Laterality Date   APPENDECTOMY     BACK SURGERY     posterior lumbar spine fusion L4-5   COLONOSCOPY     COLONOSCOPY WITH PROPOFOL N/A 06/06/2017   Procedure: COLONOSCOPY WITH PROPOFOL;  Surgeon: Manya Silvas, MD;  Location: [facility] ENDOSCOPY;  Service: Endoscopy;  Laterality: N/A;   KNEE ARTHROSCOPY Bilateral    ACL   KNEE ARTHROSCOPY W/ ACL RECONSTRUCTION     RADIAL KERATOTOMY     ROTATOR CUFF REPAIR Bilateral    SHOULDER ARTHROSCOPY WITH BICEPS TENDON REPAIR Left 04/16/2019   Procedure: SHOULDER ARTHROSCOPY WITH BICEPS TENDON REPAIR, MINI OPEN SUPERIOR CAPSULAR RECONSTRUCTION, BICEPS TENODESIS;  Surgeon: Leim Fabry, MD;  Location: [facility] ORS;  Service: Orthopedics;  Laterality: Left;    There were no vitals filed for this visit.   Subjective Assessment - 02/25/21 1240     Subjective  "My hands feel like cement."    Patient is accompanied by: Family member     Pertinent History Parkinson's Disease, R sided weakness, falls    Limitations balance, strength, coordination, visual perception, problem solving, processing information    Patient Stated Goals "I want to get stronger.  It frustrates me when I have to ask for help with things like cutting my food or buttoning a button."    Currently in Pain? No/denies    Pain Score 0-No pain    Pain Onset More than a month ago            Occupational Therapy Treatment: Therapeutic Exercise: Facilitated pinch strengthening with use of therapy resistant clothespins to target lateral and 3 point pinch of R/L hands.  Used green, blue, and black pins, with OT adjusting positioning of dowel to facilitate forward flexion and abduction above shoulder level when clipping pins.  Alternated R/L to allow for rest period from fatigued shoulders.  Facilitated hand strengthening with use of hand gripper set at moderate resistance (1 red and 1 green rubber band) to complete 5 sets 10 reps of gripping each hand; alternated reps with rest and passive stretch for bilat wrist and digit extension.  Targeted motor planning with "big" movement patterns with use of EZ board for R/L wrist flex/ext, pron/sup, and simulation  of turning key x3 reps up/down board with each hand.  Intermittent min vc and tactile cues for improving movement quality with larger movements to roll pieces up/down board.  Response to Treatment: Pt reports hands feeling like cement this day.  Responded well to passive stretching and gentle strengthening for bilat grip and pinch, working to improve manipulation of ADL supplies.  Pt continues to require vc and tactile cues for improving quality of movement with focus on big movement patterns.  Pt demonstrated good above shoulder level reach from [place] with flexion and abduction and no reports of pain during clothespin/reaching activity.  Pt reports more easily able to hang clothes on hangers in closet.  Pt will  continue to benefit from skilled OT to address BUE weakness and coordination deficits as they relate to self care performance.     OT Education - 02/25/21 1243     Education Details quality movement patterns with therapeutic exercise, cues for increasing ROM arc for bigger movements    Person(s) Educated Patient;Spouse    Methods Explanation;Verbal cues;Demonstration    Comprehension Verbalized understanding;Need further instruction;Verbal cues required;Returned demonstration              OT Short Term Goals - 02/04/21 1446       OT SHORT TERM GOAL #1   Title Pt will perform BUE HEP with min vc    Baseline Eval: not yet initiated; 11/19/2020: Pt able to perform with min vc; 12/22/20: indep with cane exercises, need to instruct further in theraband exercises; 02/04/21: added to HEP today for elbow, forearm, wrist strengthening, mod vc with handout (ongoing)    Time 6    Period Weeks    Status On-going    Target Date 03/06/21      OT SHORT TERM GOAL #2   Title Pt will report RPE of 4 or better to indicate improved tolerance to basic ADLs.    Baseline Eval: RPE of 6 with basic ADLs; 11/19/2020: RPE of 5 with basic ADLs; 12/22/20: RPE for ADLs fluctuates greatly from 4 on a good day, to 10 on a bad day; 12/31/20: RPE continues to fluctate greatly throughout the week 4-10; 02/04/2021: significant fluctuations consistent with report on 12/31/20.    Time 6    Period Weeks    Status On-going    Target Date 03/06/21      OT SHORT TERM GOAL #3   Title Pt will utilize visual compensation strategies with min vc in order to improve basic and IADL performance.    Baseline Eval: pt reports some diplopia and decreased depth perception which limits ADL/IADL performance; Pt has been instructed in diplopia exercises and requires mod vc for compensation strategies when diplopia is present; 12/22/20: pt is indep with visual exercises and knowing when to allow rest breaks or use compensatory strategies, vs  request caregiver assist for difficult tasks.    Time 6    Period Weeks    Status Achieved    Target Date 12/22/20               OT Long Term Goals - 02/04/21 1451       OT LONG TERM GOAL #1   Title Pt will increase bilat shoulder strength by 1MM grade in order to improve tolerance to UB ADLs/IADLs and reaching activities.    Baseline Eval: R/L shoulder flex/abd 4-/5, R/L shoulder ER 3+/5; stamina is improving but strength grades still same as eval; R/L shoulder flex/abd 4-, L ER 4-, R  ER 3+, L/R IR 4 (pt reports now able to reach easier into overhead kitchen cabinets).    Time 12    Period Weeks    Status On-going    Target Date 03/15/21      OT LONG TERM GOAL #2   Title Pt will manage clothing fasteners and looping belt with modified indep.    Baseline Eval: spouse assists.  Difficulty with tying shoes, looping belt, snapping and buttoning buttons; Using compensatory strategies for shoe tying as pt now has elastic shoe laces.  Pt now managing his own belt with slight extra time.  Increased time to fasten buttons; 12/22/2020: Pt is consistent to loop belt using compensatory strategies, but struggles with buttoning shirts consistently and typically spouse will assist; 02/04/21: remains consistent with looping belt, but extensive time needed for buttons; pt struggles with the motor planning and problem solving needed to manage buttons and spouse typically assists to conserve pt's energy.    Time 12    Period Weeks    Status Partially Met    Target Date 03/15/21      OT LONG TERM GOAL #3   Title Pt will cut food with modified indep.    Baseline Eval: spouse assists to cut food; Indep to cut food; 12/22/2020: Pt now struggles to cut meat; 12/31/20: Pt can cut chicken with min vc to use large/exaggerated sawing motions but spouse cuts steak; 02/04/21:  Pt cuts meat with intermittent min vc for use of exaggerated sawing motions    Time 12    Period Weeks    Status On-going    Target Date  03/15/21      OT LONG TERM GOAL #4   Title Pt will brush hair with R dominant arm with modified indep.    Baseline Eval: Pt states he must hold R arm up with L hand, and often spouse must assist; 11/19/2020: Pt can reach to top and back of head with RUE but arm fatigues quickly with repetition required to brush hair.    Time 12    Period Weeks    Status Achieved    Target Date 12/22/20      OT LONG TERM GOAL #5   Title Pt will increase FOTO score to 62 or better to indicate measureable functional improvement with ADLs/IADLs.    Baseline Eval: FOTO score of 58; 12/22/2020: 57    Time 12    Period Weeks    Status On-going    Target Date 03/15/21      OT LONG TERM GOAL #6   Title Pt will improve time management skills utilizing external compensatory strategies such as calendars, schedules, alarms as needed with min A.    Baseline 12/22/2020: Not yet initiated (spouse reports that pt is taking longer to complete ADLs yet he is not allowing extra time to complete, causing a rush to get out the door); 02/04/21 Pt has new phone and plans to set up alarms to remind him for medication administration, but has not implemented.    Time 12    Period Weeks    Status On-going    Target Date 03/15/21      OT LONG TERM GOAL #7   Title Pt will improve dynamic standing balance to enable pt to feed dogs with modified indep.    Baseline 12/22/2020: Several falls over the last week; pt is inconsistent to use his rollator for dynamic standing tasks.  Would need supv to feed dogs; 02/04/21: pt can feed  dogs with modified indep using large scoopers and pitcher of water to pour into dishes on the floor rather than carry dishes full of water or food.    Time 12    Period Weeks    Status Achieved    Target Date 03/15/21              Plan - 02/25/21 1311     Clinical Impression Statement Pt reports hands feeling like cement this day.  Responded well to passive stretching and gentle strengthening for bilat  grip and pinch, working to improve manipulation of ADL supplies.  Pt continues to require vc and tactile cues for improving quality of movement with focus on big movement patterns.  Pt demonstrated good above shoulder level reach from [place] with flexion and abduction and no reports of pain during clothespin/reaching activity.  Pt reports more easily able to hang clothes on hangers in closet.  Pt will continue to benefit from skilled OT to address BUE weakness and coordination deficits as they relate to self care performance.    OT Occupational Profile and History Detailed Assessment- Review of Records and additional review of physical, cognitive, psychosocial history related to current functional performance    Occupational performance deficits (Please refer to evaluation for details): ADL's;IADL's;Leisure    Body Structure / Function / Physical Skills ADL;Coordination;Endurance;GMC;UE functional use;Balance;Sensation;IADL;Vision;Dexterity;FMC;Strength;Continence;Gait;Mobility    Rehab Potential Good    Clinical Decision Making Several treatment options, min-mod task modification necessary    Comorbidities Affecting Occupational Performance: May have comorbidities impacting occupational performance    Modification or Assistance to Complete Evaluation  Min-Moderate modification of tasks or assist with assess necessary to complete eval    OT Frequency 2x / week    OT Duration 12 weeks    OT Treatment/Interventions Self-care/ADL training;Therapeutic exercise;DME and/or AE instruction;Balance training;Neuromuscular education;Manual Therapy;Visual/perceptual remediation/compensation;Therapeutic activities;Patient/family education    Consulted and Agree with Plan of Care Patient             Patient will benefit from skilled therapeutic intervention in order to improve the following deficits and impairments:   Body Structure / Function / Physical Skills: ADL, Coordination, Endurance, GMC, UE functional  use, Balance, Sensation, IADL, Vision, Dexterity, FMC, Strength, Continence, Gait, Mobility       Visit Diagnosis: Muscle weakness (generalized)  Other lack of coordination  Parkinson's disease [facility])    Problem List Patient Active Problem List   Diagnosis Date Noted   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

## 2021-02-25 NOTE — Therapy (Signed)
[place] MAIN [facility] SERVICES [address] [place], [place], [address] Phone: [phone]   Fax:  [phone]  Physical Therapy Treatment/RECERT/Physical Therapy Progress Note   Dates of reporting period  01/16/21   to   02/25/21   Patient Details  Name: Cory [name] MRN: [id number] Date of Birth: 1955/10/26 Referring Provider (PT): Benay Spice   Encounter Date: 02/25/2021   PT End of Session - 02/25/21 1050     Visit Number 40    Number of Visits 52    Date for PT Re-Evaluation 05/20/21    Authorization Type [employer] PPO    Authorization Time Period Cert 09/07/38-34/74/25    PT Start Time 1100    PT Stop Time 1144    PT Time Calculation (min) 44 min    Equipment Utilized During Treatment Gait belt    Activity Tolerance Patient tolerated treatment well    Behavior During Therapy WFL for tasks assessed/performed             Past Medical History:  Diagnosis Date   Arthritis    hips and back   Asthma    as a baby-    GERD (gastroesophageal reflux disease)    rare -    Parkinson's disease ([place])    Pneumonia    age 89   Rash    legs- on going    Past Surgical History:  Procedure Laterality Date   APPENDECTOMY     BACK SURGERY     posterior lumbar spine fusion L4-5   COLONOSCOPY     COLONOSCOPY WITH PROPOFOL N/A 06/06/2017   Procedure: COLONOSCOPY WITH PROPOFOL;  Surgeon: Manya Silvas, MD;  Location: [facility] ENDOSCOPY;  Service: Endoscopy;  Laterality: N/A;   KNEE ARTHROSCOPY Bilateral    ACL   KNEE ARTHROSCOPY W/ ACL RECONSTRUCTION     RADIAL KERATOTOMY     ROTATOR CUFF REPAIR Bilateral    SHOULDER ARTHROSCOPY WITH BICEPS TENDON REPAIR Left 04/16/2019   Procedure: SHOULDER ARTHROSCOPY WITH BICEPS TENDON REPAIR, MINI OPEN SUPERIOR CAPSULAR RECONSTRUCTION, BICEPS TENODESIS;  Surgeon: Leim Fabry, MD;  Location: [facility] ORS;  Service: Orthopedics;  Laterality: Left;    There were no vitals filed for this  visit.   Subjective Assessment - 02/25/21 1253     Subjective Patient wants to continue therapy sessions, feels like it is helping.    Patient is accompained by: Family member    Pertinent History Patient admitted to hospital 05/23/20 with code stroke and R sided weakness. Imaging negative for acute changes. PMH includes Parkinson's with autonomic instability, asthma, GERD depression, degenerative spondylolisthesis, focal seizures and migraine syndrome. Patient had three ED visits for falls since then. Patient reports when he has the seizures he has pain in his temples and bright flashes in his vision. Last week had three falls in one day.    Limitations Lifting;Standing;Walking;House hold activities    How long can you sit comfortably? sleeps in easy chair.    How long can you stand comfortably? feel unsteady once in standing.    How long can you walk comfortably? difficult to walk during "off period"    Patient Stated Goals stability, have a device that will be most helpful, help to become and stay independent.    Currently in Pain? No/denies                [facility] PT Assessment - 02/25/21 0001       Standardized Balance Assessment  Standardized Balance Assessment Berg Balance Test      Berg Balance Test   Sit to Stand Able to stand without using hands and stabilize independently    Standing Unsupported Able to stand safely 2 minutes    Sitting with Back Unsupported but Feet Supported on Floor or Stool Able to sit safely and securely 2 minutes    Stand to Sit Sits safely with minimal use of hands    Transfers Able to transfer safely, minor use of hands    Standing Unsupported with Eyes Closed Able to stand 10 seconds safely    Standing Unsupported with Feet Together Able to place feet together independently and stand for 1 minute with supervision    From Standing, Reach Forward with Outstretched Arm Can reach confidently >25 cm (10")    From Standing Position, Pick up Object from  Floor Able to pick up shoe safely and easily    From Standing Position, Turn to Look Behind Over each Shoulder Looks behind from both sides and weight shifts well    Turn 360 Degrees Able to turn 360 degrees safely one side only in 4 seconds or less    Standing Unsupported, Alternately Place Feet on Step/Stool Able to stand independently and safely and complete 8 steps in 20 seconds    Standing Unsupported, One Foot in Front Able to plae foot ahead of the other independently and hold 30 seconds    Standing on One Leg Able to lift leg independently and hold 5-10 seconds    Total Score 52      Functional Gait  Assessment   Gait assessed  Yes    Gait Level Surface Walks 20 ft in less than 5.5 sec, no assistive devices, good speed, no evidence for imbalance, normal gait pattern, deviates no more than 6 in outside of the 12 in walkway width.    Change in Gait Speed Able to smoothly change walking speed without loss of balance or gait deviation. Deviate no more than 6 in outside of the 12 in walkway width.    Gait with Horizontal Head Turns Performs head turns smoothly with slight change in gait velocity (eg, minor disruption to smooth gait path), deviates 6-10 in outside 12 in walkway width, or uses an assistive device.    Gait with Vertical Head Turns Performs task with slight change in gait velocity (eg, minor disruption to smooth gait path), deviates 6 - 10 in outside 12 in walkway width or uses assistive device    Gait and Pivot Turn Pivot turns safely in greater than 3 sec and stops with no loss of balance, or pivot turns safely within 3 sec and stops with mild imbalance, requires small steps to catch balance.    Step Over Obstacle Is able to step over 2 stacked shoe boxes taped together (9 in total height) without changing gait speed. No evidence of imbalance.    Gait with Narrow Base of Support Ambulates 7-9 steps.    Gait with Eyes Closed Walks 20 ft, no assistive devices, good speed, no evidence  of imbalance, normal gait pattern, deviates no more than 6 in outside 12 in walkway width. Ambulates 20 ft in less than 7 sec.    Ambulating Backwards Walks 20 ft, uses assistive device, slower speed, mild gait deviations, deviates 6-10 in outside 12 in walkway width.    Steps Alternating feet, must use rail.    Total Score 24  Goals:   Falls: 1 fall  BERG 52 FGA: 24/30  Static stand 5 mins MET able to perform  Ambulate 1 mile walking dog FOTO: 53%   Add goal: LEFS: 44/80 static stand and reach into/out of pocket without LOB   Treatment: Ambulate against pertubation with BTB 6x 86 ft  Pt educated throughout session about proper posture and technique with exercises. Improved exercise technique, movement at target joints, use of target muscles after min to mod verbal, visual, tactile cues.    Patient's condition has the potential to improve in response to therapy. Maximum improvement is yet to be obtained. The anticipated improvement is attainable and reasonable in a generally predictable time.  Patient reports he is not where he wants to be yet. Has had some "off days". Wants to continue therapy     Patient has good progression of goals with decreased frequency of falling, improved balance and improved mobility. He will benefit from one last additional round of therapy recert. New goal of standing and reaching into pocket as well as LEFS added to POC. Patient's condition has the potential to improve in response to therapy. Maximum improvement is yet to be obtained. The anticipated improvement is attainable and reasonable in a generally predictable time. Patient will continue to benefit from skilled PT to address core strengthening, postural exercises, balance, and functional mobility to increase safety and independence with ADLs             PT Education - 02/25/21 1049     Education Details goals, POC    Person(s) Educated Patient    Methods  Explanation;Demonstration;Tactile cues;Verbal cues    Comprehension Verbalized understanding;Returned demonstration;Verbal cues required;Tactile cues required              PT Short Term Goals - 10/21/20 0903       PT SHORT TERM GOAL #1   Title Patient will be independent in home exercise program to improve strength/mobility for better functional independence with ADLs.    Baseline 6/16: HEP to be given next session 7/19: HEP compliant    Time 4    Period Weeks    Status Achieved    Target Date 10/16/20               PT Long Term Goals - 02/25/21 1125       PT LONG TERM GOAL #1   Title Patient will increase FOTO score to equal to or greater than 51%    to demonstrate statistically significant improvement in mobility and quality of life.    Baseline 6/16: 48% 7/19: 57% 8/31: 50% 11/23: 53%    Time 12    Period Weeks    Status Achieved      PT LONG TERM GOAL #2   Title Patient will increase Berg Balance score by > 6 points (34/56)  to demonstrate decreased fall risk during functional activities.    Baseline 6/16: 28/56 7/19: 45/56    Time 12    Period Weeks    Status Achieved      PT LONG TERM GOAL #3   Title Patient will deny any falls over past 4 weeks to demonstrate improved safety awareness at home and work.    Baseline 6/16: multiple falls 7/19: no falls reported 9/7: 3 falls in past month; 2 falls in past month 11/23: 1 fall past month    Time 12    Period Weeks    Status Partially Met    Target Date 05/20/21  PT LONG TERM GOAL #4   Title Patient will increase six minute walk test distance to >1300 for progression to age norm community ambulator and improve gait ability    Baseline 6/16: 945 ft with multiple near falls 7/19: 1495 ft    Time 12    Period Weeks    Status Achieved      PT LONG TERM GOAL #5   Title Patient will increase Berg Balance score by > 6 points (51/56)  to demonstrate decreased fall risk during functional activities.    Baseline  7/19: 45/56 8/31: 49/56; 10/14: 52/56 11/23: 52/56    Time 12    Period Weeks    Status Partially Met    Target Date 05/20/21      Additional Long Term Goals   Additional Long Term Goals Yes      PT LONG TERM GOAL #6   Title Patient will increase Functional Gait Assessment score to >20/30 as to reduce fall risk and improve dynamic gait safety with community ambulation.    Baseline 7/19: 16/30 8/31: 20/30;    Time 12    Period Weeks    Status Achieved      PT LONG TERM GOAL #7   Title Patient will increase Functional Gait Assessment score to >26/30 as to reduce fall risk and improve dynamic gait safety with community ambulation.    Baseline 8/31: 20/30; 10/14: 22/30 11/23: 24/30    Time 12    Period Weeks    Status Partially Met    Target Date 05/20/21      PT LONG TERM GOAL #8   Title Patient will static stand for approximately 5 minutes and perturbations for shooting/teaching classes without loss of balance or requiring object to lean on.    Baseline Unable to perform without leaning on objects; 10/14: Still unable to perform without leaning on objects. 11/23: able to perform 5 minutes    Time 12    Period Weeks    Status Achieved    Target Date 05/20/21      PT LONG TERM GOAL  #9   TITLE Patient will be able to ambulate approximately 1 mile walking dog across changing surfaces such as grass, concrete, and sidewalk without loss of balance.    Baseline 9/7: unable to perform yet; 10/14: Unable 11/23: has not been able to attempt due to recent increase in "off times"    Time 12    Period Weeks    Status On-going    Target Date 05/20/21      PT LONG TERM GOAL  #10   TITLE Patient will bend down and pick up items without losing balance to perform iADLs such as emptying dishwasher and feeding dog.    Baseline 9/7: unable to perform; 10/14: Performing at home without falls or LOB.    Time 12    Period Weeks    Status Achieved      PT LONG TERM GOAL  #11   TITLE Patient will  increase lower extremity functional scale to >60/80 to demonstrate improved functional mobility and increased tolerance with ADLs.    Baseline 11/23: 44    Time 12    Period Weeks    Status New    Target Date 05/20/21      PT LONG TERM GOAL  #12   TITLE Patient will static stand and reach into pocket to grab item without LOB for improved stability and mobility.    Baseline 11/23; LOB reaching  into pocket    Time 12    Period Weeks    Status New    Target Date 05/20/21                   Plan - 02/25/21 1256     Clinical Impression Statement Patient has good progression of goals with decreased frequency of falling, improved balance and improved mobility. He will benefit from one last additional round of therapy recert. New goal of standing and reaching into pocket as well as LEFS added to POC. Patient's condition has the potential to improve in response to therapy. Maximum improvement is yet to be obtained. The anticipated improvement is attainable and reasonable in a generally predictable time. Patient will continue to benefit from skilled PT to address core strengthening, postural exercises, balance, and functional mobility to increase safety and independence with ADLs    Personal Factors and Comorbidities Age;Comorbidity 3+;Fitness;Past/Current Experience;Time since onset of injury/illness/exacerbation;Transportation    Comorbidities Parkinson's with autonomic instability, asthma, GERD depression, degenerative spondylolisthesis, focal seizures and migraine syndrome    Examination-Activity Limitations Bathing;Bed Mobility;Bend;Caring for [employer];Locomotion Level;Lift;Hygiene/Grooming;Squat;Stairs;Stand;Toileting;Transfers    Examination-Participation Restrictions Church;Cleaning;Community Activity;Driving;Meal Prep;Medication Management;Laundry;Shop;Volunteer;Yard Work    [employer]chant [employer] Evolving/Moderate complexity    Rehab Potential  Fair    PT Frequency 2x / week    PT Duration 12 weeks    PT Treatment/Interventions ADLs/Self Care Home Management;Biofeedback;Canalith Repostioning;Cryotherapy;Parrafin;Ultrasound;Traction;Moist Heat;Iontophoresis 17m/ml Dexamethasone;Electrical Stimulation;DME Instruction;Gait training;Stair training;Functional mobility training;Neuromuscular re-education;Balance training;Therapeutic exercise;Therapeutic activities;Patient/family education;Orthotic Fit/Training;Manual techniques;Passive range of motion;Manual lymph drainage;Energy conservation;Splinting;Taping;Vasopneumatic Device;Vestibular;Joint Manipulations;Visual/perceptual remediation/compensation    PT Next Visit Plan Increase cognitive demand/dual tasking during gait, progress thoracic opening and rotaiton exercises, core strengthening, progress dynamic balance and gait training for increased BOS and dual tasking. Add core strengthening to HEP. Add dynadisc tasks with backwards rotational component.    PT Home Exercise Plan No updates today    Consulted and Agree with Plan of Care Patient    Family Member Consulted wife             Patient will benefit from skilled therapeutic intervention in order to improve the following deficits and impairments:  Abnormal gait, Decreased activity tolerance, Decreased balance, Decreased knowledge of precautions, Decreased endurance, Decreased coordination, Decreased cognition, Decreased knowledge of use of DME, Decreased mobility, Decreased safety awareness, Difficulty walking, Decreased strength, Impaired flexibility, Impaired perceived functional ability, Impaired sensation, Impaired tone, Impaired UE functional use, Impaired vision/preception, Improper body mechanics, Postural dysfunction  Visit Diagnosis: Muscle weakness (generalized)  Unsteadiness on feet  Other abnormalities of gait and mobility     Problem List Patient Active Problem List   Diagnosis Date Noted   Focal seizure ([place]  05/24/2020   Parkinson's disease ([place]    History of migraine headaches    Migraine syndrome    BPH (benign prostatic hyperplasia)    Depression    Degenerative spondylolisthesis 05/07/2016    MJanna Arch PT, DPT  02/25/2021, 1:02 PM  [place] [facility] [address] [place] [address]Phone: [phone]  Fax:  [phone] Name: Cory PUGHMRN: [id number] of Birth: [birth year]

## 2021-03-02 ENCOUNTER — Ambulatory Visit: Payer: Medicare PPO

## 2021-03-02 ENCOUNTER — Other Ambulatory Visit: Payer: Self-pay

## 2021-03-02 DIAGNOSIS — R2681 Unsteadiness on feet: Secondary | ICD-10-CM

## 2021-03-02 DIAGNOSIS — G20 Parkinson's disease: Secondary | ICD-10-CM

## 2021-03-02 DIAGNOSIS — R278 Other lack of coordination: Secondary | ICD-10-CM

## 2021-03-02 DIAGNOSIS — M6281 Muscle weakness (generalized): Secondary | ICD-10-CM

## 2021-03-02 DIAGNOSIS — R2689 Other abnormalities of gait and mobility: Secondary | ICD-10-CM

## 2021-03-02 NOTE — Therapy (Signed)
[place] [facility] SERVICES [address] [place], [place], [address] Phone: [phone]   Fax:  [phone]  Physical Therapy Treatment  Patient Details  Name: Cory [name] MRN: [id number] Date of Birth: 1955-05-01 Referring Provider (PT): Benay Spice   Encounter Date: 03/02/2021   PT End of Session - 03/02/21 1208     Visit Number 41    Number of Visits 14    Date for PT Re-Evaluation 05/20/21    Authorization Type [employer] PPO    Authorization Time Period Cert 10/07/08-25/85/27    PT Start Time 1101    PT Stop Time 1145    PT Time Calculation (min) 44 min    Equipment Utilized During Treatment Gait belt    Activity Tolerance Patient tolerated treatment well    Behavior During Therapy WFL for tasks assessed/performed             Past Medical History:  Diagnosis Date   Arthritis    hips and back   Asthma    as a baby-    GERD (gastroesophageal reflux disease)    rare -    Parkinson's disease ([place])    Pneumonia    age 57   Rash    legs- on going    Past Surgical History:  Procedure Laterality Date   APPENDECTOMY     BACK SURGERY     posterior lumbar spine fusion L4-5   COLONOSCOPY     COLONOSCOPY WITH PROPOFOL N/A 06/06/2017   Procedure: COLONOSCOPY WITH PROPOFOL;  Surgeon: Manya Silvas, MD;  Location: [facility] ENDOSCOPY;  Service: Endoscopy;  Laterality: N/A;   KNEE ARTHROSCOPY Bilateral    ACL   KNEE ARTHROSCOPY W/ ACL RECONSTRUCTION     RADIAL KERATOTOMY     ROTATOR CUFF REPAIR Bilateral    SHOULDER ARTHROSCOPY WITH BICEPS TENDON REPAIR Left 04/16/2019   Procedure: SHOULDER ARTHROSCOPY WITH BICEPS TENDON REPAIR, MINI OPEN SUPERIOR CAPSULAR RECONSTRUCTION, BICEPS TENODESIS;  Surgeon: Leim Fabry, MD;  Location: [facility] ORS;  Service: Orthopedics;  Laterality: Left;    There were no vitals filed for this visit.   Subjective Assessment - 03/02/21 1059     Subjective Patient says "balance is shot"  since yesterday, especially when standing still. Reports headache today 6/10.    Patient is accompained by: Family member    Pertinent History Patient admitted to hospital 05/23/20 with code stroke and R sided weakness. Imaging negative for acute changes. PMH includes Parkinson's with autonomic instability, asthma, GERD depression, degenerative spondylolisthesis, focal seizures and migraine syndrome. Patient had three ED visits for falls since then. Patient reports when he has the seizures he has pain in his temples and bright flashes in his vision. Last week had three falls in one day.    Limitations Lifting;Standing;Walking;House hold activities    How long can you sit comfortably? sleeps in easy chair.    How long can you stand comfortably? feel unsteady once in standing.    How long can you walk comfortably? difficult to walk during "off period"    Patient Stated Goals stability, have a device that will be most helpful, help to become and stay independent.             TherEx:  Seated [place] twists 1x20 Seated [place] 2x20, 1 set on firm surface, 1 set on dynadisc Seated rows on dynadisc BTB 1x12  Pallof Press 5-step walkouts x8 each direction, 12.5lbs on cable machine. Cuing for large steps,  keeping toes, hips and shoulders in line. Backwards/Forwards Resisted walking with rope attachment, 22.5lbs out/back x10. Cuing for wide, large steps, re-setting and maintaining core engagement.   Neuro Re-Ed:  Airex balance: Normal BOS, static stance x30sec Narrow BOS, static stance x30sec Normal BOS reaching into pockets and transferring items around x24mn  Ambulating in hall with CGA and no AD x 17m with sequential tasking and command following. Colored items scattered throughout hall at variable heights and pt instructed to pick up items in particular order. Intermittent instruction to quickly stop, quickly turn, remove items from pockets. Pt challenged to pack items in box at end to  simulate carry over to packing grocery bags or suitcases effectively.    Pt educated throughout session about proper posture and technique with exercises. Improved exercise technique, movement at target joints, use of target muscles after min to mod verbal, visual, tactile cues.   Patient presents to PT with complaints of headache and poor balance but gave great effort throughout . Ambulation with multi-tasking and increased cognitive load was progressed today and tolerated well by pt with minimal instances of shuffle stepping. Pt remains challenged by high level gait with cognitive overload and dual tasking as well as maintaining appropriate BOS. Pt will continue to benefit from skilled PT to improve balance, gait, and strength in order to improve functional mobility, safety with ADLs, reduce falls risk, and decrease caregiver burden.                       PT Education - 03/02/21 1207     Education Details exercise technique    Person(s) Educated Patient    Methods Explanation;Demonstration;Tactile cues;Verbal cues    Comprehension Verbal cues required;Returned demonstration;Tactile cues required;Verbalized understanding              PT Short Term Goals - 10/21/20 0903       PT SHORT TERM GOAL #1   Title Patient will be independent in home exercise program to improve strength/mobility for better functional independence with ADLs.    Baseline 6/16: HEP to be given next session 7/19: HEP compliant    Time 4    Period Weeks    Status Achieved    Target Date 10/16/20               PT Long Term Goals - 02/25/21 1125       PT LONG TERM GOAL #1   Title Patient will increase FOTO score to equal to or greater than 51%    to demonstrate statistically significant improvement in mobility and quality of life.    Baseline 6/16: 48% 7/19: 57% 8/31: 50% 11/23: 53%    Time 12    Period Weeks    Status Achieved      PT LONG TERM GOAL #2   Title Patient will  increase Berg Balance score by > 6 points (34/56)  to demonstrate decreased fall risk during functional activities.    Baseline 6/16: 28/56 7/19: 45/56    Time 12    Period Weeks    Status Achieved      PT LONG TERM GOAL #3   Title Patient will deny any falls over past 4 weeks to demonstrate improved safety awareness at home and work.    Baseline 6/16: multiple falls 7/19: no falls reported 9/7: 3 falls in past month; 2 falls in past month 11/23: 1 fall past month    Time 12    Period Weeks  Status Partially Met    Target Date 05/20/21      PT LONG TERM GOAL #4   Title Patient will increase six minute walk test distance to >1300 for progression to age norm community ambulator and improve gait ability    Baseline 6/16: 945 ft with multiple near falls 7/19: 1495 ft    Time 12    Period Weeks    Status Achieved      PT LONG TERM GOAL #5   Title Patient will increase Berg Balance score by > 6 points (51/56)  to demonstrate decreased fall risk during functional activities.    Baseline 7/19: 45/56 8/31: 49/56; 10/14: 52/56 11/23: 52/56    Time 12    Period Weeks    Status Partially Met    Target Date 05/20/21      Additional Long Term Goals   Additional Long Term Goals Yes      PT LONG TERM GOAL #6   Title Patient will increase Functional Gait Assessment score to >20/30 as to reduce fall risk and improve dynamic gait safety with community ambulation.    Baseline 7/19: 16/30 8/31: 20/30;    Time 12    Period Weeks    Status Achieved      PT LONG TERM GOAL #7   Title Patient will increase Functional Gait Assessment score to >26/30 as to reduce fall risk and improve dynamic gait safety with community ambulation.    Baseline 8/31: 20/30; 10/14: 22/30 11/23: 24/30    Time 12    Period Weeks    Status Partially Met    Target Date 05/20/21      PT LONG TERM GOAL #8   Title Patient will static stand for approximately 5 minutes and perturbations for shooting/teaching classes  without loss of balance or requiring object to lean on.    Baseline Unable to perform without leaning on objects; 10/14: Still unable to perform without leaning on objects. 11/23: able to perform 5 minutes    Time 12    Period Weeks    Status Achieved    Target Date 05/20/21      PT LONG TERM GOAL  #9   TITLE Patient will be able to ambulate approximately 1 mile walking dog across changing surfaces such as grass, concrete, and sidewalk without loss of balance.    Baseline 9/7: unable to perform yet; 10/14: Unable 11/23: has not been able to attempt due to recent increase in "off times"    Time 12    Period Weeks    Status On-going    Target Date 05/20/21      PT LONG TERM GOAL  #10   TITLE Patient will bend down and pick up items without losing balance to perform iADLs such as emptying dishwasher and feeding dog.    Baseline 9/7: unable to perform; 10/14: Performing at home without falls or LOB.    Time 12    Period Weeks    Status Achieved      PT LONG TERM GOAL  #11   TITLE Patient will increase lower extremity functional scale to >60/80 to demonstrate improved functional mobility and increased tolerance with ADLs.    Baseline 11/23: 44    Time 12    Period Weeks    Status New    Target Date 05/20/21      PT LONG TERM GOAL  #12   TITLE Patient will static stand and reach into pocket to grab item without  LOB for improved stability and mobility.    Baseline 11/23; LOB reaching into pocket    Time 12    Period Weeks    Status New    Target Date 05/20/21                   Plan - 03/02/21 1208     Clinical Impression Statement Patient presents to PT with complaints of moderate headache and poor balance but gave great effort throughout . Ambulation with multi-tasking and increased cognitive load was progressed today and tolerated well by pt with minimal instances of shuffle stepping. Pt remains challenged by high level gait with cognitive overload and dual tasking as  well as maintaining appropriate BOS. Pt will continue to benefit from skilled PT to improve balance, gait, and strength in order to improve functional mobility, safety with ADLs, reduce falls risk, and decrease caregiver burden.    Personal Factors and Comorbidities Age;Comorbidity 3+;Fitness;Past/Current Experience;Time since onset of injury/illness/exacerbation;Transportation    Comorbidities Parkinson's with autonomic instability, asthma, GERD depression, degenerative spondylolisthesis, focal seizures and migraine syndrome    Examination-Activity Limitations Bathing;Bed Mobility;Bend;Caring for [employer];Locomotion Level;Lift;Hygiene/Grooming;Squat;Stairs;Stand;Toileting;Transfers    Examination-Participation Restrictions Church;Cleaning;Community Activity;Driving;Meal Prep;Medication Management;Laundry;Shop;Volunteer;Yard Work    [employer] Evolving/Moderate complexity    Rehab Potential Fair    PT Frequency 2x / week    PT Duration 12 weeks    PT Treatment/Interventions ADLs/Self Care Home Management;Biofeedback;Canalith Repostioning;Cryotherapy;Parrafin;Ultrasound;Traction;Moist Heat;Iontophoresis 5m/ml Dexamethasone;Electrical Stimulation;DME Instruction;Gait training;Stair training;Functional mobility training;Neuromuscular re-education;Balance training;Therapeutic exercise;Therapeutic activities;Patient/family education;Orthotic Fit/Training;Manual techniques;Passive range of motion;Manual lymph drainage;Energy conservation;Splinting;Taping;Vasopneumatic Device;Vestibular;Joint Manipulations;Visual/perceptual remediation/compensation    PT Next Visit Plan Increase cognitive demand/dual tasking during gait, progress thoracic opening and rotaiton exercises, core strengthening, progress dynamic balance and gait training for increased BOS and dual tasking. Add core strengthening to HEP. Add dynadisc tasks with backwards rotational component.    PT  Home Exercise Plan No updates today    Consulted and Agree with Plan of Care Patient    Family Member Consulted wife             Patient will benefit from skilled therapeutic intervention in order to improve the following deficits and impairments:  Abnormal gait, Decreased activity tolerance, Decreased balance, Decreased knowledge of precautions, Decreased endurance, Decreased coordination, Decreased cognition, Decreased knowledge of use of DME, Decreased mobility, Decreased safety awareness, Difficulty walking, Decreased strength, Impaired flexibility, Impaired perceived functional ability, Impaired sensation, Impaired tone, Impaired UE functional use, Impaired vision/preception, Improper body mechanics, Postural dysfunction  Visit Diagnosis: Other lack of coordination  Unsteadiness on feet  Other abnormalities of gait and mobility     Problem List Patient Active Problem List   Diagnosis Date Noted   [place] 05[place]/2020   [place]inson's disease ([place]    History of migraine headaches    Migraine syndrome    BPH (benign prostatic hyperplasia)    Depression    Degenerative spondylolisthesis 05/07/2016   MArsenio Katz SPT  This entire session was performed under direct supervision and direction of a licensed therapist/therapist assistant . I have personally read, edited and approve of the note as written.  MJanna Arch PT, DPT  03/02/2021, 12:21 PM  [facility] [address] [address]Phone: [phone]  Fax:  [phone] Name: TOSKER AYOUBMRN: [id number] of Birth: [birth year]

## 2021-03-02 NOTE — Therapy (Signed)
[place] MAIN [facility] SERVICES [address] [place], [place], [address] Phone: [phone]   Fax:  [phone]  Occupational Therapy Treatment  Patient Details  Name: Cory [name] MRN: [id number] Date of Birth: 1955/06/23 No data recorded  Encounter Date: 03/02/2021   OT End of Session - 03/02/21 1251     Visit Number 35    Number of Visits 85    Date for OT Re-Evaluation 03/15/21    Authorization Time Period Reporting period starting 01/05/2021    OT Start Time 1146    OT Stop Time 1231    OT Time Calculation (min) 45 min    Equipment Utilized During Treatment 4 wheeled walker    Activity Tolerance Patient tolerated treatment well    Behavior During Therapy WFL for tasks assessed/performed             Past Medical History:  Diagnosis Date   Arthritis    hips and back   Asthma    as a baby-    GERD (gastroesophageal reflux disease)    rare -    Parkinson's disease ([place])    Pneumonia    age 68   Rash    legs- on going    Past Surgical History:  Procedure Laterality Date   APPENDECTOMY     BACK SURGERY     posterior lumbar spine fusion L4-5   COLONOSCOPY     COLONOSCOPY WITH PROPOFOL N/A 06/06/2017   Procedure: COLONOSCOPY WITH PROPOFOL;  Surgeon: Manya Silvas, MD;  Location: [facility] ENDOSCOPY;  Service: Endoscopy;  Laterality: N/A;   KNEE ARTHROSCOPY Bilateral    ACL   KNEE ARTHROSCOPY W/ ACL RECONSTRUCTION     RADIAL KERATOTOMY     ROTATOR CUFF REPAIR Bilateral    SHOULDER ARTHROSCOPY WITH BICEPS TENDON REPAIR Left 04/16/2019   Procedure: SHOULDER ARTHROSCOPY WITH BICEPS TENDON REPAIR, MINI OPEN SUPERIOR CAPSULAR RECONSTRUCTION, BICEPS TENODESIS;  Surgeon: Leim Fabry, MD;  Location: [facility] ORS;  Service: Orthopedics;  Laterality: Left;    There were no vitals filed for this visit.   Subjective Assessment - 03/02/21 1248     Subjective  "He had a good Saturday and we were able to get out and visit some people  from church." (per spouse)    Patient is accompanied by: Family member    Pertinent History Parkinson's Disease, R sided weakness, falls    Limitations balance, strength, coordination, visual perception, problem solving, processing information    Patient Stated Goals "I want to get stronger.  It frustrates me when I have to ask for help with things like cutting my food or buttoning a button."    Currently in Pain? No/denies    Pain Score 0-No pain    Pain Onset More than a month ago            Occupational Therapy Treatment: Therapeutic Exercise: Performed passive stretching to bilat shoulders all planes, working to reduce shoulder stiffness for easier self care performance.  Wall stretch completed for pecs and back, OT providing manual stretch for shoulder retraction against wall, vc for chin tuck and head against wall for optimal posture.  Facilitated hand strengthening with use of hand gripper with moderate resistance (1 red band, 1 green band) for 1 sets 10 reps each hand, then progressed to 2 green bands for another 4 sets 10 reps each hand, completed with rest breaks and alternating [place] activity throughout session.  Facilitated bilat [place] activity with use of  playing cards, with practice shuffling, dealing, flipping cards each hand.  Cued pt to focus on speed with flipping and add a cognitive component to FM activity playing solitaire or sorting cards by suit or numerical order.  Pt sorted cards by suit requiring 2 vc for accuracy.  Worked with [place] [employer] to build 3 structures each hand, extra time needed for picking up small pieces and building the structures, specifically more time needed with L hand.   Response to Treatment: See Plan/clinical impression below.     OT Education - 03/02/21 1249     Education Details [place] exercises for home program (playing card activities)    Person(s) Educated Patient;Spouse    Methods Explanation;Verbal cues;Demonstration    Comprehension  Verbalized understanding;Verbal cues required;Returned demonstration              OT Short Term Goals - 02/04/21 1446       OT SHORT TERM GOAL #1   Title Pt will perform BUE HEP with min vc    Baseline Eval: not yet initiated; 11/19/2020: Pt able to perform with min vc; 12/22/20: indep with cane exercises, need to instruct further in theraband exercises; 02/04/21: added to HEP today for elbow, forearm, wrist strengthening, mod vc with handout (ongoing)    Time 6    Period Weeks    Status On-going    Target Date 03/06/21      OT SHORT TERM GOAL #2   Title Pt will report RPE of 4 or better to indicate improved tolerance to basic ADLs.    Baseline Eval: RPE of 6 with basic ADLs; 11/19/2020: RPE of 5 with basic ADLs; 12/22/20: RPE for ADLs fluctuates greatly from 4 on a good day, to 10 on a bad day; 12/31/20: RPE continues to fluctate greatly throughout the week 4-10; 02/04/2021: significant fluctuations consistent with report on 12/31/20.    Time 6    Period Weeks    Status On-going    Target Date 03/06/21      OT SHORT TERM GOAL #3   Title Pt will utilize visual compensation strategies with min vc in order to improve basic and IADL performance.    Baseline Eval: pt reports some diplopia and decreased depth perception which limits ADL/IADL performance; Pt has been instructed in diplopia exercises and requires mod vc for compensation strategies when diplopia is present; 12/22/20: pt is indep with visual exercises and knowing when to allow rest breaks or use compensatory strategies, vs request caregiver assist for difficult tasks.    Time 6    Period Weeks    Status Achieved    Target Date 12/22/20               OT Long Term Goals - 02/04/21 1451       OT LONG TERM GOAL #1   Title Pt will increase bilat shoulder strength by 1MM grade in order to improve tolerance to UB ADLs/IADLs and reaching activities.    Baseline Eval: R/L shoulder flex/abd 4-/5, R/L shoulder ER 3+/5; stamina is  improving but strength grades still same as eval; R/L shoulder flex/abd 4-, L ER 4-, R ER 3+, L/R IR 4 (pt reports now able to reach easier into overhead kitchen cabinets).    Time 12    Period Weeks    Status On-going    Target Date 03/15/21      OT LONG TERM GOAL #2   Title Pt will manage clothing fasteners and looping belt with modified indep.  Baseline Eval: spouse assists.  Difficulty with tying shoes, looping belt, snapping and buttoning buttons; Using compensatory strategies for shoe tying as pt now has elastic shoe laces.  Pt now managing his own belt with slight extra time.  Increased time to fasten buttons; 12/22/2020: Pt is consistent to loop belt using compensatory strategies, but struggles with buttoning shirts consistently and typically spouse will assist; 02/04/21: remains consistent with looping belt, but extensive time needed for buttons; pt struggles with the motor planning and problem solving needed to manage buttons and spouse typically assists to conserve pt's energy.    Time 12    Period Weeks    Status Partially Met    Target Date 03/15/21      OT LONG TERM GOAL #3   Title Pt will cut food with modified indep.    Baseline Eval: spouse assists to cut food; Indep to cut food; 12/22/2020: Pt now struggles to cut meat; 12/31/20: Pt can cut chicken with min vc to use large/exaggerated sawing motions but spouse cuts steak; 02/04/21:  Pt cuts meat with intermittent min vc for use of exaggerated sawing motions    Time 12    Period Weeks    Status On-going    Target Date 03/15/21      OT LONG TERM GOAL #4   Title Pt will brush hair with R dominant arm with modified indep.    Baseline Eval: Pt states he must hold R arm up with L hand, and often spouse must assist; 11/19/2020: Pt can reach to top and back of head with RUE but arm fatigues quickly with repetition required to brush hair.    Time 12    Period Weeks    Status Achieved    Target Date 12/22/20      OT LONG TERM GOAL  #5   Title Pt will increase FOTO score to 62 or better to indicate measureable functional improvement with ADLs/IADLs.    Baseline Eval: FOTO score of 58; 12/22/2020: 57    Time 12    Period Weeks    Status On-going    Target Date 03/15/21      OT LONG TERM GOAL #6   Title Pt will improve time management skills utilizing external compensatory strategies such as calendars, schedules, alarms as needed with min A.    Baseline 12/22/2020: Not yet initiated (spouse reports that pt is taking longer to complete ADLs yet he is not allowing extra time to complete, causing a rush to get out the door); 02/04/21 Pt has new phone and plans to set up alarms to remind him for medication administration, but has not implemented.    Time 12    Period Weeks    Status On-going    Target Date 03/15/21      OT LONG TERM GOAL #7   Title Pt will improve dynamic standing balance to enable pt to feed dogs with modified indep.    Baseline 12/22/2020: Several falls over the last week; pt is inconsistent to use his rollator for dynamic standing tasks.  Would need supv to feed dogs; 02/04/21: pt can feed dogs with modified indep using large scoopers and pitcher of water to pour into dishes on the floor rather than carry dishes full of water or food.    Time 12    Period Weeks    Status Achieved    Target Date 03/15/21               Plan -  03/02/21 1459     Clinical Impression Statement Pt continues to develop and progress HEP for stretching, strengthening, and [place] for BUEs.  Pt plans to add card handling activities at home as noted in exercises, and verbalizes understanding of how to incorporate cognitive components to card activities.    OT Occupational Profile and History Detailed Assessment- Review of Records and additional review of physical, cognitive, psychosocial history related to current functional performance    Occupational performance deficits (Please refer to evaluation for details):  ADL's;IADL's;Leisure    Body Structure / Function / Physical Skills ADL;Coordination;Endurance;GMC;UE functional use;Balance;Sensation;IADL;Vision;Dexterity;FMC;Strength;Continence;Gait;Mobility    Rehab Potential Good    Clinical Decision Making Several treatment options, min-mod task modification necessary    Comorbidities Affecting Occupational Performance: May have comorbidities impacting occupational performance    Modification or Assistance to Complete Evaluation  Min-Moderate modification of tasks or assist with assess necessary to complete eval    OT Frequency 2x / week    OT Duration 12 weeks    OT Treatment/Interventions Self-care/ADL training;Therapeutic exercise;DME and/or AE instruction;Balance training;Neuromuscular education;Manual Therapy;Visual/perceptual remediation/compensation;Therapeutic activities;Patient/family education    Consulted and Agree with Plan of Care Patient             Patient will benefit from skilled therapeutic intervention in order to improve the following deficits and impairments:   Body Structure / Function / Physical Skills: ADL, Coordination, Endurance, GMC, UE functional use, Balance, Sensation, IADL, Vision, Dexterity, FMC, Strength, Continence, Gait, Mobility       Visit Diagnosis: Muscle weakness (generalized)  Other lack of coordination  Parkinson's disease [facility])    Problem List Patient Active Problem List   Diagnosis Date Noted   Focal seizure ([place]) 05/24/2020   Parkinson's disease ([place])    History of migraine headaches    Migraine syndrome    BPH (benign prostatic hyperplasia)    Depression    Degenerative spondylolisthesis 05/07/2016   Leta Speller, MS, OTR/L  Darleene Cleaver, OT/L 03/02/2021, 3:00 PM  [place] [address] [place], [place], [address] Phone: [phone]   Fax:  [phone]  Name: Cory [name] MRN: [id number] Date of Birth:  [birth year]

## 2021-03-04 ENCOUNTER — Ambulatory Visit: Payer: Medicare PPO | Admitting: Speech Pathology

## 2021-03-04 ENCOUNTER — Ambulatory Visit: Payer: Medicare PPO

## 2021-03-04 ENCOUNTER — Other Ambulatory Visit: Payer: Self-pay

## 2021-03-04 DIAGNOSIS — R2681 Unsteadiness on feet: Secondary | ICD-10-CM

## 2021-03-04 DIAGNOSIS — M6281 Muscle weakness (generalized): Secondary | ICD-10-CM

## 2021-03-04 DIAGNOSIS — R278 Other lack of coordination: Secondary | ICD-10-CM

## 2021-03-04 DIAGNOSIS — G20 Parkinson's disease: Secondary | ICD-10-CM

## 2021-03-04 DIAGNOSIS — R471 Dysarthria and anarthria: Secondary | ICD-10-CM

## 2021-03-04 DIAGNOSIS — R2689 Other abnormalities of gait and mobility: Secondary | ICD-10-CM

## 2021-03-04 DIAGNOSIS — R41841 Cognitive communication deficit: Secondary | ICD-10-CM

## 2021-03-04 NOTE — Therapy (Signed)
[place] MAIN [facility] SERVICES [address] [place], [place], [address] Phone: [phone]   Fax:  [phone]  Occupational Therapy Treatment  Patient Details  Name: Cory [name] MRN: [id number] Date of Birth: [birth year] No data recorded  Encounter Date: 03/04/2021   OT End of Session - 03/04/21 1746     Visit Number 36    Number of Visits 34    Date for OT Re-Evaluation 03/15/21    Authorization Time Period Reporting period starting 01/05/2021    OT Start Time 1145    OT Stop Time 1229    OT Time Calculation (min) 44 min    Equipment Utilized During Treatment 4 wheeled walker    Activity Tolerance Patient tolerated treatment well    Behavior During Therapy WFL for tasks assessed/performed             Past Medical History:  Diagnosis Date   Arthritis    hips and back   Asthma    as a baby-    GERD (gastroesophageal reflux disease)    rare -    Parkinson's disease ([place])    Pneumonia    age 65   Rash    legs- on going    Past Surgical History:  Procedure Laterality Date   APPENDECTOMY     BACK SURGERY     posterior lumbar spine fusion L4-5   COLONOSCOPY     COLONOSCOPY WITH PROPOFOL N/A 06/06/2017   Procedure: COLONOSCOPY WITH PROPOFOL;  Surgeon: Manya Silvas, MD;  Location: [facility] ENDOSCOPY;  Service: Endoscopy;  Laterality: N/A;   KNEE ARTHROSCOPY Bilateral    ACL   KNEE ARTHROSCOPY W/ ACL RECONSTRUCTION     RADIAL KERATOTOMY     ROTATOR CUFF REPAIR Bilateral    SHOULDER ARTHROSCOPY WITH BICEPS TENDON REPAIR Left 04/16/2019   Procedure: SHOULDER ARTHROSCOPY WITH BICEPS TENDON REPAIR, MINI OPEN SUPERIOR CAPSULAR RECONSTRUCTION, BICEPS TENODESIS;  Surgeon: Leim Fabry, MD;  Location: [facility] ORS;  Service: Orthopedics;  Laterality: Left;    There were no vitals filed for this visit.   Subjective Assessment - 03/04/21 1744     Subjective  "I got some bad news in speech today about things I shouldn't be doing  anymore."    Patient is accompanied by: Family member    Pertinent History Parkinson's Disease, R sided weakness, falls    Limitations balance, strength, coordination, visual perception, problem solving, processing information    Patient Stated Goals "I want to get stronger.  It frustrates me when I have to ask for help with things like cutting my food or buttoning a button."    Currently in Pain? No/denies    Pain Score 0-No pain    Pain Onset More than a month ago           Occupational Therapy Treatment: Therapeutic Exercise: Performed passive stretching to bilat shoulders all planes, working to reduce shoulder stiffness for easier self care performance.  Wall stretch completed for pecs and back, OT providing manual stretch for shoulder retraction against wall, vc for chin tuck and head against wall for optimal posture.  Facilitated grip strengthening with use of hand gripper at moderate resistance (1 red band, 1 green band) to complete 5 sets 10 reps of gripping each hand, sets completed throughout session. Facilitated pinch strengthening with use of therapy resistant clothespins to target lateral and 3 point pinch of R/L hand.  Clipped all colors each hand 2 trials for each pinch type.  Intermittent cueing for hand positioning.  Completed wrist and forearm strengthening with 3# dumbbell to complete bilat wrist flex/ext/radial and ulnar deviation, and 4# dumbbell to perform forearm pron/sup x 3 sets 10 reps bilat.  Mod vc for targeting full range of motion and to slow speed to increase quality of movement.     Response to Treatment: See Plan/clinical impression below.    OT Education - 03/04/21 1746     Education Details body mechanics with distal BUE strengthening    Person(s) Educated Patient;Spouse    Methods Explanation;Verbal cues;Demonstration    Comprehension Verbalized understanding;Verbal cues required;Returned demonstration              OT Short Term Goals - 02/04/21  1446       OT SHORT TERM GOAL #1   Title Pt will perform BUE HEP with min vc    Baseline Eval: not yet initiated; 11/19/2020: Pt able to perform with min vc; 12/22/20: indep with cane exercises, need to instruct further in theraband exercises; 02/04/21: added to HEP today for elbow, forearm, wrist strengthening, mod vc with handout (ongoing)    Time 6    Period Weeks    Status On-going    Target Date 03/06/21      OT SHORT TERM GOAL #2   Title Pt will report RPE of 4 or better to indicate improved tolerance to basic ADLs.    Baseline Eval: RPE of 6 with basic ADLs; 11/19/2020: RPE of 5 with basic ADLs; 12/22/20: RPE for ADLs fluctuates greatly from 4 on a good day, to 10 on a bad day; 12/31/20: RPE continues to fluctate greatly throughout the week 4-10; 02/04/2021: significant fluctuations consistent with report on 12/31/20.    Time 6    Period Weeks    Status On-going    Target Date 03/06/21      OT SHORT TERM GOAL #3   Title Pt will utilize visual compensation strategies with min vc in order to improve basic and IADL performance.    Baseline Eval: pt reports some diplopia and decreased depth perception which limits ADL/IADL performance; Pt has been instructed in diplopia exercises and requires mod vc for compensation strategies when diplopia is present; 12/22/20: pt is indep with visual exercises and knowing when to allow rest breaks or use compensatory strategies, vs request caregiver assist for difficult tasks.    Time 6    Period Weeks    Status Achieved    Target Date 12/22/20               OT Long Term Goals - 02/04/21 1451       OT LONG TERM GOAL #1   Title Pt will increase bilat shoulder strength by 1MM grade in order to improve tolerance to UB ADLs/IADLs and reaching activities.    Baseline Eval: R/L shoulder flex/abd 4-/5, R/L shoulder ER 3+/5; stamina is improving but strength grades still same as eval; R/L shoulder flex/abd 4-, L ER 4-, R ER 3+, L/R IR 4 (pt reports now  able to reach easier into overhead kitchen cabinets).    Time 12    Period Weeks    Status On-going    Target Date 03/15/21      OT LONG TERM GOAL #2   Title Pt will manage clothing fasteners and looping belt with modified indep.    Baseline Eval: spouse assists.  Difficulty with tying shoes, looping belt, snapping and buttoning buttons; Using compensatory strategies for shoe tying as pt  now has elastic shoe laces.  Pt now managing his own belt with slight extra time.  Increased time to fasten buttons; 12/22/2020: Pt is consistent to loop belt using compensatory strategies, but struggles with buttoning shirts consistently and typically spouse will assist; 02/04/21: remains consistent with looping belt, but extensive time needed for buttons; pt struggles with the motor planning and problem solving needed to manage buttons and spouse typically assists to conserve pt's energy.    Time 12    Period Weeks    Status Partially Met    Target Date 03/15/21      OT LONG TERM GOAL #3   Title Pt will cut food with modified indep.    Baseline Eval: spouse assists to cut food; Indep to cut food; 12/22/2020: Pt now struggles to cut meat; 12/31/20: Pt can cut chicken with min vc to use large/exaggerated sawing motions but spouse cuts steak; 02/04/21:  Pt cuts meat with intermittent min vc for use of exaggerated sawing motions    Time 12    Period Weeks    Status On-going    Target Date 03/15/21      OT LONG TERM GOAL #4   Title Pt will brush hair with R dominant arm with modified indep.    Baseline Eval: Pt states he must hold R arm up with L hand, and often spouse must assist; 11/19/2020: Pt can reach to top and back of head with RUE but arm fatigues quickly with repetition required to brush hair.    Time 12    Period Weeks    Status Achieved    Target Date 12/22/20      OT LONG TERM GOAL #5   Title Pt will increase FOTO score to 62 or better to indicate measureable functional improvement with ADLs/IADLs.     Baseline Eval: FOTO score of 58; 12/22/2020: 57    Time 12    Period Weeks    Status On-going    Target Date 03/15/21      OT LONG TERM GOAL #6   Title Pt will improve time management skills utilizing external compensatory strategies such as calendars, schedules, alarms as needed with min A.    Baseline 12/22/2020: Not yet initiated (spouse reports that pt is taking longer to complete ADLs yet he is not allowing extra time to complete, causing a rush to get out the door); 02/04/21 Pt has new phone and plans to set up alarms to remind him for medication administration, but has not implemented.    Time 12    Period Weeks    Status On-going    Target Date 03/15/21      OT LONG TERM GOAL #7   Title Pt will improve dynamic standing balance to enable pt to feed dogs with modified indep.    Baseline 12/22/2020: Several falls over the last week; pt is inconsistent to use his rollator for dynamic standing tasks.  Would need supv to feed dogs; 02/04/21: pt can feed dogs with modified indep using large scoopers and pitcher of water to pour into dishes on the floor rather than carry dishes full of water or food.    Time 12    Period Weeks    Status Achieved    Target Date 03/15/21              Plan - 03/04/21 1754     Clinical Impression Statement Pt continues to develop and progress HEP for stretching, strengthening, and [place] for BUEs in  order to maximize indep and efficiency with self care tasks.    OT Occupational Profile and History Detailed Assessment- Review of Records and additional review of physical, cognitive, psychosocial history related to current functional performance    Occupational performance deficits (Please refer to evaluation for details): ADL's;IADL's;Leisure    Body Structure / Function / Physical Skills ADL;Coordination;Endurance;GMC;UE functional use;Balance;Sensation;IADL;Vision;Dexterity;FMC;Strength;Continence;Gait;Mobility    Rehab Potential Good    Clinical Decision  Making Several treatment options, min-mod task modification necessary    Comorbidities Affecting Occupational Performance: May have comorbidities impacting occupational performance    Modification or Assistance to Complete Evaluation  Min-Moderate modification of tasks or assist with assess necessary to complete eval    OT Frequency 2x / week    OT Duration 12 weeks    OT Treatment/Interventions Self-care/ADL training;Therapeutic exercise;DME and/or AE instruction;Balance training;Neuromuscular education;Manual Therapy;Visual/perceptual remediation/compensation;Therapeutic activities;Patient/family education    Consulted and Agree with Plan of Care Patient             Patient will benefit from skilled therapeutic intervention in order to improve the following deficits and impairments:   Body Structure / Function / Physical Skills: ADL, Coordination, Endurance, GMC, UE functional use, Balance, Sensation, IADL, Vision, Dexterity, FMC, Strength, Continence, Gait, Mobility       Visit Diagnosis: Muscle weakness (generalized)  Other lack of coordination  Parkinson's disease [facility])    Problem List Patient Active Problem List   Diagnosis Date Noted   Focal seizure ([place]) 05/24/2020   Parkinson's disease ([place])    History of migraine headaches    Migraine syndrome    BPH (benign prostatic hyperplasia)    Depression    Degenerative spondylolisthesis 05/07/2016   Leta Speller, MS, OTR/L  Darleene Cleaver, OT/L 03/04/2021, 5:54 PM  [facility] [address] [place], [place], [address] Phone: [phone]   Fax:  [phone]  Name: Cory [name] MRN: [id number] Date of Birth: 29-Apr-1955

## 2021-03-04 NOTE — Patient Instructions (Addendum)
Speech Therapy Homework  Work on getting into your phone this week. I'd like to see if you can devise a system to alert yourself about your medication times.  In the meantime, try setting alarms for your med times on your tablet and leave this in the location where you spend most of your time. You could also try asking Alexa to remind you!  Make a list of your typical weekly activities (church, therapy visits, outings like grocery shopping, home exercises, etc)  Make a list of the activities you want to make time for each week (like cleaning your shop, visiting with friends, etc)  You can type your lists or [name] can help you write them.

## 2021-03-04 NOTE — Therapy (Signed)
[place] MAIN [facility] SERVICES [address] [place], [place], [address] Phone: [phone]   Fax:  [phone]  Speech Language Pathology Treatment  Patient Details  Name: Cory [name] MRN: [id number] Date of Birth: [birth year] Referring Provider (SLP): Ramonita Lab III, MD   Encounter Date: 03/04/2021   End of Session - 03/04/21 1137     Visit Number 2    Number of Visits 9    Date for SLP Re-Evaluation 05/05/21    Authorization Type [employer]    SLP Start Time 1000    SLP Stop Time  1100    SLP Time Calculation (min) 60 min    Activity Tolerance Patient tolerated treatment well             Past Medical History:  Diagnosis Date   Arthritis    hips and back   Asthma    as a baby-    GERD (gastroesophageal reflux disease)    rare -    Parkinson's disease ([place])    Pneumonia    age 64   Rash    legs- on going    Past Surgical History:  Procedure Laterality Date   APPENDECTOMY     BACK SURGERY     posterior lumbar spine fusion L4-5   COLONOSCOPY     COLONOSCOPY WITH PROPOFOL N/A 06/06/2017   Procedure: COLONOSCOPY WITH PROPOFOL;  Surgeon: Manya Silvas, MD;  Location: [facility] ENDOSCOPY;  Service: Endoscopy;  Laterality: N/A;   KNEE ARTHROSCOPY Bilateral    ACL   KNEE ARTHROSCOPY W/ ACL RECONSTRUCTION     RADIAL KERATOTOMY     ROTATOR CUFF REPAIR Bilateral    SHOULDER ARTHROSCOPY WITH BICEPS TENDON REPAIR Left 04/16/2019   Procedure: SHOULDER ARTHROSCOPY WITH BICEPS TENDON REPAIR, MINI OPEN SUPERIOR CAPSULAR RECONSTRUCTION, BICEPS TENODESIS;  Surgeon: Leim Fabry, MD;  Location: [facility] ORS;  Service: Orthopedics;  Laterality: Left;    There were no vitals filed for this visit.   Subjective Assessment - 03/04/21 1124     Subjective Pt reports he was frustrated after evaluation.    Patient is accompained by: Family member   Cory [name]   Currently in Pain? No/denies                   ADULT SLP TREATMENT - 03/04/21  1125       General Information   Behavior/Cognition Alert;Cooperative   tangential at times   Patient Positioning Upright in chair      Treatment Provided   Treatment provided Cognitive-Linquistic      Pain Assessment   Pain Assessment No/denies pain      Cognitive-Linquistic Treatment   Treatment focused on Cognition;Patient/family/caregiver education    Skilled Treatment Education re: goals and attempts to elicit pt priorities. At times pt required redirection due to off-topic responses. Discussion with pt, spouse on where the breakdown is occurring with pt forgetting medications. Pt would like to set up alarms on his phone, but has not had a working phone for a month due to being locked out of his account. He is going to attempt to regain access this weekend. Suggested using alternative alerts on pt's tablet or using Alexa until pt able to get his phone working. Discussed benefits of using a schedule and routine to promote independence, as well as to assist with time management and energy conservation. Patient reported feeling "discouraged" by thinking about what he can no longer do, SLP reinforced that hope is to keep  pt's focus on what he can do and what is important to him, and identify strategies to help him be successful.      Assessment / Recommendations / Plan   Plan Continue with current plan of care      Progression Toward Goals   Progression toward goals Progressing toward goals              SLP Education - 03/04/21 1136     Education Details use of alerts, routines, schedules    Person(s) Educated Patient;Spouse    Methods Explanation    Comprehension Verbalized understanding                SLP Long Term Goals - 02/04/21 1220       SLP LONG TERM GOAL #1   Title Pt will use daily schedule to prepare in advance for scheduled activities/outings with min caregiver cues.    Time 4    Period Weeks    Status New    Target Date 05/05/21      SLP LONG TERM  GOAL #2   Title Pt/spouse will report no missed medications over 1 week period using medication management system/strategies.    Time 4    Period Weeks    Status New    Target Date 05/05/21      SLP LONG TERM GOAL #3   Title Pt/spouse will report using strategies to maximize communication effectiveness (reduce distractions, face-to-face conversation, chunking of information, repeat back information, etc).    Time 4    Period Weeks    Status New    Target Date 05/05/21              Plan - 03/04/21 1137     Clinical Impression Statement Cory [name] presents with overall moderate cognitive communication deficits. Education today on focus of ST with concentration on strategies/compensations vs remediation. Pt and spouse appear open and willing to implement compensatory strategies to improve daily function. I recommend brief course of skilled ST to target use of strategies and compensations in specific functional tasks to maximize pt independence, improve safety, minimize frustration and reduce caregiver burden.    Speech Therapy Frequency 2x / week    Duration 4 weeks    Treatment/Interventions Environmental controls;Cueing hierarchy;SLP instruction and feedback;Compensatory strategies;Functional tasks;Internal/external aids;Patient/family education    Potential to Achieve Goals Fair    Potential Considerations Ability to learn/carryover information;Severity of impairments;Previous level of function    SLP Home Exercise Plan to be provided next session    Consulted and Agree with Plan of Care Patient;Family member/caregiver    Family Member Consulted Wife             Patient will benefit from skilled therapeutic intervention in order to improve the following deficits and impairments:   Cognitive communication deficit  Dysarthria and anarthria  Parkinson's disease [facility])    Problem List Patient Active Problem List   Diagnosis Date Noted   Focal seizure ([place]) 05/24/2020    Parkinson's disease ([place])    History of migraine headaches    Migraine syndrome    BPH (benign prostatic hyperplasia)    Depression    Degenerative spondylolisthesis 05/07/2016   Deneise Lever, [place], [place] Speech-Language Pathologist  Aliene Altes, [place] 03/04/2021, 11:47 AM  [place] [address] [place], [place], [address] Phone: [phone]   Fax:  [phone]   Name: Cory [name] MRN: [id number] Date of Birth: 03/05/1956

## 2021-03-04 NOTE — Therapy (Signed)
[place] MAIN [facility] SERVICES [address] [place], [place], [address] Phone: [phone]   Fax:  [phone]  Physical Therapy Treatment  Patient Details  Name: Cory [name] MRN: [id number] Date of Birth: Jul 11, 1955 Referring Provider (PT): Cory [name]   Encounter Date: 03/04/2021   PT End of Session - 03/04/21 1017     Visit Number 42    Number of Visits 54    Date for PT Re-Evaluation 05/20/21    Authorization Type [employer] PPO    Authorization Time Period Cert 09/06/82-66/59/93    PT Start Time 1100    PT Stop Time 1144    PT Time Calculation (min) 44 min    Equipment Utilized During Treatment Gait belt    Activity Tolerance Patient tolerated treatment well    Behavior During Therapy WFL for tasks assessed/performed             Past Medical History:  Diagnosis Date   Arthritis    hips and back   Asthma    as a [name]-    GERD (gastroesophageal reflux disease)    rare -    Parkinson's disease ([place])    Pneumonia    age 64   Rash    legs- on going    Past Surgical History:  Procedure Laterality Date   APPENDECTOMY     BACK SURGERY     posterior lumbar spine fusion L4-5   COLONOSCOPY     COLONOSCOPY WITH PROPOFOL N/A 06/06/2017   Procedure: COLONOSCOPY WITH PROPOFOL;  Surgeon: Manya Silvas, MD;  Location: [facility] ENDOSCOPY;  Service: Endoscopy;  Laterality: N/A;   KNEE ARTHROSCOPY Bilateral    ACL   KNEE ARTHROSCOPY W/ ACL RECONSTRUCTION     RADIAL KERATOTOMY     ROTATOR CUFF REPAIR Bilateral    SHOULDER ARTHROSCOPY WITH BICEPS TENDON REPAIR Left 04/16/2019   Procedure: SHOULDER ARTHROSCOPY WITH BICEPS TENDON REPAIR, MINI OPEN SUPERIOR CAPSULAR RECONSTRUCTION, BICEPS TENODESIS;  Surgeon: Leim Fabry, MD;  Location: [facility] ORS;  Service: Orthopedics;  Laterality: Left;    There were no vitals filed for this visit.   Subjective Assessment - 03/04/21 1218     Subjective ?Patient reports three leans  but no falls since last visit. Has a punching bag at home    Patient is accompained by: Family member    Pertinent History Patient admitted to hospital 05/23/20 with code stroke and R sided weakness. Imaging negative for acute changes. PMH includes Parkinson's with autonomic instability, asthma, GERD depression, degenerative spondylolisthesis, focal seizures and migraine syndrome. Patient had three ED visits for falls since then. Patient reports when he has the seizures he has pain in his temples and bright flashes in his vision. Last week had three falls in one day.    Limitations Lifting;Standing;Walking;House hold activities    How long can you sit comfortably? sleeps in easy chair.    How long can you stand comfortably? feel unsteady once in standing.    How long can you walk comfortably? difficult to walk during "off period"    Patient Stated Goals stability, have a device that will be most helpful, help to become and stay independent.    Currently in Pain? No/denies                Neuro Re-ed:   Airex pad: -NBOS 30 seconds  -horizontal head turns 30 seconds -vertical head turns 30 seconds -eyes closed 30 seconds x 2 trials  Bosu  ball:  -flat side up static stand 60 seconds x 2 trials -round side up: modified lunges 12x each LE; SUE support  -round side up: lateral modified lunge 12x each LE, BUE support    Seated on dynadisc: -cross body punches x2 minutes -hip flexion marches  x 20 each LE -lateral reach down and pick up cone transferring to other side 4x each side  Standing punches with CGA into PT mitts.  -static cross body punches 60 seconds -coordinated jab jab punch, very challenging for coordination aspect of movement 60 seconds -cross body punches in varying pattern with moving target 60 seconds  Ambulate with upside down cone and ball on top for dual task ; one drop of ball 2x 80 ft; very challenging for changing of rooms/colored surfaces with increased besides  of shuffle steppage.   Seated trunk extension 10x over half foam roller    Pt educated throughout session about proper posture and technique with exercises. Improved exercise technique, movement at target joints, use of target muscles after min to mod verbal, visual, tactile cues.    Patient is challenged by dural task interventions this session with utilization of cone and walking. He is highly motivated despite his challenges. Standing with dynamic task of punching is initially difficult but improves with coordination with repetition. Pt will continue to benefit from skilled PT to improve balance, gait, and strength in order to improve functional mobility, safety with ADLs, reduce falls risk, and decrease caregiver burden.                       PT Education - 03/04/21 1015     Education Details exercise technique, body mechanics    Person(s) Educated Patient    Methods Explanation;Demonstration;Tactile cues;Verbal cues    Comprehension Verbalized understanding;Returned demonstration;Verbal cues required;Tactile cues required              PT Short Term Goals - 10/21/20 0903       PT SHORT TERM GOAL #1   Title Patient will be independent in home exercise program to improve strength/mobility for better functional independence with ADLs.    Baseline 6/16: HEP to be given next session 7/19: HEP compliant    Time 4    Period Weeks    Status Achieved    Target Date 10/16/20               PT Long Term Goals - 02/25/21 1125       PT LONG TERM GOAL #1   Title Patient will increase FOTO score to equal to or greater than 51%    to demonstrate statistically significant improvement in mobility and quality of life.    Baseline 6/16: 48% 7/19: 57% 8/31: 50% 11/23: 53%    Time 12    Period Weeks    Status Achieved      PT LONG TERM GOAL #2   Title Patient will increase Berg Balance score by > 6 points (34/56)  to demonstrate decreased fall risk during  functional activities.    Baseline 6/16: 28/56 7/19: 45/56    Time 12    Period Weeks    Status Achieved      PT LONG TERM GOAL #3   Title Patient will deny any falls over past 4 weeks to demonstrate improved safety awareness at home and work.    Baseline 6/16: multiple falls 7/19: no falls reported 9/7: 3 falls in past month; 2 falls in past month 11/23:  1 fall past month    Time 12    Period Weeks    Status Partially Met    Target Date 05/20/21      PT LONG TERM GOAL #4   Title Patient will increase six minute walk test distance to >1300 for progression to age norm community ambulator and improve gait ability    Baseline 6/16: 945 ft with multiple near falls 7/19: 1495 ft    Time 12    Period Weeks    Status Achieved      PT LONG TERM GOAL #5   Title Patient will increase Berg Balance score by > 6 points (51/56)  to demonstrate decreased fall risk during functional activities.    Baseline 7/19: 45/56 8/31: 49/56; 10/14: 52/56 11/23: 52/56    Time 12    Period Weeks    Status Partially Met    Target Date 05/20/21      Additional Long Term Goals   Additional Long Term Goals Yes      PT LONG TERM GOAL #6   Title Patient will increase Functional Gait Assessment score to >20/30 as to reduce fall risk and improve dynamic gait safety with community ambulation.    Baseline 7/19: 16/30 8/31: 20/30;    Time 12    Period Weeks    Status Achieved      PT LONG TERM GOAL #7   Title Patient will increase Functional Gait Assessment score to >26/30 as to reduce fall risk and improve dynamic gait safety with community ambulation.    Baseline 8/31: 20/30; 10/14: 22/30 11/23: 24/30    Time 12    Period Weeks    Status Partially Met    Target Date 05/20/21      PT LONG TERM GOAL #8   Title Patient will static stand for approximately 5 minutes and perturbations for shooting/teaching classes without loss of balance or requiring object to lean on.    Baseline Unable to perform without  leaning on objects; 10/14: Still unable to perform without leaning on objects. 11/23: able to perform 5 minutes    Time 12    Period Weeks    Status Achieved    Target Date 05/20/21      PT LONG TERM GOAL  #9   TITLE Patient will be able to ambulate approximately 1 mile walking dog across changing surfaces such as grass, concrete, and sidewalk without loss of balance.    Baseline 9/7: unable to perform yet; 10/14: Unable 11/23: has not been able to attempt due to recent increase in "off times"    Time 12    Period Weeks    Status On-going    Target Date 05/20/21      PT LONG TERM GOAL  #10   TITLE Patient will bend down and pick up items without losing balance to perform iADLs such as emptying dishwasher and feeding dog.    Baseline 9/7: unable to perform; 10/14: Performing at home without falls or LOB.    Time 12    Period Weeks    Status Achieved      PT LONG TERM GOAL  #11   TITLE Patient will increase lower extremity functional scale to >60/80 to demonstrate improved functional mobility and increased tolerance with ADLs.    Baseline 11/23: 44    Time 12    Period Weeks    Status New    Target Date 05/20/21      PT LONG TERM GOAL  #  12   TITLE Patient will static stand and reach into pocket to grab item without LOB for improved stability and mobility.    Baseline 11/23; LOB reaching into pocket    Time 12    Period Weeks    Status New    Target Date 05/20/21                   Plan - 03/04/21 1219     Clinical Impression Statement Patient is challenged by dural task interventions this session with utilization of cone and walking. He is highly motivated despite his challenges. Standing with dynamic task of punching is initially difficult but improves with coordination with repetition. Pt will continue to benefit from skilled PT to improve balance, gait, and strength in order to improve functional mobility, safety with ADLs, reduce falls risk, and decrease caregiver  burden.    Personal Factors and Comorbidities Age;Comorbidity 3+;Fitness;Past/Current Experience;Time since onset of injury/illness/exacerbation;Transportation    Comorbidities Parkinson's with autonomic instability, asthma, GERD depression, degenerative spondylolisthesis, focal seizures and migraine syndrome    Examination-Activity Limitations Bathing;Bed Mobility;Bend;Caring for [employer];Locomotion Level;Lift;Hygiene/Grooming;Squat;Stairs;Stand;Toileting;Transfers    Examination-Participation Restrictions Church;Cleaning;Community Activity;Driving;Meal Prep;Medication Management;Laundry;Shop;Volunteer;Yard Work    [employer]chant [employer] officer Evolving/Moderate complexity    Rehab Potential Fair    PT Frequency 2x / week    PT Duration 12 weeks    PT Treatment/Interventions ADLs/Self Care Home Management;Biofeedback;Canalith Repostioning;Cryotherapy;Parrafin;Ultrasound;Traction;Moist Heat;Iontophoresis 90m/ml Dexamethasone;Electrical Stimulation;DME Instruction;Gait training;Stair training;Functional mobility training;Neuromuscular re-education;Balance training;Therapeutic exercise;Therapeutic activities;Patient/family education;Orthotic Fit/Training;Manual techniques;Passive range of motion;Manual lymph drainage;Energy conservation;Splinting;Taping;Vasopneumatic Device;Vestibular;Joint Manipulations;Visual/perceptual remediation/compensation    PT Next Visit Plan Increase cognitive demand/dual tasking during gait, progress thoracic opening and rotaiton exercises, core strengthening, progress dynamic balance and gait training for increased BOS and dual tasking. Add core strengthening to HEP. Add dynadisc tasks with backwards rotational component.    PT Home Exercise Plan No updates today    Consulted and Agree with Plan of Care Patient    Family Member Consulted wife             Patient will benefit from skilled therapeutic intervention in order to improve the  following deficits and impairments:  Abnormal gait, Decreased activity tolerance, Decreased balance, Decreased knowledge of precautions, Decreased endurance, Decreased coordination, Decreased cognition, Decreased knowledge of use of DME, Decreased mobility, Decreased safety awareness, Difficulty walking, Decreased strength, Impaired flexibility, Impaired perceived functional ability, Impaired sensation, Impaired tone, Impaired UE functional use, Impaired vision/preception, Improper body mechanics, Postural dysfunction  Visit Diagnosis: Muscle weakness (generalized)  Unsteadiness on feet  Other abnormalities of gait and mobility     Problem List Patient Active Problem List   Diagnosis Date Noted   Focal seizure ([place] 05/24/2020   Parkinson's disease ([place]    History of migraine headaches    Migraine syndrome    BPH (benign prostatic hyperplasia)    Depression    Degenerative spondylolisthesis 05/07/2016    MJanna Arch PT, DPT  03/04/2021, 12:20 PM  [facility] [address] [address]Phone: [phone]  Fax:  [phone] Name: Cory EADSMRN: [id number] of Birth: [birth year]

## 2021-03-09 ENCOUNTER — Ambulatory Visit: Payer: Medicare PPO | Attending: Neurology

## 2021-03-09 ENCOUNTER — Ambulatory Visit: Payer: Medicare PPO

## 2021-03-09 ENCOUNTER — Other Ambulatory Visit: Payer: Self-pay

## 2021-03-09 DIAGNOSIS — R278 Other lack of coordination: Secondary | ICD-10-CM | POA: Diagnosis present

## 2021-03-09 DIAGNOSIS — R2689 Other abnormalities of gait and mobility: Secondary | ICD-10-CM | POA: Diagnosis present

## 2021-03-09 DIAGNOSIS — R41841 Cognitive communication deficit: Secondary | ICD-10-CM | POA: Insufficient documentation

## 2021-03-09 DIAGNOSIS — M6281 Muscle weakness (generalized): Secondary | ICD-10-CM | POA: Diagnosis present

## 2021-03-09 DIAGNOSIS — G20 Parkinson's disease: Secondary | ICD-10-CM | POA: Diagnosis present

## 2021-03-09 DIAGNOSIS — R2681 Unsteadiness on feet: Secondary | ICD-10-CM | POA: Diagnosis present

## 2021-03-09 NOTE — Therapy (Signed)
[place] MAIN [facility] SERVICES [address] [place], [place], [address] Phone: [phone]   Fax:  [phone]  Occupational Therapy Treatment  Patient Details  Name: Cory [name] MRN: [id number] Date of Birth: 1955/10/13 No data recorded  Encounter Date: 03/09/2021   OT End of Session - 03/09/21 1254     Visit Number 37    Number of Visits 40    Date for OT Re-Evaluation 03/15/21    Authorization Time Period Reporting period starting 01/05/2021    OT Start Time 1145    OT Stop Time 61    OT Time Calculation (min) 45 min    Equipment Utilized During Treatment 4 wheeled walker    Activity Tolerance Patient tolerated treatment well    Behavior During Therapy WFL for tasks assessed/performed             Past Medical History:  Diagnosis Date   Arthritis    hips and back   Asthma    as a baby-    GERD (gastroesophageal reflux disease)    rare -    Parkinson's disease ([place])    Pneumonia    age 65   Rash    legs- on going    Past Surgical History:  Procedure Laterality Date   APPENDECTOMY     BACK SURGERY     posterior lumbar spine fusion L4-5   COLONOSCOPY     COLONOSCOPY WITH PROPOFOL N/A 06/06/2017   Procedure: COLONOSCOPY WITH PROPOFOL;  Surgeon: Manya Silvas, MD;  Location: [facility] ENDOSCOPY;  Service: Endoscopy;  Laterality: N/A;   KNEE ARTHROSCOPY Bilateral    ACL   KNEE ARTHROSCOPY W/ ACL RECONSTRUCTION     RADIAL KERATOTOMY     ROTATOR CUFF REPAIR Bilateral    SHOULDER ARTHROSCOPY WITH BICEPS TENDON REPAIR Left 04/16/2019   Procedure: SHOULDER ARTHROSCOPY WITH BICEPS TENDON REPAIR, MINI OPEN SUPERIOR CAPSULAR RECONSTRUCTION, BICEPS TENODESIS;  Surgeon: Leim Fabry, MD;  Location: [facility] ORS;  Service: Orthopedics;  Laterality: Left;    There were no vitals filed for this visit.   Subjective Assessment - 03/09/21 1251     Subjective  "I plan to set aside time to work on my arms and hands every day when I'm  discharged."    Patient is accompanied by: Family member    Pertinent History Parkinson's Disease, R sided weakness, falls    Limitations balance, strength, coordination, visual perception, problem solving, processing information    Patient Stated Goals "I want to get stronger.  It frustrates me when I have to ask for help with things like cutting my food or buttoning a button."    Currently in Pain? No/denies    Pain Score 0-No pain    Pain Onset More than a month ago            Occupational Therapy Treatment: Self Care: Practiced cutting skills with butter knife, fork, and red putty, requiring intermittent cues for use of bigger sawing maneuvers rather than pushing with the knife.  Improved  carry over with techniques this session.   Therapeutic Exercise: Facilitated bilat [place] with deck of cards; pt attempted shuffling x2 with multiple cards spilled on the floor each time.  Practiced dealing and flipping cards in each hand, and sorting cards by suit with 100% accuracy today without vc.  HEP reviewed with pt for BUE stretching, strengthening, and [place] tasks.  Pt requests another printout for all.  Will provide for d/c next visit.  Response to Treatment: See Plan/clinical impression below.   OT Education - 03/09/21 1253     Education Details HEP    Person(s) Educated Patient;Spouse    Methods Explanation;Verbal cues;Demonstration    Comprehension Verbalized understanding;Verbal cues required;Returned demonstration              OT Short Term Goals - 02/04/21 1446       OT SHORT TERM GOAL #1   Title Pt will perform BUE HEP with min vc    Baseline Eval: not yet initiated; 11/19/2020: Pt able to perform with min vc; 12/22/20: indep with cane exercises, need to instruct further in theraband exercises; 02/04/21: added to HEP today for elbow, forearm, wrist strengthening, mod vc with handout (ongoing)    Time 6    Period Weeks    Status On-going    Target Date 03/06/21      OT  SHORT TERM GOAL #2   Title Pt will report RPE of 4 or better to indicate improved tolerance to basic ADLs.    Baseline Eval: RPE of 6 with basic ADLs; 11/19/2020: RPE of 5 with basic ADLs; 12/22/20: RPE for ADLs fluctuates greatly from 4 on a good day, to 10 on a bad day; 12/31/20: RPE continues to fluctate greatly throughout the week 4-10; 02/04/2021: significant fluctuations consistent with report on 12/31/20.    Time 6    Period Weeks    Status On-going    Target Date 03/06/21      OT SHORT TERM GOAL #3   Title Pt will utilize visual compensation strategies with min vc in order to improve basic and IADL performance.    Baseline Eval: pt reports some diplopia and decreased depth perception which limits ADL/IADL performance; Pt has been instructed in diplopia exercises and requires mod vc for compensation strategies when diplopia is present; 12/22/20: pt is indep with visual exercises and knowing when to allow rest breaks or use compensatory strategies, vs request caregiver assist for difficult tasks.    Time 6    Period Weeks    Status Achieved    Target Date 12/22/20               OT Long Term Goals - 02/04/21 1451       OT LONG TERM GOAL #1   Title Pt will increase bilat shoulder strength by 1MM grade in order to improve tolerance to UB ADLs/IADLs and reaching activities.    Baseline Eval: R/L shoulder flex/abd 4-/5, R/L shoulder ER 3+/5; stamina is improving but strength grades still same as eval; R/L shoulder flex/abd 4-, L ER 4-, R ER 3+, L/R IR 4 (pt reports now able to reach easier into overhead kitchen cabinets).    Time 12    Period Weeks    Status On-going    Target Date 03/15/21      OT LONG TERM GOAL #2   Title Pt will manage clothing fasteners and looping belt with modified indep.    Baseline Eval: spouse assists.  Difficulty with tying shoes, looping belt, snapping and buttoning buttons; Using compensatory strategies for shoe tying as pt now has elastic shoe laces.  Pt  now managing his own belt with slight extra time.  Increased time to fasten buttons; 12/22/2020: Pt is consistent to loop belt using compensatory strategies, but struggles with buttoning shirts consistently and typically spouse will assist; 02/04/21: remains consistent with looping belt, but extensive time needed for buttons; pt struggles with the motor planning and problem  solving needed to manage buttons and spouse typically assists to conserve pt's energy.    Time 12    Period Weeks    Status Partially Met    Target Date 03/15/21      OT LONG TERM GOAL #3   Title Pt will cut food with modified indep.    Baseline Eval: spouse assists to cut food; Indep to cut food; 12/22/2020: Pt now struggles to cut meat; 12/31/20: Pt can cut chicken with min vc to use large/exaggerated sawing motions but spouse cuts steak; 02/04/21:  Pt cuts meat with intermittent min vc for use of exaggerated sawing motions    Time 12    Period Weeks    Status On-going    Target Date 03/15/21      OT LONG TERM GOAL #4   Title Pt will brush hair with R dominant arm with modified indep.    Baseline Eval: Pt states he must hold R arm up with L hand, and often spouse must assist; 11/19/2020: Pt can reach to top and back of head with RUE but arm fatigues quickly with repetition required to brush hair.    Time 12    Period Weeks    Status Achieved    Target Date 12/22/20      OT LONG TERM GOAL #5   Title Pt will increase FOTO score to 62 or better to indicate measureable functional improvement with ADLs/IADLs.    Baseline Eval: FOTO score of 58; 12/22/2020: 57    Time 12    Period Weeks    Status On-going    Target Date 03/15/21      OT LONG TERM GOAL #6   Title Pt will improve time management skills utilizing external compensatory strategies such as calendars, schedules, alarms as needed with min A.    Baseline 12/22/2020: Not yet initiated (spouse reports that pt is taking longer to complete ADLs yet he is not allowing extra  time to complete, causing a rush to get out the door); 02/04/21 Pt has new phone and plans to set up alarms to remind him for medication administration, but has not implemented.    Time 12    Period Weeks    Status On-going    Target Date 03/15/21      OT LONG TERM GOAL #7   Title Pt will improve dynamic standing balance to enable pt to feed dogs with modified indep.    Baseline 12/22/2020: Several falls over the last week; pt is inconsistent to use his rollator for dynamic standing tasks.  Would need supv to feed dogs; 02/04/21: pt can feed dogs with modified indep using large scoopers and pitcher of water to pour into dishes on the floor rather than carry dishes full of water or food.    Time 12    Period Weeks    Status Achieved    Target Date 03/15/21              Plan - 03/09/21 1623     Clinical Impression Statement Discussed anticipated discharge visit next visit.  HEP reviewed with pt for BUE stretching, strengthening, and [place] tasks.  Pt requests another printout for all.  Will provide for d/c next visit.  Pt plans to set out time each day for working on [facility], stretching, or strengthening, alternating days.  Also made recommendation for pt to look into Silver Sneakers at discharge.  Assisted pt to locate nearby [employer].  Pt/spouse receptive.  Anticipate OT  d/c next visit.    OT Occupational Profile and History Problem Focused Assessment - Including review of records relating to presenting problem    Occupational performance deficits (Please refer to evaluation for details): ADL's;IADL's;Leisure    Body Structure / Function / Physical Skills ADL;Coordination;Endurance;GMC;UE functional use;Balance;Sensation;IADL;Vision;Dexterity;FMC;Strength;Continence;Gait;Mobility    Rehab Potential Good    Clinical Decision Making Several treatment options, min-mod task modification necessary    Comorbidities Affecting Occupational Performance: May have comorbidities impacting  occupational performance    Modification or Assistance to Complete Evaluation  Min-Moderate modification of tasks or assist with assess necessary to complete eval    OT Frequency 2x / week    OT Duration 12 weeks    OT Treatment/Interventions Self-care/ADL training;Therapeutic exercise;DME and/or AE instruction;Balance training;Neuromuscular education;Manual Therapy;Visual/perceptual remediation/compensation;Therapeutic activities;Patient/family education    Consulted and Agree with Plan of Care Patient             Patient will benefit from skilled therapeutic intervention in order to improve the following deficits and impairments:   Body Structure / Function / Physical Skills: ADL, Coordination, Endurance, GMC, UE functional use, Balance, Sensation, IADL, Vision, Dexterity, FMC, Strength, Continence, Gait, Mobility       Visit Diagnosis: Muscle weakness (generalized)  Other lack of coordination  Parkinson's disease [facility])    Problem List Patient Active Problem List   Diagnosis Date Noted   Focal seizure ([place]) 05/24/2020   Parkinson's disease ([place])    History of migraine headaches    Migraine syndrome    BPH (benign prostatic hyperplasia)    Depression    Degenerative spondylolisthesis 05/07/2016   Leta Speller, MS, OTR/L  Darleene Cleaver, OT 03/09/2021, 4:23 PM  [place] [address] [place], [place], [address] Phone: [phone]   Fax:  [phone]  Name: Cory [name] MRN: [id number] Date of Birth: 1956/03/16

## 2021-03-09 NOTE — Therapy (Signed)
[place] MAIN [facility] SERVICES [address] [place], [place], [address] Phone: [phone]   Fax:  [phone]  Physical Therapy Treatment  Patient Details  Name: Cory [name] MRN: [id number] Date of Birth: 03/26/1956 Referring Provider (PT): Benay Spice   Encounter Date: 03/09/2021   PT End of Session - 03/09/21 1105     Visit Number 69    Number of Visits 98    Date for PT Re-Evaluation 05/20/21    Authorization Type [employer] P[employer]    Authorization Time Period Cert 0/8/81-02/02/58    PT Start Time 1100    PT Stop Time 1144    PT Time Calculation (min) 44 min    Equipment Utilized During Treatment Gait belt    Activity Tolerance Patient tolerated treatment well    Behavior During Therapy WFL for tasks assessed/performed             Past Medical History:  Diagnosis Date   Arthritis    hips and back   Asthma    as a baby-    GERD (gastroesophageal reflux disease)    rare -    Parkinson's disease ([place])    Pneumonia    age 32   Rash    legs- on going    Past Surgical History:  Procedure Laterality Date   APPENDECTOMY     BACK SURGERY     posterior lumbar spine fusion L4-5   COLONOSCOPY     COLONOSCOPY WITH PROPOFOL N/A 06/06/2017   Procedure: COLONOSCOPY WITH PROPOFOL;  Surgeon: Manya Silvas, MD;  Location: [facility] ENDOSCOPY;  Service: Endoscopy;  Laterality: N/A;   KNEE ARTHROSCOPY Bilateral    ACL   KNEE ARTHROSCOPY W/ ACL RECONSTRUCTION     RADIAL KERATOTOMY     ROTATOR CUFF REPAIR Bilateral    SHOULDER ARTHROSCOPY WITH BICEPS TENDON REPAIR Left 04/16/2019   Procedure: SHOULDER ARTHROSCOPY WITH BICEPS TENDON REPAIR, MINI OPEN SUPERIOR CAPSULAR RECONSTRUCTION, BICEPS TENODESIS;  Surgeon: Leim Fabry, MD;  Location: [facility] ORS;  Service: Orthopedics;  Laterality: Left;    There were no vitals filed for this visit.   Subjective Assessment - 03/09/21 1104     Subjective Patient reports today is not a  good balance day. No falls but had multiple "leans".    Patient is accompained by: Family member    Pertinent History Patient admitted to hospital 05/23/20 with code stroke and R sided weakness. Imaging negative for acute changes. PMH includes Parkinson's with autonomic instability, asthma, GERD depression, degenerative spondylolisthesis, focal seizures and migraine syndrome. Patient had three ED visits for falls since then. Patient reports when he has the seizures he has pain in his temples and bright flashes in his vision. Last week had three falls in one day.    Limitations Lifting;Standing;Walking;House hold activities    How long can you sit comfortably? sleeps in easy chair.    How long can you stand comfortably? feel unsteady once in standing.    How long can you walk comfortably? difficult to walk during "off period"    Patient Stated Goals stability, have a device that will be most helpful, help to become and stay independent.    Currently in Pain? No/denies                    Neuro Re-ed:    Standing reach into pocket 10x each arm Static stand PVC pipe 10x overhead raise, 10x chest press   Four cone:  green to yellow fast quick pace, yellow to orange standard walking pace, orange to red slow exaggerated motions with stomp at each cone x 10 trials    Ambulate with upside down cone and ball on top for dual task ; three drops of ball 2x 160 ft; very challenging for changing of rooms/colored surfaces with increased besides of shuffle steppage.   Squat and pick up cone x4 trials with focus on widening BOS     TherEx: Sit to stand 10x BTB hamstring curl 15x each LE BTB adduction 12x each LE BTB around bilateral ankles: alternating LAQ  Forward trunk flexion reach to floor and then reach upwards in y direction 10x   TRX hip extension posterior lunge modified 8x each LE; very challenging and unstable.    Pt educated throughout session about proper posture and technique  with exercises. Improved exercise technique, movement at target joints, use of target muscles after min to mod verbal, visual, tactile cues.   Patient is improving with functional dynamic mobility however dual task interventions are highly challenging for patient at this time. Patient is highly motivated throughout session. Dual task ambulation is improving with duration but still has frequent needs for reset.Pt will continue to benefit from skilled PT to improve balance, gait, and strength in order to improve functional mobility, safety with ADLs, reduce falls risk, and decrease caregiver burden.                 PT Education - 03/09/21 1105     Education Details exercise technique, body mechanics    Person(s) Educated Patient    Methods Explanation;Demonstration;Tactile cues;Verbal cues    Comprehension Verbalized understanding;Returned demonstration;Verbal cues required;Tactile cues required              PT Short Term Goals - 10/21/20 0903       PT SHORT TERM GOAL #1   Title Patient will be independent in home exercise program to improve strength/mobility for better functional independence with ADLs.    Baseline 6/16: HEP to be given next session 7/19: HEP compliant    Time 4    Period Weeks    Status Achieved    Target Date 10/16/20               PT Long Term Goals - 02/25/21 1125       PT LONG TERM GOAL #1   Title Patient will increase FOTO score to equal to or greater than 51%    to demonstrate statistically significant improvement in mobility and quality of life.    Baseline 6/16: 48% 7/19: 57% 8/31: 50% 11/23: 53%    Time 12    Period Weeks    Status Achieved      PT LONG TERM GOAL #2   Title Patient will increase Berg Balance score by > 6 points (34/56)  to demonstrate decreased fall risk during functional activities.    Baseline 6/16: 28/56 7/19: 45/56    Time 12    Period Weeks    Status Achieved      PT LONG TERM GOAL #3   Title Patient  will deny any falls over past 4 weeks to demonstrate improved safety awareness at home and work.    Baseline 6/16: multiple falls 7/19: no falls reported 9/7: 3 falls in past month; 2 falls in past month 11/23: 1 fall past month    Time 12    Period Weeks    Status Partially Met    Target  Date 05/20/21      PT LONG TERM GOAL #4   Title Patient will increase six minute walk test distance to >1300 for progression to age norm community ambulator and improve gait ability    Baseline 6/16: 945 ft with multiple near falls 7/19: 1495 ft    Time 12    Period Weeks    Status Achieved      PT LONG TERM GOAL #5   Title Patient will increase Berg Balance score by > 6 points (51/56)  to demonstrate decreased fall risk during functional activities.    Baseline 7/19: 45/56 8/31: 49/56; 10/14: 52/56 11/23: 52/56    Time 12    Period Weeks    Status Partially Met    Target Date 05/20/21      Additional Long Term Goals   Additional Long Term Goals Yes      PT LONG TERM GOAL #6   Title Patient will increase Functional Gait Assessment score to >20/30 as to reduce fall risk and improve dynamic gait safety with community ambulation.    Baseline 7/19: 16/30 8/31: 20/30;    Time 12    Period Weeks    Status Achieved      PT LONG TERM GOAL #7   Title Patient will increase Functional Gait Assessment score to >26/30 as to reduce fall risk and improve dynamic gait safety with community ambulation.    Baseline 8/31: 20/30; 10/14: 22/30 11/23: 24/30    Time 12    Period Weeks    Status Partially Met    Target Date 05/20/21      PT LONG TERM GOAL #8   Title Patient will static stand for approximately 5 minutes and perturbations for shooting/teaching classes without loss of balance or requiring object to lean on.    Baseline Unable to perform without leaning on objects; 10/14: Still unable to perform without leaning on objects. 11/23: able to perform 5 minutes    Time 12    Period Weeks    Status  Achieved    Target Date 05/20/21      PT LONG TERM GOAL  #9   TITLE Patient will be able to ambulate approximately 1 mile walking dog across changing surfaces such as grass, concrete, and sidewalk without loss of balance.    Baseline 9/7: unable to perform yet; 10/14: Unable 11/23: has not been able to attempt due to recent increase in "off times"    Time 12    Period Weeks    Status On-going    Target Date 05/20/21      PT LONG TERM GOAL  #10   TITLE Patient will bend down and pick up items without losing balance to perform iADLs such as emptying dishwasher and feeding dog.    Baseline 9/7: unable to perform; 10/14: Performing at home without falls or LOB.    Time 12    Period Weeks    Status Achieved      PT LONG TERM GOAL  #11   TITLE Patient will increase lower extremity functional scale to >60/80 to demonstrate improved functional mobility and increased tolerance with ADLs.    Baseline 11/23: 44    Time 12    Period Weeks    Status New    Target Date 05/20/21      PT LONG TERM GOAL  #12   TITLE Patient will static stand and reach into pocket to grab item without LOB for improved stability and mobility.  Baseline 11/23; LOB reaching into pocket    Time 12    Period Weeks    Status New    Target Date 05/20/21                   Plan - 03/09/21 1306     Clinical Impression Statement Patient is improving with functional dynamic mobility however dual task interventions are highly challenging for patient at this time. Patient is highly motivated throughout session. Dual task ambulation is improving with duration but still has frequent needs for reset.Pt will continue to benefit from skilled PT to improve balance, gait, and strength in order to improve functional mobility, safety with ADLs, reduce falls risk, and decrease caregiver burden.    Personal Factors and Comorbidities Age;Comorbidity 3+;Fitness;Past/Current Experience;Time since onset of  injury/illness/exacerbation;Transportation    Comorbidities Parkinson's with autonomic instability, asthma, GERD depression, degenerative spondylolisthesis, focal seizures and migraine syndrome    Examination-Activity Limitations Bathing;Bed Mobility;Bend;Caring for [employer];Locomotion Level;Lift;Hygiene/Grooming;Squat;Stairs;Stand;Toileting;Transfers    Examination-Participation Restrictions Church;Cleaning;Community Activity;Driving;Meal Prep;Medication Management;Laundry;Shop;Volunteer;Yard Work    Merchant [employer] officer Evolving/Moderate complexity    Rehab Potential Fair    PT Frequency 2x / week    PT Duration 12 weeks    PT Treatment/Interventions ADLs/Self Care Home Management;Biofeedback;Canalith Repostioning;Cryotherapy;Parrafin;Ultrasound;Traction;Moist Heat;Iontophoresis 76m/ml Dexamethasone;Electrical Stimulation;DME Instruction;Gait training;Stair training;Functional mobility training;Neuromuscular re-education;Balance training;Therapeutic exercise;Therapeutic activities;Patient/family education;Orthotic Fit/Training;Manual techniques;Passive range of motion;Manual lymph drainage;Energy conservation;Splinting;Taping;Vasopneumatic Device;Vestibular;Joint Manipulations;Visual/perceptual remediation/compensation    PT Next Visit Plan Increase cognitive demand/dual tasking during gait, progress thoracic opening and rotaiton exercises, core strengthening, progress dynamic balance and gait training for increased BOS and dual tasking. Add core strengthening to HEP. Add dynadisc tasks with backwards rotational component.    PT Home Exercise Plan No updates today    Consulted and Agree with Plan of Care Patient    Family Member Consulted wife             Patient will benefit from skilled therapeutic intervention in order to improve the following deficits and impairments:  Abnormal gait, Decreased activity tolerance, Decreased balance, Decreased  knowledge of precautions, Decreased endurance, Decreased coordination, Decreased cognition, Decreased knowledge of use of DME, Decreased mobility, Decreased safety awareness, Difficulty walking, Decreased strength, Impaired flexibility, Impaired perceived functional ability, Impaired sensation, Impaired tone, Impaired UE functional use, Impaired vision/preception, Improper body mechanics, Postural dysfunction  Visit Diagnosis: Unsteadiness on feet  Other abnormalities of gait and mobility  Muscle weakness (generalized)     Problem List Patient Active Problem List   Diagnosis Date Noted   Focal seizure ([place] 05/24/2020   Parkinson's disease ([place]    History of migraine headaches    Migraine syndrome    BPH (benign prostatic hyperplasia)    Depression    Degenerative spondylolisthesis 05/07/2016    MJanna Arch PT, DPT  03/09/2021, 1:07 PM  [place] [place] [address]Phone: [phone]  Fax:  [phone] Name: Cory HEACOCKMRN: [id number] of Birth: 7July 17, 1957

## 2021-03-11 ENCOUNTER — Ambulatory Visit: Payer: Medicare PPO | Admitting: Speech Pathology

## 2021-03-11 ENCOUNTER — Other Ambulatory Visit: Payer: Self-pay

## 2021-03-11 ENCOUNTER — Ambulatory Visit: Payer: Medicare PPO

## 2021-03-11 DIAGNOSIS — R41841 Cognitive communication deficit: Secondary | ICD-10-CM

## 2021-03-11 DIAGNOSIS — R278 Other lack of coordination: Secondary | ICD-10-CM

## 2021-03-11 DIAGNOSIS — R2681 Unsteadiness on feet: Secondary | ICD-10-CM | POA: Diagnosis not present

## 2021-03-11 DIAGNOSIS — G20 Parkinson's disease: Secondary | ICD-10-CM

## 2021-03-11 DIAGNOSIS — M6281 Muscle weakness (generalized): Secondary | ICD-10-CM

## 2021-03-11 DIAGNOSIS — R2689 Other abnormalities of gait and mobility: Secondary | ICD-10-CM

## 2021-03-11 NOTE — Patient Instructions (Signed)
Functional phrases: Read LOUD twice a day.   Come here, Zeke!  Where are we gonna eat lunch?  That's not what I'm saying.  I'm not griping!  Come help me get tucked in.  Did I take my medicine?  Thank you!  Zipporah Plants, how was your night?  What are you doing?  Where are you going?

## 2021-03-11 NOTE — Therapy (Signed)
[place] MAIN [facility] SERVICES [address] [place], [place], [address] Phone: [phone]   Fax:  [phone]  Physical Therapy Treatment  Patient Details  Name: Cory [name] MRN: [id number] Date of Birth: 18-Jan-1956 Referring Provider (PT): Benay Spice   Encounter Date: 03/11/2021   PT End of Session - 03/11/21 1036     Visit Number 94    Number of Visits 33    Date for PT Re-Evaluation 05/20/21    Authorization Type [employer] PPO    Authorization Time Period Cert 09/11/60-95/28/41    PT Start Time 1101    PT Stop Time 1145    PT Time Calculation (min) 44 min    Equipment Utilized During Treatment Gait belt    Activity Tolerance Patient tolerated treatment well    Behavior During Therapy WFL for tasks assessed/performed             Past Medical History:  Diagnosis Date   Arthritis    hips and back   Asthma    as a baby-    GERD (gastroesophageal reflux disease)    rare -    Parkinson's disease ([place])    Pneumonia    age 52   Rash    legs- on going    Past Surgical History:  Procedure Laterality Date   APPENDECTOMY     BACK SURGERY     posterior lumbar spine fusion L4-5   COLONOSCOPY     COLONOSCOPY WITH PROPOFOL N/A 06/06/2017   Procedure: COLONOSCOPY WITH PROPOFOL;  Surgeon: Manya Silvas, MD;  Location: [facility] ENDOSCOPY;  Service: Endoscopy;  Laterality: N/A;   KNEE ARTHROSCOPY Bilateral    ACL   KNEE ARTHROSCOPY W/ ACL RECONSTRUCTION     RADIAL KERATOTOMY     ROTATOR CUFF REPAIR Bilateral    SHOULDER ARTHROSCOPY WITH BICEPS TENDON REPAIR Left 04/16/2019   Procedure: SHOULDER ARTHROSCOPY WITH BICEPS TENDON REPAIR, MINI OPEN SUPERIOR CAPSULAR RECONSTRUCTION, BICEPS TENODESIS;  Surgeon: Leim Fabry, MD;  Location: [facility] ORS;  Service: Orthopedics;  Laterality: Left;    There were no vitals filed for this visit.   Subjective Assessment - 03/11/21 1301     Subjective Patient reports a fall on  monday. Had speech therapy prior to PT session.    Patient is accompained by: Family member    Pertinent History Patient admitted to hospital 05/23/20 with code stroke and R sided weakness. Imaging negative for acute changes. PMH includes Parkinson's with autonomic instability, asthma, GERD depression, degenerative spondylolisthesis, focal seizures and migraine syndrome. Patient had three ED visits for falls since then. Patient reports when he has the seizures he has pain in his temples and bright flashes in his vision. Last week had three falls in one day.    Limitations Lifting;Standing;Walking;House hold activities    How long can you sit comfortably? sleeps in easy chair.    How long can you stand comfortably? feel unsteady once in standing.    How long can you walk comfortably? difficult to walk during "off period"    Patient Stated Goals stability, have a device that will be most helpful, help to become and stay independent.    Currently in Pain? No/denies                     Neuro Re-ed:    Airex pad: -modified word games with white board x 8 minutes; frequent cueing to fix L foot due to eversion and falling  off airex pad      TherEx:  Leg press; bilateral LE;6 plates 15x;  8 plates 10x ; 2 sets  Knee extension bilateral LE: 3 plates 15x;  4 plates 10x; 2 sets Knee flexion bilateral LE: 3 plates 15x;  4 plates 10x 2 sets  Squat with 10lb dumbbell to bench table  Seated paloff press 5lb dumbbell; 8x; 2 sets   Wall: Posture 10x 5 second holds with mirror cues for positioning Wall pushup 12x    Pt educated throughout session about proper posture and technique with exercises. Improved exercise technique, movement at target joints, use of target muscles after min to mod verbal, visual, tactile cues.     Patient tolerates progressive strengthening interventions well with no pain increase. Utilization of gym equipment was enjoyed by patient and will be utilized again  in future sessions. L ankle instability noted throughout session and education on potential need for use of brace in the future if progressively worsens on bad days. Pt will continue to benefit from skilled PT to improve balance, gait, and strength in order to improve functional mobility, safety with ADLs, reduce falls risk, and decrease caregiver burden.                PT Education - 03/11/21 1036     Education Details exercise technique, body mechanics    Person(s) Educated Patient    Methods Explanation;Demonstration;Tactile cues;Verbal cues    Comprehension Verbalized understanding;Returned demonstration;Verbal cues required;Tactile cues required              PT Short Term Goals - 10/21/20 0903       PT SHORT TERM GOAL #1   Title Patient will be independent in home exercise program to improve strength/mobility for better functional independence with ADLs.    Baseline 6/16: HEP to be given next session 7/19: HEP compliant    Time 4    Period Weeks    Status Achieved    Target Date 10/16/20               PT Long Term Goals - 02/25/21 1125       PT LONG TERM GOAL #1   Title Patient will increase FOTO score to equal to or greater than 51%    to demonstrate statistically significant improvement in mobility and quality of life.    Baseline 6/16: 48% 7/19: 57% 8/31: 50% 11/23: 53%    Time 12    Period Weeks    Status Achieved      PT LONG TERM GOAL #2   Title Patient will increase Berg Balance score by > 6 points (34/56)  to demonstrate decreased fall risk during functional activities.    Baseline 6/16: 28/56 7/19: 45/56    Time 12    Period Weeks    Status Achieved      PT LONG TERM GOAL #3   Title Patient will deny any falls over past 4 weeks to demonstrate improved safety awareness at home and work.    Baseline 6/16: multiple falls 7/19: no falls reported 9/7: 3 falls in past month; 2 falls in past month 11/23: 1 fall past month    Time 12    Period  Weeks    Status Partially Met    Target Date 05/20/21      PT LONG TERM GOAL #4   Title Patient will increase six minute walk test distance to >1300 for progression to age norm community ambulator and improve gait ability  Baseline 6/16: 945 ft with multiple near falls 7/19: 1495 ft    Time 12    Period Weeks    Status Achieved      PT LONG TERM GOAL #5   Title Patient will increase Berg Balance score by > 6 points (51/56)  to demonstrate decreased fall risk during functional activities.    Baseline 7/19: 45/56 8/31: 49/56; 10/14: 52/56 11/23: 52/56    Time 12    Period Weeks    Status Partially Met    Target Date 05/20/21      Additional Long Term Goals   Additional Long Term Goals Yes      PT LONG TERM GOAL #6   Title Patient will increase Functional Gait Assessment score to >20/30 as to reduce fall risk and improve dynamic gait safety with community ambulation.    Baseline 7/19: 16/30 8/31: 20/30;    Time 12    Period Weeks    Status Achieved      PT LONG TERM GOAL #7   Title Patient will increase Functional Gait Assessment score to >26/30 as to reduce fall risk and improve dynamic gait safety with community ambulation.    Baseline 8/31: 20/30; 10/14: 22/30 11/23: 24/30    Time 12    Period Weeks    Status Partially Met    Target Date 05/20/21      PT LONG TERM GOAL #8   Title Patient will static stand for approximately 5 minutes and perturbations for shooting/teaching classes without loss of balance or requiring object to lean on.    Baseline Unable to perform without leaning on objects; 10/14: Still unable to perform without leaning on objects. 11/23: able to perform 5 minutes    Time 12    Period Weeks    Status Achieved    Target Date 05/20/21      PT LONG TERM GOAL  #9   TITLE Patient will be able to ambulate approximately 1 mile walking dog across changing surfaces such as grass, concrete, and sidewalk without loss of balance.    Baseline 9/7: unable to  perform yet; 10/14: Unable 11/23: has not been able to attempt due to recent increase in "off times"    Time 12    Period Weeks    Status On-going    Target Date 05/20/21      PT LONG TERM GOAL  #10   TITLE Patient will bend down and pick up items without losing balance to perform iADLs such as emptying dishwasher and feeding dog.    Baseline 9/7: unable to perform; 10/14: Performing at home without falls or LOB.    Time 12    Period Weeks    Status Achieved      PT LONG TERM GOAL  #11   TITLE Patient will increase lower extremity functional scale to >60/80 to demonstrate improved functional mobility and increased tolerance with ADLs.    Baseline 11/23: 44    Time 12    Period Weeks    Status New    Target Date 05/20/21      PT LONG TERM GOAL  #12   TITLE Patient will static stand and reach into pocket to grab item without LOB for improved stability and mobility.    Baseline 11/23; LOB reaching into pocket    Time 12    Period Weeks    Status New    Target Date 05/20/21  Plan - 03/11/21 1303     Clinical Impression Statement Patient tolerates progressive strengthening interventions well with no pain increase. Utilization of gym equipment was enjoyed by patient and will be utilized again in future sessions. L ankle instability noted throughout session and education on potential need for use of brace in the future if progressively worsens on bad days. Pt will continue to benefit from skilled PT to improve balance, gait, and strength in order to improve functional mobility, safety with ADLs, reduce falls risk, and decrease caregiver burden.    Personal Factors and Comorbidities Age;Comorbidity 3+;Fitness;Past/Current Experience;Time since onset of injury/illness/exacerbation;Transportation    Comorbidities Parkinson's with autonomic instability, asthma, GERD depression, degenerative spondylolisthesis, focal seizures and migraine syndrome     Examination-Activity Limitations Bathing;Bed Mobility;Bend;Caring for [employer];Locomotion Level;Lift;Hygiene/Grooming;Squat;Stairs;Stand;Toileting;Transfers    Examination-Participation Restrictions Church;Cleaning;Community Activity;Driving;Meal Prep;Medication Management;Laundry;Shop;Volunteer;Yard Work    [employer]chant [employer] officer Evolving/Moderate complexity    Rehab Potential Fair    PT Frequency 2x / week    PT Duration 12 weeks    PT Treatment/Interventions ADLs/Self Care Home Management;Biofeedback;Canalith Repostioning;Cryotherapy;Parrafin;Ultrasound;Traction;Moist Heat;Iontophoresis 54m/ml Dexamethasone;Electrical Stimulation;DME Instruction;Gait training;Stair training;Functional mobility training;Neuromuscular re-education;Balance training;Therapeutic exercise;Therapeutic activities;Patient/family education;Orthotic Fit/Training;Manual techniques;Passive range of motion;Manual lymph drainage;Energy conservation;Splinting;Taping;Vasopneumatic Device;Vestibular;Joint Manipulations;Visual/perceptual remediation/compensation    PT Next Visit Plan Increase cognitive demand/dual tasking during gait, progress thoracic opening and rotaiton exercises, core strengthening, progress dynamic balance and gait training for increased BOS and dual tasking. Add core strengthening to HEP. Add dynadisc tasks with backwards rotational component.    PT Home Exercise Plan No updates today    Consulted and Agree with Plan of Care Patient    Family Member Consulted wife             Patient will benefit from skilled therapeutic intervention in order to improve the following deficits and impairments:  Abnormal gait, Decreased activity tolerance, Decreased balance, Decreased knowledge of precautions, Decreased endurance, Decreased coordination, Decreased cognition, Decreased knowledge of use of DME, Decreased mobility, Decreased safety awareness, Difficulty walking, Decreased  strength, Impaired flexibility, Impaired perceived functional ability, Impaired sensation, Impaired tone, Impaired UE functional use, Impaired vision/preception, Improper body mechanics, Postural dysfunction  Visit Diagnosis: Muscle weakness (generalized)  Unsteadiness on feet  Other abnormalities of gait and mobility     Problem List Patient Active Problem List   Diagnosis Date Noted   [place] 05/[place]   [place]'s disease [place]    History of migraine headaches    Migraine syndrome    BPH (benign prostatic hyperplasia)    Depression    Degenerative spondylolisthesis 05/07/2016    MJanna Arch PT, DPT  03/11/2021, 1:10 PM  [place] [address]Phone: [phone]  Fax:  [phone] Name: Cory GATLEYMRN: [id number] of Birth: 724-Jun-1957

## 2021-03-11 NOTE — Therapy (Signed)
[place] MAIN [facility] SERVICES [address] [place], [place], [address] Phone: [phone]   Fax:  [phone]  Speech Language Pathology Treatment  Patient Details  Name: Cory [name] MRN: [id number] Date of Birth: 1955/10/04 Referring Provider (SLP): Ramonita Lab III, MD   Encounter Date: 03/11/2021   End of Session - 03/11/21 1122     Visit Number 3    Number of Visits 9    Date for SLP Re-Evaluation 05/05/21    Authorization Type [employer]    SLP Start Time 1000    SLP Stop Time  1100    SLP Time Calculation (min) 60 min    Activity Tolerance Patient tolerated treatment well             Past Medical History:  Diagnosis Date   Arthritis    hips and back   Asthma    as a baby-    GERD (gastroesophageal reflux disease)    rare -    Parkinson's disease ([place])    Pneumonia    age 65   Rash    legs- on going    Past Surgical History:  Procedure Laterality Date   APPENDECTOMY     BACK SURGERY     posterior lumbar spine fusion L4-5   COLONOSCOPY     COLONOSCOPY WITH PROPOFOL N/A 06/06/2017   Procedure: COLONOSCOPY WITH PROPOFOL;  Surgeon: Manya Silvas, MD;  Location: [facility] ENDOSCOPY;  Service: Endoscopy;  Laterality: N/A;   KNEE ARTHROSCOPY Bilateral    ACL   KNEE ARTHROSCOPY W/ ACL RECONSTRUCTION     RADIAL KERATOTOMY     ROTATOR CUFF REPAIR Bilateral    SHOULDER ARTHROSCOPY WITH BICEPS TENDON REPAIR Left 04/16/2019   Procedure: SHOULDER ARTHROSCOPY WITH BICEPS TENDON REPAIR, MINI OPEN SUPERIOR CAPSULAR RECONSTRUCTION, BICEPS TENODESIS;  Surgeon: Leim Fabry, MD;  Location: [facility] ORS;  Service: Orthopedics;  Laterality: Left;    There were no vitals filed for this visit.   Subjective Assessment - 03/11/21 1117     Subjective Pt, wife worked on weekly schedule    Patient is accompained by: Family member   Cory [name]   Currently in Pain? No/denies                   ADULT SLP TREATMENT - 03/11/21 1118        General Information   Behavior/Cognition Alert;Cooperative   tangential at times   Patient Positioning Upright in chair      Cognitive-Linquistic Treatment   Treatment focused on Cognition;Patient/family/caregiver education    Skilled Treatment Reviewed schedule with pt/spouse. They generated a list of weekly activities and priorities. Pt also got his phone working again; pt left phone in the car. Encouraged pt to bring phone next time to work on integrating written schedule into a system pt can use for his phone (alerts, checklists, to do lists, etc). Pt's wife, SLP had to request repetition from pt on several occasions due to reduced vocal intensity, short rushes of speech. Pt has completed LOUD in the past but lost his exercises; provided with instructions, new functional phrases (pt and wife generated these with min-mod cues).      Assessment / Recommendations / Plan   Plan Continue with current plan of care      Progression Toward Goals   Progression toward goals Progressing toward goals              SLP Education - 03/11/21 1122  Education Details centralized system for information    Person(s) Educated Patient;Spouse    Methods Explanation    Comprehension Verbalized understanding;Need further instruction                SLP Long Term Goals - 02/04/21 1220       SLP LONG TERM GOAL #1   Title Pt will use daily schedule to prepare in advance for scheduled activities/outings with min caregiver cues.    Time 4    Period Weeks    Status New    Target Date 05/05/21      SLP LONG TERM GOAL #2   Title Pt/spouse will report no missed medications over 1 week period using medication management system/strategies.    Time 4    Period Weeks    Status New    Target Date 05/05/21      SLP LONG TERM GOAL #3   Title Pt/spouse will report using strategies to maximize communication effectiveness (reduce distractions, face-to-face conversation, chunking of information, repeat  back information, etc).    Time 4    Period Weeks    Status New    Target Date 05/05/21              Plan - 03/11/21 1123     Clinical Impression Statement Cory [name] presents with overall moderate cognitive communication deficits. Education today on focus of ST with concentration on strategies/compensations vs remediation. Pt and spouse appear open and willing to implement compensatory strategies to improve daily function. Discussed that pt may benefit from repeating LSVT LOUD course in the future; will continue with primary focus on cognitive goals/compensations. I recommend brief course of skilled ST to target use of strategies and compensations in specific functional tasks to maximize pt independence, improve safety, minimize frustration and reduce caregiver burden.    Speech Therapy Frequency 1x /week   adjust to 1x per week for 8 weeks due to scheduling   Duration 8 weeks    Treatment/Interventions Environmental controls;Cueing hierarchy;SLP instruction and feedback;Compensatory strategies;Functional tasks;Internal/external aids;Patient/family education    Potential to Achieve Goals Fair    Potential Considerations Ability to learn/carryover information;Severity of impairments;Previous level of function    SLP Home Exercise Plan to be provided next session    Consulted and Agree with Plan of Care Patient;Family member/caregiver    Family Member Consulted Wife             Patient will benefit from skilled therapeutic intervention in order to improve the following deficits and impairments:   Cognitive communication deficit    Problem List Patient Active Problem List   Diagnosis Date Noted   Focal seizure ([place]) 05/24/2020   Parkinson's disease ([place])    History of migraine headaches    Migraine syndrome    BPH (benign prostatic hyperplasia)    Depression    Degenerative spondylolisthesis 05/07/2016   Cory [name], [place], [place] Speech-Language  Pathologist  Cory [name], [place] 03/11/2021, 11:25 AM  [facility] [address] [place], [place], [address] Phone: [phone]   Fax:  [phone]   Name: Cory [name] MRN: [id number] Date of Birth: [birth year]

## 2021-03-12 NOTE — Therapy (Signed)
[place] MAIN [facility] SERVICES [address] [place], [place], [address] Phone: [phone]   Fax:  [phone]  Occupational Therapy Discharge  Patient Details  Name: Cory [name] MRN: [id number] Date of Birth: 1955-09-11 No data recorded  Encounter Date: 03/11/2021   OT End of Session - 03/12/21 1237     Visit Number 52    Number of Visits 57    Date for OT Re-Evaluation 03/15/21    Authorization Time Period Reporting period starting 02/09/2021    OT Start Time 1145    OT Stop Time 44    OT Time Calculation (min) 45 min    Equipment Utilized During Treatment 4 wheeled walker    Activity Tolerance Patient tolerated treatment well    Behavior During Therapy WFL for tasks assessed/performed             Past Medical History:  Diagnosis Date   Arthritis    hips and back   Asthma    as a baby-    GERD (gastroesophageal reflux disease)    rare -    Parkinson's disease ([place])    Pneumonia    age 65   Rash    legs- on going    Past Surgical History:  Procedure Laterality Date   APPENDECTOMY     BACK SURGERY     posterior lumbar spine fusion L4-5   COLONOSCOPY     COLONOSCOPY WITH PROPOFOL N/A 06/06/2017   Procedure: COLONOSCOPY WITH PROPOFOL;  Surgeon: Manya Silvas, MD;  Location: [facility] ENDOSCOPY;  Service: Endoscopy;  Laterality: N/A;   KNEE ARTHROSCOPY Bilateral    ACL   KNEE ARTHROSCOPY W/ ACL RECONSTRUCTION     RADIAL KERATOTOMY     ROTATOR CUFF REPAIR Bilateral    SHOULDER ARTHROSCOPY WITH BICEPS TENDON REPAIR Left 04/16/2019   Procedure: SHOULDER ARTHROSCOPY WITH BICEPS TENDON REPAIR, MINI OPEN SUPERIOR CAPSULAR RECONSTRUCTION, BICEPS TENODESIS;  Surgeon: Leim Fabry, MD;  Location: [facility] ORS;  Service: Orthopedics;  Laterality: Left;    There were no vitals filed for this visit.   Subjective Assessment - 03/11/21 1236     Subjective  "Doing fine today."    Patient is accompanied by: Family member     Pertinent History Parkinson's Disease, R sided weakness, falls    Limitations balance, strength, coordination, visual perception, problem solving, processing information    Patient Stated Goals "I want to get stronger.  It frustrates me when I have to ask for help with things like cutting my food or buttoning a button."    Currently in Pain? No/denies    Pain Score 0-No pain    Pain Onset More than a month ago                [facility] OT Assessment - 03/12/21 0001       Assessment   Medical Diagnosis Parkinson's Disease      Activity Tolerance   Activity Tolerance Comments large RPE fluctuation with ADLs throughout the week, ranging from 4-10      Observation/Other Assessments   Focus on Therapeutic Outcomes (FOTO)  63      Coordination   Right 9 Hole Peg Test 44    Left 9 Hole Peg Test 39      Hand Function   Right Hand Grip (lbs) 60    Right Hand Lateral Pinch 17 lbs    Right Hand 3 Point Pinch 17 lbs    Left Hand Grip (lbs)  60    Left Hand Lateral Pinch 21 lbs    Left 3 point pinch 17 lbs            Occupational Therapy Discharge Visit Pt has participated in 47 OT visits to address BUE weakness, lack of coordination, and ADL decline related to Parkinson's Disease.  Pt and spouse have been educated on modified ADL strategies, fall prevention/ADL safety, and pt has been given thorough BUE home program which includes flexibility, strengthening, and coordination exercises.  Pt/spouse are confident with handouts and ADL strategies and plan to implement HEP into daily routine.  See goals section for progress/status updates.   Therapeutic Exercise:  HEP review with additional handouts given per pt request, including cane stretches for bilat shoulders, dumbbell exercises for bilat elbow, forearm, wrist strengthening, [place] coordination exercises for hand dexterity and strengthening, including theraputty exercises, card activities, and coin manipulation activities.  Pt verbalizes  understanding the importance of incorporating various exercises into daily routine to maximize functional performance for self care tasks.    OT Education - 03/11/21 1237     Education Details HEP    Person(s) Educated Patient;Spouse    Methods Explanation;Demonstration;Verbal cues;Handout    Comprehension Verbalized understanding;Returned demonstration              OT Short Term Goals - 03/11/21 1239       OT SHORT TERM GOAL #1   Title Pt will perform BUE HEP with min vc    Baseline Eval: not yet initiated; 11/19/2020: Pt able to perform with min vc; 12/22/20: indep with cane exercises, need to instruct further in theraband exercises; 02/04/21: added to HEP today for elbow, forearm, wrist strengthening, mod vc with handout (ongoing); 03/11/21 d/c: HEP performed with min vc and handout    Time 6    Period Weeks    Status Achieved    Target Date 03/06/21      OT SHORT TERM GOAL #2   Title Pt will report RPE of 4 or better to indicate improved tolerance to basic ADLs.    Baseline Eval: RPE of 6 with basic ADLs; 11/19/2020: RPE of 5 with basic ADLs; 12/22/20: RPE for ADLs fluctuates greatly from 4 on a good day, to 10 on a bad day; 12/31/20: RPE continues to fluctate greatly throughout the week 4-10; 02/04/2021: significant fluctuations consistent with report on 12/31/20; 03/11/21 d/c: RPE with basic self care significantly fluctuates 4-10 throughout the week    Time 6    Period Weeks    Status Not Met    Target Date 03/06/21      OT SHORT TERM GOAL #3   Title Pt will utilize visual compensation strategies with min vc in order to improve basic and IADL performance.    Baseline Eval: pt reports some diplopia and decreased depth perception which limits ADL/IADL performance; Pt has been instructed in diplopia exercises and requires mod vc for compensation strategies when diplopia is present; 12/22/20: pt is indep with visual exercises and knowing when to allow rest breaks or use compensatory  strategies, vs request caregiver assist for difficult tasks.    Time 6    Period Weeks    Status Achieved    Target Date 12/22/20               OT Long Term Goals - 03/11/21 1241       OT LONG TERM GOAL #1   Title Pt will increase bilat shoulder strength by 1MM grade in order  to improve tolerance to UB ADLs/IADLs and reaching activities.    Baseline Eval: R/L shoulder flex/abd 4-/5, R/L shoulder ER 3+/5; stamina is improving but strength grades still same as eval; R/L shoulder flex/abd 4-, L ER 4-, R ER 3+, L/R IR 4 (pt reports now able to reach easier into overhead kitchen cabinets); 03/11/21 d/c R/L shoulder flex/abd 4-/5, ER 4-/5, IR 4/5; pt able to reach into overhead cabinets and reach for hangers in closets above shoulder level with greater ease    Time 12    Period Weeks    Status Achieved    Target Date 03/15/21      OT LONG TERM GOAL #2   Title Pt will manage clothing fasteners and looping belt with modified indep.    Baseline Eval: spouse assists.  Difficulty with tying shoes, looping belt, snapping and buttoning buttons; Using compensatory strategies for shoe tying as pt now has elastic shoe laces.  Pt now managing his own belt with slight extra time.  Increased time to fasten buttons; 12/22/2020: Pt is consistent to loop belt using compensatory strategies, but struggles with buttoning shirts consistently and typically spouse will assist; 02/04/21: remains consistent with looping belt, but extensive time needed for buttons; pt struggles with the motor planning and problem solving needed to manage buttons and spouse typically assists to conserve pt's energy; 03/11/21 d/c: Inconsistent; spouse assists when needed    Time 12    Period Weeks    Status Partially Met    Target Date 03/15/21      OT LONG TERM GOAL #3   Title Pt will cut food with modified indep.    Baseline Eval: spouse assists to cut food; Indep to cut food; 12/22/2020: Pt now struggles to cut meat; 12/31/20: Pt can  cut chicken with min vc to use large/exaggerated sawing motions but spouse cuts steak; 02/04/21:  Pt cuts meat with intermittent min vc for use of exaggerated sawing motions; 03/11/21: pt cuts food 25% of the time, otherwise spouse assists    Time 12    Period Weeks    Status Partially Met    Target Date 03/15/21      OT LONG TERM GOAL #4   Title Pt will brush hair with R dominant arm with modified indep.    Baseline Eval: Pt states he must hold R arm up with L hand, and often spouse must assist; 11/19/2020: Pt can reach to top and back of head with RUE but arm fatigues quickly with repetition required to brush hair.    Time 12    Period Weeks    Status Achieved    Target Date 12/22/20      OT LONG TERM GOAL #5   Title Pt will increase FOTO score to 62 or better to indicate measureable functional improvement with ADLs/IADLs.    Baseline Eval: FOTO score of 58; 12/22/2020: 57; 03/11/21 d/c: FOTO 63    Time 12    Period Weeks    Status Achieved    Target Date 03/15/21      OT LONG TERM GOAL #6   Title Pt will improve time management skills utilizing external compensatory strategies such as calendars, schedules, alarms as needed with min A.    Baseline 12/22/2020: Not yet initiated (spouse reports that pt is taking longer to complete ADLs yet he is not allowing extra time to complete, causing a rush to get out the door); 02/04/21 Pt has new phone and plans to set up alarms  to remind him for medication administration, but has not implemented; 03/11/21; pt starting to use alarms on new phone for exercises and medication reminders, but spouse still must provide reminders for consistency    Time 12    Period Weeks    Status Partially Met    Target Date 03/15/21      OT LONG TERM GOAL #7   Title Pt will improve dynamic standing balance to enable pt to feed dogs with modified indep.    Baseline 12/22/2020: Several falls over the last week; pt is inconsistent to use his rollator for dynamic standing  tasks.  Would need supv to feed dogs; 02/04/21: pt can feed dogs with modified indep using large scoopers and pitcher of water to pour into dishes on the floor rather than carry dishes full of water or food.    Time 12    Period Weeks    Status Achieved    Target Date 03/15/21                Plan - 03/11/21 1256     Clinical Impression Statement Pt has participated in 50 OT visits to address BUE weakness, lack of coordination, and ADL decline related to Parkinson's Disease.  Pt and spouse have been educated on modified ADL strategies, fall prevention/ADL safety, and pt has been given thorough BUE home program which includes flexibility, strengthening, and coordination exercises.  Pt/spouse are confident with handouts and ADL strategies and plan to implement HEP into daily routine.  See goals section for progress/status updates.  No additional skilled OT indicated at this time.    OT Occupational Profile and History Comprehensive Assessment- Review of records and extensive additional review of physical, cognitive, psychosocial history related to current functional performance    Occupational performance deficits (Please refer to evaluation for details): ADL's;IADL's;Leisure    Body Structure / Function / Physical Skills ADL;Coordination;Endurance;GMC;UE functional use;Balance;Sensation;IADL;Vision;Dexterity;FMC;Strength;Continence;Gait;Mobility    Rehab Potential Good    Clinical Decision Making Several treatment options, min-mod task modification necessary    Comorbidities Affecting Occupational Performance: May have comorbidities impacting occupational performance    Modification or Assistance to Complete Evaluation  Min-Moderate modification of tasks or assist with assess necessary to complete eval    OT Frequency 2x / week    OT Duration 12 weeks    OT Treatment/Interventions Self-care/ADL training;Therapeutic exercise;DME and/or AE instruction;Balance training;Neuromuscular  education;Manual Therapy;Visual/perceptual remediation/compensation;Therapeutic activities;Patient/family education    Consulted and Agree with Plan of Care Patient             Patient will benefit from skilled therapeutic intervention in order to improve the following deficits and impairments:   Body Structure / Function / Physical Skills: ADL, Coordination, Endurance, GMC, UE functional use, Balance, Sensation, IADL, Vision, Dexterity, FMC, Strength, Continence, Gait, Mobility       Visit Diagnosis: Muscle weakness (generalized)  Other lack of coordination  Parkinson's disease [facility])    Problem List Patient Active Problem List   Diagnosis Date Noted   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 [place], [place], [address] Phone: [phone]   Fax:  [phone]  Name: Cory [name] MRN: [id number] Date of  Birth: 1956/01/31

## 2021-03-16 ENCOUNTER — Other Ambulatory Visit: Payer: Self-pay

## 2021-03-16 ENCOUNTER — Ambulatory Visit: Payer: Medicare PPO

## 2021-03-16 DIAGNOSIS — R2681 Unsteadiness on feet: Secondary | ICD-10-CM | POA: Diagnosis not present

## 2021-03-16 DIAGNOSIS — R2689 Other abnormalities of gait and mobility: Secondary | ICD-10-CM

## 2021-03-16 DIAGNOSIS — M6281 Muscle weakness (generalized): Secondary | ICD-10-CM

## 2021-03-16 NOTE — Therapy (Signed)
[facility] [address] [place], [place], [address] Phone: [phone]   Fax:  [phone]  Physical Therapy Treatment  Patient Details  Name: Cory [name] MRN: [id number] Date of Birth: 29-Nov-1955 Referring Provider (PT): Benay Spice   Encounter Date: 03/16/2021   PT End of Session - 03/16/21 1217     Visit Number 31    Number of Visits 53    Date for PT Re-Evaluation 05/20/21    Authorization Type [employer] PPO    Authorization Time Period Cert 05/07/46-25/00/37    PT Start Time 1105    PT Stop Time 1147    PT Time Calculation (min) 42 min    Equipment Utilized During Treatment Gait belt    Activity Tolerance Patient tolerated treatment well    Behavior During Therapy WFL for tasks assessed/performed             Past Medical History:  Diagnosis Date   Arthritis    hips and back   Asthma    as a baby-    GERD (gastroesophageal reflux disease)    rare -    Parkinson's disease ([place])    Pneumonia    age 6   Rash    legs- on going    Past Surgical History:  Procedure Laterality Date   APPENDECTOMY     BACK SURGERY     posterior lumbar spine fusion L4-5   COLONOSCOPY     COLONOSCOPY WITH PROPOFOL N/A 06/06/2017   Procedure: COLONOSCOPY WITH PROPOFOL;  Surgeon: Manya Silvas, MD;  Location: [facility] ENDOSCOPY;  Service: Endoscopy;  Laterality: N/A;   KNEE ARTHROSCOPY Bilateral    ACL   KNEE ARTHROSCOPY W/ ACL RECONSTRUCTION     RADIAL KERATOTOMY     ROTATOR CUFF REPAIR Bilateral    SHOULDER ARTHROSCOPY WITH BICEPS TENDON REPAIR Left 04/16/2019   Procedure: SHOULDER ARTHROSCOPY WITH BICEPS TENDON REPAIR, MINI OPEN SUPERIOR CAPSULAR RECONSTRUCTION, BICEPS TENODESIS;  Surgeon: Leim Fabry, MD;  Location: [facility] ORS;  Service: Orthopedics;  Laterality: Left;    There were no vitals filed for this visit.   Subjective Assessment - 03/16/21 1110     Subjective Patient had a rough weekend.  Reports it was the worst weekend in a while. Had multiple "leans" aka LOB (15-20x).    Patient is accompained by: Family member    Pertinent History Patient admitted to hospital 05/23/20 with code stroke and R sided weakness. Imaging negative for acute changes. PMH includes Parkinson's with autonomic instability, asthma, GERD depression, degenerative spondylolisthesis, focal seizures and migraine syndrome. Patient had three ED visits for falls since then. Patient reports when he has the seizures he has pain in his temples and bright flashes in his vision. Last week had three falls in one day.    Limitations Lifting;Standing;Walking;House hold activities    How long can you sit comfortably? sleeps in easy chair.    How long can you stand comfortably? feel unsteady once in standing.    How long can you walk comfortably? difficult to walk during "off period"    Patient Stated Goals stability, have a device that will be most helpful, help to become and stay independent.                  Patient had a rough weekend. Reports it was the worst weekend in a while. Had multiple "leans" aka LOB (15-20x).     U step walker: Education on use  of laser for step length. Ambulate in hallway with use of U step walker. First 160 ft requires frequent cueing for widening BOS, stopping to reset feet, and assistance to stop weaving to the L. Second set of 160 ft improved after one episode of L weaving.   Standing on airex pad next to support bar with magnet/dry erase board: -lateral visual search and reach inside/outside BOS to grab letters to spell words based on PT guidance x 6 minutes  -modified word games with white board x 8 minutes; frequent cueing to fix L foot due to eversion and falling off airex pad   Seated: BTB eversion 15x LLE BTB DF 15x LLE Lateral step over hedgehog 12x each LE   Pt educated throughout session about proper posture and technique with exercises. Improved exercise  technique, movement at target joints, use of target muscles after min to mod verbal, visual, tactile cues.  Patient has increased deficits of LLE this session with increased eversion of L foot. He additionally has increased issues with stability and sequencing this session, wife and patient encouraged to reach out to neurologist about recent regression of symptoms. Pt will continue to benefit from skilled PT to improve balance, gait, and strength in order to improve functional mobility, safety with ADLs, reduce falls risk, and decrease caregiver burden.                PT Education - 03/16/21 1217     Education Details exercise technique, body mechanics    Person(s) Educated Patient    Methods Explanation;Demonstration;Tactile cues;Verbal cues    Comprehension Verbalized understanding;Returned demonstration;Verbal cues required;Tactile cues required              PT Short Term Goals - 10/21/20 0903       PT SHORT TERM GOAL #1   Title Patient will be independent in home exercise program to improve strength/mobility for better functional independence with ADLs.    Baseline 6/16: HEP to be given next session 7/19: HEP compliant    Time 4    Period Weeks    Status Achieved    Target Date 10/16/20               PT Long Term Goals - 02/25/21 1125       PT LONG TERM GOAL #1   Title Patient will increase FOTO score to equal to or greater than 51%    to demonstrate statistically significant improvement in mobility and quality of life.    Baseline 6/16: 48% 7/19: 57% 8/31: 50% 11/23: 53%    Time 12    Period Weeks    Status Achieved      PT LONG TERM GOAL #2   Title Patient will increase Berg Balance score by > 6 points (34/56)  to demonstrate decreased fall risk during functional activities.    Baseline 6/16: 28/56 7/19: 45/56    Time 12    Period Weeks    Status Achieved      PT LONG TERM GOAL #3   Title Patient will deny any falls over past 4 weeks to  demonstrate improved safety awareness at home and work.    Baseline 6/16: multiple falls 7/19: no falls reported 9/7: 3 falls in past month; 2 falls in past month 11/23: 1 fall past month    Time 12    Period Weeks    Status Partially Met    Target Date 05/20/21      PT LONG TERM GOAL #  4   Title Patient will increase six minute walk test distance to >1300 for progression to age norm community ambulator and improve gait ability    Baseline 6/16: 945 ft with multiple near falls 7/19: 1495 ft    Time 12    Period Weeks    Status Achieved      PT LONG TERM GOAL #5   Title Patient will increase Berg Balance score by > 6 points (51/56)  to demonstrate decreased fall risk during functional activities.    Baseline 7/19: 45/56 8/31: 49/56; 10/14: 52/56 11/23: 52/56    Time 12    Period Weeks    Status Partially Met    Target Date 05/20/21      Additional Long Term Goals   Additional Long Term Goals Yes      PT LONG TERM GOAL #6   Title Patient will increase Functional Gait Assessment score to >20/30 as to reduce fall risk and improve dynamic gait safety with community ambulation.    Baseline 7/19: 16/30 8/31: 20/30;    Time 12    Period Weeks    Status Achieved      PT LONG TERM GOAL #7   Title Patient will increase Functional Gait Assessment score to >26/30 as to reduce fall risk and improve dynamic gait safety with community ambulation.    Baseline 8/31: 20/30; 10/14: 22/30 11/23: 24/30    Time 12    Period Weeks    Status Partially Met    Target Date 05/20/21      PT LONG TERM GOAL #8   Title Patient will static stand for approximately 5 minutes and perturbations for shooting/teaching classes without loss of balance or requiring object to lean on.    Baseline Unable to perform without leaning on objects; 10/14: Still unable to perform without leaning on objects. 11/23: able to perform 5 minutes    Time 12    Period Weeks    Status Achieved    Target Date 05/20/21      PT  LONG TERM GOAL  #9   TITLE Patient will be able to ambulate approximately 1 mile walking dog across changing surfaces such as grass, concrete, and sidewalk without loss of balance.    Baseline 9/7: unable to perform yet; 10/14: Unable 11/23: has not been able to attempt due to recent increase in "off times"    Time 12    Period Weeks    Status On-going    Target Date 05/20/21      PT LONG TERM GOAL  #10   TITLE Patient will bend down and pick up items without losing balance to perform iADLs such as emptying dishwasher and feeding dog.    Baseline 9/7: unable to perform; 10/14: Performing at home without falls or LOB.    Time 12    Period Weeks    Status Achieved      PT LONG TERM GOAL  #11   TITLE Patient will increase lower extremity functional scale to >60/80 to demonstrate improved functional mobility and increased tolerance with ADLs.    Baseline 11/23: 44    Time 12    Period Weeks    Status New    Target Date 05/20/21      PT LONG TERM GOAL  #12   TITLE Patient will static stand and reach into pocket to grab item without LOB for improved stability and mobility.    Baseline 11/23; LOB reaching into pocket  Time 12    Period Weeks    Status New    Target Date 05/20/21                   Plan - 03/16/21 1220     Clinical Impression Statement Patient has increased deficits of LLE this session with increased eversion of L foot. He additionally has increased issues with stability and sequencing this session, wife and patient encouraged to reach out to neurologist about recent regression of symptoms. Pt will continue to benefit from skilled PT to improve balance, gait, and strength in order to improve functional mobility, safety with ADLs, reduce falls risk, and decrease caregiver burden.    Personal Factors and Comorbidities Age;Comorbidity 3+;Fitness;Past/Current Experience;Time since onset of injury/illness/exacerbation;Transportation    Comorbidities Parkinson's with  autonomic instability, asthma, GERD depression, degenerative spondylolisthesis, focal seizures and migraine syndrome    Examination-Activity Limitations Bathing;Bed Mobility;Bend;Caring for [employer];Locomotion Level;Lift;Hygiene/Grooming;Squat;Stairs;Stand;Toileting;Transfers    Examination-Participation Restrictions Church;Cleaning;Community Activity;Driving;Meal Prep;Medication Management;Laundry;Shop;Volunteer;Yard Work    [employer]chant [employer] officer Evolving/Moderate complexity    Rehab Potential Fair    PT Frequency 2x / week    PT Duration 12 weeks    PT Treatment/Interventions ADLs/Self Care Home Management;Biofeedback;Canalith Repostioning;Cryotherapy;Parrafin;Ultrasound;Traction;Moist Heat;Iontophoresis 80m/ml Dexamethasone;Electrical Stimulation;DME Instruction;Gait training;Stair training;Functional mobility training;Neuromuscular re-education;Balance training;Therapeutic exercise;Therapeutic activities;Patient/family education;Orthotic Fit/Training;Manual techniques;Passive range of motion;Manual lymph drainage;Energy conservation;Splinting;Taping;Vasopneumatic Device;Vestibular;Joint Manipulations;Visual/perceptual remediation/compensation    PT Next Visit Plan Increase cognitive demand/dual tasking during gait, progress thoracic opening and rotaiton exercises, core strengthening, progress dynamic balance and gait training for increased BOS and dual tasking. Add core strengthening to HEP. Add dynadisc tasks with backwards rotational component.    PT Home Exercise Plan No updates today    Consulted and Agree with Plan of Care Patient    Family Member Consulted wife             Patient will benefit from skilled therapeutic intervention in order to improve the following deficits and impairments:  Abnormal gait, Decreased activity tolerance, Decreased balance, Decreased knowledge of precautions, Decreased endurance, Decreased coordination, Decreased  cognition, Decreased knowledge of use of DME, Decreased mobility, Decreased safety awareness, Difficulty walking, Decreased strength, Impaired flexibility, Impaired perceived functional ability, Impaired sensation, Impaired tone, Impaired UE functional use, Impaired vision/preception, Improper body mechanics, Postural dysfunction  Visit Diagnosis: Muscle weakness (generalized)  Unsteadiness on feet  Other abnormalities of gait and mobility     Problem List Patient Active Problem List   Diagnosis Date Noted   [place] 05[place]   [place]'s [place]    History of migraine headaches    Migraine syndrome    BPH (benign prostatic hyperplasia)    Depression    Degenerative spondylolisthesis 05/07/2016    MJanna Arch PT, DPT  03/16/2021, 12:21 PM  [place] [address]Phone: [phone]  Fax:  [phone] Name: Cory SEBRINGMRN: [id number] of Birth: [birth year]

## 2021-03-18 ENCOUNTER — Ambulatory Visit: Payer: Medicare PPO | Admitting: Speech Pathology

## 2021-03-18 ENCOUNTER — Other Ambulatory Visit: Payer: Self-pay

## 2021-03-18 ENCOUNTER — Ambulatory Visit: Payer: Medicare PPO

## 2021-03-18 DIAGNOSIS — M6281 Muscle weakness (generalized): Secondary | ICD-10-CM

## 2021-03-18 DIAGNOSIS — R2689 Other abnormalities of gait and mobility: Secondary | ICD-10-CM

## 2021-03-18 DIAGNOSIS — R41841 Cognitive communication deficit: Secondary | ICD-10-CM

## 2021-03-18 DIAGNOSIS — R2681 Unsteadiness on feet: Secondary | ICD-10-CM | POA: Diagnosis not present

## 2021-03-18 DIAGNOSIS — G20 Parkinson's disease: Secondary | ICD-10-CM

## 2021-03-18 NOTE — Therapy (Signed)
[place] MAIN [facility] SERVICES [address] [place], [place], [address] Phone: [phone]   Fax:  [phone]  Physical Therapy Treatment  Patient Details  Name: Cory [name] MRN: [id number] Date of Birth: 04-02-1956 Referring Provider (PT): Benay Spice   Encounter Date: 03/18/2021   PT End of Session - 03/18/21 1013     Visit Number 81    Number of Visits 60    Date for PT Re-Evaluation 05/20/21    Authorization Type [employer] PPO    Authorization Time Period Cert 05/08/28-07/62/26    PT Start Time 1101    PT Stop Time 1145    PT Time Calculation (min) 44 min    Equipment Utilized During Treatment Gait belt    Activity Tolerance Patient tolerated treatment well    Behavior During Therapy WFL for tasks assessed/performed             Past Medical History:  Diagnosis Date   Arthritis    hips and back   Asthma    as a baby-    GERD (gastroesophageal reflux disease)    rare -    Parkinson's disease ([place])    Pneumonia    age 61   Rash    legs- on going    Past Surgical History:  Procedure Laterality Date   APPENDECTOMY     BACK SURGERY     posterior lumbar spine fusion L4-5   COLONOSCOPY     COLONOSCOPY WITH PROPOFOL N/A 06/06/2017   Procedure: COLONOSCOPY WITH PROPOFOL;  Surgeon: Manya Silvas, MD;  Location: [facility] ENDOSCOPY;  Service: Endoscopy;  Laterality: N/A;   KNEE ARTHROSCOPY Bilateral    ACL   KNEE ARTHROSCOPY W/ ACL RECONSTRUCTION     RADIAL KERATOTOMY     ROTATOR CUFF REPAIR Bilateral    SHOULDER ARTHROSCOPY WITH BICEPS TENDON REPAIR Left 04/16/2019   Procedure: SHOULDER ARTHROSCOPY WITH BICEPS TENDON REPAIR, MINI OPEN SUPERIOR CAPSULAR RECONSTRUCTION, BICEPS TENODESIS;  Surgeon: Leim Fabry, MD;  Location: [facility] ORS;  Service: Orthopedics;  Laterality: Left;    There were no vitals filed for this visit.   Subjective Assessment - 03/18/21 1324     Subjective Patient reports he is having a  good day today. Had a bad day yesterday but woke up this morning feeling better. No falls today.    Patient is accompained by: Family member    Pertinent History Patient admitted to hospital 05/23/20 with code stroke and R sided weakness. Imaging negative for acute changes. PMH includes Parkinson's with autonomic instability, asthma, GERD depression, degenerative spondylolisthesis, focal seizures and migraine syndrome. Patient had three ED visits for falls since then. Patient reports when he has the seizures he has pain in his temples and bright flashes in his vision. Last week had three falls in one day.    Limitations Lifting;Standing;Walking;House hold activities    How long can you sit comfortably? sleeps in easy chair.    How long can you stand comfortably? feel unsteady once in standing.    How long can you walk comfortably? difficult to walk during "off period"    Patient Stated Goals stability, have a device that will be most helpful, help to become and stay independent.    Currently in Pain? No/denies               In [facility] Leg press; bilateral LE; 8 plates 10x ; 10 plates 10x with 2 sets ; cuein gfor neutral alignment of  LLE for pain reduction  Knee extension bilateral LE:  4 plates 10x; 2 sets Knee flexion bilateral LE:  4 plates 10x 2 sets   Standing with support surface:   Standing on airex pad next to support bar with magnet/dry erase board: -lateral visual search and reach inside/outside BOS to grab letters to spell words based on PT guidance with modified word games with white board x 8 minutes;    Seated: BTB four way resisted ankle 10x each direction BTB adduction 15x LLE    Pt educated throughout session about proper posture and technique with exercises. Improved exercise technique, movement at target joints, use of target muscles after min to mod verbal, visual, tactile cues.    Patient tolerated progressive strengthening with increased resistance. Patient  had occasional LLE rotation requiring cueing for body mechanics and sequencing. Patient is waiting to hear back from neurologist about recent increase in "bad" days. Pt will continue to benefit from skilled PT to improve balance, gait, and strength in order to improve functional mobility, safety with ADLs, reduce falls risk, and decrease caregiver burden.                  PT Education - 03/18/21 1012     Education Details exercise technique, body mechanics    Person(s) Educated Patient    Methods Explanation;Demonstration;Tactile cues;Verbal cues    Comprehension Verbalized understanding;Returned demonstration;Verbal cues required;Tactile cues required              PT Short Term Goals - 10/21/20 0903       PT SHORT TERM GOAL #1   Title Patient will be independent in home exercise program to improve strength/mobility for better functional independence with ADLs.    Baseline 6/16: HEP to be given next session 7/19: HEP compliant    Time 4    Period Weeks    Status Achieved    Target Date 10/16/20               PT Long Term Goals - 02/25/21 1125       PT LONG TERM GOAL #1   Title Patient will increase FOTO score to equal to or greater than 51%    to demonstrate statistically significant improvement in mobility and quality of life.    Baseline 6/16: 48% 7/19: 57% 8/31: 50% 11/23: 53%    Time 12    Period Weeks    Status Achieved      PT LONG TERM GOAL #2   Title Patient will increase Berg Balance score by > 6 points (34/56)  to demonstrate decreased fall risk during functional activities.    Baseline 6/16: 28/56 7/19: 45/56    Time 12    Period Weeks    Status Achieved      PT LONG TERM GOAL #3   Title Patient will deny any falls over past 4 weeks to demonstrate improved safety awareness at home and work.    Baseline 6/16: multiple falls 7/19: no falls reported 9/7: 3 falls in past month; 2 falls in past month 11/23: 1 fall past month    Time 12     Period Weeks    Status Partially Met    Target Date 05/20/21      PT LONG TERM GOAL #4   Title Patient will increase six minute walk test distance to >1300 for progression to age norm community ambulator and improve gait ability    Baseline 6/16: 945 ft with multiple near falls  7/19: 1495 ft    Time 12    Period Weeks    Status Achieved      PT LONG TERM GOAL #5   Title Patient will increase Berg Balance score by > 6 points (51/56)  to demonstrate decreased fall risk during functional activities.    Baseline 7/19: 45/56 8/31: 49/56; 10/14: 52/56 11/23: 52/56    Time 12    Period Weeks    Status Partially Met    Target Date 05/20/21      Additional Long Term Goals   Additional Long Term Goals Yes      PT LONG TERM GOAL #6   Title Patient will increase Functional Gait Assessment score to >20/30 as to reduce fall risk and improve dynamic gait safety with community ambulation.    Baseline 7/19: 16/30 8/31: 20/30;    Time 12    Period Weeks    Status Achieved      PT LONG TERM GOAL #7   Title Patient will increase Functional Gait Assessment score to >26/30 as to reduce fall risk and improve dynamic gait safety with community ambulation.    Baseline 8/31: 20/30; 10/14: 22/30 11/23: 24/30    Time 12    Period Weeks    Status Partially Met    Target Date 05/20/21      PT LONG TERM GOAL #8   Title Patient will static stand for approximately 5 minutes and perturbations for shooting/teaching classes without loss of balance or requiring object to lean on.    Baseline Unable to perform without leaning on objects; 10/14: Still unable to perform without leaning on objects. 11/23: able to perform 5 minutes    Time 12    Period Weeks    Status Achieved    Target Date 05/20/21      PT LONG TERM GOAL  #9   TITLE Patient will be able to ambulate approximately 1 mile walking dog across changing surfaces such as grass, concrete, and sidewalk without loss of balance.    Baseline 9/7: unable  to perform yet; 10/14: Unable 11/23: has not been able to attempt due to recent increase in "off times"    Time 12    Period Weeks    Status On-going    Target Date 05/20/21      PT LONG TERM GOAL  #10   TITLE Patient will bend down and pick up items without losing balance to perform iADLs such as emptying dishwasher and feeding dog.    Baseline 9/7: unable to perform; 10/14: Performing at home without falls or LOB.    Time 12    Period Weeks    Status Achieved      PT LONG TERM GOAL  #11   TITLE Patient will increase lower extremity functional scale to >60/80 to demonstrate improved functional mobility and increased tolerance with ADLs.    Baseline 11/23: 44    Time 12    Period Weeks    Status New    Target Date 05/20/21      PT LONG TERM GOAL  #12   TITLE Patient will static stand and reach into pocket to grab item without LOB for improved stability and mobility.    Baseline 11/23; LOB reaching into pocket    Time 12    Period Weeks    Status New    Target Date 05/20/21  Plan - 03/18/21 1334     Clinical Impression Statement Patient tolerated progressive strengthening with increased resistance. Patient had occasional LLE rotation requiring cueing for body mechanics and sequencing. Patient is waiting to hear back from neurologist about recent increase in "bad" days. Pt will continue to benefit from skilled PT to improve balance, gait, and strength in order to improve functional mobility, safety with ADLs, reduce falls risk, and decrease caregiver burden.    Personal Factors and Comorbidities Age;Comorbidity 3+;Fitness;Past/Current Experience;Time since onset of injury/illness/exacerbation;Transportation    Comorbidities Parkinson's with autonomic instability, asthma, GERD depression, degenerative spondylolisthesis, focal seizures and migraine syndrome    Examination-Activity Limitations Bathing;Bed Mobility;Bend;Caring for [employer];Locomotion Level;Lift;Hygiene/Grooming;Squat;Stairs;Stand;Toileting;Transfers    Examination-Participation Restrictions Church;Cleaning;Community Activity;Driving;Meal Prep;Medication Management;Laundry;Shop;Volunteer;Yard Work    [employer]chant [employer] officer Evolving/Moderate complexity    Rehab Potential Fair    PT Frequency 2x / week    PT Duration 12 weeks    PT Treatment/Interventions ADLs/Self Care Home Management;Biofeedback;Canalith Repostioning;Cryotherapy;Parrafin;Ultrasound;Traction;Moist Heat;Iontophoresis 31m/ml Dexamethasone;Electrical Stimulation;DME Instruction;Gait training;Stair training;Functional mobility training;Neuromuscular re-education;Balance training;Therapeutic exercise;Therapeutic activities;Patient/family education;Orthotic Fit/Training;Manual techniques;Passive range of motion;Manual lymph drainage;Energy conservation;Splinting;Taping;Vasopneumatic Device;Vestibular;Joint Manipulations;Visual/perceptual remediation/compensation    PT Next Visit Plan Increase cognitive demand/dual tasking during gait, progress thoracic opening and rotaiton exercises, core strengthening, progress dynamic balance and gait training for increased BOS and dual tasking. Add core strengthening to HEP. Add dynadisc tasks with backwards rotational component.    PT Home Exercise Plan No updates today    Consulted and Agree with Plan of Care Patient    Family Member Consulted wife             Patient will benefit from skilled therapeutic intervention in order to improve the following deficits and impairments:  Abnormal gait, Decreased activity tolerance, Decreased balance, Decreased knowledge of precautions, Decreased endurance, Decreased coordination, Decreased cognition, Decreased knowledge of use of DME, Decreased mobility, Decreased safety awareness, Difficulty walking, Decreased strength, Impaired flexibility, Impaired perceived functional ability, Impaired sensation, Impaired  tone, Impaired UE functional use, Impaired vision/preception, Improper body mechanics, Postural dysfunction  Visit Diagnosis: Muscle weakness (generalized)  Unsteadiness on feet  Other abnormalities of gait and mobility     Problem List Patient Active Problem List   Diagnosis Date Noted   [place]ocal [place] ([place] 05/24/2020   [place]inson's disease ([place]    History of migraine headaches    Migraine syndrome    BPH (benign prostatic hyperplasia)    Depression    Degenerative spondylolisthesis 05/07/2016    MJanna Arch PT, DPT  03/18/2021, 1:40 PM  [address] [address]Phone: [phone]  Fax:  [phone] Name: TMASAI KIDDMRN: [id number] of Birth: 720-May-1957

## 2021-03-18 NOTE — Patient Instructions (Signed)
Finish setting up your alarms for your medications  2.Research scheduling, to do list, or organization Apps for your phone. Search "best free to do list apps" and read the reviews.  Some suggestions:  Blunt  [employer] to do  [employer]

## 2021-03-18 NOTE — Therapy (Signed)
[place] MAIN [facility] SERVICES [address] [place], [place], [address] Phone: [phone]   Fax:  [phone]  Speech Language Pathology Treatment  Patient Details  Name: Cory [name] MRN: [id number] Date of Birth: 06/01/1955 Referring Provider (SLP): Ramonita Lab III, MD   Encounter Date: 03/18/2021   End of Session - 03/18/21 1126     Visit Number 4    Number of Visits 9    Date for SLP Re-Evaluation 05/05/21    Authorization Type [employer]    SLP Start Time 1000    SLP Stop Time  1100    SLP Time Calculation (min) 60 min    Activity Tolerance Patient tolerated treatment well             Past Medical History:  Diagnosis Date   Arthritis    hips and back   Asthma    as a baby-    GERD (gastroesophageal reflux disease)    rare -    Parkinson's disease ([place])    Pneumonia    age 65   Rash    legs- on going    Past Surgical History:  Procedure Laterality Date   APPENDECTOMY     BACK SURGERY     posterior lumbar spine fusion L4-5   COLONOSCOPY     COLONOSCOPY WITH PROPOFOL N/A 06/06/2017   Procedure: COLONOSCOPY WITH PROPOFOL;  Surgeon: Manya Silvas, MD;  Location: [facility] ENDOSCOPY;  Service: Endoscopy;  Laterality: N/A;   KNEE ARTHROSCOPY Bilateral    ACL   KNEE ARTHROSCOPY W/ ACL RECONSTRUCTION     RADIAL KERATOTOMY     ROTATOR CUFF REPAIR Bilateral    SHOULDER ARTHROSCOPY WITH BICEPS TENDON REPAIR Left 04/16/2019   Procedure: SHOULDER ARTHROSCOPY WITH BICEPS TENDON REPAIR, MINI OPEN SUPERIOR CAPSULAR RECONSTRUCTION, BICEPS TENODESIS;  Surgeon: Leim Fabry, MD;  Location: [facility] ORS;  Service: Orthopedics;  Laterality: Left;    There were no vitals filed for this visit.   Subjective Assessment - 03/18/21 1121     Subjective Reports he was off-balance a lot last week    Patient is accompained by: Family member   Cory [name]   Currently in Pain? No/denies                   ADULT SLP TREATMENT - 03/18/21 1122        General Information   Behavior/Cognition Alert;Cooperative    Patient Positioning Upright in chair      Treatment Provided   Treatment provided Cognitive-Linquistic      Pain Assessment   Pain Assessment No/denies pain      Cognitive-Linquistic Treatment   Treatment focused on Cognition;Patient/family/caregiver education    Skilled Treatment Worked with pt to ID/trouble shoot issues with schedule last week. Pt reported some days it felt there was "too much" and other days "not enough." Reinforced that schedule is just an outline, but pt can and should adapt the schedule to his needs/what he is able to accomplish that day and reschedule uncompleted tasks. Pt attempting to create an excel checklist on his phone for schedule, however had difficulty accessing. Suggested using an app instead of creating his own design, which may be more user-friendly. pt was agreeable for this. Gave pt assignment of reviewing some suggested apps and researching on his own. Instructions on using voice search to assist with setting alarms for medications; pt able to return demonstration.      Assessment / Recommendations / Plan  Plan Continue with current plan of care      Progression Toward Goals   Progression toward goals Progressing toward goals              SLP Education - 03/18/21 1126     Education Details make changes/adjust schedule daily based on how his day is going    Person(s) Educated Patient;Spouse    Methods Explanation    Comprehension Verbalized understanding                SLP Long Term Goals - 02/04/21 1220       SLP LONG TERM GOAL #1   Title Pt will use daily schedule to prepare in advance for scheduled activities/outings with min caregiver cues.    Time 4    Period Weeks    Status New    Target Date 05/05/21      SLP LONG TERM GOAL #2   Title Pt/spouse will report no missed medications over 1 week period using medication management system/strategies.    Time  4    Period Weeks    Status New    Target Date 05/05/21      SLP LONG TERM GOAL #3   Title Pt/spouse will report using strategies to maximize communication effectiveness (reduce distractions, face-to-face conversation, chunking of information, repeat back information, etc).    Time 4    Period Weeks    Status New    Target Date 05/05/21              Plan - 03/18/21 1127     Clinical Impression Statement Cory [name] presents with overall moderate cognitive communication deficits. Pt using paper schedule but would like to use his phone; receptive to strategies/suggestions discusses today. Pt and spouse appear open and willing to implement compensatory strategies to improve daily function. Discussed that pt may benefit from repeating LSVT LOUD course in the future; will continue with primary focus on cognitive goals/compensations. I recommend brief course of skilled ST to target use of strategies and compensations in specific functional tasks to maximize pt independence, improve safety, minimize frustration and reduce caregiver burden.    Speech Therapy Frequency 1x /week   adjust to 1x per week for 8 weeks due to scheduling   Duration 8 weeks    Treatment/Interventions Environmental controls;Cueing hierarchy;SLP instruction and feedback;Compensatory strategies;Functional tasks;Internal/external aids;Patient/family education    Potential to Achieve Goals Fair    Potential Considerations Ability to learn/carryover information;Severity of impairments;Previous level of function    SLP Home Exercise Plan to be provided next session    Consulted and Agree with Plan of Care Patient;Family member/caregiver    Family Member Consulted Wife             Patient will benefit from skilled therapeutic intervention in order to improve the following deficits and impairments:   Cognitive communication deficit  Parkinson's disease [facility])    Problem List Patient Active Problem List    Diagnosis Date Noted   Focal seizure ([place]) 05/24/2020   Parkinson's disease ([place])    History of migraine headaches    Migraine syndrome    BPH (benign prostatic hyperplasia)    Depression    Degenerative spondylolisthesis 05/07/2016   Deneise Lever, [place], [place] Speech-Language Pathologist  Aliene Altes, [place] 03/18/2021, 11:41 AM  [place] [address] [place], [place], [address] Phone: [phone]   Fax:  [phone]   Name: Cory [name] MRN: [id number]  Date of Birth: April 15, 1955

## 2021-03-23 ENCOUNTER — Other Ambulatory Visit: Payer: Self-pay

## 2021-03-23 ENCOUNTER — Ambulatory Visit: Payer: Medicare PPO

## 2021-03-23 DIAGNOSIS — R2681 Unsteadiness on feet: Secondary | ICD-10-CM

## 2021-03-23 DIAGNOSIS — M6281 Muscle weakness (generalized): Secondary | ICD-10-CM

## 2021-03-23 DIAGNOSIS — R2689 Other abnormalities of gait and mobility: Secondary | ICD-10-CM

## 2021-03-23 NOTE — Therapy (Signed)
[place] MAIN [facility] SERVICES [address] [place], [place], [address] Phone: [phone]   Fax:  [phone]  Physical Therapy Treatment  Patient Details  Name: Cory [name] MRN: [id number] Date of Birth: 06-Jun-1955 Referring Provider (PT): Benay Spice   Encounter Date: 03/23/2021   PT End of Session - 03/23/21 1111     Visit Number 55    Number of Visits 80    Date for PT Re-Evaluation 05/20/21    Authorization Type [employer] PPO    Authorization Time Period Cert 0/1/00-71/21/97    PT Start Time 1103    PT Stop Time 1145    PT Time Calculation (min) 42 min    Equipment Utilized During Treatment Gait belt    Activity Tolerance Patient tolerated treatment well    Behavior During Therapy WFL for tasks assessed/performed             Past Medical History:  Diagnosis Date   Arthritis    hips and back   Asthma    as a baby-    GERD (gastroesophageal reflux disease)    rare -    Parkinson's disease ([place])    Pneumonia    age 85   Rash    legs- on going    Past Surgical History:  Procedure Laterality Date   APPENDECTOMY     BACK SURGERY     posterior lumbar spine fusion L4-5   COLONOSCOPY     COLONOSCOPY WITH PROPOFOL N/A 06/06/2017   Procedure: COLONOSCOPY WITH PROPOFOL;  Surgeon: Manya Silvas, MD;  Location: [facility] ENDOSCOPY;  Service: Endoscopy;  Laterality: N/A;   KNEE ARTHROSCOPY Bilateral    ACL   KNEE ARTHROSCOPY W/ ACL RECONSTRUCTION     RADIAL KERATOTOMY     ROTATOR CUFF REPAIR Bilateral    SHOULDER ARTHROSCOPY WITH BICEPS TENDON REPAIR Left 04/16/2019   Procedure: SHOULDER ARTHROSCOPY WITH BICEPS TENDON REPAIR, MINI OPEN SUPERIOR CAPSULAR RECONSTRUCTION, BICEPS TENODESIS;  Surgeon: Leim Fabry, MD;  Location: [facility] ORS;  Service: Orthopedics;  Laterality: Left;    There were no vitals filed for this visit.   Subjective Assessment - 03/23/21 1110     Subjective Patient fell into brick hearth  since last session. Has had three almost falls this morning in the shower.    Patient is accompained by: Family member    Pertinent History Patient admitted to hospital 05/23/20 with code stroke and R sided weakness. Imaging negative for acute changes. PMH includes Parkinson's with autonomic instability, asthma, GERD depression, degenerative spondylolisthesis, focal seizures and migraine syndrome. Patient had three ED visits for falls since then. Patient reports when he has the seizures he has pain in his temples and bright flashes in his vision. Last week had three falls in one day.    Limitations Lifting;Standing;Walking;House hold activities    How long can you sit comfortably? sleeps in easy chair.    How long can you stand comfortably? feel unsteady once in standing.    How long can you walk comfortably? difficult to walk during "off period"    Patient Stated Goals stability, have a device that will be most helpful, help to become and stay independent.    Currently in Pain? No/denies               TherEx Nustep Lvl 4 RPM> 60 for cardiovascular and musculoskeletal challenge x 4 minutes Ambulate >500 ft with Ustep walker working on stepping on line, keeping walker closer  to body, and upright posture. Occasional cues to maintain Com required.  ° °Neuro Re-ed ° °airex pad: eyes closed 30 seconds °Airex pad: eyes closed alternating UE raises 10x each UE  °Airex pad: RTB row 25x with cue for scapular retraction °Standing on airex pad next to support bar with magnet/dry erase board: °-lateral visual search and reach inside/outside BOS to grab letters to spell words based on PT guidance with modified word games with white board x 6 minutes;  ° ° °Pt educated throughout session about proper posture and technique with exercises. Improved exercise technique, movement at target joints, use of target muscles after min to mod verbal, visual, tactile cues. ° ° °Patient re-educated on safe ambulation  techniques with U-step walker and encouraged to continue practicing ambulatory techniques between sessions.Eyes closed on unstable surfaces are improving with L foot continuing to be an issue with alignment. Patient brought in knee brace and was assisted with donning/doffing brace, educated on need for L ankle brace rather than knee brace. Pt will continue to benefit from skilled PT to improve balance, gait, and strength in order to improve functional mobility, safety with ADLs, reduce falls risk, and decrease caregiver burden. ° ° ° ° ° ° ° ° ° ° ° ° ° ° ° ° ° ° PT Education - 03/23/21 1111   ° ° Education Details exercise technique, body mechanics   ° Person(s) Educated Patient   ° Methods Explanation;Demonstration;Tactile cues;Verbal cues   ° Comprehension Verbalized understanding;Returned demonstration;Verbal cues required;Tactile cues required   ° °  °  ° °  ° ° ° PT Short Term Goals - 10/21/20 0903   ° °  ° PT SHORT TERM GOAL #1  ° Title Patient will be independent in home exercise program to improve strength/mobility for better functional independence with ADLs.   ° Baseline 6/16: HEP to be given next session 7/19: HEP compliant   ° Time 4   ° Period Weeks   ° Status Achieved   ° Target Date 10/16/20   ° °  °  ° °  ° ° ° ° PT Long Term Goals - 02/25/21 1125   ° °  ° PT LONG TERM GOAL #1  ° Title Patient will increase FOTO score to equal to or greater than 51%    to demonstrate statistically significant improvement in mobility and quality of life.   ° Baseline 6/16: 48% 7/19: 57% 8/31: 50% 11/23: 53%   ° Time 12   ° Period Weeks   ° Status Achieved   °  ° PT LONG TERM GOAL #2  ° Title Patient will increase Berg Balance score by > 6 points (34/56)  to demonstrate decreased fall risk during functional activities.   ° Baseline 6/16: 28/56 7/19: 45/56   ° Time 12   ° Period Weeks   ° Status Achieved   °  ° PT LONG TERM GOAL #3  ° Title Patient will deny any falls over past 4 weeks to demonstrate improved safety  awareness at home and work.   ° Baseline 6/16: multiple falls 7/19: no falls reported 9/7: 3 falls in past month; 2 falls in past month 11/23: 1 fall past month   ° Time 12   ° Period Weeks   ° Status Partially Met   ° Target Date 05/20/21   °  ° PT LONG TERM GOAL #4  ° Title Patient will increase six minute walk test distance to >1300 for progression to age norm   community ambulator and improve gait ability   ° Baseline 6/16: 945 ft with multiple near falls 7/19: 1495 ft   ° Time 12   ° Period Weeks   ° Status Achieved   °  ° PT LONG TERM GOAL #5  ° Title Patient will increase Berg Balance score by > 6 points (51/56)  to demonstrate decreased fall risk during functional activities.   ° Baseline 7/19: 45/56 8/31: 49/56; 10/14: 52/56 11/23: 52/56   ° Time 12   ° Period Weeks   ° Status Partially Met   ° Target Date 05/20/21   °  ° Additional Long Term Goals  ° Additional Long Term Goals Yes   °  ° PT LONG TERM GOAL #6  ° Title Patient will increase Functional Gait Assessment score to >20/30 as to reduce fall risk and improve dynamic gait safety with community ambulation.   ° Baseline 7/19: 16/30 8/31: 20/30;   ° Time 12   ° Period Weeks   ° Status Achieved   °  ° PT LONG TERM GOAL #7  ° Title Patient will increase Functional Gait Assessment score to >26/30 as to reduce fall risk and improve dynamic gait safety with community ambulation.   ° Baseline 8/31: 20/30; 10/14: 22/30 11/23: 24/30   ° Time 12   ° Period Weeks   ° Status Partially Met   ° Target Date 05/20/21   °  ° PT LONG TERM GOAL #8  ° Title Patient will static stand for approximately 5 minutes and perturbations for shooting/teaching classes without loss of balance or requiring object to lean on.   ° Baseline Unable to perform without leaning on objects; 10/14: Still unable to perform without leaning on objects. 11/23: able to perform 5 minutes   ° Time 12   ° Period Weeks   ° Status Achieved   ° Target Date 05/20/21   °  ° PT LONG TERM GOAL  #9  ° TITLE  Patient will be able to ambulate approximately 1 mile walking dog across changing surfaces such as grass, concrete, and sidewalk without loss of balance.   ° Baseline 9/7: unable to perform yet; 10/14: Unable 11/23: has not been able to attempt due to recent increase in "off times"   ° Time 12   ° Period Weeks   ° Status On-going   ° Target Date 05/20/21   °  ° PT LONG TERM GOAL  #10  ° TITLE Patient will bend down and pick up items without losing balance to perform iADLs such as emptying dishwasher and feeding dog.   ° Baseline 9/7: unable to perform; 10/14: Performing at home without falls or LOB.   ° Time 12   ° Period Weeks   ° Status Achieved   °  ° PT LONG TERM GOAL  #11  ° TITLE Patient will increase lower extremity functional scale to >60/80 to demonstrate improved functional mobility and increased tolerance with ADLs.   ° Baseline 11/23: 44   ° Time 12   ° Period Weeks   ° Status New   ° Target Date 05/20/21   °  ° PT LONG TERM GOAL  #12  ° TITLE Patient will static stand and reach into pocket to grab item without LOB for improved stability and mobility.   ° Baseline 11/23; LOB reaching into pocket   ° Time 12   ° Period Weeks   ° Status New   ° Target Date 05/20/21   ° °  °  ° °  ° ° ° ° ° ° ° °   Plan - 03/23/21 1232     Clinical Impression Statement Patient re-educated on safe ambulation techniques with U-step walker and encouraged to continue practicing ambulatory techniques between sessions.Eyes closed on unstable surfaces are improving with L foot continuing to be an issue with alignment. Patient brought in knee brace and was assisted with donning/doffing brace, educated on need for L ankle brace rather than knee brace. Pt will continue to benefit from skilled PT to improve balance, gait, and strength in order to improve functional mobility, safety with ADLs, reduce falls risk, and decrease caregiver burden.    Personal Factors and Comorbidities Age;Comorbidity 3+;Fitness;Past/Current Experience;Time  since onset of injury/illness/exacerbation;Transportation    Comorbidities Parkinson's with autonomic instability, asthma, GERD depression, degenerative spondylolisthesis, focal seizures and migraine syndrome    Examination-Activity Limitations Bathing;Bed Mobility;Bend;Caring for [employer];Locomotion Level;Lift;Hygiene/Grooming;Squat;Stairs;Stand;Toileting;Transfers    Examination-Participation Restrictions Church;Cleaning;Community Activity;Driving;Meal Prep;Medication Management;Laundry;Shop;Volunteer;Yard Work    [employer] Evolving/Moderate complexity    Rehab Potential Fair    PT Frequency 2x / week    PT Duration 12 weeks    PT Treatment/Interventions ADLs/Self Care Home Management;Biofeedback;Canalith Repostioning;Cryotherapy;Parrafin;Ultrasound;Traction;Moist Heat;Iontophoresis 57m/ml Dexamethasone;Electrical Stimulation;DME Instruction;Gait training;Stair training;Functional mobility training;Neuromuscular re-education;Balance training;Therapeutic exercise;Therapeutic activities;Patient/family education;Orthotic Fit/Training;Manual techniques;Passive range of motion;Manual lymph drainage;Energy conservation;Splinting;Taping;Vasopneumatic Device;Vestibular;Joint Manipulations;Visual/perceptual remediation/compensation    PT Next Visit Plan Increase cognitive demand/dual tasking during gait, progress thoracic opening and rotaiton exercises, core strengthening, progress dynamic balance and gait training for increased BOS and dual tasking. Add core strengthening to HEP. Add dynadisc tasks with backwards rotational component.    PT Home Exercise Plan No updates today    Consulted and Agree with Plan of Care Patient    Family Member Consulted wife             Patient will benefit from skilled therapeutic intervention in order to improve the following deficits and impairments:  Abnormal gait, Decreased activity tolerance, Decreased balance,  Decreased knowledge of precautions, Decreased endurance, Decreased coordination, Decreased cognition, Decreased knowledge of use of DME, Decreased mobility, Decreased safety awareness, Difficulty walking, Decreased strength, Impaired flexibility, Impaired perceived functional ability, Impaired sensation, Impaired tone, Impaired UE functional use, Impaired vision/preception, Improper body mechanics, Postural dysfunction  Visit Diagnosis: Muscle weakness (generalized)  Unsteadiness on feet  Other abnormalities of gait and mobility     Problem List Patient Active Problem List   Diagnosis Date Noted   [place] 05/24/[place]   [place]inson's disease ([place]    History of migraine headaches    Migraine syndrome    BPH (benign prostatic hyperplasia)    Depression    Degenerative spondylolisthesis 05/07/2016   MJanna Arch PT, DPT  03/23/2021, 12:33 PM  [id number] [address]Phone: [phone]  Fax:  [phone] Name: Cory GREWEMRN: [id number] of Birth: 712/30/1957

## 2021-03-25 ENCOUNTER — Other Ambulatory Visit: Payer: Self-pay

## 2021-03-25 ENCOUNTER — Ambulatory Visit: Payer: Medicare PPO

## 2021-03-25 ENCOUNTER — Ambulatory Visit: Payer: Medicare PPO | Admitting: Speech Pathology

## 2021-03-25 DIAGNOSIS — R2681 Unsteadiness on feet: Secondary | ICD-10-CM | POA: Diagnosis not present

## 2021-03-25 DIAGNOSIS — R278 Other lack of coordination: Secondary | ICD-10-CM

## 2021-03-25 DIAGNOSIS — M6281 Muscle weakness (generalized): Secondary | ICD-10-CM

## 2021-03-25 DIAGNOSIS — R41841 Cognitive communication deficit: Secondary | ICD-10-CM

## 2021-03-25 NOTE — Therapy (Signed)
[place] MAIN [facility] SERVICES [address] [place], [place], [address] Phone: [phone]   Fax:  [phone]  Physical Therapy Treatment  Patient Details  Name: Cory [name] MRN: [id number] Date of Birth: [birth year] Referring Provider (PT): Benay Spice   Encounter Date: 03/25/2021   PT End of Session - 03/25/21 1024     Visit Number 71    Number of Visits 23    Date for PT Re-Evaluation 05/20/21    Authorization Type [employer] PPO    Authorization Time Period Cert 11/09/74-19/50/93    PT Start Time 1100    PT Stop Time 1144    PT Time Calculation (min) 44 min    Equipment Utilized During Treatment Gait belt    Activity Tolerance Patient tolerated treatment well    Behavior During Therapy WFL for tasks assessed/performed             Past Medical History:  Diagnosis Date   Arthritis    hips and back   Asthma    as a baby-    GERD (gastroesophageal reflux disease)    rare -    Parkinson's disease ([place])    Pneumonia    age 65   Rash    legs- on going    Past Surgical History:  Procedure Laterality Date   APPENDECTOMY     BACK SURGERY     posterior lumbar spine fusion L4-5   COLONOSCOPY     COLONOSCOPY WITH PROPOFOL N/A 06/06/2017   Procedure: COLONOSCOPY WITH PROPOFOL;  Surgeon: Manya Silvas, MD;  Location: [facility] ENDOSCOPY;  Service: Endoscopy;  Laterality: N/A;   KNEE ARTHROSCOPY Bilateral    ACL   KNEE ARTHROSCOPY W/ ACL RECONSTRUCTION     RADIAL KERATOTOMY     ROTATOR CUFF REPAIR Bilateral    SHOULDER ARTHROSCOPY WITH BICEPS TENDON REPAIR Left 04/16/2019   Procedure: SHOULDER ARTHROSCOPY WITH BICEPS TENDON REPAIR, MINI OPEN SUPERIOR CAPSULAR RECONSTRUCTION, BICEPS TENODESIS;  Surgeon: Leim Fabry, MD;  Location: [facility] ORS;  Service: Orthopedics;  Laterality: Left;    There were no vitals filed for this visit.   Subjective Assessment - 03/25/21 1023     Subjective Patient fell into brick hearth  since last session. Has had three almost falls this morning in the shower.    Patient is accompained by: Family member    Pertinent History Patient admitted to hospital 05/23/20 with code stroke and R sided weakness. Imaging negative for acute changes. PMH includes Parkinson's with autonomic instability, asthma, GERD depression, degenerative spondylolisthesis, focal seizures and migraine syndrome. Patient had three ED visits for falls since then. Patient reports when he has the seizures he has pain in his temples and bright flashes in his vision. Last week had three falls in one day.    Limitations Lifting;Standing;Walking;House hold activities    How long can you sit comfortably? sleeps in easy chair.    How long can you stand comfortably? feel unsteady once in standing.    How long can you walk comfortably? difficult to walk during "off period"    Patient Stated Goals stability, have a device that will be most helpful, help to become and stay independent.             Treatment:  Nustep Lvl 3 RPM> 60 for cardiovascular and musculoskeletal challenge x 6 minutes, frequent cuing to keep RPM> 60  Ambulate 2x122f with Ustep walker working on stepping on line, widened BOS, keeping walker closer  to body, and upright posture.   Stand<>sit<>stand practiced with U-walker; cuing for keeping walker near body until fully seated in chair, taking big steps during turns and backwards walking. X5 throughout session  Standing on [employer]irex at support surface:  - crossbody reaching and dual tasking with instruction to pick out all the orange magnets and move across the board, then spell a word with them. Repeated starting from opposite side of board with blue magnets. - Alternating marches with BUE support cuing for wide BOS, visual cue for foot position provided, cuing for big steps. x4mn   Seated: BTB SL abduction/knee extension step outs x12 each LE BTB abduction x12 each LE     Pt educated  throughout session about proper posture and technique with exercises. Improved exercise technique, movement at target joints, use of target muscles after min to mod verbal, visual, tactile cues.  Patient further trained on appropriate technique with U-step Walker, especially sit<>stand transitions for safety. Patient continues to demonstrate increasing BLE ankle eversion (L>R) during gait and standing activities as he fatigues, as well as increased episodes of stutter stepping and stumbles. Patient had recent lab draw for neurologist and will follow-up with MD in the new year. Patient will continue to benefit from skilled PT to improve coordination, gait deviations, and balance to improve functional mobility, independence with ADLs, and safety with ambulation.               PT Education - 03/25/21 1024     Education Details exercise technique    Person(s) Educated Patient    Methods Explanation;Demonstration;Tactile cues;Verbal cues    Comprehension Verbal cues required;Returned demonstration;Verbalized understanding;Tactile cues required              PT Short Term Goals - 10/21/20 0903       PT SHORT TERM GOAL #1   Title Patient will be independent in home exercise program to improve strength/mobility for better functional independence with ADLs.    Baseline 6/16: HEP to be given next session 7/19: HEP compliant    Time 4    Period Weeks    Status Achieved    Target Date 10/16/20               PT Long Term Goals - 02/25/21 1125       PT LONG TERM GOAL #1   Title Patient will increase FOTO score to equal to or greater than 51%    to demonstrate statistically significant improvement in mobility and quality of life.    Baseline 6/16: 48% 7/19: 57% 8/31: 50% 11/23: 53%    Time 12    Period Weeks    Status Achieved      PT LONG TERM GOAL #2   Title Patient will increase Berg Balance score by > 6 points (34/56)  to demonstrate decreased fall risk during functional  activities.    Baseline 6/16: 28/56 7/19: 45/56    Time 12    Period Weeks    Status Achieved      PT LONG TERM GOAL #3   Title Patient will deny any falls over past 4 weeks to demonstrate improved safety awareness at home and work.    Baseline 6/16: multiple falls 7/19: no falls reported 9/7: 3 falls in past month; 2 falls in past month 11/23: 1 fall past month    Time 12    Period Weeks    Status Partially Met    Target Date 05/20/21  PT LONG TERM GOAL #4   Title Patient will increase six minute walk test distance to >1300 for progression to age norm community ambulator and improve gait ability    Baseline 6/16: 945 ft with multiple near falls 7/19: 1495 ft    Time 12    Period Weeks    Status Achieved      PT LONG TERM GOAL #5   Title Patient will increase Berg Balance score by > 6 points (51/56)  to demonstrate decreased fall risk during functional activities.    Baseline 7/19: 45/56 8/31: 49/56; 10/14: 52/56 11/23: 52/56    Time 12    Period Weeks    Status Partially Met    Target Date 05/20/21      Additional Long Term Goals   Additional Long Term Goals Yes      PT LONG TERM GOAL #6   Title Patient will increase Functional Gait Assessment score to >20/30 as to reduce fall risk and improve dynamic gait safety with community ambulation.    Baseline 7/19: 16/30 8/31: 20/30;    Time 12    Period Weeks    Status Achieved      PT LONG TERM GOAL #7   Title Patient will increase Functional Gait Assessment score to >26/30 as to reduce fall risk and improve dynamic gait safety with community ambulation.    Baseline 8/31: 20/30; 10/14: 22/30 11/23: 24/30    Time 12    Period Weeks    Status Partially Met    Target Date 05/20/21      PT LONG TERM GOAL #8   Title Patient will static stand for approximately 5 minutes and perturbations for shooting/teaching classes without loss of balance or requiring object to lean on.    Baseline Unable to perform without leaning on  objects; 10/14: Still unable to perform without leaning on objects. 11/23: able to perform 5 minutes    Time 12    Period Weeks    Status Achieved    Target Date 05/20/21      PT LONG TERM GOAL  #9   TITLE Patient will be able to ambulate approximately 1 mile walking dog across changing surfaces such as grass, concrete, and sidewalk without loss of balance.    Baseline 9/7: unable to perform yet; 10/14: Unable 11/23: has not been able to attempt due to recent increase in "off times"    Time 12    Period Weeks    Status On-going    Target Date 05/20/21      PT LONG TERM GOAL  #10   TITLE Patient will bend down and pick up items without losing balance to perform iADLs such as emptying dishwasher and feeding dog.    Baseline 9/7: unable to perform; 10/14: Performing at home without falls or LOB.    Time 12    Period Weeks    Status Achieved      PT LONG TERM GOAL  #11   TITLE Patient will increase lower extremity functional scale to >60/80 to demonstrate improved functional mobility and increased tolerance with ADLs.    Baseline 11/23: 44    Time 12    Period Weeks    Status New    Target Date 05/20/21      PT LONG TERM GOAL  #12   TITLE Patient will static stand and reach into pocket to grab item without LOB for improved stability and mobility.    Baseline 11/23; LOB reaching into  pocket    Time 12    Period Weeks    Status New    Target Date 05/20/21                   Plan - 03/25/21 1024     Personal Factors and Comorbidities Age;Comorbidity 3+;Fitness;Past/Current Experience;Time since onset of injury/illness/exacerbation;Transportation    Comorbidities Parkinson's with autonomic instability, asthma, GERD depression, degenerative spondylolisthesis, focal seizures and migraine syndrome    Examination-Activity Limitations Bathing;Bed Mobility;Bend;Caring for [employer];Locomotion  Level;Lift;Hygiene/Grooming;Squat;Stairs;Stand;Toileting;Transfers    Examination-Participation Restrictions Church;Cleaning;Community Activity;Driving;Meal Prep;Medication Management;Laundry;Shop;Volunteer;Yard Work    Merchant [employer] officer Evolving/Moderate complexity    Rehab Potential Fair    PT Frequency 2x / week    PT Duration 12 weeks    PT Treatment/Interventions ADLs/Self Care Home Management;Biofeedback;Canalith Repostioning;Cryotherapy;Parrafin;Ultrasound;Traction;Moist Heat;Iontophoresis 42m/ml Dexamethasone;Electrical Stimulation;DME Instruction;Gait training;Stair training;Functional mobility training;Neuromuscular re-education;Balance training;Therapeutic exercise;Therapeutic activities;Patient/family education;Orthotic Fit/Training;Manual techniques;Passive range of motion;Manual lymph drainage;Energy conservation;Splinting;Taping;Vasopneumatic Device;Vestibular;Joint Manipulations;Visual/perceptual remediation/compensation    PT Next Visit Plan Increase cognitive demand/dual tasking during gait, progress thoracic opening and rotaiton exercises, core strengthening, progress dynamic balance and gait training for increased BOS and dual tasking. Add core strengthening to HEP. Add dynadisc tasks with backwards rotational component.    PT Home Exercise Plan No updates today    Consulted and Agree with Plan of Care Patient    Family Member Consulted wife             Patient will benefit from skilled therapeutic intervention in order to improve the following deficits and impairments:  Abnormal gait, Decreased activity tolerance, Decreased balance, Decreased knowledge of precautions, Decreased endurance, Decreased coordination, Decreased cognition, Decreased knowledge of use of DME, Decreased mobility, Decreased safety awareness, Difficulty walking, Decreased strength, Impaired flexibility, Impaired perceived functional ability, Impaired sensation, Impaired tone, Impaired UE  functional use, Impaired vision/preception, Improper body mechanics, Postural dysfunction  Visit Diagnosis: Muscle weakness (generalized)  Unsteadiness on feet  Other lack of coordination     Problem List Patient Active Problem List   Diagnosis Date Noted   [place]ocal [place] ([place] 05/24/2020   [place]inson's disease ([place]    History of migraine headaches    Migraine syndrome    BPH (benign prostatic hyperplasia)    Depression    Degenerative spondylolisthesis 05/07/2016   MArsenio Katz SPT  This entire session was performed under direct supervision and direction of a licensed therapist/therapist assistant . I have personally read, edited and approve of the note as written.  MJanna Arch PT, DPT  03/25/2021, 10:25 AM  [place] [facility] [address] [address]Phone: [phone]  Fax:  [phone] Name: Cory WAASMRN: [id number] of Birth: [birth year]

## 2021-03-25 NOTE — Therapy (Signed)
[name] MAIN [facility] SERVICES [address] [place], [place], [address] Phone: [phone]   Fax:  [phone]  Speech Language Pathology Treatment  Patient Details  Name: Cory [name] MRN: [id number] Date of Birth: 04/21/1955 Referring Provider (SLP): Cory Lab III, MD   Encounter Date: 03/25/2021   End of Session - 03/25/21 1216     Visit Number 5    Number of Visits 9    Date for SLP Re-Evaluation 05/05/21    Authorization Type [employer]    SLP Start Time 1000    SLP Stop Time  1    SLP Time Calculation (min) 65 min    Activity Tolerance Patient tolerated treatment well             Past Medical History:  Diagnosis Date   Arthritis    hips and back   Asthma    as a baby-    GERD (gastroesophageal reflux disease)    rare -    Parkinson's disease ([place])    Pneumonia    age 65   Rash    legs- on going    Past Surgical History:  Procedure Laterality Date   APPENDECTOMY     BACK SURGERY     posterior lumbar spine fusion L4-5   COLONOSCOPY     COLONOSCOPY WITH PROPOFOL N/A 06/06/2017   Procedure: COLONOSCOPY WITH PROPOFOL;  Surgeon: Manya Silvas, MD;  Location: [facility] ENDOSCOPY;  Service: Endoscopy;  Laterality: N/A;   KNEE ARTHROSCOPY Bilateral    ACL   KNEE ARTHROSCOPY W/ ACL RECONSTRUCTION     RADIAL KERATOTOMY     ROTATOR CUFF REPAIR Bilateral    SHOULDER ARTHROSCOPY WITH BICEPS TENDON REPAIR Left 04/16/2019   Procedure: SHOULDER ARTHROSCOPY WITH BICEPS TENDON REPAIR, MINI OPEN SUPERIOR CAPSULAR RECONSTRUCTION, BICEPS TENODESIS;  Surgeon: Leim Fabry, MD;  Location: [facility] ORS;  Service: Orthopedics;  Laterality: Left;    There were no vitals filed for this visit.   Subjective Assessment - 03/25/21 1151     Subjective Had labs drawn today given worsening PD symptoms over the last 10 days (balance, cognition, tremors)    Patient is accompained by: Family member   Cory [name]   Currently in Pain? No/denies                    ADULT SLP TREATMENT - 03/25/21 1152       General Information   Behavior/Cognition Alert;Cooperative      Treatment Provided   Treatment provided Cognitive-Linquistic      Cognitive-Linquistic Treatment   Treatment focused on Cognition;Patient/family/caregiver education    Skilled Treatment Pt had turned off med alarms; assisted with resetting alarms and demo'd how to stop alarm but not turn off recurring setting. Pt able to return demonstration, however having significant difficulty manipulating phone today. Somewhat improved with use of stylus. Pt appears very determined to use his cell phone for schedule and alerts; also exhibits frustration, discouragement when he is not able to operate these as he expects. Pt verbalizing frustration with his symptoms and stating he is "stupid" today. Continue to reinforce that pt symptoms are not indicative of "stupidity." Affirmed/validated his frustration and continue to educate, work with pt to identify ways to support him in completing tasks he enjoys and tasks that support his health and overall function. Assisted pt with accessing voice assistant; pt has been unable to use voice typing feature which is not available on his keyboard. Assisted pt with  downloading a Camera operator. Pt reports he will review this at home.      Assessment / Recommendations / Plan   Plan Continue with current plan of care      Progression Toward Goals   Progression toward goals Not progressing toward goals (comment)   pt frustration, ? depression/acceptance limiting willingness to accept alternative strategies             SLP Education - 03/25/21 1216     Education Details accessibility options for phone, use of alerts    Person(s) Educated Patient;Spouse    Methods Explanation    Comprehension Verbalized understanding                SLP Long Term Goals - 02/04/21 1220       SLP LONG TERM GOAL #1   Title Pt will use daily  schedule to prepare in advance for scheduled activities/outings with min caregiver cues.    Time 4    Period Weeks    Status New    Target Date 05/05/21      SLP LONG TERM GOAL #2   Title Pt/spouse will report no missed medications over 1 week period using medication management system/strategies.    Time 4    Period Weeks    Status New    Target Date 05/05/21      SLP LONG TERM GOAL #3   Title Pt/spouse will report using strategies to maximize communication effectiveness (reduce distractions, face-to-face conversation, chunking of information, repeat back information, etc).    Time 4    Period Weeks    Status New    Target Date 05/05/21              Plan - 03/25/21 1217     Clinical Impression Statement [name] presents with overall moderate cognitive communication deficits. Spouse reports pt has been reluctant to follow/use paper schedule. Attempting to work with pt to develop system for his phone. Despite pt preference to use his phone, he required significant assistance for this today and verbalized frustration with this. Spouse reports decline in balance, cognition over the 7-10 days. Had labwork drawn today per neurology to assess for infection vs disease progression. I recommend brief course of skilled ST to target use of strategies and compensations in specific functional tasks to maximize pt independence, improve safety, minimize frustration and reduce caregiver burden.    Speech Therapy Frequency 1x /week   adjust to 1x per week for 8 weeks due to scheduling   Duration 8 weeks    Treatment/Interventions Environmental controls;Cueing hierarchy;SLP instruction and feedback;Compensatory strategies;Functional tasks;Internal/external aids;Patient/family education    Potential to Achieve Goals Fair    Potential Considerations Ability to learn/carryover information;Severity of impairments;Previous level of function    SLP Home Exercise Plan to be provided next session     Consulted and Agree with Plan of Care Patient;Family member/caregiver    Family Member Consulted Wife             Patient will benefit from skilled therapeutic intervention in order to improve the following deficits and impairments:   Cognitive communication deficit    Problem List Patient Active Problem List   Diagnosis Date Noted   Focal seizure ([place]) 05/24/2020   Parkinson's disease ([place])    History of migraine headaches    Migraine syndrome    BPH (benign prostatic hyperplasia)    Depression    Degenerative spondylolisthesis 05/07/2016   Deneise Lever, [place], [place] Speech-Language  Pathologist  Aliene Altes, [place] 03/25/2021, 12:20 PM  [place] MAIN [facility] SERVICES [address] [place], [place], [address] Phone: [phone]   Fax:  [phone]   Name: Cory [name] MRN: [id number] Date of Birth: Aug 12, 1955

## 2021-04-01 ENCOUNTER — Ambulatory Visit: Payer: Medicare PPO

## 2021-04-01 ENCOUNTER — Other Ambulatory Visit: Payer: Self-pay

## 2021-04-01 ENCOUNTER — Ambulatory Visit: Payer: Medicare PPO | Admitting: Speech Pathology

## 2021-04-01 DIAGNOSIS — G20 Parkinson's disease: Secondary | ICD-10-CM

## 2021-04-01 DIAGNOSIS — R278 Other lack of coordination: Secondary | ICD-10-CM

## 2021-04-01 DIAGNOSIS — R2681 Unsteadiness on feet: Secondary | ICD-10-CM | POA: Diagnosis not present

## 2021-04-01 DIAGNOSIS — R2689 Other abnormalities of gait and mobility: Secondary | ICD-10-CM

## 2021-04-01 NOTE — Therapy (Signed)
[place] MAIN [facility] SERVICES [address] [place], [place], [address] Phone: [phone]   Fax:  [phone]  Physical Therapy Treatment  Patient Details  Name: Cory [name] MRN: [id number] Date of Birth: 21-Jan-1956 Referring Provider (PT): Benay Spice   Encounter Date: 04/01/2021   PT End of Session - 04/01/21 1202     Visit Number 63    Number of Visits 76    Date for PT Re-Evaluation 05/20/21    Authorization Type [employer] PPO    Authorization Time Period Cert 2/0/25-42/70/62    PT Start Time 1108    PT Stop Time 1144    PT Time Calculation (min) 36 min    Equipment Utilized During Treatment Gait belt    Activity Tolerance Patient tolerated treatment well    Behavior During Therapy WFL for tasks assessed/performed             Past Medical History:  Diagnosis Date   Arthritis    hips and back   Asthma    as a baby-    GERD (gastroesophageal reflux disease)    rare -    Parkinson's disease ([place])    Pneumonia    age 1   Rash    legs- on going    Past Surgical History:  Procedure Laterality Date   APPENDECTOMY     BACK SURGERY     posterior lumbar spine fusion L4-5   COLONOSCOPY     COLONOSCOPY WITH PROPOFOL N/A 06/06/2017   Procedure: COLONOSCOPY WITH PROPOFOL;  Surgeon: Manya Silvas, MD;  Location: [facility] ENDOSCOPY;  Service: Endoscopy;  Laterality: N/A;   KNEE ARTHROSCOPY Bilateral    ACL   KNEE ARTHROSCOPY W/ ACL RECONSTRUCTION     RADIAL KERATOTOMY     ROTATOR CUFF REPAIR Bilateral    SHOULDER ARTHROSCOPY WITH BICEPS TENDON REPAIR Left 04/16/2019   Procedure: SHOULDER ARTHROSCOPY WITH BICEPS TENDON REPAIR, MINI OPEN SUPERIOR CAPSULAR RECONSTRUCTION, BICEPS TENODESIS;  Surgeon: Leim Fabry, MD;  Location: [facility] ORS;  Service: Orthopedics;  Laterality: Left;    There were no vitals filed for this visit.   Subjective Assessment - 04/01/21 1200     Subjective Pt reports no falls or pain  currently. He does report, however, some stumbles when using his SPC at home.    Patient is accompained by: Family member    Pertinent History Patient admitted to hospital 05/23/20 with code stroke and R sided weakness. Imaging negative for acute changes. PMH includes Parkinson's with autonomic instability, asthma, GERD depression, degenerative spondylolisthesis, focal seizures and migraine syndrome. Patient had three ED visits for falls since then. Patient reports when he has the seizures he has pain in his temples and bright flashes in his vision. Last week had three falls in one day.    Limitations Lifting;Standing;Walking;House hold activities    How long can you sit comfortably? sleeps in easy chair.    How long can you stand comfortably? feel unsteady once in standing.    How long can you walk comfortably? difficult to walk during "off period"    Patient Stated Goals stability, have a device that will be most helpful, help to become and stay independent.    Currently in Pain? No/denies    Pain Onset More than a month ago             Treatment:   Nustep Lvl 4 RPM> 60 for cardiovascular and musculoskeletal challenge x 6 minutes, only minimal cuing  required for RPM> 60. Progressed intensity from last appointment.   STS to U-step walker 1x10. Cuing for safe technique  Pt ambulates 2x152 ft with Ustep walker, continued focus on stepping on line, keeping walker closer to body. Pt requires cuing only 20% of the time today. Pt reports fatigue following second set, requires rest interval.        Neuro Re-ed- gait belt donned with CGA provided throughout unless otherwise specified   airex pad:  EO, WBOS 30 sec EC, WBOS, 30 sec EO, NBOS, 30 sec EC, NBOS,2x30 sec  EC with alternating UE raises 10x for 2 rounds, performed 1x10 with EO  EO slow marches 20x difficulty picking up RLE, exhibits some freezing  Continued interventions on airex: WBOS, table placed in front of pt, pt cued to  stack and unstack cones, pt must bend and reach --progressed to performing with NBOS --progressed to performing with dual cog task --progressed to performing with cross body reaches/trunk twists and stacking with RUE and LUE/handing cones to PT Comments: pt somewhat challenged with addition of dual cog task, but is able to maintain balance throughout.   Pt educated throughout session about proper posture and technique with exercises. Improved exercise technique, movement at target joints, use of target muscles after min to mod verbal, visual, tactile cues.     PT Education - 04/01/21 1201     Education Details exercise technique. body mechanics    Person(s) Educated Patient    Methods Explanation;Demonstration;Verbal cues    Comprehension Verbalized understanding;Returned demonstration;Verbal cues required;Need further instruction              PT Short Term Goals - 10/21/20 0903       PT SHORT TERM GOAL #1   Title Patient will be independent in home exercise program to improve strength/mobility for better functional independence with ADLs.    Baseline 6/16: HEP to be given next session 7/19: HEP compliant    Time 4    Period Weeks    Status Achieved    Target Date 10/16/20               PT Long Term Goals - 02/25/21 1125       PT LONG TERM GOAL #1   Title Patient will increase FOTO score to equal to or greater than 51%    to demonstrate statistically significant improvement in mobility and quality of life.    Baseline 6/16: 48% 7/19: 57% 8/31: 50% 11/23: 53%    Time 12    Period Weeks    Status Achieved      PT LONG TERM GOAL #2   Title Patient will increase Berg Balance score by > 6 points (34/56)  to demonstrate decreased fall risk during functional activities.    Baseline 6/16: 28/56 7/19: 45/56    Time 12    Period Weeks    Status Achieved      PT LONG TERM GOAL #3   Title Patient will deny any falls over past 4 weeks to demonstrate improved safety  awareness at home and work.    Baseline 6/16: multiple falls 7/19: no falls reported 9/7: 3 falls in past month; 2 falls in past month 11/23: 1 fall past month    Time 12    Period Weeks    Status Partially Met    Target Date 05/20/21      PT LONG TERM GOAL #4   Title Patient will increase six minute walk test distance to >  1300 for progression to age norm community ambulator and improve gait ability    Baseline 6/16: 945 ft with multiple near falls 7/19: 1495 ft    Time 12    Period Weeks    Status Achieved      PT LONG TERM GOAL #5   Title Patient will increase Berg Balance score by > 6 points (51/56)  to demonstrate decreased fall risk during functional activities.    Baseline 7/19: 45/56 8/31: 49/56; 10/14: 52/56 11/23: 52/56    Time 12    Period Weeks    Status Partially Met    Target Date 05/20/21      Additional Long Term Goals   Additional Long Term Goals Yes      PT LONG TERM GOAL #6   Title Patient will increase Functional Gait Assessment score to >20/30 as to reduce fall risk and improve dynamic gait safety with community ambulation.    Baseline 7/19: 16/30 8/31: 20/30;    Time 12    Period Weeks    Status Achieved      PT LONG TERM GOAL #7   Title Patient will increase Functional Gait Assessment score to >26/30 as to reduce fall risk and improve dynamic gait safety with community ambulation.    Baseline 8/31: 20/30; 10/14: 22/30 11/23: 24/30    Time 12    Period Weeks    Status Partially Met    Target Date 05/20/21      PT LONG TERM GOAL #8   Title Patient will static stand for approximately 5 minutes and perturbations for shooting/teaching classes without loss of balance or requiring object to lean on.    Baseline Unable to perform without leaning on objects; 10/14: Still unable to perform without leaning on objects. 11/23: able to perform 5 minutes    Time 12    Period Weeks    Status Achieved    Target Date 05/20/21      PT LONG TERM GOAL  #9   TITLE  Patient will be able to ambulate approximately 1 mile walking dog across changing surfaces such as grass, concrete, and sidewalk without loss of balance.    Baseline 9/7: unable to perform yet; 10/14: Unable 11/23: has not been able to attempt due to recent increase in "off times"    Time 12    Period Weeks    Status On-going    Target Date 05/20/21      PT LONG TERM GOAL  #10   TITLE Patient will bend down and pick up items without losing balance to perform iADLs such as emptying dishwasher and feeding dog.    Baseline 9/7: unable to perform; 10/14: Performing at home without falls or LOB.    Time 12    Period Weeks    Status Achieved      PT LONG TERM GOAL  #11   TITLE Patient will increase lower extremity functional scale to >60/80 to demonstrate improved functional mobility and increased tolerance with ADLs.    Baseline 11/23: 44    Time 12    Period Weeks    Status New    Target Date 05/20/21      PT LONG TERM GOAL  #12   TITLE Patient will static stand and reach into pocket to grab item without LOB for improved stability and mobility.    Baseline 11/23; LOB reaching into pocket    Time 12    Period Weeks    Status New  Target Date 05/20/21                   Plan - 04/01/21 1209     Clinical Impression Statement Session slightly limited secondary to pt arrival time, pt reporting he has had a slow start to his day. However, pt highly motivated to participate in interventions. Pt requires minimal cuing for technique with Ustep walker today, but does fatigue quickly with ambulation. Pt also able to progress in level of complexity with balance exercises with addition of dual cog task. This did challenge pt somewhat, as he had some difficulty performing both tasks simulatenously, but did not lose his balance. The pt will continue to benefit from skilled PT to improve coordnation, gait, and balance to increase ease and safety with ADLs.    Personal Factors and  Comorbidities Age;Comorbidity 3+;Fitness;Past/Current Experience;Time since onset of injury/illness/exacerbation;Transportation    Comorbidities Parkinson's with autonomic instability, asthma, GERD depression, degenerative spondylolisthesis, focal seizures and migraine syndrome    Examination-Activity Limitations Bathing;Bed Mobility;Bend;Caring for [employer];Locomotion Level;Lift;Hygiene/Grooming;Squat;Stairs;Stand;Toileting;Transfers    Examination-Participation Restrictions Church;Cleaning;Community Activity;Driving;Meal Prep;Medication Management;Laundry;Shop;Volunteer;Yard Work    [employer] Evolving/Moderate complexity    Rehab Potential Fair    PT Frequency 2x / week    PT Duration 12 weeks    PT Treatment/Interventions ADLs/Self Care Home Management;Biofeedback;Canalith Repostioning;Cryotherapy;Parrafin;Ultrasound;Traction;Moist Heat;Iontophoresis 31m/ml Dexamethasone;Electrical Stimulation;DME Instruction;Gait training;Stair training;Functional mobility training;Neuromuscular re-education;Balance training;Therapeutic exercise;Therapeutic activities;Patient/family education;Orthotic Fit/Training;Manual techniques;Passive range of motion;Manual lymph drainage;Energy conservation;Splinting;Taping;Vasopneumatic Device;Vestibular;Joint Manipulations;Visual/perceptual remediation/compensation    PT Next Visit Plan Increase cognitive demand/dual tasking during gait, progress thoracic opening and rotaiton exercises, core strengthening, progress dynamic balance and gait training for increased BOS and dual tasking. Add core strengthening to HEP. Add dynadisc tasks with backwards rotational component.    PT Home Exercise Plan No updates today    Consulted and Agree with Plan of Care Patient    Family Member Consulted wife             Patient will benefit from skilled therapeutic intervention in order to improve the following deficits and  impairments:  Abnormal gait, Decreased activity tolerance, Decreased balance, Decreased knowledge of precautions, Decreased endurance, Decreased coordination, Decreased cognition, Decreased knowledge of use of DME, Decreased mobility, Decreased safety awareness, Difficulty walking, Decreased strength, Impaired flexibility, Impaired perceived functional ability, Impaired sensation, Impaired tone, Impaired UE functional use, Impaired vision/preception, Improper body mechanics, Postural dysfunction  Visit Diagnosis: Other abnormalities of gait and mobility  Unsteadiness on feet  Other lack of coordination  Parkinson's disease ([facility]     Problem List Patient Active Problem List   Diagnosis Date Noted   [place]   [place]'s [place]    History of migraine headaches    Migraine syndrome    BPH (benign prostatic hyperplasia)    Depression    Degenerative spondylolisthesis 05/07/2016    HZollie Pee PT 04/01/2021, 12:15 PM  [place] StreetRFlora NAl[place] [address]Phone: [phone]  Fax:  [phone] Name: TLISANDRO MEGGETTMRN: [id number] of Birth: 7Jul 21, 1957

## 2021-04-08 ENCOUNTER — Ambulatory Visit: Payer: Medicare PPO | Admitting: Speech Pathology

## 2021-04-08 ENCOUNTER — Ambulatory Visit: Payer: Medicare PPO

## 2021-04-08 ENCOUNTER — Ambulatory Visit: Payer: Medicare PPO | Attending: Internal Medicine

## 2021-04-08 ENCOUNTER — Other Ambulatory Visit: Payer: Self-pay

## 2021-04-08 DIAGNOSIS — G20 Parkinson's disease: Secondary | ICD-10-CM | POA: Diagnosis present

## 2021-04-08 DIAGNOSIS — R2689 Other abnormalities of gait and mobility: Secondary | ICD-10-CM | POA: Insufficient documentation

## 2021-04-08 DIAGNOSIS — R471 Dysarthria and anarthria: Secondary | ICD-10-CM | POA: Diagnosis present

## 2021-04-08 DIAGNOSIS — R2681 Unsteadiness on feet: Secondary | ICD-10-CM | POA: Diagnosis present

## 2021-04-08 DIAGNOSIS — M6281 Muscle weakness (generalized): Secondary | ICD-10-CM | POA: Diagnosis present

## 2021-04-08 DIAGNOSIS — R41841 Cognitive communication deficit: Secondary | ICD-10-CM | POA: Diagnosis present

## 2021-04-08 DIAGNOSIS — R278 Other lack of coordination: Secondary | ICD-10-CM | POA: Insufficient documentation

## 2021-04-08 NOTE — Therapy (Signed)
[place] MAIN [facility] SERVICES [address] [place], [place], [address] Phone: [phone]   Fax:  [phone]  Physical Therapy Treatment Physical Therapy Progress Note   Dates of reporting period  02/25/21   to   04/08/21   Patient Details  Name: MISSAEL FERRARI MRN: [id number] Date of Birth: [birth year] Referring Provider (PT): Benay Spice   Encounter Date: 04/08/2021   PT End of Session - 04/08/21 1203     Visit Number 50    Number of Visits 63    Date for PT Re-Evaluation 05/20/21    Authorization Type [employer] PPO; next session 1/10 PN 04/08/20    Authorization Time Period Cert 12/07/68-96/28/36    PT Start Time 1101    PT Stop Time 1146    PT Time Calculation (min) 45 min    Equipment Utilized During Treatment Gait belt    Activity Tolerance Patient tolerated treatment well    Behavior During Therapy WFL for tasks assessed/performed             Past Medical History:  Diagnosis Date   Arthritis    hips and back   Asthma    as a baby-    GERD (gastroesophageal reflux disease)    rare -    Parkinson's disease ([place])    Pneumonia    age 62   Rash    legs- on going    Past Surgical History:  Procedure Laterality Date   APPENDECTOMY     BACK SURGERY     posterior lumbar spine fusion L4-5   COLONOSCOPY     COLONOSCOPY WITH PROPOFOL N/A 06/06/2017   Procedure: COLONOSCOPY WITH PROPOFOL;  Surgeon: Manya Silvas, MD;  Location: [facility] ENDOSCOPY;  Service: Endoscopy;  Laterality: N/A;   KNEE ARTHROSCOPY Bilateral    ACL   KNEE ARTHROSCOPY W/ ACL RECONSTRUCTION     RADIAL KERATOTOMY     ROTATOR CUFF REPAIR Bilateral    SHOULDER ARTHROSCOPY WITH BICEPS TENDON REPAIR Left 04/16/2019   Procedure: SHOULDER ARTHROSCOPY WITH BICEPS TENDON REPAIR, MINI OPEN SUPERIOR CAPSULAR RECONSTRUCTION, BICEPS TENODESIS;  Surgeon: Leim Fabry, MD;  Location: [facility] ORS;  Service: Orthopedics;  Laterality: Left;    There were no  vitals filed for this visit.   Subjective Assessment - 04/08/21 1107     Subjective Patient had bloodwork done and has been seen to have increased white blood cell count. Wife reports patient has had increased confusion and increase falls. Has had four falls since last session.    Patient is accompained by: Family member    Pertinent History Patient admitted to hospital 05/23/20 with code stroke and R sided weakness. Imaging negative for acute changes. PMH includes Parkinson's with autonomic instability, asthma, GERD depression, degenerative spondylolisthesis, focal seizures and migraine syndrome. Patient had three ED visits for falls since then. Patient reports when he has the seizures he has pain in his temples and bright flashes in his vision. Last week had three falls in one day.    Limitations Lifting;Standing;Walking;House hold activities    How long can you sit comfortably? sleeps in easy chair.    How long can you stand comfortably? feel unsteady once in standing.    How long can you walk comfortably? difficult to walk during "off period"    Patient Stated Goals stability, have a device that will be most helpful, help to become and stay independent.    Currently in Pain? No/denies  [facility] PT Assessment - 04/08/21 0001       Standardized Balance Assessment   Standardized Balance Assessment Berg Balance Test      Berg Balance Test   Sit to Stand Able to stand without using hands and stabilize independently    Standing Unsupported Able to stand 2 minutes with supervision    Sitting with Back Unsupported but Feet Supported on Floor or Stool Able to sit safely and securely 2 minutes    Stand to Sit Sits safely with minimal use of hands    Transfers Able to transfer safely, minor use of hands    Standing Unsupported with Eyes Closed Able to stand 10 seconds with supervision    Standing Unsupported with Feet Together Able to place feet together independently and stand  for 1 minute with supervision    From Standing, Reach Forward with Outstretched Arm Can reach confidently >25 cm (10")    From Standing Position, Pick up Object from Floor Able to pick up shoe, needs supervision    From Standing Position, Turn to Look Behind Over each Shoulder Looks behind from both sides and weight shifts well    Turn 360 Degrees Able to turn 360 degrees safely but slowly    Standing Unsupported, Alternately Place Feet on Step/Stool Able to stand independently and complete 8 steps >20 seconds    Standing Unsupported, One Foot in Front Able to take small step independently and hold 30 seconds    Standing on One Leg Able to lift leg independently and hold 5-10 seconds    Total Score 46                 Goals:  Falls over past 4 weeks: at least 6  BERG: 46/56  FGA: deferred due to balance/high fall risk Ambulate with dog: unable to do yet  LEFS: 40/80  Static stand and reach to grab item from pocket.: able to perform with increased time   Treatment: Stand up and weave between four cones and sit down at chair 10 ft away; x 6 trials   ambulate 160 ft without AD with distractions and cues for widening BOS for dual task ; x 2 trials  Ambulating with PT tech walking across hallway in front of patient, initially causes freezing, improved with repetition x 4  Sit to stand jump 10x  Matrix leg press: #85 lb; cues for foot placement ; bilateral LE; 12x; 2 sets       Patient's condition has the potential to improve in response to therapy. Maximum improvement is yet to be obtained. The anticipated improvement is attainable and reasonable in a generally predictable time.  Patient reports he has been having more issues with safety awareness and dual tasks.   Pt educated throughout session about proper posture and technique with exercises. Improved exercise technique, movement at target joints, use of target muscles after min to mod verbal, visual, tactile  cues.  Patient has had regression with balance primarily due decreased safety awareness and increased "fogginess" He does have a decreased score on the BERG as well as increased fall rate additionally indicating regression of stability.  Patient and patient's wife encouraged to follow up with physician due to overall increase risk of falls. Patient's condition has the potential to improve in response to therapy. Maximum improvement is yet to be obtained. The anticipated improvement is attainable and reasonable in a generally predictable time.  The pt will continue to benefit from skilled PT to improve coordnation, gait,  and balance to increase ease and safety with ADLs.           PT Education - 04/08/21 1123     Education Details goals, progress note    Person(s) Educated Patient    Methods Explanation;Demonstration;Tactile cues;Verbal cues    Comprehension Verbalized understanding;Returned demonstration;Verbal cues required;Tactile cues required              PT Short Term Goals - 10/21/20 0903       PT SHORT TERM GOAL #1   Title Patient will be independent in home exercise program to improve strength/mobility for better functional independence with ADLs.    Baseline 6/16: HEP to be given next session 7/19: HEP compliant    Time 4    Period Weeks    Status Achieved    Target Date 10/16/20               PT Long Term Goals - 04/08/21 1213       PT LONG TERM GOAL #1   Title Patient will increase FOTO score to equal to or greater than 51%    to demonstrate statistically significant improvement in mobility and quality of life.    Baseline 6/16: 48% 7/19: 57% 8/31: 50% 11/23: 53%    Time 12    Period Weeks    Status Achieved      PT LONG TERM GOAL #2   Title Patient will increase Berg Balance score by > 6 points (34/56)  to demonstrate decreased fall risk during functional activities.    Baseline 6/16: 28/56 7/19: 45/56    Time 12    Period Weeks    Status Achieved       PT LONG TERM GOAL #3   Title Patient will deny any falls over past 4 weeks to demonstrate improved safety awareness at home and work.    Baseline 6/16: multiple falls 7/19: no falls reported 9/7: 3 falls in past month; 2 falls in past month 11/23: 1 fall past month 1/4: at least 6    Time 12    Period Weeks    Status Partially Met    Target Date 05/20/21      PT LONG TERM GOAL #4   Title Patient will increase six minute walk test distance to >1300 for progression to age norm community ambulator and improve gait ability    Baseline 6/16: 945 ft with multiple near falls 7/19: 1495 ft    Time 12    Period Weeks    Status Achieved      PT LONG TERM GOAL #5   Title Patient will increase Berg Balance score by > 6 points (51/56)  to demonstrate decreased fall risk during functional activities.    Baseline 7/19: 45/56 8/31: 49/56; 10/14: 52/56 11/23: 52/56 1/4: 46/56    Time 12    Period Weeks    Status Partially Met    Target Date 05/20/21      PT LONG TERM GOAL #6   Title Patient will increase Functional Gait Assessment score to >20/30 as to reduce fall risk and improve dynamic gait safety with community ambulation.    Baseline 7/19: 16/30 8/31: 20/30;    Time 12    Period Weeks    Status Achieved      PT LONG TERM GOAL #7   Title Patient will increase Functional Gait Assessment score to >26/30 as to reduce fall risk and improve dynamic gait safety with community ambulation.  Baseline 8/31: 20/30; 10/14: 22/30 11/23: 24/30    Time 12    Period Weeks    Status Partially Met    Target Date 05/20/21      PT LONG TERM GOAL #8   Title Patient will static stand for approximately 5 minutes and perturbations for shooting/teaching classes without loss of balance or requiring object to lean on.    Baseline Unable to perform without leaning on objects; 10/14: Still unable to perform without leaning on objects. 11/23: able to perform 5 minutes    Time 12    Period Weeks    Status  Achieved    Target Date 05/20/21      PT LONG TERM GOAL  #9   TITLE Patient will be able to ambulate approximately 1 mile walking dog across changing surfaces such as grass, concrete, and sidewalk without loss of balance.    Baseline 9/7: unable to perform yet; 10/14: Unable 11/23: has not been able to attempt due to recent increase in "off times" 1/4 : unable    Time 12    Period Weeks    Status On-going    Target Date 05/20/21      PT LONG TERM GOAL  #10   TITLE Patient will bend down and pick up items without losing balance to perform iADLs such as emptying dishwasher and feeding dog.    Baseline 9/7: unable to perform; 10/14: Performing at home without falls or LOB.    Time 12    Period Weeks    Status Achieved      PT LONG TERM GOAL  #11   TITLE Patient will increase lower extremity functional scale to >60/80 to demonstrate improved functional mobility and increased tolerance with ADLs.    Baseline 11/23: 44 1/4: 40    Time 12    Period Weeks    Status New    Target Date 05/20/21      PT LONG TERM GOAL  #12   TITLE Patient will static stand and reach into pocket to grab item without LOB for improved stability and mobility.    Baseline 11/23; LOB reaching into pocket; 1/4: able to perform with only one episode of rocking with increased time    Time 12    Period Weeks    Status Partially Met    Target Date 05/20/21                   Plan - 04/08/21 1217     Clinical Impression Statement Patient has had regression with balance primarily due decreased safety awareness and increased "fogginess" He does have a decreased score on the BERG as well as increased fall rate additionally indicating regression of stability.  Patient and patient's wife encouraged to follow up with physician due to overall increase risk of falls. Patient's condition has the potential to improve in response to therapy. Maximum improvement is yet to be obtained. The anticipated improvement is  attainable and reasonable in a generally predictable time.  The pt will continue to benefit from skilled PT to improve coordnation, gait, and balance to increase ease and safety with ADLs.    Personal Factors and Comorbidities Age;Comorbidity 3+;Fitness;Past/Current Experience;Time since onset of injury/illness/exacerbation;Transportation    Comorbidities Parkinson's with autonomic instability, asthma, GERD depression, degenerative spondylolisthesis, focal seizures and migraine syndrome    Examination-Activity Limitations Bathing;Bed Mobility;Bend;Caring for [employer];Locomotion Level;Lift;Hygiene/Grooming;Squat;Stairs;Stand;Toileting;Transfers    Examination-Participation Restrictions Church;Cleaning;Community Activity;Driving;Meal Prep;Medication Management;Laundry;Shop;Volunteer;Yard Work    Merchant [employer] officer Evolving/Moderate complexity  Rehab Potential Fair    PT Frequency 2x / week    PT Duration 12 weeks    PT Treatment/Interventions ADLs/Self Care Home Management;Biofeedback;Canalith Repostioning;Cryotherapy;Parrafin;Ultrasound;Traction;Moist Heat;Iontophoresis 85m/ml Dexamethasone;Electrical Stimulation;DME Instruction;Gait training;Stair training;Functional mobility training;Neuromuscular re-education;Balance training;Therapeutic exercise;Therapeutic activities;Patient/family education;Orthotic Fit/Training;Manual techniques;Passive range of motion;Manual lymph drainage;Energy conservation;Splinting;Taping;Vasopneumatic Device;Vestibular;Joint Manipulations;Visual/perceptual remediation/compensation    PT Next Visit Plan Increase cognitive demand/dual tasking during gait, progress thoracic opening and rotaiton exercises, core strengthening, progress dynamic balance and gait training for increased BOS and dual tasking. Add core strengthening to HEP. Add dynadisc tasks with backwards rotational component.    PT Home Exercise Plan No updates today     Consulted and Agree with Plan of Care Patient    Family Member Consulted wife             Patient will benefit from skilled therapeutic intervention in order to improve the following deficits and impairments:  Abnormal gait, Decreased activity tolerance, Decreased balance, Decreased knowledge of precautions, Decreased endurance, Decreased coordination, Decreased cognition, Decreased knowledge of use of DME, Decreased mobility, Decreased safety awareness, Difficulty walking, Decreased strength, Impaired flexibility, Impaired perceived functional ability, Impaired sensation, Impaired tone, Impaired UE functional use, Impaired vision/preception, Improper body mechanics, Postural dysfunction  Visit Diagnosis: Other abnormalities of gait and mobility  Unsteadiness on feet  Other lack of coordination     Problem List Patient Active Problem List   Diagnosis Date Noted   [place] 05[place]   [place]    History of migraine headaches    Migraine syndrome    BPH (benign prostatic hyperplasia)    Depression    Degenerative spondylolisthesis 05/07/2016   MJanna Arch PT, DPT  04/08/2021, 12:18 PM  [place] [facility] [address] [address]Phone: [phone]  Fax:  [phone] Name: TKRISHNA DANCELMRN: [id number] of Birth: 7October 21, 1957
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
## 2021-04-13 ENCOUNTER — Ambulatory Visit: Payer: Medicare PPO

## 2021-04-13 ENCOUNTER — Ambulatory Visit: Payer: Medicare PPO | Admitting: Speech Pathology

## 2021-04-15 ENCOUNTER — Ambulatory Visit: Payer: Medicare PPO

## 2021-04-15 ENCOUNTER — Emergency Department: Payer: Medicare PPO

## 2021-04-15 ENCOUNTER — Ambulatory Visit: Payer: Medicare PPO | Admitting: Speech Pathology

## 2021-04-15 ENCOUNTER — Ambulatory Visit: Payer: Medicare PPO | Admitting: Physical Therapy

## 2021-04-15 ENCOUNTER — Emergency Department
Admission: EM | Admit: 2021-04-15 | Discharge: 2021-04-15 | Disposition: A | Discharge status: Home / Self Care | Payer: Medicare PPO | Attending: Emergency Medicine | Admitting: Emergency Medicine

## 2021-04-15 ENCOUNTER — Encounter: Payer: Self-pay | Admitting: Emergency Medicine

## 2021-04-15 ENCOUNTER — Other Ambulatory Visit: Payer: Self-pay

## 2021-04-15 VITALS — BP 177/90 | HR 68

## 2021-04-15 DIAGNOSIS — R41841 Cognitive communication deficit: Secondary | ICD-10-CM

## 2021-04-15 DIAGNOSIS — R519 Headache, unspecified: Secondary | ICD-10-CM | POA: Diagnosis not present

## 2021-04-15 DIAGNOSIS — R0789 Other chest pain: Secondary | ICD-10-CM

## 2021-04-15 DIAGNOSIS — G20 Parkinson's disease: Secondary | ICD-10-CM

## 2021-04-15 DIAGNOSIS — I10 Essential (primary) hypertension: Secondary | ICD-10-CM | POA: Insufficient documentation

## 2021-04-15 DIAGNOSIS — R2689 Other abnormalities of gait and mobility: Secondary | ICD-10-CM

## 2021-04-15 DIAGNOSIS — R471 Dysarthria and anarthria: Secondary | ICD-10-CM | POA: Insufficient documentation

## 2021-04-15 DIAGNOSIS — J45909 Unspecified asthma, uncomplicated: Secondary | ICD-10-CM | POA: Insufficient documentation

## 2021-04-15 DIAGNOSIS — M6281 Muscle weakness (generalized): Secondary | ICD-10-CM

## 2021-04-15 DIAGNOSIS — R278 Other lack of coordination: Secondary | ICD-10-CM

## 2021-04-15 DIAGNOSIS — R209 Unspecified disturbances of skin sensation: Secondary | ICD-10-CM | POA: Diagnosis not present

## 2021-04-15 DIAGNOSIS — R2681 Unsteadiness on feet: Secondary | ICD-10-CM

## 2021-04-15 DIAGNOSIS — I712 Thoracic aortic aneurysm, without rupture, unspecified: Secondary | ICD-10-CM

## 2021-04-15 LAB — BASIC METABOLIC PANEL
Anion gap: 8 (ref 5–15)
BUN: 19 mg/dL (ref 8–23)
CO2: 27 mmol/L (ref 22–32)
Calcium: 8.9 mg/dL (ref 8.9–10.3)
Chloride: 101 mmol/L (ref 98–111)
Creatinine, Ser: 1.18 mg/dL (ref 0.61–1.24)
GFR, Estimated: >60 mL/min (ref >60)
Glucose, Bld: 88 mg/dL (ref 70–99)
Potassium: 3.2 mmol/L — ABNORMAL LOW (ref 3.5–5.1)
Sodium: 136 mmol/L (ref 135–145)

## 2021-04-15 LAB — CBC
HCT: 42.5 % (ref 39.0–52.0)
Hemoglobin: 13.9 g/dL (ref 13.0–17.0)
MCH: 28.5 pg (ref 26.0–34.0)
MCHC: 32.7 g/dL (ref 30.0–36.0)
MCV: 87.3 fL (ref 80.0–100.0)
Platelets: 242 10*3/uL (ref 150–400)
RBC: 4.87 MIL/uL (ref 4.22–5.81)
RDW: 13.8 % (ref 11.5–15.5)
WBC: 15.6 10*3/uL — ABNORMAL HIGH (ref 4.0–10.5)
nRBC: 0 % (ref 0.0–0.2)

## 2021-04-15 LAB — TROPONIN I (HIGH SENSITIVITY)
Troponin I (High Sensitivity): 4 ng/L (ref <18)
Troponin I (High Sensitivity): 6 ng/L (ref <18)

## 2021-04-15 MED ORDER — IOHEXOL 350 MG/ML SOLN
100.0000 mL | Freq: Once | INTRAVENOUS | Status: AC | PRN
  Administered 2021-04-15: 100 mL via INTRAVENOUS

## 2021-04-15 NOTE — Therapy (Signed)
[place] MAIN [facility] SERVICES [address] [place], [place], [address] Phone: [phone]   Fax:  [phone]  Speech Language Pathology Treatment  Patient Details  Name: Cory [name] MRN: [id number] Date of Birth: Oct 27, 1955 Referring Provider (SLP): Ramonita Lab III, MD   Encounter Date: 04/15/2021   End of Session - 04/15/21 1236     Visit Number 7    Number of Visits 9    Date for SLP Re-Evaluation 05/05/21    Authorization Type [employer]    SLP Start Time 1005    SLP Stop Time  1100    SLP Time Calculation (min) 55 min    Activity Tolerance Patient tolerated treatment well             Past Medical History:  Diagnosis Date   Arthritis    hips and back   Asthma    as a baby-    GERD (gastroesophageal reflux disease)    rare -    Parkinson's disease ([place])    Pneumonia    age 66   Rash    legs- on going    Past Surgical History:  Procedure Laterality Date   APPENDECTOMY     BACK SURGERY     posterior lumbar spine fusion L4-5   COLONOSCOPY     COLONOSCOPY WITH PROPOFOL N/A 06/06/2017   Procedure: COLONOSCOPY WITH PROPOFOL;  Surgeon: Manya Silvas, MD;  Location: [facility] ENDOSCOPY;  Service: Endoscopy;  Laterality: N/A;   KNEE ARTHROSCOPY Bilateral    ACL   KNEE ARTHROSCOPY W/ ACL RECONSTRUCTION     RADIAL KERATOTOMY     ROTATOR CUFF REPAIR Bilateral    SHOULDER ARTHROSCOPY WITH BICEPS TENDON REPAIR Left 04/16/2019   Procedure: SHOULDER ARTHROSCOPY WITH BICEPS TENDON REPAIR, MINI OPEN SUPERIOR CAPSULAR RECONSTRUCTION, BICEPS TENODESIS;  Surgeon: Leim Fabry, MD;  Location: [facility] ORS;  Service: Orthopedics;  Laterality: Left;    There were no vitals filed for this visit.   Subjective Assessment - 04/15/21 1222     Subjective Arrives with dry erase schedule on clipboard    Patient is accompained by: --   [name]   Currently in Pain? No/denies                   ADULT SLP TREATMENT - 04/15/21 0001        General Information   Behavior/Cognition Alert;Cooperative      Treatment Provided   Treatment provided Cognitive-Linquistic      Cognitive-Linquistic Treatment   Treatment focused on Cognition;Patient/family/caregiver education    Skilled Treatment Patient and spouse report using the dry-erase schedule has been helpful; patient reports he is working on an excel document for his schedule that he would like to use, but expects this will take him several weeks. Continues to report missed medications at times. Patient reports he is interested in setting alerts on his phone, however declined to do this today when prompted.  Focused remainder of session on communicative effectiveness and education on ways pt can improve communication with spouse (louder speech, gaining spouse's attention first, reducing distractions, moving closer) and ways spouse can cue patient when a breakdown occurs.      Assessment / Recommendations / Plan   Plan Continue with current plan of care      Progression Toward Goals   Progression toward goals Progressing toward goals              SLP Education - 04/15/21 1236  Education Details strategies to improve communication effectiveness; how Parkinson's impacts speech    Person(s) Educated Patient;Spouse    Methods Explanation    Comprehension Verbalized understanding;Need further instruction                SLP Long Term Goals - 02/04/21 1220       SLP LONG TERM GOAL #1   Title Pt will use daily schedule to prepare in advance for scheduled activities/outings with min caregiver cues.    Time 4    Period Weeks    Status New    Target Date 05/05/21      SLP LONG TERM GOAL #2   Title Pt/spouse will report no missed medications over 1 week period using medication management system/strategies.    Time 4    Period Weeks    Status New    Target Date 05/05/21      SLP LONG TERM GOAL #3   Title Pt/spouse will report using strategies to maximize  communication effectiveness (reduce distractions, face-to-face conversation, chunking of information, repeat back information, etc).    Time 4    Period Weeks    Status New    Target Date 05/05/21              Plan - 04/15/21 1237     Clinical Impression Statement .Cory [name] presents with overall moderate cognitive communication deficits. Patient has had difficulty using his phone to manage schedule/med times- was agreeable to using a daily checklist which was provided for trial at home. Reports this trial has been working well so far, however pt remains determined to create an Excel schedule to use on his tablet/phone. Discussed plan of care; will take break to allow pt time to work on his version of his schedule, with check-in in 2-4 weeks as scheduling allows. Anticipate 1-2 additional sessions necessary to refine strategies for schedule maintenance at home. I recommend brief course of skilled ST to target use of strategies and compensations in specific functional tasks to maximize pt independence, improve safety, minimize frustration and reduce caregiver burden.    Speech Therapy Frequency 1x /week   adjust to 1x per week for 8 weeks due to scheduling   Duration 8 weeks    Treatment/Interventions Environmental controls;Cueing hierarchy;SLP instruction and feedback;Compensatory strategies;Functional tasks;Internal/external aids;Patient/family education    Potential to Achieve Goals Fair    Potential Considerations Ability to learn/carryover information;Severity of impairments;Previous level of function    SLP Home Exercise Plan to be provided next session    Consulted and Agree with Plan of Care Patient;Family member/caregiver    Family Member Consulted Wife             Patient will benefit from skilled therapeutic intervention in order to improve the following deficits and impairments:   Cognitive communication deficit  Dysarthria and anarthria  Parkinson's disease  [facility])    Problem List Patient Active Problem List   Diagnosis Date Noted   Focal seizure ([place]) 05/24/2020   Parkinson's disease ([place])    History of migraine headaches    Migraine syndrome    BPH (benign prostatic hyperplasia)    Depression    Degenerative spondylolisthesis 05/07/2016   Deneise Lever, [place], [place] Speech-Language Pathologist  Aliene Altes, [place] 04/15/2021, 12:39 PM  [place] [address] [place], [place], [address] Phone: [phone]   Fax:  [phone]   Name: Cory [name] MRN: [id number] Date of Birth: [birth year]

## 2021-04-15 NOTE — Discharge Instructions (Signed)
Your aorta, which is the large blood vessel in your chest.  You will need to schedule follow-up with a vascular surgeonYour CT scan today showed dilation of to monitor this, Dr. Bunnie Domino contact information was provided.  Her blood pressure today was persistently elevated and you should schedule follow-up with a cardiologist for further evaluation of this as well as the pain in your chest.  You should hold your midodrine if your blood pressure is greater than 120/80 at home.  You were seen in the emergency department today for constipation.  We recommend that you use one or more of the following over-the-counter medications in the order described:   1)  Colace (or Dulcolax) 100 mg:  This is a stool softener, and you may take it once or twice a day as needed. 2)  Senna tablets:  This is a bowel stimulant that will help "push" out your stool. It is the next step to add after you have tried a stool softener. 3)  Miralax (powder):  This medication works by drawing additional fluid into your intestines and helps to flush out your stool.  Mix the powder with water or juice according to label instructions.  It may help if the Colace and Senna are not sufficient, but you must be sure to use the recommended amount of water or juice when you mix up the powder. 4)  Look for magnesium citrate at the pharmacy (it is usually a small glass bottle).  Drink the bottle according to the label instructions.  Remember that narcotic pain medications are constipating, so avoid them or minimize their use.  Drink plenty of fluids.  Please return to the Emergency Department immediately if you develop new or worsening symptoms that concern you, such as (but not limited to) fever > 101 degrees, severe abdominal pain, or persistent vomiting.

## 2021-04-15 NOTE — Therapy (Signed)
[place] MAIN [facility] SERVICES [address] [place], [place], [address] Phone: [phone]   Fax:  [phone]  Physical Therapy Treatment  Patient Details  Name: Cory [name] MRN: [id number] Date of Birth: 03-05-1956 Referring Provider (PT): Benay Spice   Encounter Date: 04/15/2021   PT End of Session - 04/15/21 1300     Visit Number 66    Number of Visits 65    Date for PT Re-Evaluation 05/20/21    Authorization Type [employer] Medicare PPO; next session 1/10 PN 04/08/20    Authorization Time Period Cert 10/09/14-07/37/10    PT Start Time 1103    PT Stop Time 1150    PT Time Calculation (min) 47 min    Equipment Utilized During Treatment Gait belt    Activity Tolerance Patient tolerated treatment well    Behavior During Therapy WFL for tasks assessed/performed             Past Medical History:  Diagnosis Date   Arthritis    hips and back   Asthma    as a baby-    GERD (gastroesophageal reflux disease)    rare -    Parkinson's disease ([place])    Pneumonia    age 78   Rash    legs- on going    Past Surgical History:  Procedure Laterality Date   APPENDECTOMY     BACK SURGERY     posterior lumbar spine fusion L4-5   COLONOSCOPY     COLONOSCOPY WITH PROPOFOL N/A 06/06/2017   Procedure: COLONOSCOPY WITH PROPOFOL;  Surgeon: Manya Silvas, MD;  Location: [facility] ENDOSCOPY;  Service: Endoscopy;  Laterality: N/A;   KNEE ARTHROSCOPY Bilateral    ACL   KNEE ARTHROSCOPY W/ ACL RECONSTRUCTION     RADIAL KERATOTOMY     ROTATOR CUFF REPAIR Bilateral    SHOULDER ARTHROSCOPY WITH BICEPS TENDON REPAIR Left 04/16/2019   Procedure: SHOULDER ARTHROSCOPY WITH BICEPS TENDON REPAIR, MINI OPEN SUPERIOR CAPSULAR RECONSTRUCTION, BICEPS TENODESIS;  Surgeon: Leim Fabry, MD;  Location: [facility] ORS;  Service: Orthopedics;  Laterality: Left;    Vitals:   04/15/21 1114  BP: (!) 177/90  Pulse: 68     Subjective Assessment - 04/15/21 1110      Subjective Reports having a good appointment with Dr. Manuella Ghazi; Medications were adjusted with Memantine added to help with fogginess; Just started with good tolerance; Reports no new falls this week; He reports having some stumbles, hitting the wall;    Patient is accompained by: Family member    Pertinent History Patient admitted to hospital 05/23/20 with code stroke and R sided weakness. Imaging negative for acute changes. PMH includes Parkinson's with autonomic instability, asthma, GERD depression, degenerative spondylolisthesis, focal seizures and migraine syndrome. Patient had three ED visits for falls since then. Patient reports when he has the seizures he has pain in his temples and bright flashes in his vision. Last week had three falls in one day.    Limitations Lifting;Standing;Walking;House hold activities    How long can you sit comfortably? sleeps in easy chair.    How long can you stand comfortably? feel unsteady once in standing.    How long can you walk comfortably? difficult to walk during "off period"    Patient Stated Goals stability, have a device that will be most helpful, help to become and stay independent.    Currently in Pain? No/denies    Multiple Pain Sites No  TREATMENT: Vitals assessed- see above; BP elevated. Caregiver reports his BP has been all over the place. Patient denies any symptoms including no dizziness, headache. He reports feeling like usual.   Patient ambulated 150 feet x2 laps with U step walker, fast/slow walking with good ability to transition speed without freezing. Does require min VCs for avoiding shuffling feet. Also used visual cues on walker to help improve step length  Standing on airex pad: -feet together:  Eyes open 30 sec hold, eyes closed 30 sec hold  Eyes open, head turns side to side x3 reps with cues for visual targets to help reduce dizziness; progressed to head turns with eyes closed x3 reps with CGA to min A for safety  and increased unsteadiness noted;  Eyes open arms across chest, trunk rotation side/side x3 reps with visual targets for better stabilization, progressed to head turns side/side x3 reps;   BP after standing exercise: 188/95 After short seated rest break: BP 188/98   Stepping over cane x10 reps forward/backward, progressed to stepping over with cognitive challenge of counting backwards from 100 by 5's; Pt required CGA and cues to increase step length; He does exhibit shorter step length when stepping backwards  Side stepping over cane unsupported x10-15 reps with cognitive task of calling out boy/girl names Patient does have difficulty stepping while trying to think of names; He also required cues to increase step length for better foot clearance; Patient does not exhibit freezing but does exhibit shorter step length  BP following stepping task: 189/100  PT reached out and contacted MD to make him aware of patient's BP- While therapist was on hold with MD office, patient did report new onset of tingling on right side of head. He was assisted to mat table to lay down and rest Final BP was 189/95  MD office took a message and stated that they would follow up with patient after PCP was given notification.   Patient was assisted to transport chair and assisted to car.                              PT Short Term Goals - 10/21/20 0903       PT SHORT TERM GOAL #1   Title Patient will be independent in home exercise program to improve strength/mobility for better functional independence with ADLs.    Baseline 6/16: HEP to be given next session 7/19: HEP compliant    Time 4    Period Weeks    Status Achieved    Target Date 10/16/20               PT Long Term Goals - 04/08/21 1213       PT LONG TERM GOAL #1   Title Patient will increase FOTO score to equal to or greater than 51%    to demonstrate statistically significant improvement in mobility and quality  of life.    Baseline 6/16: 48% 7/19: 57% 8/31: 50% 11/23: 53%    Time 12    Period Weeks    Status Achieved      PT LONG TERM GOAL #2   Title Patient will increase Berg Balance score by > 6 points (34/56)  to demonstrate decreased fall risk during functional activities.    Baseline 6/16: 28/56 7/19: 45/56    Time 12    Period Weeks    Status Achieved      PT LONG TERM GOAL #  3   Title Patient will deny any falls over past 4 weeks to demonstrate improved safety awareness at home and work.    Baseline 6/16: multiple falls 7/19: no falls reported 9/7: 3 falls in past month; 2 falls in past month 11/23: 1 fall past month 1/4: at least 6    Time 12    Period Weeks    Status Partially Met    Target Date 05/20/21      PT LONG TERM GOAL #4   Title Patient will increase six minute walk test distance to >1300 for progression to age norm community ambulator and improve gait ability    Baseline 6/16: 945 ft with multiple near falls 7/19: 1495 ft    Time 12    Period Weeks    Status Achieved      PT LONG TERM GOAL #5   Title Patient will increase Berg Balance score by > 6 points (51/56)  to demonstrate decreased fall risk during functional activities.    Baseline 7/19: 45/56 8/31: 49/56; 10/14: 52/56 11/23: 52/56 1/4: 46/56    Time 12    Period Weeks    Status Partially Met    Target Date 05/20/21      PT LONG TERM GOAL #6   Title Patient will increase Functional Gait Assessment score to >20/30 as to reduce fall risk and improve dynamic gait safety with community ambulation.    Baseline 7/19: 16/30 8/31: 20/30;    Time 12    Period Weeks    Status Achieved      PT LONG TERM GOAL #7   Title Patient will increase Functional Gait Assessment score to >26/30 as to reduce fall risk and improve dynamic gait safety with community ambulation.    Baseline 8/31: 20/30; 10/14: 22/30 11/23: 24/30    Time 12    Period Weeks    Status Partially Met    Target Date 05/20/21      PT LONG TERM GOAL  #8   Title Patient will static stand for approximately 5 minutes and perturbations for shooting/teaching classes without loss of balance or requiring object to lean on.    Baseline Unable to perform without leaning on objects; 10/14: Still unable to perform without leaning on objects. 11/23: able to perform 5 minutes    Time 12    Period Weeks    Status Achieved    Target Date 05/20/21      PT LONG TERM GOAL  #9   TITLE Patient will be able to ambulate approximately 1 mile walking dog across changing surfaces such as grass, concrete, and sidewalk without loss of balance.    Baseline 9/7: unable to perform yet; 10/14: Unable 11/23: has not been able to attempt due to recent increase in "off times" 1/4 : unable    Time 12    Period Weeks    Status On-going    Target Date 05/20/21      PT LONG TERM GOAL  #10   TITLE Patient will bend down and pick up items without losing balance to perform iADLs such as emptying dishwasher and feeding dog.    Baseline 9/7: unable to perform; 10/14: Performing at home without falls or LOB.    Time 12    Period Weeks    Status Achieved      PT LONG TERM GOAL  #11   TITLE Patient will increase lower extremity functional scale to >60/80 to demonstrate improved functional mobility and increased tolerance  with ADLs.    Baseline 11/23: 44 1/4: 40    Time 12    Period Weeks    Status New    Target Date 05/20/21      PT LONG TERM GOAL  #12   TITLE Patient will static stand and reach into pocket to grab item without LOB for improved stability and mobility.    Baseline 11/23; LOB reaching into pocket; 1/4: able to perform with only one episode of rocking with increased time    Time 12    Period Weeks    Status Partially Met    Target Date 05/20/21                   Plan - 04/15/21 1300     Clinical Impression Statement Patient motivated and participated fair within session. BP elevated today. He denies any symptoms including no  headache/dizziness. Patient instructed in low impact activities to reduce strain on BP. Focused on balance exercise. Patient does have increased difficulty standing on compliant surface with eyes closed. He did require CGA to min A for safety; Vitals were monitored throughout session. BP remained elevated but stable averaging 188/90-95; Patient does take medication to elevate BP; He states he will hold off taking this medication. PT reached out to PCP to inform him of patient's vitals. During therapy session patient denies any symptoms. However during last 5 min of session he started to feel tingling on right side of head. He requested to lay down and was assisted to mat table. While laying down, BP was re-assessed and continued to be elevated at 189/95; Patient's PCP was given message and nursing stated they would contact patient to inform them of what the doctor would like for him to do. Patient was assisted to transport chair and taken to car. He would benefit from additional skilled PT Intervention to improve strength, balance and mobility;    Personal Factors and Comorbidities Age;Comorbidity 3+;Fitness;Past/Current Experience;Time since onset of injury/illness/exacerbation;Transportation    Comorbidities Parkinson's with autonomic instability, asthma, GERD depression, degenerative spondylolisthesis, focal seizures and migraine syndrome    Examination-Activity Limitations Bathing;Bed Mobility;Bend;Caring for [employer];Locomotion Level;Lift;Hygiene/Grooming;Squat;Stairs;Stand;Toileting;Transfers    Examination-Participation Restrictions Church;Cleaning;Community Activity;Driving;Meal Prep;Medication Management;Laundry;Shop;Volunteer;Yard Work    [employer]chant [employer] officer Evolving/Moderate complexity    Rehab Potential Fair    PT Frequency 2x / week    PT Duration 12 weeks    PT Treatment/Interventions ADLs/Self Care Home Management;Biofeedback;Canalith  Repostioning;Cryotherapy;Parrafin;Ultrasound;Traction;Moist Heat;Iontophoresis 88m/ml Dexamethasone;Electrical Stimulation;DME Instruction;Gait training;Stair training;Functional mobility training;Neuromuscular re-education;Balance training;Therapeutic exercise;Therapeutic activities;Patient/family education;Orthotic Fit/Training;Manual techniques;Passive range of motion;Manual lymph drainage;Energy conservation;Splinting;Taping;Vasopneumatic Device;Vestibular;Joint Manipulations;Visual/perceptual remediation/compensation    PT Next Visit Plan Increase cognitive demand/dual tasking during gait, progress thoracic opening and rotaiton exercises, core strengthening, progress dynamic balance and gait training for increased BOS and dual tasking. Add core strengthening to HEP. Add dynadisc tasks with backwards rotational component.    PT Home Exercise Plan No updates today    Consulted and Agree with Plan of Care Patient    Family Member Consulted wife             Patient will benefit from skilled therapeutic intervention in order to improve the following deficits and impairments:  Abnormal gait, Decreased activity tolerance, Decreased balance, Decreased knowledge of precautions, Decreased endurance, Decreased coordination, Decreased cognition, Decreased knowledge of use of DME, Decreased mobility, Decreased safety awareness, Difficulty walking, Decreased strength, Impaired flexibility, Impaired perceived functional ability, Impaired sensation, Impaired tone, Impaired UE functional use, Impaired vision/preception, Improper body mechanics, Postural dysfunction  Visit Diagnosis: Other abnormalities of gait and mobility  Unsteadiness on feet  Parkinson's disease ([place])  Other lack of coordination  Muscle weakness (generalized)     Problem List Patient Active Problem List   Diagnosis Date Noted   Focal seizure ([place]) 05/24/2020   Parkinson's disease ([place])    History of migraine headaches     Migraine syndrome    BPH (benign prostatic hyperplasia)    Depression    Degenerative spondylolisthesis 05/07/2016    Jaculin Rasmus, PT, DPT 04/15/2021, 2:56 PM  [place] MAIN [facility] SERVICES [address] [place], [place], [address] Phone: [phone]   Fax:  [phone]  Name: KELDRICK POMPLUN MRN: [id number] Date of Birth: 07-24-1955

## 2021-04-15 NOTE — ED Provider Notes (Signed)
[facility] Provider Note    Event Date/Time   First MD Initiated Contact with Patient 04/15/21 1544     (approximate)   History   Chief Complaint Chest Pain and Hypertension   HPI  Cory [name] is a 66 y.o. male with past med history of Parkinson disease, migraines, seizures, and asthma who presents to the ED for chest pain.  Patient reports that he was at physical therapy earlier today when he started to have a discomfort in the left side of his chest.  He describes this as a dull ache that does not radiate anywhere.  Pain has been present constantly since then, but is gradually improving in severity.  He denies any associated fevers, cough, shortness of breath, pain or swelling in his legs.  They noted at physical therapy that his blood pressure was elevated to around 180/90.  He states that typically his blood pressure runs low and he has to take midodrine to keep it up.  He took a dose of midodrine early this morning but has not had any since then.  He denies any significant cardiac history.  He additionally complains of headache and tingling in his brain, but states this is not unusual for him.     Physical Exam   Triage Vital Signs: ED Triage Vitals  Enc Vitals Group     BP 04/15/21 1304 (!) 202/96     Pulse Rate 04/15/21 1304 68     Resp 04/15/21 1304 20     Temp 04/15/21 1304 97.7 F (36.5 C)     Temp Source 04/15/21 1304 Oral     SpO2 04/15/21 1304 98 %     Weight 04/15/21 1305 150 lb (68 kg)     Height 04/15/21 1305 5\' 7"  (1.702 m)     Head Circumference --      Peak Flow --      Pain Score 04/15/21 1305 4     Pain Loc --      Pain Edu? --      Excl. in [place]? --     Most recent vital signs: Vitals:   04/15/21 2000 04/15/21 2015  BP: (!) 193/100 (!) 200/98  Pulse: 80 78  Resp: (!) 21 17  Temp:    SpO2: 95% 97%    Constitutional: Alert and oriented. Eyes: Conjunctivae are normal. Head: Atraumatic. Nose: No  congestion/rhinnorhea. Mouth/Throat: Mucous membranes are moist.  Cardiovascular: Normal rate, regular rhythm. Grossly normal heart sounds.  2+ radial and DP pulses bilaterally. Respiratory: Normal respiratory effort.  No retractions. Lungs CTAB.  No chest wall tenderness to palpation. Gastrointestinal: Soft and nontender. No distention. Genitourinary: Deferred. Musculoskeletal: No lower extremity tenderness nor edema.  Neurologic:  Normal speech and language. No gross focal neurologic deficits are appreciated.    ED Results / Procedures / Treatments   Labs (all labs ordered are listed, but only abnormal results are displayed) Labs Reviewed  BASIC METABOLIC PANEL - Abnormal; Notable for the following components:      Result Value   Potassium 3.2 (*)    All other components within normal limits  CBC - Abnormal; Notable for the following components:   WBC 15.6 (*)    All other components within normal limits  TROPONIN I (HIGH SENSITIVITY)  TROPONIN I (HIGH SENSITIVITY)     EKG  ED ECG REPORT I, Blake Divine, the attending physician, personally viewed and interpreted this ECG.   Date: 04/15/2021  EKG Time: 12:55  Rate: 66  Rhythm: normal sinus rhythm  Axis: Normal  Intervals:none  ST&T Change: None  RADIOLOGY Chest x-ray reviewed by me with no infiltrate, edema, or effusion.  PROCEDURES:  Critical Care performed: No  .1-3 Lead EKG Interpretation Performed by: Blake Divine, MD Authorized by: Blake Divine, MD     Interpretation: normal     ECG rate:  70-80   ECG rate assessment: normal     Rhythm: sinus rhythm     Ectopy: none     Conduction: normal     MEDICATIONS ORDERED IN ED: Medications  iohexol (OMNIPAQUE) 350 MG/ML injection 100 mL (100 mLs Intravenous Contrast Given 04/15/21 1946)     IMPRESSION / MDM / ASSESSMENT AND PLAN / ED COURSE  I reviewed the triage vital signs and the nursing notes.                              66 y.o. male  with past medical history of Parkinson disease, migraines, seizures, and asthma who presents to the ED for chest pressure starting earlier today at physical therapy, when his blood pressure was also noted to be elevated.  Differential diagnosis includes, but is not limited to, ACS, PE, dissection, pneumonia, pneumothorax, musculoskeletal, GERD.  Patient is well-appearing and in no acute distress, vital signs significant for hypertension but otherwise reassuring.  Patient complains of mild headache that is not unusual for him, has no focal neurologic deficits on exam and I doubt acute stroke.  CT head was performed and negative for acute process.  EKG shows no evidence of arrhythmia or ischemia and initial troponin is negative, low suspicion for ACS but we will trend troponin.  Chest x-ray reviewed by me with no infiltrate, edema, or effusion.  With chest discomfort and significant hypertension that is unusual for the patient, we will further assess with CTA to rule out dissection, pulses currently intact in all 4 extremities.  CBC and BMP are unremarkable, no anemia or electrolyte abnormality noted.  CTA shows aneurysm of ascending aorta with no evidence of dissection, also shows atherosclerotic ulceration of the infrarenal aorta.  These findings were discussed with Dr. Lucky Cowboy of vascular surgery, who states patient would be appropriate for outpatient follow-up in the clinic with monitoring of aneurysm.  There are additional findings of questionable diverticulitis along with possible early SBO, however very low suspicion for either of these diagnoses given patient has no abdominal pain or vomiting.  He does have constipation and was counseled on outpatient regimen for constipation.  He is appropriate for discharge home with vascular surgery and cardiology follow-up for hypertension and chest pain.  He was counseled to return to the ED for new worsening symptoms, patient agrees with plan.  The patient is on the  cardiac monitor to evaluate for evidence of arrhythmia and/or significant heart rate changes.       FINAL CLINICAL IMPRESSION(S) / ED DIAGNOSES   Final diagnoses:  Atypical chest pain  Hypertension, unspecified type  Thoracic aortic aneurysm without rupture, unspecified part     Rx / DC Orders   ED Discharge Orders     None        Note:  This document was prepared using Dragon voice recognition software and may include unintentional dictation errors.   Blake Divine, MD 04/15/21 2205

## 2021-04-15 NOTE — ED Triage Notes (Signed)
Pt to ED POV, he was at therapy PTA, for his balance but developed chest pain "rock in his chest", SHOB and nausea. He has Parkinsons and seizures. He had tingling all over his head before his seizures, today he starting to get tingling in the back of his head and right sided headache also.

## 2021-04-15 NOTE — ED Provider Triage Note (Signed)
Emergency Medicine Provider Triage Evaluation Note  Cory [name] , a 66 y.o. male  was evaluated in triage.  Pt complains of tingling in the head, elevated blood pressure, and chest pain while at physical therapy today..  Review of Systems  Positive: Tingling in the head, chest pain, weakness, elevated blood pressure Negative: Vomiting, diaphoresis  Physical Exam  BP (!) 202/96 (BP Location: Right Arm)    Pulse 68    Temp 97.7 F (36.5 C) (Oral)    Resp 20    Ht 5\' 7"  (1.702 m)    Wt 68 kg    SpO2 98%    BMI 23.49 kg/m  Gen:   Awake, no distress   Resp:  Normal effort  MSK:   Moves extremities without difficulty  Other:    Medical Decision Making  Medically screening exam initiated at 1:21 PM.  Appropriate orders placed.  Cory [name] was informed that the remainder of the evaluation will be completed by another provider, this initial triage assessment does not replace that evaluation, and the importance of remaining in the ED until their evaluation is complete.  Patient has history of Parkinson's   Versie Starks, PA-C 04/15/21 1321

## 2021-04-20 ENCOUNTER — Ambulatory Visit: Payer: Medicare PPO

## 2021-04-20 ENCOUNTER — Other Ambulatory Visit: Payer: Self-pay

## 2021-04-20 ENCOUNTER — Encounter: Payer: Medicare PPO | Admitting: Speech Pathology

## 2021-04-20 DIAGNOSIS — R2681 Unsteadiness on feet: Secondary | ICD-10-CM

## 2021-04-20 DIAGNOSIS — R2689 Other abnormalities of gait and mobility: Secondary | ICD-10-CM

## 2021-04-20 DIAGNOSIS — G20 Parkinson's disease: Secondary | ICD-10-CM

## 2021-04-20 NOTE — Therapy (Signed)
[place] MAIN [facility] SERVICES [address] [place], [place], [address] Phone: [phone]   Fax:  [phone]  Physical Therapy Treatment  Patient Details  Name: Cory [name] MRN: [id number] Date of Birth: 08-21-1955 Referring Provider (PT): Benay Spice   Encounter Date: 04/20/2021   PT End of Session - 04/20/21 1158     Visit Number 65    Number of Visits 42    Date for PT Re-Evaluation 05/20/21    Authorization Type [employer] PPO; 2/10 PN 04/08/20    Authorization Time Period Cert 06/10/83-88/67/50/27    PT Start Time 1102    PT Stop Time 1146    PT Time Calculation (min) 44 min    Equipment Utilized During Treatment Gait belt    Activity Tolerance Patient tolerated treatment well    Behavior During Therapy WFL for tasks assessed/performed             Past Medical History:  Diagnosis Date   Arthritis    hips and back   Asthma    as a baby-    GERD (gastroesophageal reflux disease)    rare -    Parkinson's disease ([place])    Pneumonia    age 66   Rash    legs- on going    Past Surgical History:  Procedure Laterality Date   APPENDECTOMY     BACK SURGERY     posterior lumbar spine fusion L4-5   COLONOSCOPY     COLONOSCOPY WITH PROPOFOL N/A 06/06/2017   Procedure: COLONOSCOPY WITH PROPOFOL;  Surgeon: Manya Silvas, MD;  Location: [facility] ENDOSCOPY;  Service: Endoscopy;  Laterality: N/A;   KNEE ARTHROSCOPY Bilateral    ACL   KNEE ARTHROSCOPY W/ ACL RECONSTRUCTION     RADIAL KERATOTOMY     ROTATOR CUFF REPAIR Bilateral    SHOULDER ARTHROSCOPY WITH BICEPS TENDON REPAIR Left 04/16/2019   Procedure: SHOULDER ARTHROSCOPY WITH BICEPS TENDON REPAIR, MINI OPEN SUPERIOR CAPSULAR RECONSTRUCTION, BICEPS TENODESIS;  Surgeon: Leim Fabry, MD;  Location: [facility] ORS;  Service: Orthopedics;  Laterality: Left;    There were no vitals filed for this visit.   Subjective Assessment - 04/20/21 1155     Subjective Patient went to  ED since last session. Chest Ct showed an aneurysm on aorta, sees vascular surgeon tomorrow. Is seeing cardiology soon as well.    Patient is accompained by: Family member    Pertinent History Patient admitted to hospital 05/23/20 with code stroke and R sided weakness. Imaging negative for acute changes. PMH includes Parkinson's with autonomic instability, asthma, GERD depression, degenerative spondylolisthesis, focal seizures and migraine syndrome. Patient had three ED visits for falls since then. Patient reports when he has the seizures he has pain in his temples and bright flashes in his vision. Last week had three falls in one day.    Limitations Lifting;Standing;Walking;House hold activities    How long can you sit comfortably? sleeps in easy chair.    How long can you stand comfortably? feel unsteady once in standing.    How long can you walk comfortably? difficult to walk during "off period"    Patient Stated Goals stability, have a device that will be most helpful, help to become and stay independent.    Currently in Pain? No/denies                  BP: 167/89  BP at end of session: 159/95   Patient went to ED since  last session. Chest Ct showed an aneurysm on aorta, sees vascular surgeon tomorrow. Is seeing cardiology soon as well.   Treatment: Airex pad reach for letters outside BOS for dual task adding cognitive aspect x 6 minutes BP after intervention: seated: 158/92 standing: 139/89   Three hedgehog taps with SUE support 10x each LE Two hedgehogs: one in front one in back for large step forward large step back, max cueing for tapping on top of hedgehog.   6" step up/down with SUE support 10x each LE  Seated: GTB hamstring curl 15x each LE 6" step toe taps 2x 30 seconds   Pt educated throughout session about proper posture and technique with exercises. Improved exercise technique, movement at target joints, use of target muscles after min to mod verbal, visual,  tactile cues.   Patient's blood pressure and vitals monitored throughout session to ensure remainder in therapeutic range. Patient educated on possible need of cardiac rehab pending cardiology recommendation. Patient focused on step length and spatial awareness throughout session. The pt will continue to benefit from skilled PT to improve coordnation, gait, and balance to increase ease and safety with ADLs.                   PT Education - 04/20/21 1155     Education Details exercise technique, body mechanics,    Person(s) Educated Patient    Methods Explanation;Demonstration;Tactile cues;Verbal cues    Comprehension Verbalized understanding;Returned demonstration;Verbal cues required;Tactile cues required              PT Short Term Goals - 10/21/20 0903       PT SHORT TERM GOAL #1   Title Patient will be independent in home exercise program to improve strength/mobility for better functional independence with ADLs.    Baseline 6/16: HEP to be given next session 7/19: HEP compliant    Time 4    Period Weeks    Status Achieved    Target Date 10/16/20               PT Long Term Goals - 04/08/21 1213       PT LONG TERM GOAL #1   Title Patient will increase FOTO score to equal to or greater than 51%    to demonstrate statistically significant improvement in mobility and quality of life.    Baseline 6/16: 48% 7/19: 57% 8/31: 50% 11/23: 53%    Time 12    Period Weeks    Status Achieved      PT LONG TERM GOAL #2   Title Patient will increase Berg Balance score by > 6 points (34/56)  to demonstrate decreased fall risk during functional activities.    Baseline 6/16: 28/56 7/19: 45/56    Time 12    Period Weeks    Status Achieved      PT LONG TERM GOAL #3   Title Patient will deny any falls over past 4 weeks to demonstrate improved safety awareness at home and work.    Baseline 6/16: multiple falls 7/19: no falls reported 9/7: 3 falls in past month; 2 falls  in past month 11/23: 1 fall past month 1/4: at least 6    Time 12    Period Weeks    Status Partially Met    Target Date 05/20/21      PT LONG TERM GOAL #4   Title Patient will increase six minute walk test distance to >1300 for progression to age norm community ambulator and  improve gait ability    Baseline 6/16: 945 ft with multiple near falls 7/19: 1495 ft    Time 12    Period Weeks    Status Achieved      PT LONG TERM GOAL #5   Title Patient will increase Berg Balance score by > 6 points (51/56)  to demonstrate decreased fall risk during functional activities.    Baseline 7/19: 45/56 8/31: 49/56; 10/14: 52/56 11/23: 52/56 1/4: 46/56    Time 12    Period Weeks    Status Partially Met    Target Date 05/20/21      PT LONG TERM GOAL #6   Title Patient will increase Functional Gait Assessment score to >20/30 as to reduce fall risk and improve dynamic gait safety with community ambulation.    Baseline 7/19: 16/30 8/31: 20/30;    Time 12    Period Weeks    Status Achieved      PT LONG TERM GOAL #7   Title Patient will increase Functional Gait Assessment score to >26/30 as to reduce fall risk and improve dynamic gait safety with community ambulation.    Baseline 8/31: 20/30; 10/14: 22/30 11/23: 24/30    Time 12    Period Weeks    Status Partially Met    Target Date 05/20/21      PT LONG TERM GOAL #8   Title Patient will static stand for approximately 5 minutes and perturbations for shooting/teaching classes without loss of balance or requiring object to lean on.    Baseline Unable to perform without leaning on objects; 10/14: Still unable to perform without leaning on objects. 11/23: able to perform 5 minutes    Time 12    Period Weeks    Status Achieved    Target Date 05/20/21      PT LONG TERM GOAL  #9   TITLE Patient will be able to ambulate approximately 1 mile walking dog across changing surfaces such as grass, concrete, and sidewalk without loss of balance.    Baseline  9/7: unable to perform yet; 10/14: Unable 11/23: has not been able to attempt due to recent increase in "off times" 1/4 : unable    Time 12    Period Weeks    Status On-going    Target Date 05/20/21      PT LONG TERM GOAL  #10   TITLE Patient will bend down and pick up items without losing balance to perform iADLs such as emptying dishwasher and feeding dog.    Baseline 9/7: unable to perform; 10/14: Performing at home without falls or LOB.    Time 12    Period Weeks    Status Achieved      PT LONG TERM GOAL  #11   TITLE Patient will increase lower extremity functional scale to >60/80 to demonstrate improved functional mobility and increased tolerance with ADLs.    Baseline 11/23: 44 1/4: 40    Time 12    Period Weeks    Status New    Target Date 05/20/21      PT LONG TERM GOAL  #12   TITLE Patient will static stand and reach into pocket to grab item without LOB for improved stability and mobility.    Baseline 11/23; LOB reaching into pocket; 1/4: able to perform with only one episode of rocking with increased time    Time 12    Period Weeks    Status Partially Met    Target Date  05/20/21                   Plan - 04/20/21 1159     Clinical Impression Statement Patient's blood pressure and vitals monitored throughout session to ensure remainder in therapeutic range. Patient educated on possible need of cardiac rehab pending cardiology recommendation. Patient focused on step length and spatial awareness throughout session. The pt will continue to benefit from skilled PT to improve coordnation, gait, and balance to increase ease and safety with ADLs.    Personal Factors and Comorbidities Age;Comorbidity 3+;Fitness;Past/Current Experience;Time since onset of injury/illness/exacerbation;Transportation    Comorbidities Parkinson's with autonomic instability, asthma, GERD depression, degenerative spondylolisthesis, focal seizures and migraine syndrome    Examination-Activity  Limitations Bathing;Bed Mobility;Bend;Caring for [employer];Locomotion Level;Lift;Hygiene/Grooming;Squat;Stairs;Stand;Toileting;Transfers    Examination-Participation Restrictions Church;Cleaning;Community Activity;Driving;Meal Prep;Medication Management;Laundry;Shop;Volunteer;Yard Work    [employer]chant [employer] officer Evolving/Moderate complexity    Rehab Potential Fair    PT Frequency 2x / week    PT Duration 12 weeks    PT Treatment/Interventions ADLs/Self Care Home Management;Biofeedback;Canalith Repostioning;Cryotherapy;Parrafin;Ultrasound;Traction;Moist Heat;Iontophoresis 2m/ml Dexamethasone;Electrical Stimulation;DME Instruction;Gait training;Stair training;Functional mobility training;Neuromuscular re-education;Balance training;Therapeutic exercise;Therapeutic activities;Patient/family education;Orthotic Fit/Training;Manual techniques;Passive range of motion;Manual lymph drainage;Energy conservation;Splinting;Taping;Vasopneumatic Device;Vestibular;Joint Manipulations;Visual/perceptual remediation/compensation    PT Next Visit Plan Increase cognitive demand/dual tasking during gait, progress thoracic opening and rotaiton exercises, core strengthening, progress dynamic balance and gait training for increased BOS and dual tasking. Add core strengthening to HEP. Add dynadisc tasks with backwards rotational component.    PT Home Exercise Plan No updates today    Consulted and Agree with Plan of Care Patient    Family Member Consulted wife             Patient will benefit from skilled therapeutic intervention in order to improve the following deficits and impairments:  Abnormal gait, Decreased activity tolerance, Decreased balance, Decreased knowledge of precautions, Decreased endurance, Decreased coordination, Decreased cognition, Decreased knowledge of use of DME, Decreased mobility, Decreased safety awareness, Difficulty walking, Decreased strength, Impaired  flexibility, Impaired perceived functional ability, Impaired sensation, Impaired tone, Impaired UE functional use, Impaired vision/preception, Improper body mechanics, Postural dysfunction  Visit Diagnosis: Parkinson's disease ([place]  Other abnormalities of gait and mobility  Unsteadiness on feet     Problem List Patient Active Problem List   Diagnosis Date Noted   Focal seizure ([place] 05/24/2020   [place]    History of migraine headaches    Migraine syndrome    BPH (benign prostatic hyperplasia)    Depression    Degenerative spondylolisthesis 05/07/2016   MJanna Arch PT, DPT  04/20/2021, 12:01 PM  [id number] [address]Phone: [phone]  Fax:  [phone] Name: Cory PREECEMRN: [id number] of Birth: [birth year]

## 2021-04-21 ENCOUNTER — Encounter (INDEPENDENT_AMBULATORY_CARE_PROVIDER_SITE_OTHER): Payer: Self-pay | Admitting: Vascular Surgery

## 2021-04-21 ENCOUNTER — Ambulatory Visit (INDEPENDENT_AMBULATORY_CARE_PROVIDER_SITE_OTHER): Payer: Medicare PPO | Admitting: Vascular Surgery

## 2021-04-21 VITALS — BP 168/74 | HR 72 | Resp 16 | Wt 159.4 lb

## 2021-04-21 DIAGNOSIS — I7121 Aneurysm of the ascending aorta, without rupture: Secondary | ICD-10-CM

## 2021-04-21 DIAGNOSIS — I719 Aortic aneurysm of unspecified site, without rupture: Secondary | ICD-10-CM | POA: Diagnosis not present

## 2021-04-21 DIAGNOSIS — G20 Parkinson's disease: Secondary | ICD-10-CM | POA: Diagnosis not present

## 2021-04-21 DIAGNOSIS — I712 Thoracic aortic aneurysm, without rupture, unspecified: Secondary | ICD-10-CM | POA: Insufficient documentation

## 2021-04-21 NOTE — Assessment & Plan Note (Signed)
Sounds fairly significant.  On medications.

## 2021-04-21 NOTE — Progress Notes (Signed)
Patient ID: Cory [name], male   DOB: October 20, 1955, 66 y.o.   MRN: [id number]  Chief Complaint  Patient presents with   New Patient (Initial Visit)    [facility] hospital consult     HPI [name] is a 66 y.o. male.  I am asked to see the patient by Dr. 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  He underwent a CT scan of the chest abdomen pelvis which I have independently reviewed.  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  There is no malperfusion apparent.  There is no current aneurysmal degeneration in this location.  He does have enlargement of his ascending thoracic aorta measuring about 4.2 to 4.3 cm in maximal diameter.  This was not the reason he presented to the hospital, but his blood pressure is still running a little high.  He is not currently having any terrible symptoms worrisome for the penetrating ulcer.     Past Medical History:  Diagnosis Date   Arthritis    hips and back   Asthma    as a baby-    GERD (gastroesophageal reflux disease)    rare -    Parkinson's disease ([place])    Pneumonia    age 66   Rash    legs- on going    Past Surgical History:  Procedure Laterality Date   APPENDECTOMY     BACK SURGERY     posterior lumbar spine fusion L4-5   COLONOSCOPY     COLONOSCOPY WITH PROPOFOL N/A 06/06/2017   Procedure: COLONOSCOPY WITH PROPOFOL;  Surgeon: Manya Silvas, MD;  Location: [facility] ENDOSCOPY;  Service: Endoscopy;  Laterality: N/A;   KNEE ARTHROSCOPY Bilateral    ACL   KNEE ARTHROSCOPY W/ ACL RECONSTRUCTION     RADIAL KERATOTOMY     ROTATOR CUFF REPAIR Bilateral    SHOULDER ARTHROSCOPY WITH BICEPS TENDON REPAIR Left 04/16/2019   Procedure: SHOULDER ARTHROSCOPY WITH BICEPS TENDON REPAIR, MINI OPEN SUPERIOR CAPSULAR RECONSTRUCTION, BICEPS TENODESIS;  Surgeon: Leim Fabry, MD;  Location: [facility] ORS;   Service: Orthopedics;  Laterality: Left;     Family History  Problem Relation Age of Onset   Hypertension Mother   No bleeding disorders, clotting disorders, or aneurysms   Social History   Tobacco Use   Smoking status: Former    Packs/day: 0.25    Years: 23.00    Pack years: 5.75    Types: Cigarettes, Cigars   Smokeless tobacco: Never   Tobacco comments:    smokes 1 cigar per day 04/30/2016  Substance Use Topics   Alcohol use: Yes    Alcohol/week: 1.0 standard drink    Types: 1 Shots of liquor per week   Drug use: No     No Known Allergies  Current Outpatient Medications  Medication Sig Dispense Refill   Amantadine HCl 100 MG tablet Take 100 mg by mouth 3 (three) times daily.     aspirin EC 81 MG tablet Take 81 mg by mouth daily. Swallow whole.     azelastine (ASTELIN) 0.1 % nasal spray Place 1-2 sprays into both nostrils as directed.     carbidopa-levodopa (SINEMET CR) 50-200 MG tablet Take 1 tablet by mouth at bedtime.     carbidopa-levodopa (SINEMET IR) 25-100 MG tablet Take 2 tablets by mouth 3 (three) times daily.      entacapone (COMTAN)  200 MG tablet Take 200 mg by mouth 3 (three) times daily.     fludrocortisone (FLORINEF) 0.1 MG tablet Take 100 mcg by mouth every evening.     Fluticasone-Umeclidin-Vilant (TRELEGY ELLIPTA) 100-62.5-25 MCG/INH AEPB Inhale 1 puff into the lungs daily.     memantine (NAMENDA) 5 MG tablet Take 5 mg by mouth 2 (two) times daily.     Multiple Vitamin (MULTIVITAMIN WITH MINERALS) TABS tablet Take 1 tablet by mouth daily.     MYRBETRIQ 50 MG TB24 tablet Take 50 mg by mouth at bedtime.     sertraline (ZOLOFT) 50 MG tablet Take 50 mg by mouth daily.     tamsulosin (FLOMAX) 0.4 MG CAPS capsule Take 1 capsule (0.4 mg total) by mouth daily after supper. 10 capsule 0   tiZANidine (ZANAFLEX) 4 MG tablet Take 4 mg by mouth daily as needed for muscle spasms.      cyanocobalamin 1000 MCG tablet Take 1 tablet (1,000 mcg total) by mouth daily.  (Patient not taking: Reported on 04/21/2021) 30 tablet 0   levETIRAcetam (KEPPRA) 500 MG tablet Take 1 tablet (500 mg total) by mouth 2 (two) times daily. (Patient not taking: Reported on 09/30/2020) 60 tablet 0   midodrine (PROAMATINE) 2.5 MG tablet Take 2.5-5 mg by mouth See admin instructions. Take 5mg  at breakfast (as needed), 2.5mg  at lunch (as needed) and take 2.5mg  at supper (as needed) for low blood pressure (Patient not taking: Reported on 09/30/2020)     No current facility-administered medications for this visit.      REVIEW OF SYSTEMS (Negative unless checked)  Constitutional: [] Weight loss  [] Fever  [] Chills Cardiac: [] Chest pain   [] Chest pressure   [] Palpitations   [] Shortness of breath when laying flat   [] Shortness of breath at rest   [] Shortness of breath with exertion. Vascular:  [] Pain in legs with walking   [] Pain in legs at rest   [] Pain in legs when laying flat   [] Claudication   [] Pain in feet when walking  [] Pain in feet at rest  [] Pain in feet when laying flat   [] History of DVT   [] Phlebitis   [] Swelling in legs   [x] Varicose veins   [] Non-healing ulcers Pulmonary:   [] Uses home oxygen   [] Productive cough   [] Hemoptysis   [] Wheeze  [] COPD   [] Asthma Neurologic:  [] Dizziness  [] Blackouts   [x] Seizures   [] History of stroke   [] History of TIA  [] Aphasia   [] Temporary blindness   [] Dysphagia   [] Weakness or numbness in arms   [] Weakness or numbness in legs Musculoskeletal:  [x] Arthritis   [] Joint swelling   [x] Joint pain   [] Low back pain Hematologic:  [] Easy bruising  [] Easy bleeding   [] Hypercoagulable state   [] Anemic  [] Hepatitis Gastrointestinal:  [] Blood in stool   [] Vomiting blood  [x] Gastroesophageal reflux/heartburn   [] Abdominal pain Genitourinary:  [] Chronic kidney disease   [] Difficult urination  [] Frequent urination  [] Burning with urination   [] Hematuria Skin:  [] Rashes   [] Ulcers   [] Wounds Psychological:  [] History of anxiety   []  History of major  depression.    Physical Exam BP (!) 168/74 (BP Location: Right Arm)    Pulse 72    Resp 16    Wt 159 lb 6.4 oz (72.3 kg)    BMI 24.97 kg/m  Gen:  WD/WN, NAD Head: [place]/AT, No temporalis wasting.  Ear/Nose/Throat: Hearing grossly intact, nares w/o erythema or drainage, oropharynx w/o Erythema/Exudate Eyes: Conjunctiva clear, sclera non-icteric  Neck: trachea  midline.  No JVD.  Pulmonary:  Good air movement, respirations not labored, no use of accessory muscles  Cardiac: RRR, no JVD Vascular:  Vessel Right Left  Radial Palpable Palpable                          PT Palpable Palpable  DP Palpable Palpable   Gastrointestinal:. No masses, surgical incisions, or scars. Musculoskeletal: M/S 5/5 throughout.  Extremities without ischemic changes.  No deformity or atrophy.  No significant lower extremity edema.  Scattered varicose veins bilaterally Neurologic: Sensation grossly intact in extremities.  Symmetrical.  Speech is fluent. Motor exam as listed above. Psychiatric: Judgment intact, Mood & affect appropriate for pt's clinical situation. Dermatologic: No rashes or ulcers noted.  No cellulitis or open wounds.    Radiology DG Chest 2 View  Result Date: 04/15/2021 CLINICAL DATA:  Chest pain beginning today. EXAM: CHEST - 2 VIEW COMPARISON:  08/07/2020 FINDINGS: Heart size is normal. Mediastinal shadows are normal. The lungs are clear. The vascularity is normal. No effusions. No significant bone finding. IMPRESSION: No active cardiopulmonary disease. Electronically Signed   By: Nelson Chimes M.D.   On: 04/15/2021 14:15   CT Head Wo Contrast  Result Date: 04/15/2021 CLINICAL DATA:  Pt complains of tingling in the head, elevated blood pressure, and chest pain while at physical therapy today.Numbness or tingling, paresthesia (Ped 0-17y) EXAM: CT HEAD WITHOUT CONTRAST TECHNIQUE: Contiguous axial images were obtained from the base of the skull through the vertex without intravenous contrast.  RADIATION DOSE REDUCTION: This exam was performed according to the departmental dose-optimization program which includes automated exposure control, adjustment of the mA and/or kV according to patient size and/or use of iterative reconstruction technique. COMPARISON:  None. FINDINGS: Brain: No acute intracranial hemorrhage. No focal mass lesion. No CT evidence of acute infarction. No midline shift or mass effect. No hydrocephalus. Basilar cisterns are patent. There are periventricular and subcortical white matter hypodensities. Generalized cortical atrophy. Vascular: No hyperdense vessel or unexpected calcification. Skull: Normal. Negative for fracture or focal lesion. Sinuses/Orbits: Paranasal sinuses and mastoid air cells are clear. Orbits are clear. Other: None. IMPRESSION: 1. No acute intracranial findings. 2. Periventricular white matter hypodensity consistent with small vessel ischemia Electronically Signed   By: Suzy Bouchard M.D.   On: 04/15/2021 16:05   CT Angio Chest/Abd/Pel for Dissection W and/or W/WO  Result Date: 04/15/2021 CLINICAL DATA:  Suspected acute aortic syndrome. Chest pain, shortness of breath and nausea. EXAM: CT ANGIOGRAPHY CHEST, ABDOMEN AND PELVIS TECHNIQUE: Non-contrast CT of the chest was initially obtained. Multidetector CT imaging through the chest, abdomen and pelvis was performed using the standard protocol during bolus administration of intravenous contrast. Multiplanar reconstructed images and MIPs were obtained and reviewed to evaluate the vascular anatomy. RADIATION DOSE REDUCTION: This exam was performed according to the departmental dose-optimization program which includes automated exposure control, adjustment of the mA and/or kV according to patient size and/or use of iterative reconstruction technique. CONTRAST:  157mL OMNIPAQUE IOHEXOL 350 MG/ML SOLN COMPARISON:  PA and lateral chest today, chest CT without contrast 05/21/2019, abdomen and pelvis CT no contrast  04/08/2018 FINDINGS: CTA CHEST FINDINGS Cardiovascular: The cardiac size is normal. There is small chronic anterior pericardial effusion. Patchy three-vessel calcific CAD. Aneurysmal aortic root measuring 4.3 cm with remainder of the thoracic aorta within normal caliber limits. There is mild aortic atherosclerosis with mixed plaque in the arch, slight tortuosity in the descending segment and normal great  vessel branching and opacification. Both subclavian arteries are clear. There is no arterial or venous distention and no central arterial embolus no aortic dissection. Mediastinum/Nodes: There is a mildly enlarged thyroid gland in general without a visible focal nodule. Shotty subcentimeter mediastinal lymph nodes are again noted in the subcarinal area and AP window but no pathologically enlarged intrathoracic or axillary lymph nodes are seen. There is no tracheal mass no esophageal thickening. Lungs/Pleura: No pleural effusion, thickening or pneumothorax. There is stable 5.4 mm right middle lobe noncalcified nodule anteriorly on series 5 axial 94, 4 mm stable lingular nodule on axial 84, both presumed benign. There is mild posterior atelectasis. Scattered areas of subpleural reticulation continue to be noted in the mid to lower lung fields as well as scarring and mild bronchiectasis in the medial right middle lobe. The central airways are clear. There are no new nodules. Early paraseptal emphysematous change both lung apices. No appreciable active infiltrates. Musculoskeletal: Degenerative disc disease, spondylosis and slight dextroscoliosis thoracic spine. No spinal compression fracture. There is no displaced rib or sternal fracture or focal thoracic bone lesion. Review of the MIP images confirms the above findings. CTA ABDOMEN AND PELVIS FINDINGS VASCULAR Aorta: Normal caliber, with moderate mixed plaques. No aneurysm or dissection. There is a penetrating atherosclerotic ulcer in the left anterolateral infrarenal  aorta. On series 4 axial image 128 the ulcer mouth measures 7.4 mm transverse and the ulcer measures up to 9.5 mm transverse and 3.3 mm AP, with a maximum aortic diameter at this level measuring 1.9 cm. On the sagittal reconstructions this ulcer measures 10.5 mm craniocaudal. This does not appear to have been present on the 04/08/2018 noncontrast exam. Celiac: There nonstenosing calcifications at the vessel origin, minimal scattered plaques in splenic artery. No stenosis, aneurysm or vasculitis. SMA: Widely patent, with nonstenosing ostial calcifications. No aneurysm, dissection or vasculitis. Renals: Both renal arteries demonstrate nonstenosing ostial calcific plaques. There is no flow-limiting stenosis, aneurysm, fibromuscular dysplasia or vasculitis findings. IMA: Patent without evidence of aneurysm, dissection, vasculitis or significant stenosis. Inflow: Patent without evidence of aneurysm, dissection, vasculitis or significant stenosis. There patchy nonstenosing calcifications in the common iliac and proximal internal iliac arteries. The external iliac arteries are clear. Veins: No obvious venous abnormality within the limitations of this arterial phase study. Review of the MIP images confirms the above findings. NON-VASCULAR Hepatobiliary: 19 cm in length mildly steatotic liver. There is ill-defined increased enhancement in the posterior liver dome in the right lobe measuring 2.1 x 1.9 x 1.5 cm. Remainder of the liver enhance normally. The gallbladder and bile ducts are unremarkable. Pancreas: Unremarkable. No pancreatic ductal dilatation or surrounding inflammatory changes. Spleen: Normal in size and enhancement. Adrenals/Urinary Tract: There is no adrenal or focal renal cortical mass. There is a 1 cm cyst in the inferior pole left kidney posterior aspect of 11.3 Hounsfield units, another measuring 9 mm and 16 Hounsfield units in the lateral left lower pole. There are bilateral extrarenal pelves. No urinary  stones or obstruction are observed. The bladder thickness is normal. Stomach/Bowel: There are mildly dilated small bowel segments in the left upper abdomen measuring up to 3.1 cm. There is fold thickening in the stomach and in some of the nondilated left abdominal small bowel. The remaining small bowel is normal caliber. An appendix is not seen in this patient. There is diffuse colonic diverticulosis with most advanced diverticulosis in the sigmoid segment. There is equivocal faint stranding along side proximal third of the sigmoid colon not seen  previously and could be an early acute diverticulitis or fat scarring from a previous episode. Moderate stool retention is also noted. Lymphatic: There are no enlarged lymph nodes. Reproductive: Prostate is unremarkable. Other: Small umbilical fat hernia. Trace ascites in the posterior deep pelvis. No free air, hemorrhage or abscess. There is increased haziness in the mesenteric root fat. Musculoskeletal: Degenerative and postsurgical changes lumbar spine are noted, with again seen L4-5 discectomy and posterior fusion hardware. Ankylosis both anterior SI joints. Mild-to-moderate hip DJD. Stable slight grade 1 retrolisthesis L2-3 likely discogenic. Review of the MIP images confirms the above findings. IMPRESSION: 1. 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 No other aortic aneurysm is seen. There is mild aortic atherosclerosis in the arch segment with mixed plaque, and moderate abdominal aortic mixed plaque. 2. 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 There is no dissection, and no further penetrating ulcers. 3. Equivocal stranding adjacent the proximal third of the sigmoid colon which could be early acute diverticulitis or fat scarring from a previous episode. This was not seen in 2020.  Constipation. 4. Imaging findings of gastroenteritis. A few left upper abdominal small bowel segments are dilated up to 3.1 cm but no transition point is seen. The remaining small bowel is normal caliber. This could be due to an ileus or a low-grade early partial SBO. 5. Ill-defined area of enhancement in the posterior liver dome measuring 2.1 x 1.9 x 1.5 cm, could represent a liver hemangioma, mass or regional perfusion differences. MRI without and with contrast recommended. Mild hepatic steatosis and enlargement. 6. Small left renal cysts. 7. Increased haziness in the mesenteric root fat without adenopathy, could be due to mesenteric panniculitis, mesenteric congestion or fibrosing mesenteritis, age indeterminate but not seen in 6711. 8. Small umbilical fat hernia. 9. Three-vessel patchy calcific CAD, small chronic anterior pericardial fluid. 10. Remaining findings discussed above. Electronically Signed   By: Telford Nab M.D.   On: 04/15/2021 20:44    Labs Recent Results (from the past 2160 hour(s))  Basic metabolic panel     Status: Abnormal   Collection Time: 04/15/21  1:08 PM  Result Value Ref Range   Sodium 136 135 - 145 mmol/L   Potassium 3.2 (L) 3.5 - 5.1 mmol/L   Chloride 101 98 - 111 mmol/L   CO2 27 22 - 32 mmol/L   Glucose, Bld 88 70 - 99 mg/dL    Comment: Glucose reference range applies only to samples taken after fasting for at least 8 hours.   BUN 19 8 - 23 mg/dL   Creatinine, Ser 1.18 0.61 - 1.24 mg/dL   Calcium 8.9 8.9 - 10.3 mg/dL   GFR, Estimated >60 >60 mL/min    Comment: (NOTE) Calculated using the CKD-EPI Creatinine Equation (2021)    Anion gap 8 5 - 15    Comment: Performed at [facility], [address] [place], [place] [address]  CBC     Status: Abnormal   Collection Time: 04/15/21  1:08 PM  Result Value Ref Range   WBC 15.6 (H) 4.0 - 10.5 K/uL   RBC 4.87 4.22 - 5.81 MIL/uL   Hemoglobin 13.9 13.0 - 17.0 g/dL   HCT 42.5 39.0 - 52.0 %   MCV 87.3 80.0  - 100.0 fL   MCH 28.5 26.0 - 34.0 pg  MCHC 32.7 30.0 - 36.0 g/dL   RDW 13.8 11.5 - 15.5 %   Platelets 242 150 - 400 K/uL   nRBC 0.0 0.0 - 0.2 %    Comment: Performed at [facility], [place], [place] [address]  Troponin I (High Sensitivity)     Status: None   Collection Time: 04/15/21  1:08 PM  Result Value Ref Range   Troponin I (High Sensitivity) 4 <18 ng/L    Comment: (NOTE) Elevated high sensitivity troponin I (hsTnI) values and significant  changes across serial measurements may suggest ACS but many other  chronic and acute conditions are known to elevate hsTnI results.  Refer to the "Links" section for chest pain algorithms and additional  guidance. Performed at [facility], [place], [place] [address]   Troponin I (High Sensitivity)     Status: None   Collection Time: 04/15/21  4:19 PM  Result Value Ref Range   Troponin I (High Sensitivity) 6 <18 ng/L    Comment: (NOTE) Elevated high sensitivity troponin I (hsTnI) values and significant  changes across serial measurements may suggest ACS but many other  chronic and acute conditions are known to elevate hsTnI results.  Refer to the "Links" section for chest pain algorithms and additional  guidance. Performed at [facility], [address] [place], [place] [address]     Assessment/Plan:  Penetrating atherosclerotic ulcer of aorta [facility]) I have independently reviewed his CT scan from his emergency room visit last week.  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  There is not significant aneurysmal degeneration.  There is no extravasation or extension of the dissection plane distally.  He is not having significant back or abdominal pain at current.  He has no signs of peripheral embolization.  At current, I would just try to keep his blood pressure under reasonable control.  We discussed that most of these  do not require specific intervention.  I do recommend surveillance of this and would plan an aortoiliac duplex in about 3 months for follow-up.  Continue aspirin therapy.  Parkinson's disease ([place]) Sounds fairly significant.  On medications.  Thoracic aortic aneurysm 4.3 cm in the ascending aorta on his CT scan.  This is not of immediate significance.  Blood pressure control important in reducing growth over time.  This can be checked every year or 2 with a CT scan.      Leotis Pain 04/21/2021, 3:21 PM   This note was created with Dragon medical transcription system.  Any errors from dictation are unintentional.

## 2021-04-21 NOTE — Assessment & Plan Note (Signed)
4.3 cm in the ascending aorta on his CT scan.  This is not of immediate significance.  Blood pressure control important in reducing growth over time.  This can be checked every year or 2 with a CT scan.

## 2021-04-21 NOTE — Assessment & Plan Note (Signed)
I have independently reviewed his CT scan from his emergency room visit last week.  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  There is not significant aneurysmal degeneration.  There is no extravasation or extension of the dissection plane distally.  He is not having significant back or abdominal pain at current.  He has no signs of peripheral embolization.  At current, I would just try to keep his blood pressure under reasonable control.  We discussed that most of these do not require specific intervention.  I do recommend surveillance of this and would plan an aortoiliac duplex in about 3 months for follow-up.  Continue aspirin therapy.

## 2021-04-22 ENCOUNTER — Ambulatory Visit: Payer: Medicare PPO

## 2021-04-22 ENCOUNTER — Encounter: Payer: Medicare PPO | Admitting: Speech Pathology

## 2021-04-22 ENCOUNTER — Encounter: Payer: Self-pay | Admitting: *Deleted

## 2021-04-27 ENCOUNTER — Encounter: Payer: Medicare PPO | Admitting: Speech Pathology

## 2021-04-27 ENCOUNTER — Inpatient Hospital Stay: Payer: Medicare PPO

## 2021-04-27 ENCOUNTER — Encounter: Payer: Self-pay | Admitting: Oncology

## 2021-04-27 ENCOUNTER — Ambulatory Visit: Payer: Medicare PPO

## 2021-04-27 ENCOUNTER — Inpatient Hospital Stay: Payer: Medicare PPO | Attending: Oncology | Admitting: Oncology

## 2021-04-27 ENCOUNTER — Other Ambulatory Visit: Payer: Self-pay

## 2021-04-27 VITALS — BP 140/87 | HR 72 | Temp 96.6°F | Resp 18 | Wt 160.8 lb

## 2021-04-27 DIAGNOSIS — Z803 Family history of malignant neoplasm of breast: Secondary | ICD-10-CM | POA: Insufficient documentation

## 2021-04-27 DIAGNOSIS — D72119 Hypereosinophilic syndrome (hes), unspecified: Secondary | ICD-10-CM | POA: Insufficient documentation

## 2021-04-27 DIAGNOSIS — D721 Eosinophilia, unspecified: Secondary | ICD-10-CM | POA: Insufficient documentation

## 2021-04-27 DIAGNOSIS — I951 Orthostatic hypotension: Secondary | ICD-10-CM | POA: Insufficient documentation

## 2021-04-27 DIAGNOSIS — Z79899 Other long term (current) drug therapy: Secondary | ICD-10-CM | POA: Diagnosis not present

## 2021-04-27 DIAGNOSIS — N1831 Chronic kidney disease, stage 3a: Secondary | ICD-10-CM | POA: Diagnosis not present

## 2021-04-27 DIAGNOSIS — R2689 Other abnormalities of gait and mobility: Secondary | ICD-10-CM | POA: Diagnosis not present

## 2021-04-27 DIAGNOSIS — G20 Parkinson's disease: Secondary | ICD-10-CM | POA: Insufficient documentation

## 2021-04-27 DIAGNOSIS — D7219 Other eosinophilia: Secondary | ICD-10-CM | POA: Insufficient documentation

## 2021-04-27 DIAGNOSIS — R2681 Unsteadiness on feet: Secondary | ICD-10-CM

## 2021-04-27 LAB — COMPREHENSIVE METABOLIC PANEL
ALT: <5 U/L (ref 0–44)
AST: 13 U/L — ABNORMAL LOW (ref 15–41)
Albumin: 4 g/dL (ref 3.5–5.0)
Alkaline Phosphatase: 107 U/L (ref 38–126)
Anion gap: 11 (ref 5–15)
BUN: 15 mg/dL (ref 8–23)
CO2: 30 mmol/L (ref 22–32)
Calcium: 8.8 mg/dL — ABNORMAL LOW (ref 8.9–10.3)
Chloride: 98 mmol/L (ref 98–111)
Creatinine, Ser: 1.25 mg/dL — ABNORMAL HIGH (ref 0.61–1.24)
GFR, Estimated: >60 mL/min (ref >60)
Glucose, Bld: 84 mg/dL (ref 70–99)
Potassium: 3.1 mmol/L — ABNORMAL LOW (ref 3.5–5.1)
Sodium: 139 mmol/L (ref 135–145)
Total Bilirubin: 0.5 mg/dL (ref 0.3–1.2)
Total Protein: 8.8 g/dL — ABNORMAL HIGH (ref 6.5–8.1)

## 2021-04-27 LAB — CBC WITH DIFFERENTIAL/PLATELET
Abs Immature Granulocytes: 0.03 10*3/uL (ref 0.00–0.07)
Basophils Absolute: 0.1 10*3/uL (ref 0.0–0.1)
Basophils Relative: 1 %
Eosinophils Absolute: 5.4 10*3/uL — ABNORMAL HIGH (ref 0.0–0.5)
Eosinophils Relative: 41 %
HCT: 44.2 % (ref 39.0–52.0)
Hemoglobin: 14.4 g/dL (ref 13.0–17.0)
Immature Granulocytes: 0 %
Lymphocytes Relative: 11 %
Lymphs Abs: 1.5 10*3/uL (ref 0.7–4.0)
MCH: 28.5 pg (ref 26.0–34.0)
MCHC: 32.6 g/dL (ref 30.0–36.0)
MCV: 87.5 fL (ref 80.0–100.0)
Monocytes Absolute: 0.8 10*3/uL (ref 0.1–1.0)
Monocytes Relative: 6 %
Neutro Abs: 5.4 10*3/uL (ref 1.7–7.7)
Neutrophils Relative %: 41 %
Platelets: 260 10*3/uL (ref 150–400)
RBC: 5.05 MIL/uL (ref 4.22–5.81)
RDW: 13.8 % (ref 11.5–15.5)
WBC: 13.3 10*3/uL — ABNORMAL HIGH (ref 4.0–10.5)
nRBC: 0 % (ref 0.0–0.2)

## 2021-04-27 LAB — VITAMIN B12: Vitamin B-12: 446 pg/mL (ref 180–914)

## 2021-04-27 LAB — LACTATE DEHYDROGENASE: LDH: 141 U/L (ref 98–192)

## 2021-04-27 LAB — TSH: TSH: 1.051 u[IU]/mL (ref 0.350–4.500)

## 2021-04-27 LAB — TROPONIN I (HIGH SENSITIVITY): Troponin I (High Sensitivity): 8 ng/L (ref <18)

## 2021-04-27 LAB — PATHOLOGIST SMEAR REVIEW

## 2021-04-27 NOTE — Progress Notes (Addendum)
Hematology/Oncology Consult note [phone] [phone]      Patient Care Team: Adin Hector, MD as PCP - General (Internal Medicine) Earlie Server, MD as Consulting Physician (Hematology and Oncology) Lucky Cowboy Erskine Squibb, MD as Consulting Physician (Vascular Surgery) Vladimir Crofts, MD as Consulting Physician (Neurology) Darl Pikes as Referring Physician (Physician Assistant)  REFERRING PROVIDER: Mortimer Fries, PA-C  CHIEF COMPLAINTS/REASON FOR VISIT:  Evaluation of eosinophilia  HISTORY OF PRESENTING ILLNESS:   Cory [name] is a  66 y.o.  male with PMH listed below was seen in consultation at the request of  Mortimer Fries, PA-C  for evaluation of eosinophilia  Patient has a history of chronic eosinophilia.  Previous lab results were reviewed. Eosinophilia dated back to January 2021, progressively getting worse. He also has chronic basophilia. Patient has a history of asthma for which he follows up with pulmonology.  He reports that symptoms are well controlled.  He rarely has to use inhalers.  Denies wheezing, shortness of breath. Patient has Parkinson's disease, on carbidopa/levodopa, amantadine, Comptan  patient has symptoms of dyskinesia, balancing, cognitive impairment.  He is on /memantine for cognitive impairment. He also has episodes of orthostatic hypotension.  Has been on midodrine 2.5 mg twice daily. 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 He reports appetite has been good.  Weight has been stable.  No fever or chills, night sweats, skin rash.  Patient smokes 1 to 2 cigarettes every 2 weeks  04/15/2021, CT angiogram chest abdomen pelvis 1. 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 No other aortic aneurysm is seen.There is mild aortic atherosclerosis in the arch segment with mixed plaque, and moderate abdominal aortic mixed plaque. 2. In the infrarenal aorta, a penetrating atherosclerotic  ulcer along the left anterolateral aortic wall has developed since the 2020 noncontrast CT, measuring 7.4 mm at the base, with the ulcer itself measuring 9.5 by 3.3 x 10.5 mm. There is no dissection, and no further penetrating ulcers. 3. Equivocal stranding adjacent the proximal third of the sigmoid colon which could be early acute diverticulitis or fat scarring from a previous episode 4. Imaging findings of gastroenteritis 5. Ill-defined area of enhancement in the posterior liver dome measuring 2.1 x 1.9 x 1.5 cm   05/24/2020 Echo LVEF 55-60%.    Review of Systems  Constitutional:  Negative for appetite change, chills, fatigue, fever and unexpected weight change.  HENT:   Negative for hearing loss and voice change.   Eyes:  Negative for eye problems and icterus.  Respiratory:  Negative for chest tightness, cough and shortness of breath.   Cardiovascular:  Negative for chest pain and leg swelling.  Gastrointestinal:  Negative for abdominal distention and abdominal pain.  Endocrine: Negative for hot flashes.  Genitourinary:  Negative for difficulty urinating, dysuria and frequency.   Musculoskeletal:  Positive for gait problem. Negative for arthralgias.  Skin:  Negative for itching and rash.  Neurological:  Positive for gait problem, light-headedness and seizures. Negative for numbness.  Hematological:  Negative for adenopathy. Does not bruise/bleed easily.  Psychiatric/Behavioral:  Negative for confusion.    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 [facility])    ED (erectile dysfunction)    Eosinophilia    GERD (gastroesophageal reflux disease)    rare -     History of BPH    History of genital warts    History of solitary pulmonary nodule    Left and right lung nodules on CT in ER 1/20; stable by CT 2/21   Hypertriglyceridemia    Microscopic hematuria    Other chronic pain    Parkinson's disease ([place])    Pneumonia    age 73   Post-traumatic osteoarthritis of left knee    Rash    legs- on going   Seizure-like activity ([place])    Umbilical hernia     SURGICAL HISTORY: Past Surgical History:  Procedure Laterality Date   APPENDECTOMY     BACK SURGERY     posterior lumbar spine fusion L4-5   COLONOSCOPY     COLONOSCOPY WITH PROPOFOL N/A 06/06/2017   Procedure: COLONOSCOPY WITH PROPOFOL;  Surgeon: Manya Silvas, MD;  Location: [facility] ENDOSCOPY;  Service: Endoscopy;  Laterality: N/A;   KNEE ARTHROSCOPY Bilateral    ACL   KNEE ARTHROSCOPY W/ ACL RECONSTRUCTION     RADIAL KERATOTOMY     ROTATOR CUFF REPAIR Bilateral    SHOULDER ARTHROSCOPY WITH BICEPS TENDON REPAIR Left 04/16/2019   Procedure: SHOULDER ARTHROSCOPY WITH BICEPS TENDON REPAIR, MINI OPEN SUPERIOR CAPSULAR RECONSTRUCTION, BICEPS TENODESIS;  Surgeon: Leim Fabry, MD;  Location: [facility] ORS;  Service: Orthopedics;  Laterality: Left;    SOCIAL HISTORY: Social History   Socioeconomic History   Marital status: Married    Spouse name: Not on file   Number of children: Not on file   Years of education: Not on file   Highest education level: Not on file  Occupational History   Not on file  Tobacco Use   Smoking status: Some Days    Packs/day: 0.25    Years: 23.00    Pack years: 5.75    Types: Cigarettes, Cigars   Smokeless tobacco: Never   Tobacco comments:    smokes 1 cigar per day 04/30/2016  Vaping Use   Vaping Use: Never used  Substance and Sexual Activity   Alcohol use: Not Currently    Alcohol/week: 1.0 standard drink    Types: 1 Shots of liquor per week    Comment: has not had in 2 years   Drug use: No   Sexual activity: Not on file  Other Topics Concern    Not on file  Social History Narrative   Not on file   Social Determinants of Health   Financial Resource Strain: Not on file  Food Insecurity: Not on file  Transportation Needs: Not on file  Physical Activity: Not on file  Stress: Not on file  Social Connections: Not on file  Intimate Partner Violence: Not on file    FAMILY HISTORY: Family History  Problem Relation Age of Onset   Hypertension Mother    Breast cancer Mother    Lung cancer Father    Breast cancer Sister     ALLERGIES:  has No Known Allergies.  MEDICATIONS:  Current Outpatient Medications  Medication Sig Dispense Refill   Amantadine HCl 100 MG tablet Take 100 mg by mouth 3 (three) times daily.     aspirin EC 81 MG tablet Take 81 mg by mouth daily. Swallow whole.  azelastine (ASTELIN) 0.1 % nasal spray Place 1-2 sprays into both nostrils as directed.     carbidopa-levodopa (SINEMET CR) 50-200 MG tablet Take 1 tablet by mouth at bedtime.     carbidopa-levodopa (SINEMET IR) 25-100 MG tablet Take 2 tablets by mouth 3 (three) times daily.      cyanocobalamin 1000 MCG tablet Take 1 tablet (1,000 mcg total) by mouth daily. 30 tablet 0   entacapone (COMTAN) 200 MG tablet Take 200 mg by mouth 3 (three) times daily.     Fluticasone-Umeclidin-Vilant (TRELEGY ELLIPTA) 100-62.5-25 MCG/INH AEPB Inhale 1 puff into the lungs daily as needed.     lamoTRIgine (LAMICTAL) 100 MG tablet Take 100 mg by mouth daily.     Multiple Vitamin (MULTIVITAMIN WITH MINERALS) TABS tablet Take 1 tablet by mouth daily.     MYRBETRIQ 50 MG TB24 tablet Take 50 mg by mouth at bedtime.     sertraline (ZOLOFT) 50 MG tablet Take 50 mg by mouth daily.     tamsulosin (FLOMAX) 0.4 MG CAPS capsule Take 1 capsule (0.4 mg total) by mouth daily after supper. 10 capsule 0   tiZANidine (ZANAFLEX) 4 MG tablet Take 4 mg by mouth daily as needed for muscle spasms.      fludrocortisone (FLORINEF) 0.1 MG tablet Take 100 mcg by mouth every evening. (Patient not  taking: Reported on 04/27/2021)     memantine (NAMENDA) 5 MG tablet Take 5 mg by mouth 2 (two) times daily. (Patient not taking: Reported on 04/27/2021)     midodrine (PROAMATINE) 2.5 MG tablet Take 2.5-5 mg by mouth See admin instructions. Take 5mg  at breakfast (as needed), 2.5mg  at lunch (as needed) and take 2.5mg  at supper (as needed) for low blood pressure (Patient not taking: Reported on 09/30/2020)     No current facility-administered medications for this visit.     PHYSICAL EXAMINATION: ECOG PERFORMANCE STATUS: 1 - Symptomatic but completely ambulatory Vitals:   04/27/21 0948  BP: 140/87  Pulse: 72  Resp: 18  Temp: (!) 96.6 F (35.9 C)   Filed Weights   04/27/21 0948  Weight: 160 lb 12.8 oz (72.9 kg)    Physical Exam Constitutional:      General: He is not in acute distress.    Comments: Ambulates with walker.   HENT:     Head: Normocephalic and atraumatic.  Eyes:     General: No scleral icterus. Cardiovascular:     Rate and Rhythm: Normal rate and regular rhythm.     Heart sounds: Normal heart sounds.  Pulmonary:     Effort: Pulmonary effort is normal. No respiratory distress.     Breath sounds: No wheezing.  Abdominal:     General: Bowel sounds are normal. There is no distension.     Palpations: Abdomen is soft.  Musculoskeletal:        General: No deformity. Normal range of motion.     Cervical back: Normal range of motion and neck supple.     Comments: cogwheel rigidity  Skin:    General: Skin is warm and dry.     Findings: No erythema or rash.  Neurological:     Mental Status: He is alert and oriented to person, place, and time.  Psychiatric:        Mood and Affect: Mood normal.    LABORATORY DATA:  I have reviewed the data as listed Lab Results  Component Value Date   WBC 13.3 (H) 04/27/2021   HGB 14.4 04/27/2021  HCT 44.2 04/27/2021   MCV 87.5 04/27/2021   PLT 260 04/27/2021   Recent Labs    05/23/20 1141 09/15/20 1615 04/15/21 1308  04/27/21 1037  NA 137 137 136 139  K 4.0 3.9 3.2* 3.1*  CL 101 103 101 98  CO2 27 26 27 30   GLUCOSE 95 126* 88 84  BUN 23 17 19 15   CREATININE 1.39* 1.25* 1.18 1.25*  CALCIUM 9.4 9.3 8.9 8.8*  GFRNONAA 56* >60 >60 >60  PROT 7.3 7.7  --  8.8*  ALBUMIN 3.8 4.2  --  4.0  AST 12* 16  --  13*  ALT <5 <5  --  <5  ALKPHOS 90 88  --  107  BILITOT 0.8 0.7  --  0.5   Iron/TIBC/Ferritin/ %Sat No results found for: IRON, TIBC, FERRITIN, IRONPCTSAT    RADIOGRAPHIC STUDIES: I have personally reviewed the radiological images as listed and agreed with the findings in the report. DG Chest 2 View  Result Date: 04/15/2021 CLINICAL DATA:  Chest pain beginning today. EXAM: CHEST - 2 VIEW COMPARISON:  08/07/2020 FINDINGS: Heart size is normal. Mediastinal shadows are normal. The lungs are clear. The vascularity is normal. No effusions. No significant bone finding. IMPRESSION: No active cardiopulmonary disease. Electronically Signed   By: Nelson Chimes M.D.   On: 04/15/2021 14:15   CT Head Wo Contrast  Result Date: 04/15/2021 CLINICAL DATA:  Pt complains of tingling in the head, elevated blood pressure, and chest pain while at physical therapy today.Numbness or tingling, paresthesia (Ped 0-17y) EXAM: CT HEAD WITHOUT CONTRAST TECHNIQUE: Contiguous axial images were obtained from the base of the skull through the vertex without intravenous contrast. RADIATION DOSE REDUCTION: This exam was performed according to the departmental dose-optimization program which includes automated exposure control, adjustment of the mA and/or kV according to patient size and/or use of iterative reconstruction technique. COMPARISON:  None. FINDINGS: Brain: No acute intracranial hemorrhage. No focal mass lesion. No CT evidence of acute infarction. No midline shift or mass effect. No hydrocephalus. Basilar cisterns are patent. There are periventricular and subcortical white matter hypodensities. Generalized cortical atrophy. Vascular:  No hyperdense vessel or unexpected calcification. Skull: Normal. Negative for fracture or focal lesion. Sinuses/Orbits: Paranasal sinuses and mastoid air cells are clear. Orbits are clear. Other: None. IMPRESSION: 1. No acute intracranial findings. 2. Periventricular white matter hypodensity consistent with small vessel ischemia Electronically Signed   By: Suzy Bouchard M.D.   On: 04/15/2021 16:05   CT Angio Chest/Abd/Pel for Dissection W and/or W/WO  Result Date: 04/15/2021 CLINICAL DATA:  Suspected acute aortic syndrome. Chest pain, shortness of breath and nausea. EXAM: CT ANGIOGRAPHY CHEST, ABDOMEN AND PELVIS TECHNIQUE: Non-contrast CT of the chest was initially obtained. Multidetector CT imaging through the chest, abdomen and pelvis was performed using the standard protocol during bolus administration of intravenous contrast. Multiplanar reconstructed images and MIPs were obtained and reviewed to evaluate the vascular anatomy. RADIATION DOSE REDUCTION: This exam was performed according to the departmental dose-optimization program which includes automated exposure control, adjustment of the mA and/or kV according to patient size and/or use of iterative reconstruction technique. CONTRAST:  188mL OMNIPAQUE IOHEXOL 350 MG/ML SOLN COMPARISON:  PA and lateral chest today, chest CT without contrast 05/21/2019, abdomen and pelvis CT no contrast 04/08/2018 FINDINGS: CTA CHEST FINDINGS Cardiovascular: The cardiac size is normal. There is small chronic anterior pericardial effusion. Patchy three-vessel calcific CAD. Aneurysmal aortic root measuring 4.3 cm with remainder of the thoracic aorta within normal  caliber limits. There is mild aortic atherosclerosis with mixed plaque in the arch, slight tortuosity in the descending segment and normal great vessel branching and opacification. Both subclavian arteries are clear. There is no arterial or venous distention and no central arterial embolus no aortic dissection.  Mediastinum/Nodes: There is a mildly enlarged thyroid gland in general without a visible focal nodule. Shotty subcentimeter mediastinal lymph nodes are again noted in the subcarinal area and AP window but no pathologically enlarged intrathoracic or axillary lymph nodes are seen. There is no tracheal mass no esophageal thickening. Lungs/Pleura: No pleural effusion, thickening or pneumothorax. There is stable 5.4 mm right middle lobe noncalcified nodule anteriorly on series 5 axial 94, 4 mm stable lingular nodule on axial 84, both presumed benign. There is mild posterior atelectasis. Scattered areas of subpleural reticulation continue to be noted in the mid to lower lung fields as well as scarring and mild bronchiectasis in the medial right middle lobe. The central airways are clear. There are no new nodules. Early paraseptal emphysematous change both lung apices. No appreciable active infiltrates. Musculoskeletal: Degenerative disc disease, spondylosis and slight dextroscoliosis thoracic spine. No spinal compression fracture. There is no displaced rib or sternal fracture or focal thoracic bone lesion. Review of the MIP images confirms the above findings. CTA ABDOMEN AND PELVIS FINDINGS VASCULAR Aorta: Normal caliber, with moderate mixed plaques. No aneurysm or dissection. There is a penetrating atherosclerotic ulcer in the left anterolateral infrarenal aorta. On series 4 axial image 128 the ulcer mouth measures 7.4 mm transverse and the ulcer measures up to 9.5 mm transverse and 3.3 mm AP, with a maximum aortic diameter at this level measuring 1.9 cm. On the sagittal reconstructions this ulcer measures 10.5 mm craniocaudal. This does not appear to have been present on the 04/08/2018 noncontrast exam. Celiac: There nonstenosing calcifications at the vessel origin, minimal scattered plaques in splenic artery. No stenosis, aneurysm or vasculitis. SMA: Widely patent, with nonstenosing ostial calcifications. No aneurysm,  dissection or vasculitis. Renals: Both renal arteries demonstrate nonstenosing ostial calcific plaques. There is no flow-limiting stenosis, aneurysm, fibromuscular dysplasia or vasculitis findings. IMA: Patent without evidence of aneurysm, dissection, vasculitis or significant stenosis. Inflow: Patent without evidence of aneurysm, dissection, vasculitis or significant stenosis. There patchy nonstenosing calcifications in the common iliac and proximal internal iliac arteries. The external iliac arteries are clear. Veins: No obvious venous abnormality within the limitations of this arterial phase study. Review of the MIP images confirms the above findings. NON-VASCULAR Hepatobiliary: 19 cm in length mildly steatotic liver. There is ill-defined increased enhancement in the posterior liver dome in the right lobe measuring 2.1 x 1.9 x 1.5 cm. Remainder of the liver enhance normally. The gallbladder and bile ducts are unremarkable. Pancreas: Unremarkable. No pancreatic ductal dilatation or surrounding inflammatory changes. Spleen: Normal in size and enhancement. Adrenals/Urinary Tract: There is no adrenal or focal renal cortical mass. There is a 1 cm cyst in the inferior pole left kidney posterior aspect of 11.3 Hounsfield units, another measuring 9 mm and 16 Hounsfield units in the lateral left lower pole. There are bilateral extrarenal pelves. No urinary stones or obstruction are observed. The bladder thickness is normal. Stomach/Bowel: There are mildly dilated small bowel segments in the left upper abdomen measuring up to 3.1 cm. There is fold thickening in the stomach and in some of the nondilated left abdominal small bowel. The remaining small bowel is normal caliber. An appendix is not seen in this patient. There is diffuse colonic diverticulosis with  most advanced diverticulosis in the sigmoid segment. There is equivocal faint stranding along side proximal third of the sigmoid colon not seen previously and could be  an early acute diverticulitis or fat scarring from a previous episode. Moderate stool retention is also noted. Lymphatic: There are no enlarged lymph nodes. Reproductive: Prostate is unremarkable. Other: Small umbilical fat hernia. Trace ascites in the posterior deep pelvis. No free air, hemorrhage or abscess. There is increased haziness in the mesenteric root fat. Musculoskeletal: Degenerative and postsurgical changes lumbar spine are noted, with again seen L4-5 discectomy and posterior fusion hardware. Ankylosis both anterior SI joints. Mild-to-moderate hip DJD. Stable slight grade 1 retrolisthesis L2-3 likely discogenic. Review of the MIP images confirms the above findings. IMPRESSION: 1. 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 No other aortic aneurysm is seen. There is mild aortic atherosclerosis in the arch segment with mixed plaque, and moderate abdominal aortic mixed plaque. 2. 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 There is no dissection, and no further penetrating ulcers. 3. Equivocal stranding adjacent the proximal third of the sigmoid colon which could be early acute diverticulitis or fat scarring from a previous episode. This was not seen in 2020. Constipation. 4. Imaging findings of gastroenteritis. A few left upper abdominal small bowel segments are dilated up to 3.1 cm but no transition point is seen. The remaining small bowel is normal caliber. This could be due to an ileus or a low-grade early partial SBO. 5. Ill-defined area of enhancement in the posterior liver dome measuring 2.1 x 1.9 x 1.5 cm, could represent a liver hemangioma, mass or regional perfusion differences. MRI without and with contrast recommended. Mild hepatic steatosis and enlargement. 6. Small left renal cysts. 7. Increased haziness in the mesenteric  root fat without adenopathy, could be due to mesenteric panniculitis, mesenteric congestion or fibrosing mesenteritis, age indeterminate but not seen in 1610. 8. Small umbilical fat hernia. 9. Three-vessel patchy calcific CAD, small chronic anterior pericardial fluid. 10. Remaining findings discussed above. Electronically Signed   By: Telford Nab M.D.   On: 04/15/2021 20:44      ASSESSMENT & PLAN:  1. Other eosinophilia   2. Stage 3a chronic kidney disease (HCC)    #Chronic eosinophilia, progressively worsening Etiology unknown.  Could be secondary to acute/chronic inflammation, smoking, drug-induced, or bone marrow malignancies. Check CBC, CMP, TSH, flow cytometry, LDH, BCR ABL 1 FISH, vitamin B12, tryptase, troponin.  Pathology smear. Today's CBC showed absolute eosinophilia is above 1500, will proceed with additional work-up with HES  Check MPN/HES panel, JAK2 mutations, immunoglobulin levels.   CKD, stage III, increased total protein. Check SPEP, light chain ratio.  Follow up in 4 weeks to review results.  Orders Placed This Encounter  Procedures   CBC with Differential/Platelet    Standing Status:   Future    Number of Occurrences:   1    Standing Expiration Date:   04/27/2022   Comprehensive metabolic panel    Standing Status:   Future    Number of Occurrences:   1    Standing Expiration Date:   04/27/2022   TSH    Standing Status:   Future    Number of Occurrences:   1    Standing Expiration Date:   04/27/2022   Flow cytometry panel-leukemia/lymphoma work-up    Standing  Status:   Future    Number of Occurrences:   1    Standing Expiration Date:   04/27/2022   Lactate dehydrogenase    Standing Status:   Future    Number of Occurrences:   1    Standing Expiration Date:   04/27/2022   BCR-ABL1 FISH    Standing Status:   Future    Number of Occurrences:   1    Standing Expiration Date:   04/27/2022   Vitamin B12    Standing Status:   Future    Number of Occurrences:   1     Standing Expiration Date:   04/27/2022   Tryptase    Standing Status:   Future    Number of Occurrences:   1    Standing Expiration Date:   10/25/2021   Pathologist smear review    Standing Status:   Future    Number of Occurrences:   1    Standing Expiration Date:   04/27/2022    All questions were answered. The patient knows to call the clinic with any problems questions or concerns.  cc Mortimer Fries, PA-C   Thank you for this kind referral and the opportunity to participate in the care of this patient. A copy of today's note is routed to referring provider   Earlie Server, MD, PhD [facility] Hematology Oncology 04/27/2021

## 2021-04-27 NOTE — Progress Notes (Signed)
Pt here to establish care.

## 2021-04-27 NOTE — Therapy (Signed)
[place] MAIN [facility] SERVICES [address] [place], [place], [address] Phone: [phone]   Fax:  [phone]  Physical Therapy Treatment  Patient Details  Name: Cory [name] MRN: [id number] Date of Birth: 27-Apr-1955 Referring Provider (PT): Benay Spice   Encounter Date: 04/27/2021   PT End of Session - 04/27/21 1204     Visit Number 16    Number of Visits 94    Date for PT Re-Evaluation 05/20/21    Authorization Type [employer] PPO; 3/10 PN 04/08/20    Authorization Time Period Cert 05/12/04-23/76/28    PT Start Time 1100    PT Stop Time 1146    PT Time Calculation (min) 46 min    Equipment Utilized During Treatment Gait belt    Activity Tolerance Patient tolerated treatment well    Behavior During Therapy WFL for tasks assessed/performed             Past Medical History:  Diagnosis Date   Abnormal ankle brachial index    Aortic atherosclerosis ([place])    Arthritis    hips and back   Asthma    as a baby-    Bronchiectasis without complication ([place])    CKD (chronic kidney disease) stage 9, GFR 30-59 ml/min (HCC)    Coronary artery calcification seen on CT scan    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)    Pneumonia    age 8   Post-traumatic osteoarthritis of left knee    Rash    legs- on going   Seizure-like activity ([place])    Umbilical hernia     Past Surgical History:  Procedure Laterality Date   APPENDECTOMY     BACK SURGERY     posterior lumbar spine fusion L4-5   COLONOSCOPY     COLONOSCOPY WITH PROPOFOL N/A 06/06/2017   Procedure: COLONOSCOPY WITH  PROPOFOL;  Surgeon: Manya Silvas, MD;  Location: [facility] ENDOSCOPY;  Service: Endoscopy;  Laterality: N/A;   KNEE ARTHROSCOPY Bilateral    ACL   KNEE ARTHROSCOPY W/ ACL RECONSTRUCTION     RADIAL KERATOTOMY     ROTATOR CUFF REPAIR Bilateral    SHOULDER ARTHROSCOPY WITH BICEPS TENDON REPAIR Left 04/16/2019   Procedure: SHOULDER ARTHROSCOPY WITH BICEPS TENDON REPAIR, MINI OPEN SUPERIOR CAPSULAR RECONSTRUCTION, BICEPS TENODESIS;  Surgeon: Leim Fabry, MD;  Location: [facility] ORS;  Service: Orthopedics;  Laterality: Left;    There were no vitals filed for this visit.   Subjective Assessment - 04/27/21 1202     Subjective Patient reports he had a fall this morning and hit his head; took his BP after and noticed a large drop. Has seen the doctor since last seen by PT. Has been monitoring  BP at home.    Patient is accompained by: Family member    Pertinent History Patient admitted to hospital 05/23/20 with code stroke and R sided weakness. Imaging negative for acute changes. PMH includes Parkinson's with autonomic instability, asthma, GERD depression, degenerative spondylolisthesis, focal seizures and migraine syndrome.  Patient had three ED visits for falls since then. Patient reports when he has the seizures he has pain in his temples and bright flashes in his vision. Last week had three falls in one day.    Limitations Lifting;Standing;Walking;House hold activities    How long can you sit comfortably? sleeps in easy chair.    How long can you stand comfortably? feel unsteady once in standing.    How long can you walk comfortably? difficult to walk during "off period"    Patient Stated Goals stability, have a device that will be most helpful, help to become and stay independent.    Currently in Pain? No/denies               Patient reports he had a fall this morning and hit his head. Has seen the doctor since last seen by PT.     Take BP BP: 177/92 seated  171/85 standing  BP at end of  session 176/96   Treatment: Airex pad reach for letters outside BOS for dual task adding cognitive aspect x 6 minutes BP after intervention: seated: 177/98   Ambulating 400 ft without AD, good pivot x 2 trials ; close CGA   5 cones: -chevrone patterning 4x; very challenging with backwards walking , multiple shuffle steps -lateral step and two taps each LE,   Two hedgehogs: one in front one in back for large step forward large step back, max cueing for tapping on top of hedgehog.    Seated: Sit to stand throw rainbow ball 10x  On dynadisc:  -static positioning 2x 30 seconds -posterior anterior tilt for return to Com 10x -stabilization against pertubation's x 30 seconds -march with cue for height of march 10x each LE    Pt educated throughout session about proper posture and technique with exercises. Improved exercise technique, movement at target joints, use of target muscles after min to mod verbal, visual, tactile cues.     Patient tolerates progressive stabilization and dynamic mobility interventions well this session. Blood pressure monitored throughout session to ensure therapeutic range. He is highly motivated throughout session and has significant gains seen compared to previous sessions. The pt will continue to benefit from skilled PT to improve coordnation, gait, and balance to increase ease and safety with ADLs.                  PT Education - 04/27/21 1204     Education Details exercise technique, body mechanics    Person(s) Educated Patient    Methods Explanation;Demonstration;Tactile cues;Verbal cues    Comprehension Verbalized understanding;Returned demonstration;Verbal cues required;Tactile cues required              PT Short Term Goals - 10/21/20 0903       PT SHORT TERM GOAL #1   Title Patient will be independent in home exercise program to improve strength/mobility for better functional independence with ADLs.    Baseline 6/16: HEP to be  given next session 7/19: HEP compliant    Time 4    Period Weeks    Status Achieved    Target Date 10/16/20               PT Long Term Goals - 04/08/21 1213       PT LONG TERM GOAL #1   Title Patient will increase FOTO score to equal to or greater than 51%    to demonstrate statistically significant improvement in mobility and quality of life.  Baseline 6/16: 48% 7/19: 57% 8/31: 50% 11/23: 53%    Time 12    Period Weeks    Status Achieved      PT LONG TERM GOAL #2   Title Patient will increase Berg Balance score by > 6 points (34/56)  to demonstrate decreased fall risk during functional activities.    Baseline 6/16: 28/56 7/19: 45/56    Time 12    Period Weeks    Status Achieved      PT LONG TERM GOAL #3   Title Patient will deny any falls over past 4 weeks to demonstrate improved safety awareness at home and work.    Baseline 6/16: multiple falls 7/19: no falls reported 9/7: 3 falls in past month; 2 falls in past month 11/23: 1 fall past month 1/4: at least 6    Time 12    Period Weeks    Status Partially Met    Target Date 05/20/21      PT LONG TERM GOAL #4   Title Patient will increase six minute walk test distance to >1300 for progression to age norm community ambulator and improve gait ability    Baseline 6/16: 945 ft with multiple near falls 7/19: 1495 ft    Time 12    Period Weeks    Status Achieved      PT LONG TERM GOAL #5   Title Patient will increase Berg Balance score by > 6 points (51/56)  to demonstrate decreased fall risk during functional activities.    Baseline 7/19: 45/56 8/31: 49/56; 10/14: 52/56 11/23: 52/56 1/4: 46/56    Time 12    Period Weeks    Status Partially Met    Target Date 05/20/21      PT LONG TERM GOAL #6   Title Patient will increase Functional Gait Assessment score to >20/30 as to reduce fall risk and improve dynamic gait safety with community ambulation.    Baseline 7/19: 16/30 8/31: 20/30;    Time 12    Period Weeks     Status Achieved      PT LONG TERM GOAL #7   Title Patient will increase Functional Gait Assessment score to >26/30 as to reduce fall risk and improve dynamic gait safety with community ambulation.    Baseline 8/31: 20/30; 10/14: 22/30 11/23: 24/30    Time 12    Period Weeks    Status Partially Met    Target Date 05/20/21      PT LONG TERM GOAL #8   Title Patient will static stand for approximately 5 minutes and perturbations for shooting/teaching classes without loss of balance or requiring object to lean on.    Baseline Unable to perform without leaning on objects; 10/14: Still unable to perform without leaning on objects. 11/23: able to perform 5 minutes    Time 12    Period Weeks    Status Achieved    Target Date 05/20/21      PT LONG TERM GOAL  #9   TITLE Patient will be able to ambulate approximately 1 mile walking dog across changing surfaces such as grass, concrete, and sidewalk without loss of balance.    Baseline 9/7: unable to perform yet; 10/14: Unable 11/23: has not been able to attempt due to recent increase in "off times" 1/4 : unable    Time 12    Period Weeks    Status On-going    Target Date 05/20/21      PT LONG TERM  GOAL  #10   TITLE Patient will bend down and pick up items without losing balance to perform iADLs such as emptying dishwasher and feeding dog.    Baseline 9/7: unable to perform; 10/14: Performing at home without falls or LOB.    Time 12    Period Weeks    Status Achieved      PT LONG TERM GOAL  #11   TITLE Patient will increase lower extremity functional scale to >60/80 to demonstrate improved functional mobility and increased tolerance with ADLs.    Baseline 11/23: 44 1/4: 40    Time 12    Period Weeks    Status New    Target Date 05/20/21      PT LONG TERM GOAL  #12   TITLE Patient will static stand and reach into pocket to grab item without LOB for improved stability and mobility.    Baseline 11/23; LOB reaching into pocket; 1/4: able to  perform with only one episode of rocking with increased time    Time 12    Period Weeks    Status Partially Met    Target Date 05/20/21                   Plan - 04/27/21 1207     Clinical Impression Statement Patient tolerates progressive stabilization and dynamic mobility interventions well this session. Blood pressure monitored throughout session to ensure therapeutic range. He is highly motivated throughout session and has significant gains seen compared to previous sessions. The pt will continue to benefit from skilled PT to improve coordnation, gait, and balance to increase ease and safety with ADLs.    Personal Factors and Comorbidities Age;Comorbidity 3+;Fitness;Past/Current Experience;Time since onset of injury/illness/exacerbation;Transportation    Comorbidities Parkinson's with autonomic instability, asthma, GERD depression, degenerative spondylolisthesis, focal seizures and migraine syndrome    Examination-Activity Limitations Bathing;Bed Mobility;Bend;Caring for [employer];Locomotion Level;Lift;Hygiene/Grooming;Squat;Stairs;Stand;Toileting;Transfers    Examination-Participation Restrictions Church;Cleaning;Community Activity;Driving;Meal Prep;Medication Management;Laundry;Shop;Volunteer;Yard Work    [employer]chant [employer] officer Evolving/Moderate complexity    Rehab Potential Fair    PT Frequency 2x / week    PT Duration 12 weeks    PT Treatment/Interventions ADLs/Self Care Home Management;Biofeedback;Canalith Repostioning;Cryotherapy;Parrafin;Ultrasound;Traction;Moist Heat;Iontophoresis 42m/ml Dexamethasone;Electrical Stimulation;DME Instruction;Gait training;Stair training;Functional mobility training;Neuromuscular re-education;Balance training;Therapeutic exercise;Therapeutic activities;Patient/family education;Orthotic Fit/Training;Manual techniques;Passive range of motion;Manual lymph drainage;Energy  conservation;Splinting;Taping;Vasopneumatic Device;Vestibular;Joint Manipulations;Visual/perceptual remediation/compensation    PT Next Visit Plan Increase cognitive demand/dual tasking during gait, progress thoracic opening and rotaiton exercises, core strengthening, progress dynamic balance and gait training for increased BOS and dual tasking. Add core strengthening to HEP. Add dynadisc tasks with backwards rotational component.    PT Home Exercise Plan No updates today    Consulted and Agree with Plan of Care Patient    Family Member Consulted wife             Patient will benefit from skilled therapeutic intervention in order to improve the following deficits and impairments:  Abnormal gait, Decreased activity tolerance, Decreased balance, Decreased knowledge of precautions, Decreased endurance, Decreased coordination, Decreased cognition, Decreased knowledge of use of DME, Decreased mobility, Decreased safety awareness, Difficulty walking, Decreased strength, Impaired flexibility, Impaired perceived functional ability, Impaired sensation, Impaired tone, Impaired UE functional use, Impaired vision/preception, Improper body mechanics, Postural dysfunction  Visit Diagnosis: Parkinson's disease ([place]  Other abnormalities of gait and mobility  Unsteadiness on feet     Problem List Patient Active Problem List   Diagnosis Date Noted   Eosinophilia 04/27/2021   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[place]  04/21/2021   Thoracic aortic aneurysm 04/21/2021   Focal seizure ([place]) 05/24/2020   Parkinson's disease ([place])    History of migraine headaches    Migraine syndrome    BPH (benign prostatic hyperplasia)    Depression    Degenerative spondylolisthesis 05/07/2016    Janna Arch, PT, DPT  04/27/2021, 12:09 PM  [place] MAIN [facility] SERVICES [address] [place], [place], [address] Phone: [phone]   Fax:  [phone]  Name:  Cory [name] MRN: [id number] Date of Birth: [birth year]

## 2021-04-27 NOTE — Addendum Note (Signed)
Addended by: Earlie Server on: 04/27/2021 08:41 PM   Modules accepted: Orders

## 2021-04-28 ENCOUNTER — Telehealth: Payer: Self-pay | Admitting: Oncology

## 2021-04-28 ENCOUNTER — Telehealth: Payer: Self-pay

## 2021-04-28 LAB — TRYPTASE: Tryptase: 11.5 ug/L (ref 2.2–13.2)

## 2021-04-28 NOTE — Telephone Encounter (Signed)
-----   Message from Earlie Server, MD sent at 04/27/2021  8:39 PM EST ----- Let him know that some of his results are back, after reviewing, I recommend additional testing. Please arrange. Labs are ordered. Thanks.

## 2021-04-28 NOTE — Telephone Encounter (Signed)
Please call to schedule lab appt (please see other phone note)

## 2021-04-28 NOTE — Telephone Encounter (Signed)
Wife called to make appts for pt. Call back at [phone]

## 2021-04-28 NOTE — Telephone Encounter (Signed)
Unable to reach patient. Left message to call back and set up lab appt.

## 2021-04-29 ENCOUNTER — Ambulatory Visit: Payer: Medicare PPO

## 2021-04-29 ENCOUNTER — Encounter: Payer: Medicare PPO | Admitting: Speech Pathology

## 2021-04-29 ENCOUNTER — Inpatient Hospital Stay: Payer: Medicare PPO

## 2021-04-29 ENCOUNTER — Other Ambulatory Visit: Payer: Self-pay

## 2021-04-29 DIAGNOSIS — R2689 Other abnormalities of gait and mobility: Secondary | ICD-10-CM | POA: Diagnosis not present

## 2021-04-29 DIAGNOSIS — N1831 Chronic kidney disease, stage 3a: Secondary | ICD-10-CM

## 2021-04-29 DIAGNOSIS — D7219 Other eosinophilia: Secondary | ICD-10-CM | POA: Diagnosis not present

## 2021-04-29 DIAGNOSIS — G20 Parkinson's disease: Secondary | ICD-10-CM

## 2021-04-29 DIAGNOSIS — R2681 Unsteadiness on feet: Secondary | ICD-10-CM

## 2021-04-29 LAB — COMP PANEL: LEUKEMIA/LYMPHOMA

## 2021-04-29 NOTE — Therapy (Signed)
[place] MAIN [facility] SERVICES [address] [place], [place], [address] Phone: [phone]   Fax:  [phone]  Physical Therapy Treatment  Patient Details  Name: KABE MCKOY MRN: [id number] Date of Birth: March 24, 1956 Referring Provider (PT): Benay Spice   Encounter Date: 04/29/2021   PT End of Session - 04/29/21 1052     Visit Number 42    Number of Visits 37    Date for PT Re-Evaluation 05/20/21    Authorization Type [employer] PPO; 4/10 PN 04/08/20    Authorization Time Period Cert 11/10/66-11/57/26    PT Start Time 1059    PT Stop Time 1144    PT Time Calculation (min) 45 min    Equipment Utilized During Treatment Gait belt    Activity Tolerance Patient tolerated treatment well    Behavior During Therapy WFL for tasks assessed/performed             Past Medical History:  Diagnosis Date   Abnormal ankle brachial index    Aortic atherosclerosis ([place])    Arthritis    hips and back   Asthma    as a baby-    Bronchiectasis without complication ([place])    CKD (chronic kidney disease) stage 59, GFR 30-59 ml/min (HCC)    Coronary artery calcification seen on CT scan    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 [facility])    ED (erectile dysfunction)    Eosinophilia    GERD (gastroesophageal reflux disease)    rare -    History of BPH    History of genital warts    History of solitary pulmonary nodule    Left and right lung nodules on CT in ER 1/20; stable by CT 2/21   Hypertriglyceridemia    Microscopic hematuria    Other chronic pain    Parkinson's disease ([place])    Pneumonia    age 58   Post-traumatic osteoarthritis of left knee    Rash    legs- on going   Seizure-like activity ([place])    Umbilical hernia     Past Surgical History:  Procedure Laterality Date   APPENDECTOMY     BACK SURGERY     posterior lumbar spine fusion L4-5   COLONOSCOPY     COLONOSCOPY WITH PROPOFOL N/A 06/06/2017   Procedure: COLONOSCOPY WITH  PROPOFOL;  Surgeon: Manya Silvas, MD;  Location: [facility] ENDOSCOPY;  Service: Endoscopy;  Laterality: N/A;   KNEE ARTHROSCOPY Bilateral    ACL   KNEE ARTHROSCOPY W/ ACL RECONSTRUCTION     RADIAL KERATOTOMY     ROTATOR CUFF REPAIR Bilateral    SHOULDER ARTHROSCOPY WITH BICEPS TENDON REPAIR Left 04/16/2019   Procedure: SHOULDER ARTHROSCOPY WITH BICEPS TENDON REPAIR, MINI OPEN SUPERIOR CAPSULAR RECONSTRUCTION, BICEPS TENODESIS;  Surgeon: Leim Fabry, MD;  Location: [facility] ORS;  Service: Orthopedics;  Laterality: Left;    There were no vitals filed for this visit.   Subjective Assessment - 04/29/21 1102     Subjective Patient and his wife report yesterday was not as good of a day, went to the movie theaters. Was tilting while sitting on the bed.    Patient is accompained by: Family member    Pertinent History Patient admitted to hospital 05/23/20 with code stroke and R sided weakness. Imaging negative for acute changes. PMH includes Parkinson's with autonomic instability, asthma, GERD depression, degenerative spondylolisthesis, focal seizures and migraine syndrome. Patient had three ED visits for falls since then. Patient reports when  he has the seizures he has pain in his temples and bright flashes in his vision. Last week had three falls in one day.    Limitations Lifting;Standing;Walking;House hold activities    How long can you sit comfortably? sleeps in easy chair.    How long can you stand comfortably? feel unsteady once in standing.    How long can you walk comfortably? difficult to walk during "off period"    Patient Stated Goals stability, have a device that will be most helpful, help to become and stay independent.    Currently in Pain? No/denies                       Take BP BP: 177/96  BP at end of session 176/96    Treatment:    Speed ladder:  One foot each square 4x length  One foot in one foot out laterally 4x length Zig zag one foot in one foot out.   7lb  dumbells:  -squat 10x with cues for posture/body mechanics and chair behind -single dumbbell deadlift modified with chair behind 10x -farmers carry 4x 30 ft with cues for upright posture, LOB with turning requires min A to remain upright  Standing in front of mirror: -table in front of patient with cones: multiple pattern variations performed with PT guidance with intermittent cueing for correction of posture, advanced after 3 minutes to addition of airex pad x 6 minutes -airex pad: throw ball at target x15 balls  Seated: RTB ER 15x RTB hamstring curl 15x  Pt educated throughout session about proper posture and technique with exercises. Improved exercise technique, movement at target joints, use of target muscles after min to mod verbal, visual, tactile cues.       Patient presents with excellent motivation to physical therapy session. Use of mirror for postural correction allowed for decreased external cues  and increase of patient's ability to self correct. Patient tolerates progressive standing and strengthening interventions.The pt will continue to benefit from skilled PT to improve coordination, gait, and balance to increase ease and safety with ADLs.                     PT Education - 04/29/21 1052     Education Details exercise technique, body mechanics    Person(s) Educated Patient    Methods Explanation;Demonstration;Tactile cues;Verbal cues    Comprehension Verbalized understanding;Returned demonstration;Verbal cues required;Tactile cues required              PT Short Term Goals - 10/21/20 0903       PT SHORT TERM GOAL #1   Title Patient will be independent in home exercise program to improve strength/mobility for better functional independence with ADLs.    Baseline 6/16: HEP to be given next session 7/19: HEP compliant    Time 4    Period Weeks    Status Achieved    Target Date 10/16/20               PT Long Term Goals - 04/08/21 1213        PT LONG TERM GOAL #1   Title Patient will increase FOTO score to equal to or greater than 51%    to demonstrate statistically significant improvement in mobility and quality of life.    Baseline 6/16: 48% 7/19: 57% 8/31: 50% 11/23: 53%    Time 12    Period Weeks    Status Achieved  PT LONG TERM GOAL #2   Title Patient will increase Berg Balance score by > 6 points (34/56)  to demonstrate decreased fall risk during functional activities.    Baseline 6/16: 28/56 7/19: 45/56    Time 12    Period Weeks    Status Achieved      PT LONG TERM GOAL #3   Title Patient will deny any falls over past 4 weeks to demonstrate improved safety awareness at home and work.    Baseline 6/16: multiple falls 7/19: no falls reported 9/7: 3 falls in past month; 2 falls in past month 11/23: 1 fall past month 1/4: at least 6    Time 12    Period Weeks    Status Partially Met    Target Date 05/20/21      PT LONG TERM GOAL #4   Title Patient will increase six minute walk test distance to >1300 for progression to age norm community ambulator and improve gait ability    Baseline 6/16: 945 ft with multiple near falls 7/19: 1495 ft    Time 12    Period Weeks    Status Achieved      PT LONG TERM GOAL #5   Title Patient will increase Berg Balance score by > 6 points (51/56)  to demonstrate decreased fall risk during functional activities.    Baseline 7/19: 45/56 8/31: 49/56; 10/14: 52/56 11/23: 52/56 1/4: 46/56    Time 12    Period Weeks    Status Partially Met    Target Date 05/20/21      PT LONG TERM GOAL #6   Title Patient will increase Functional Gait Assessment score to >20/30 as to reduce fall risk and improve dynamic gait safety with community ambulation.    Baseline 7/19: 16/30 8/31: 20/30;    Time 12    Period Weeks    Status Achieved      PT LONG TERM GOAL #7   Title Patient will increase Functional Gait Assessment score to >26/30 as to reduce fall risk and improve dynamic gait safety  with community ambulation.    Baseline 8/31: 20/30; 10/14: 22/30 11/23: 24/30    Time 12    Period Weeks    Status Partially Met    Target Date 05/20/21      PT LONG TERM GOAL #8   Title Patient will static stand for approximately 5 minutes and perturbations for shooting/teaching classes without loss of balance or requiring object to lean on.    Baseline Unable to perform without leaning on objects; 10/14: Still unable to perform without leaning on objects. 11/23: able to perform 5 minutes    Time 12    Period Weeks    Status Achieved    Target Date 05/20/21      PT LONG TERM GOAL  #9   TITLE Patient will be able to ambulate approximately 1 mile walking dog across changing surfaces such as grass, concrete, and sidewalk without loss of balance.    Baseline 9/7: unable to perform yet; 10/14: Unable 11/23: has not been able to attempt due to recent increase in "off times" 1/4 : unable    Time 12    Period Weeks    Status On-going    Target Date 05/20/21      PT LONG TERM GOAL  #10   TITLE Patient will bend down and pick up items without losing balance to perform iADLs such as emptying dishwasher and feeding dog.  Baseline 9/7: unable to perform; 10/14: Performing at home without falls or LOB.    Time 12    Period Weeks    Status Achieved      PT LONG TERM GOAL  #11   TITLE Patient will increase lower extremity functional scale to >60/80 to demonstrate improved functional mobility and increased tolerance with ADLs.    Baseline 11/23: 44 1/4: 40    Time 12    Period Weeks    Status New    Target Date 05/20/21      PT LONG TERM GOAL  #12   TITLE Patient will static stand and reach into pocket to grab item without LOB for improved stability and mobility.    Baseline 11/23; LOB reaching into pocket; 1/4: able to perform with only one episode of rocking with increased time    Time 12    Period Weeks    Status Partially Met    Target Date 05/20/21                    Plan - 04/29/21 1212     Clinical Impression Statement Patient presents with excellent motivation to physical therapy session. Use of mirror for postural correction allowed for decreased external cues  and increase of patient's ability to self correct. Patient tolerates progressive standing and strengthening interventions.The pt will continue to benefit from skilled PT to improve coordination, gait, and balance to increase ease and safety with ADLs.    Personal Factors and Comorbidities Age;Comorbidity 3+;Fitness;Past/Current Experience;Time since onset of injury/illness/exacerbation;Transportation    Comorbidities Parkinson's with autonomic instability, asthma, GERD depression, degenerative spondylolisthesis, focal seizures and migraine syndrome    Examination-Activity Limitations Bathing;Bed Mobility;Bend;Caring for [employer];Locomotion Level;Lift;Hygiene/Grooming;Squat;Stairs;Stand;Toileting;Transfers    Examination-Participation Restrictions Church;Cleaning;Community Activity;Driving;Meal Prep;Medication Management;Laundry;Shop;Volunteer;Yard Work    [employer]chant [employer] officer Evolving/Moderate complexity    Rehab Potential Fair    PT Frequency 2x / week    PT Duration 12 weeks    PT Treatment/Interventions ADLs/Self Care Home Management;Biofeedback;Canalith Repostioning;Cryotherapy;Parrafin;Ultrasound;Traction;Moist Heat;Iontophoresis 69m/ml Dexamethasone;Electrical Stimulation;DME Instruction;Gait training;Stair training;Functional mobility training;Neuromuscular re-education;Balance training;Therapeutic exercise;Therapeutic activities;Patient/family education;Orthotic Fit/Training;Manual techniques;Passive range of motion;Manual lymph drainage;Energy conservation;Splinting;Taping;Vasopneumatic Device;Vestibular;Joint Manipulations;Visual/perceptual remediation/compensation    PT Next Visit Plan Increase cognitive demand/dual tasking during gait, progress thoracic  opening and rotaiton exercises, core strengthening, progress dynamic balance and gait training for increased BOS and dual tasking. Add core strengthening to HEP. Add dynadisc tasks with backwards rotational component.    PT Home Exercise Plan No updates today    Consulted and Agree with Plan of Care Patient    Family Member Consulted wife             Patient will benefit from skilled therapeutic intervention in order to improve the following deficits and impairments:  Abnormal gait, Decreased activity tolerance, Decreased balance, Decreased knowledge of precautions, Decreased endurance, Decreased coordination, Decreased cognition, Decreased knowledge of use of DME, Decreased mobility, Decreased safety awareness, Difficulty walking, Decreased strength, Impaired flexibility, Impaired perceived functional ability, Impaired sensation, Impaired tone, Impaired UE functional use, Impaired vision/preception, Improper body mechanics, Postural dysfunction  Visit Diagnosis: [place]'s disease [place]  Other abnormalities of gait and mobility  Unsteadiness on feet     Problem List Patient Active Problem List   Diagnosis Date Noted   Eosinophilia 04/27/2021   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[place] 04/21/2021   Thoracic aortic aneurysm 04/21/2021   Focal seizure ([place] 05/24/2020   [place]'s [place] ([place]    History of migraine headaches    Migraine syndrome  BPH (benign prostatic hyperplasia)    Depression    Degenerative spondylolisthesis 05/07/2016   Janna Arch, PT, DPT  04/29/2021, 12:13 PM  [place] MAIN [facility] SERVICES [address] [place], [place], [address] Phone: [phone]   Fax:  [phone]  Name: ARTEMUS ROMANOFF MRN: [id number] Date of Birth: 04-14-1955

## 2021-04-30 LAB — KAPPA/LAMBDA LIGHT CHAINS
Kappa free light chain: 161.1 mg/L — ABNORMAL HIGH (ref 3.3–19.4)
Kappa, lambda light chain ratio: 1.94 — ABNORMAL HIGH (ref 0.26–1.65)
Lambda free light chains: 83.1 mg/L — ABNORMAL HIGH (ref 5.7–26.3)

## 2021-04-30 LAB — EBV AB TO VIRAL CAPSID AG PNL, IGG+IGM
EBV VCA IgG: >600 U/mL — ABNORMAL HIGH (ref 0.0–17.9)
EBV VCA IgM: <36 U/mL (ref 0.0–35.9)

## 2021-05-01 LAB — BCR-ABL1 FISH
Cells Analyzed: 200
Cells Counted: 200

## 2021-05-04 ENCOUNTER — Encounter: Payer: Medicare PPO | Admitting: Speech Pathology

## 2021-05-04 ENCOUNTER — Ambulatory Visit: Payer: Medicare PPO

## 2021-05-04 ENCOUNTER — Other Ambulatory Visit: Payer: Self-pay

## 2021-05-04 DIAGNOSIS — R2689 Other abnormalities of gait and mobility: Secondary | ICD-10-CM

## 2021-05-04 DIAGNOSIS — G20 Parkinson's disease: Secondary | ICD-10-CM

## 2021-05-04 DIAGNOSIS — R2681 Unsteadiness on feet: Secondary | ICD-10-CM

## 2021-05-04 LAB — MULTIPLE MYELOMA PANEL, SERUM
Albumin SerPl Elph-Mcnc: 3.4 g/dL (ref 2.9–4.4)
Albumin/Glob SerPl: 0.8 (ref 0.7–1.7)
Alpha 1: 0.3 g/dL (ref 0.0–0.4)
Alpha2 Glob SerPl Elph-Mcnc: 0.8 g/dL (ref 0.4–1.0)
B-Globulin SerPl Elph-Mcnc: 0.9 g/dL (ref 0.7–1.3)
Gamma Glob SerPl Elph-Mcnc: 2.5 g/dL — ABNORMAL HIGH (ref 0.4–1.8)
Globulin, Total: 4.5 g/dL — ABNORMAL HIGH (ref 2.2–3.9)
IgA: 206 mg/dL (ref 61–437)
IgG (Immunoglobin G), Serum: 2309 mg/dL — ABNORMAL HIGH (ref 603–1613)
IgM (Immunoglobulin M), Srm: 109 mg/dL (ref 20–172)
Total Protein ELP: 7.9 g/dL (ref 6.0–8.5)

## 2021-05-04 NOTE — Therapy (Signed)
[place] MAIN [facility] SERVICES [address] [place], [place], [address] Phone: [phone]   Fax:  [phone]  Physical Therapy Treatment  Patient Details  Name: Cory [name] MRN: [id number] Date of Birth: 03/19/1956 Referring Provider (PT): Benay Spice   Encounter Date: 05/04/2021   PT End of Session - 05/04/21 1131     Visit Number 89    Number of Visits 64    Date for PT Re-Evaluation 05/20/21    Authorization Type [employer] PPO; 5/10 PN 04/08/20    Authorization Time Period Cert 10/03/04-26/94/85    PT Start Time 1104    PT Stop Time 1145    PT Time Calculation (min) 41 min    Equipment Utilized During Treatment Gait belt    Activity Tolerance Patient tolerated treatment well    Behavior During Therapy WFL for tasks assessed/performed             Past Medical History:  Diagnosis Date   Abnormal ankle brachial index    Aortic atherosclerosis ([place])    Arthritis    hips and back   Asthma    as a baby-    Bronchiectasis without complication ([place])    CKD (chronic kidney disease) stage 45, GFR 30-59 ml/min (HCC)    Coronary artery calcification seen on CT scan    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 [facility])    ED (erectile dysfunction)    Eosinophilia    GERD (gastroesophageal reflux disease)    rare -    History of BPH    History of genital warts    History of solitary pulmonary nodule    Left and right lung nodules on CT in ER 1/20; stable by CT 2/21   Hypertriglyceridemia    Microscopic hematuria    Other chronic pain    Parkinson's disease ([place])    Pneumonia    age 44   Post-traumatic osteoarthritis of left knee    Rash    legs- on going   Seizure-like activity ([place])    Umbilical hernia     Past Surgical History:  Procedure Laterality Date   APPENDECTOMY     BACK SURGERY     posterior lumbar spine fusion L4-5   COLONOSCOPY     COLONOSCOPY WITH PROPOFOL N/A 06/06/2017   Procedure: COLONOSCOPY WITH  PROPOFOL;  Surgeon: Manya Silvas, MD;  Location: [facility] ENDOSCOPY;  Service: Endoscopy;  Laterality: N/A;   KNEE ARTHROSCOPY Bilateral    ACL   KNEE ARTHROSCOPY W/ ACL RECONSTRUCTION     RADIAL KERATOTOMY     ROTATOR CUFF REPAIR Bilateral    SHOULDER ARTHROSCOPY WITH BICEPS TENDON REPAIR Left 04/16/2019   Procedure: SHOULDER ARTHROSCOPY WITH BICEPS TENDON REPAIR, MINI OPEN SUPERIOR CAPSULAR RECONSTRUCTION, BICEPS TENODESIS;  Surgeon: Leim Fabry, MD;  Location: [facility] ORS;  Service: Orthopedics;  Laterality: Left;    There were no vitals filed for this visit.   Subjective Assessment - 05/04/21 1116     Subjective Patient presents with cane. Had two falls since last session; one involved tripping on chair.    Patient is accompained by: Family member    Pertinent History Patient admitted to hospital 05/23/20 with code stroke and R sided weakness. Imaging negative for acute changes. PMH includes Parkinson's with autonomic instability, asthma, GERD depression, degenerative spondylolisthesis, focal seizures and migraine syndrome. Patient had three ED visits for falls since then. Patient reports when he has the seizures he has pain in his temples  and bright flashes in his vision. Last week had three falls in one day.    Limitations Lifting;Standing;Walking;House hold activities    How long can you sit comfortably? sleeps in easy chair.    How long can you stand comfortably? feel unsteady once in standing.    How long can you walk comfortably? difficult to walk during "off period"    Patient Stated Goals stability, have a device that will be most helpful, help to become and stay independent.    Currently in Pain? No/denies               BP: 153/85  Patient presents with cane. Had two falls since last session  Neuro Re-ed Next to support bar:  -modified tandem stance 2x30 seconds with airex pad and dynadisc  -6" step airex pad sandwhich lateral stepping with focus on foot placement x10  each side -on 6 " step, SUE support forward step onto airex pad, backward step onto ariex pad alternating 10x; then switch for opposite LE  Sit to stand with airex pad under feet 10x; arms crossed close CGA      TherEx posterior lunge 10x each LE Lateral lunges 8x each LE Three way hip hike 10x each LE, BUE support  GTB hamstring curl 15x each LE seated   Pt educated throughout session about proper posture and technique with exercises. Improved exercise technique, movement at target joints, use of target muscles after min to mod verbal, visual, tactile cues.    Patient tolerated progressive stability and strengthening interventions. Multiple cues for decreasing velocity to improve spatial awareness required throughout session. Discussion on potential transition to silver sneakers and/or forever fit was received well. The pt will continue to benefit from skilled PT to improve coordination, gait, and balance to increase ease and safety with ADLs.            PT Education - 05/04/21 1131     Education Details exercise technique, body mechanics    Person(s) Educated Patient    Methods Explanation;Demonstration;Tactile cues;Verbal cues    Comprehension Verbalized understanding;Returned demonstration;Verbal cues required;Tactile cues required              PT Short Term Goals - 10/21/20 0903       PT SHORT TERM GOAL #1   Title Patient will be independent in home exercise program to improve strength/mobility for better functional independence with ADLs.    Baseline 6/16: HEP to be given next session 7/19: HEP compliant    Time 4    Period Weeks    Status Achieved    Target Date 10/16/20               PT Long Term Goals - 04/08/21 1213       PT LONG TERM GOAL #1   Title Patient will increase FOTO score to equal to or greater than 51%    to demonstrate statistically significant improvement in mobility and quality of life.    Baseline 6/16: 48% 7/19: 57% 8/31:  50% 11/23: 53%    Time 12    Period Weeks    Status Achieved      PT LONG TERM GOAL #2   Title Patient will increase Berg Balance score by > 6 points (34/56)  to demonstrate decreased fall risk during functional activities.    Baseline 6/16: 28/56 7/19: 45/56    Time 12    Period Weeks    Status Achieved      PT LONG  TERM GOAL #3   Title Patient will deny any falls over past 4 weeks to demonstrate improved safety awareness at home and work.    Baseline 6/16: multiple falls 7/19: no falls reported 9/7: 3 falls in past month; 2 falls in past month 11/23: 1 fall past month 1/4: at least 6    Time 12    Period Weeks    Status Partially Met    Target Date 05/20/21      PT LONG TERM GOAL #4   Title Patient will increase six minute walk test distance to >1300 for progression to age norm community ambulator and improve gait ability    Baseline 6/16: 945 ft with multiple near falls 7/19: 1495 ft    Time 12    Period Weeks    Status Achieved      PT LONG TERM GOAL #5   Title Patient will increase Berg Balance score by > 6 points (51/56)  to demonstrate decreased fall risk during functional activities.    Baseline 7/19: 45/56 8/31: 49/56; 10/14: 52/56 11/23: 52/56 1/4: 46/56    Time 12    Period Weeks    Status Partially Met    Target Date 05/20/21      PT LONG TERM GOAL #6   Title Patient will increase Functional Gait Assessment score to >20/30 as to reduce fall risk and improve dynamic gait safety with community ambulation.    Baseline 7/19: 16/30 8/31: 20/30;    Time 12    Period Weeks    Status Achieved      PT LONG TERM GOAL #7   Title Patient will increase Functional Gait Assessment score to >26/30 as to reduce fall risk and improve dynamic gait safety with community ambulation.    Baseline 8/31: 20/30; 10/14: 22/30 11/23: 24/30    Time 12    Period Weeks    Status Partially Met    Target Date 05/20/21      PT LONG TERM GOAL #8   Title Patient will static stand for  approximately 5 minutes and perturbations for shooting/teaching classes without loss of balance or requiring object to lean on.    Baseline Unable to perform without leaning on objects; 10/14: Still unable to perform without leaning on objects. 11/23: able to perform 5 minutes    Time 12    Period Weeks    Status Achieved    Target Date 05/20/21      PT LONG TERM GOAL  #9   TITLE Patient will be able to ambulate approximately 1 mile walking dog across changing surfaces such as grass, concrete, and sidewalk without loss of balance.    Baseline 9/7: unable to perform yet; 10/14: Unable 11/23: has not been able to attempt due to recent increase in "off times" 1/4 : unable    Time 12    Period Weeks    Status On-going    Target Date 05/20/21      PT LONG TERM GOAL  #10   TITLE Patient will bend down and pick up items without losing balance to perform iADLs such as emptying dishwasher and feeding dog.    Baseline 9/7: unable to perform; 10/14: Performing at home without falls or LOB.    Time 12    Period Weeks    Status Achieved      PT LONG TERM GOAL  #11   TITLE Patient will increase lower extremity functional scale to >60/80 to demonstrate improved functional mobility and  increased tolerance with ADLs.    Baseline 11/23: 44 1/4: 40    Time 12    Period Weeks    Status New    Target Date 05/20/21      PT LONG TERM GOAL  #12   TITLE Patient will static stand and reach into pocket to grab item without LOB for improved stability and mobility.    Baseline 11/23; LOB reaching into pocket; 1/4: able to perform with only one episode of rocking with increased time    Time 12    Period Weeks    Status Partially Met    Target Date 05/20/21                   Plan - 05/04/21 1207     Clinical Impression Statement Patient tolerated progressive stability and strengthening interventions. Multiple cues for decreasing velocity to improve spatial awareness required throughout session.  Discussion on potential transition to silver sneakers and/or forever fit was received well. The pt will continue to benefit from skilled PT to improve coordination, gait, and balance to increase ease and safety with ADLs.    Personal Factors and Comorbidities Age;Comorbidity 3+;Fitness;Past/Current Experience;Time since onset of injury/illness/exacerbation;Transportation    Comorbidities Parkinson's with autonomic instability, asthma, GERD depression, degenerative spondylolisthesis, focal seizures and migraine syndrome    Examination-Activity Limitations Bathing;Bed Mobility;Bend;Caring for [employer];Locomotion Level;Lift;Hygiene/Grooming;Squat;Stairs;Stand;Toileting;Transfers    Examination-Participation Restrictions Church;Cleaning;Community Activity;Driving;Meal Prep;Medication Management;Laundry;Shop;Volunteer;Yard Work    [employer]chant [employer] officer Evolving/Moderate complexity    Rehab Potential Fair    PT Frequency 2x / week    PT Duration 12 weeks    PT Treatment/Interventions ADLs/Self Care Home Management;Biofeedback;Canalith Repostioning;Cryotherapy;Parrafin;Ultrasound;Traction;Moist Heat;Iontophoresis 25m/ml Dexamethasone;Electrical Stimulation;DME Instruction;Gait training;Stair training;Functional mobility training;Neuromuscular re-education;Balance training;Therapeutic exercise;Therapeutic activities;Patient/family education;Orthotic Fit/Training;Manual techniques;Passive range of motion;Manual lymph drainage;Energy conservation;Splinting;Taping;Vasopneumatic Device;Vestibular;Joint Manipulations;Visual/perceptual remediation/compensation    PT Next Visit Plan Increase cognitive demand/dual tasking during gait, progress thoracic opening and rotaiton exercises, core strengthening, progress dynamic balance and gait training for increased BOS and dual tasking. Add core strengthening to HEP. Add dynadisc tasks with backwards rotational component.    PT Home  Exercise Plan No updates today    Consulted and Agree with Plan of Care Patient    Family Member Consulted wife             Patient will benefit from skilled therapeutic intervention in order to improve the following deficits and impairments:  Abnormal gait, Decreased activity tolerance, Decreased balance, Decreased knowledge of precautions, Decreased endurance, Decreased coordination, Decreased cognition, Decreased knowledge of use of DME, Decreased mobility, Decreased safety awareness, Difficulty walking, Decreased strength, Impaired flexibility, Impaired perceived functional ability, Impaired sensation, Impaired tone, Impaired UE functional use, Impaired vision/preception, Improper body mechanics, Postural dysfunction  Visit Diagnosis: Parkinson's disease ([place]  Other abnormalities of gait and mobility  Unsteadiness on feet     Problem List Patient Active Problem List   Diagnosis Date Noted   Eosinophilia 04/27/2021   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[place] 04/21/2021   Thoracic aortic aneurysm 04/21/2021   Focal seizure ([place] 05/24/2020   Parkinson's disease ([place]    History of migraine headaches    Migraine syndrome    BPH (benign prostatic hyperplasia)    Depression    Degenerative spondylolisthesis 05/07/2016    MJanna Arch PT, DPT  05/04/2021, 12:10 PM  [facility] [address] [address]Phone: [phone]  Fax:  [phone] Name: Cory RACZKAMRN: [id number]  of Birth: 1955/11/21

## 2021-05-05 LAB — MISC LABCORP TEST (SEND OUT): Labcorp test code: 511444

## 2021-05-06 ENCOUNTER — Encounter: Payer: Medicare PPO | Admitting: Speech Pathology

## 2021-05-06 ENCOUNTER — Ambulatory Visit: Payer: Medicare PPO | Admitting: Speech Pathology

## 2021-05-06 ENCOUNTER — Ambulatory Visit: Payer: Medicare PPO | Attending: Internal Medicine | Admitting: Physical Therapy

## 2021-05-06 ENCOUNTER — Encounter: Payer: Self-pay | Admitting: Speech Pathology

## 2021-05-06 ENCOUNTER — Other Ambulatory Visit: Payer: Self-pay

## 2021-05-06 ENCOUNTER — Ambulatory Visit: Payer: Medicare PPO

## 2021-05-06 DIAGNOSIS — R2689 Other abnormalities of gait and mobility: Secondary | ICD-10-CM | POA: Insufficient documentation

## 2021-05-06 DIAGNOSIS — R471 Dysarthria and anarthria: Secondary | ICD-10-CM | POA: Insufficient documentation

## 2021-05-06 DIAGNOSIS — G20 Parkinson's disease: Secondary | ICD-10-CM | POA: Diagnosis not present

## 2021-05-06 DIAGNOSIS — R278 Other lack of coordination: Secondary | ICD-10-CM | POA: Diagnosis present

## 2021-05-06 DIAGNOSIS — R2681 Unsteadiness on feet: Secondary | ICD-10-CM | POA: Diagnosis present

## 2021-05-06 DIAGNOSIS — R41841 Cognitive communication deficit: Secondary | ICD-10-CM | POA: Insufficient documentation

## 2021-05-06 DIAGNOSIS — M6281 Muscle weakness (generalized): Secondary | ICD-10-CM | POA: Diagnosis present

## 2021-05-06 NOTE — Therapy (Signed)
[place] MAIN [facility] SERVICES [address] [place], [place], [address] Phone: [phone]   Fax:  [phone]  Physical Therapy Treatment  Patient Details  Name: Cory [name] MRN: [id number] Date of Birth: 25-Jan-1956 Referring Provider (PT): Benay Spice   Encounter Date: 05/06/2021   PT End of Session - 05/06/21 1243     Visit Number 72    Number of Visits 76    Date for PT Re-Evaluation 05/20/21    Authorization Type [employer] PPO; 5/10 PN 04/08/20    Authorization Time Period Cert 06/06/98-76/22/63    PT Start Time 1105    PT Stop Time 1145    PT Time Calculation (min) 40 min    Equipment Utilized During Treatment Gait belt    Activity Tolerance Patient tolerated treatment well    Behavior During Therapy WFL for tasks assessed/performed             Past Medical History:  Diagnosis Date   Abnormal ankle brachial index    Aortic atherosclerosis ([place])    Arthritis    hips and back   Asthma    as a baby-    Bronchiectasis without complication ([place])    CKD (chronic kidney disease) stage 67, GFR 30-59 ml/min (HCC), GFR 30-59 ml/min (HCC)    Coronary artery calcification seen on CT scan    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 [facility])    ED (erectile dysfunction)    Eosinophilia    GERD (gastroesophageal reflux disease)    rare -    History of BPH    History of genital warts    History of solitary pulmonary nodule    Left and right lung nodules on CT in ER 1/20; stable by CT 2/21   Hypertriglyceridemia    Microscopic hematuria    Other chronic pain    Parkinson's disease ([place])    Pneumonia    age 66   Post-traumatic osteoarthritis of left knee    Rash    legs- on going   Seizure-like activity ([place])    Umbilical hernia     Past Surgical History:  Procedure Laterality Date   APPENDECTOMY     BACK SURGERY     posterior lumbar spine fusion L4-5   COLONOSCOPY     COLONOSCOPY WITH PROPOFOL N/A 06/06/2017   Procedure: COLONOSCOPY WITH  PROPOFOL;  Surgeon: Manya Silvas, MD;  Location[facility]COPY;  Service: Endoscopy;  Laterality: N/A;   KNEE ARTHROSCOPY Bilateral    ACL   KNEE ARTHROSCOPY W/ ACL RECONSTRUCTION     RADIAL KERATOTOMY     ROTATOR CUFF REPAIR Bilateral    SHOULDER ARTHROSCOPY WITH BICEPS TENDON REPAIR Left 04/16/2019   Procedure: SHOULDER ARTHROSCOPY WITH BICEPS TENDON REPAIR, MINI OPEN SUPERIOR CAPSULAR RECONSTRUCTION, BICEPS TENODESIS;  Surgeon: Leim Fabry, MD;  Location: [facility] ORS;  Service: Orthopedics;  Laterality: Left;    There were no vitals filed for this visit.   Subjective Assessment - 05/06/21 1024     Subjective Pt presents with Rollator. Had one fall last night in which he fell backwards, did not hit his head and denies pain. Pt has a Silver Therapist, art at [employer] this afternoon. Wife states pt balance is off today and he has some brain fog.    Patient is accompained by: Family member    Pertinent History Patient admitted to hospital 05/23/20 with code stroke and R sided weakness. Imaging negative for acute changes. PMH includes Parkinson's with autonomic instability, asthma, GERD  depression, degenerative spondylolisthesis, focal seizures and migraine syndrome. Patient had three ED visits for falls since then. Patient reports when he has the seizures he has pain in his temples and bright flashes in his vision. Last week had three falls in one day.    Limitations Lifting;Standing;Walking;House hold activities    How long can you sit comfortably? sleeps in easy chair.    How long can you stand comfortably? feel unsteady once in standing.    How long can you walk comfortably? difficult to walk during "off period"    Patient Stated Goals stability, have a device that will be most helpful, help to become and stay independent.    Currently in Pain? No/denies                BP: 164/85     Neuro Re-ed Next to support bar:  -modified tandem stance, 30 seconds each LE; -modified  tandem stance 2x30 seconds each LE on airex pad;  -tandem stance on half foam roll (curved side up) 2 x 45 seconds each LE, SUE support with LOB typically to the right when hand is removed; -6" step with airex pad on top, lateral stepping onto step with focus on foot placement and large amplitude, 2 x 10 each side      TherEx Forward lunge onto airex pad, 10x each LE Lateral lunges onto airex pad, 10x each LE Standing iron cross RTB 2 x 15, fatigues quickly;    Pt educated throughout session about proper posture and technique with exercises. Improved exercise technique, movement at target joints, use of target muscles after min to mod verbal, visual, tactile cues.     Patient is motivated throughout session; wife is present and encouraging, reminds pt to speak with a loud voice. He tolerated strengthening and stability exercises well; muscular fatigue was noted and rest breaks given accordingly. Cues provided for spatial awareness, safety and amplitude of movement. The pt will continue to benefit from skilled PT to improve coordination, gait, and balance to increase ease and safety with ADLs.           PT Short Term Goals - 10/21/20 0903       PT SHORT TERM GOAL #1   Title Patient will be independent in home exercise program to improve strength/mobility for better functional independence with ADLs.    Baseline 6/16: HEP to be given next session 7/19: HEP compliant    Time 4    Period Weeks    Status Achieved    Target Date 10/16/20               PT Long Term Goals - 04/08/21 1213       PT LONG TERM GOAL #1   Title Patient will increase FOTO score to equal to or greater than 51%    to demonstrate statistically significant improvement in mobility and quality of life.    Baseline 6/16: 48% 7/19: 57% 8/31: 50% 11/23: 53%    Time 12    Period Weeks    Status Achieved      PT LONG TERM GOAL #2   Title Patient will increase Berg Balance score by > 6 points (34/56)  to  demonstrate decreased fall risk during functional activities.    Baseline 6/16: 28/56 7/19: 45/56    Time 12    Period Weeks    Status Achieved      PT LONG TERM GOAL #3   Title Patient will deny any falls over  past 4 weeks to demonstrate improved safety awareness at home and work.    Baseline 6/16: multiple falls 7/19: no falls reported 9/7: 3 falls in past month; 2 falls in past month 11/23: 1 fall past month 1/4: at least 6    Time 12    Period Weeks    Status Partially Met    Target Date 05/20/21      PT LONG TERM GOAL #4   Title Patient will increase six minute walk test distance to >1300 for progression to age norm community ambulator and improve gait ability    Baseline 6/16: 945 ft with multiple near falls 7/19: 1495 ft    Time 12    Period Weeks    Status Achieved      PT LONG TERM GOAL #5   Title Patient will increase Berg Balance score by > 6 points (51/56)  to demonstrate decreased fall risk during functional activities.    Baseline 7/19: 45/56 8/31: 49/56; 10/14: 52/56 11/23: 52/56 1/4: 46/56    Time 12    Period Weeks    Status Partially Met    Target Date 05/20/21      PT LONG TERM GOAL #6   Title Patient will increase Functional Gait Assessment score to >20/30 as to reduce fall risk and improve dynamic gait safety with community ambulation.    Baseline 7/19: 16/30 8/31: 20/30;    Time 12    Period Weeks    Status Achieved      PT LONG TERM GOAL #7   Title Patient will increase Functional Gait Assessment score to >26/30 as to reduce fall risk and improve dynamic gait safety with community ambulation.    Baseline 8/31: 20/30; 10/14: 22/30 11/23: 24/30    Time 12    Period Weeks    Status Partially Met    Target Date 05/20/21      PT LONG TERM GOAL #8   Title Patient will static stand for approximately 5 minutes and perturbations for shooting/teaching classes without loss of balance or requiring object to lean on.    Baseline Unable to perform without  leaning on objects; 10/14: Still unable to perform without leaning on objects. 11/23: able to perform 5 minutes    Time 12    Period Weeks    Status Achieved    Target Date 05/20/21      PT LONG TERM GOAL  #9   TITLE Patient will be able to ambulate approximately 1 mile walking dog across changing surfaces such as grass, concrete, and sidewalk without loss of balance.    Baseline 9/7: unable to perform yet; 10/14: Unable 11/23: has not been able to attempt due to recent increase in "off times" 1/4 : unable    Time 12    Period Weeks    Status On-going    Target Date 05/20/21      PT LONG TERM GOAL  #10   TITLE Patient will bend down and pick up items without losing balance to perform iADLs such as emptying dishwasher and feeding dog.    Baseline 9/7: unable to perform; 10/14: Performing at home without falls or LOB.    Time 12    Period Weeks    Status Achieved      PT LONG TERM GOAL  #11   TITLE Patient will increase lower extremity functional scale to >60/80 to demonstrate improved functional mobility and increased tolerance with ADLs.    Baseline 11/23: 44 1/4: 40  Time 12    Period Weeks    Status New    Target Date 05/20/21      PT LONG TERM GOAL  #12   TITLE Patient will static stand and reach into pocket to grab item without LOB for improved stability and mobility.    Baseline 11/23; LOB reaching into pocket; 1/4: able to perform with only one episode of rocking with increased time    Time 12    Period Weeks    Status Partially Met    Target Date 05/20/21                   Plan - 05/06/21 1243     Clinical Impression Statement Patient is motivated throughout session; wife is present and encouraging, reminds pt to speak with a loud voice. He tolerated strengthening and stability exercises well; muscular fatigue was noted and rest breaks given accordingly. Cues provided for spatial awareness, safety and amplitude of movement. The pt will continue to benefit  from skilled PT to improve coordination, gait, and balance to increase ease and safety with ADLs.    Personal Factors and Comorbidities Age;Comorbidity 3+;Fitness;Past/Current Experience;Time since onset of injury/illness/exacerbation;Transportation    Comorbidities Parkinson's with autonomic instability, asthma, GERD depression, degenerative spondylolisthesis, focal seizures and migraine syndrome    Examination-Activity Limitations Bathing;Bed Mobility;Bend;Caring for [employer];Locomotion Level;Lift;Hygiene/Grooming;Squat;Stairs;Stand;Toileting;Transfers    Examination-Participation Restrictions Church;Cleaning;Community Activity;Driving;Meal Prep;Medication Management;Laundry;Shop;Volunteer;Yard Work    Merchant navy officer Evolving/Moderate complexity    Rehab Potential Fair    PT Frequency 2x / week    PT Duration 12 weeks    PT Treatment/Interventions ADLs/Self Care Home Management;Biofeedback;Canalith Repostioning;Cryotherapy;Parrafin;Ultrasound;Traction;Moist Heat;Iontophoresis 107m/ml Dexamethasone;Electrical Stimulation;DME Instruction;Gait training;Stair training;Functional mobility training;Neuromuscular re-education;Balance training;Therapeutic exercise;Therapeutic activities;Patient/family education;Orthotic Fit/Training;Manual techniques;Passive range of motion;Manual lymph drainage;Energy conservation;Splinting;Taping;Vasopneumatic Device;Vestibular;Joint Manipulations;Visual/perceptual remediation/compensation    PT Next Visit Plan Increase cognitive demand/dual tasking during gait, progress thoracic opening and rotaiton exercises, core strengthening, progress dynamic balance and gait training for increased BOS and dual tasking. Add core strengthening to HEP. Add dynadisc tasks with backwards rotational component.    PT Home Exercise Plan No updates today    Consulted and Agree with Plan of Care Patient    Family Member Consulted wife              Patient will benefit from skilled therapeutic intervention in order to improve the following deficits and impairments:  Abnormal gait, Decreased activity tolerance, Decreased balance, Decreased knowledge of precautions, Decreased endurance, Decreased coordination, Decreased cognition, Decreased knowledge of use of DME, Decreased mobility, Decreased safety awareness, Difficulty walking, Decreased strength, Impaired flexibility, Impaired perceived functional ability, Impaired sensation, Impaired tone, Impaired UE functional use, Impaired vision/preception, Improper body mechanics, Postural dysfunction  Visit Diagnosis: Parkinson's disease ([place]  Other abnormalities of gait and mobility  Unsteadiness on feet  Cognitive communication deficit  Dysarthria and anarthria  Other lack of coordination  Muscle weakness (generalized)     Problem List Patient Active Problem List   Diagnosis Date Noted   Eosinophilia 04/27/2021   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[place] 04/21/2021   Thoracic aortic aneurysm 04/21/2021   Focal seizure ([place] 05/24/2020   Parkinson's disease ([place]    History of migraine headaches    Migraine syndrome    BPH (benign prostatic hyperplasia)    Depression    Degenerative spondylolisthesis 05/07/2016    KPatrina LeveringPT, DPT 05/06/21 12:46 PM [address] [address]  Phone: [phone]   Fax:  [phone]  Name: LOGHAN KURTZMAN MRN: [id number] Date of Birth: Dec 15, 1955

## 2021-05-06 NOTE — Therapy (Signed)
Placed phone call to Dr. 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 Spoke with Rosaria Ferries who confirmed she will make request for order.  Deneise Lever, MS, [place] MAIN [facility] SERVICES [address] [place], [place], [address] Phone: [phone]   Fax:  [phone]  Patient Details  Name: Cory [name] MRN: [id number] Date of Birth: 07-Feb-1956 Referring Provider:  No ref. provider found  Encounter Date: 05/06/2021   Aliene Altes, [place] 05/06/2021, 1:25 PM  [place] MAIN [facility] SERVICES [address] [place], [place], [address] Phone: [phone]   Fax:  [phone]

## 2021-05-06 NOTE — Therapy (Signed)
[place] MAIN [facility] SERVICES [address] [place], [place], [address] Phone: [phone]   Fax:  [phone]  Speech Language Pathology Treatment and Discharge Summary  Patient Details  Name: Cory [name] MRN: [id number] Date of Birth: 1956-02-22 Referring Provider (SLP): Ramonita Lab III, MD  SPEECH THERAPY DISCHARGE SUMMARY  Visits from Start of Care: 8  Current functional level related to goals / functional outcomes: Pt met 2/3 LTGs.   Remaining deficits: Moderate cognitive communication deficits persist   Education / Equipment: Use of schedule, timers and alerts on phone, course of therapy for dysarthria   Patient agrees to discharge. Patient goals were partially met. Patient is being discharged due to being pleased with the current functional level..    Encounter Date: 05/06/2021   End of Session - 05/06/21 1306     Visit Number 8    Number of Visits 9    Date for SLP Re-Evaluation 05/06/21    Authorization Type [employer]    SLP Start Time 0900    SLP Stop Time  1000    SLP Time Calculation (min) 60 min    Activity Tolerance Patient tolerated treatment well             Past Medical History:  Diagnosis Date   Abnormal ankle brachial index    Aortic atherosclerosis (HCC)    Arthritis    hips and back   Asthma    as a baby-    Bronchiectasis without complication (HCC)    CKD (chronic kidney disease) stage 67, GFR 30-59 ml/min (HCC)    Coronary artery calcification seen on CT scan    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 [facility])    ED (erectile dysfunction)    Eosinophilia    GERD (gastroesophageal reflux disease)    rare -    History of BPH    History of genital warts    History of solitary pulmonary nodule    Left and right lung nodules on CT in ER 1/20; stable by CT 2/21   Hypertriglyceridemia    Microscopic hematuria    Other chronic pain    Parkinson's disease ([place])    Pneumonia    age 66   Post-traumatic  osteoarthritis of left knee    Rash    legs- on going   Seizure-like activity ([place])    Umbilical hernia     Past Surgical History:  Procedure Laterality Date   APPENDECTOMY     BACK SURGERY     posterior lumbar spine fusion L4-5   COLONOSCOPY     COLONOSCOPY WITH PROPOFOL N/A 06/06/2017   Procedure: COLONOSCOPY WITH PROPOFOL;  Surgeon: Manya Silvas, MD;  Location: [facility] ENDOSCOPY;  Service: Endoscopy;  Laterality: N/A;   KNEE ARTHROSCOPY Bilateral    ACL   KNEE ARTHROSCOPY W/ ACL RECONSTRUCTION     RADIAL KERATOTOMY     ROTATOR CUFF REPAIR Bilateral    SHOULDER ARTHROSCOPY WITH BICEPS TENDON REPAIR Left 04/16/2019   Procedure: SHOULDER ARTHROSCOPY WITH BICEPS TENDON REPAIR, MINI OPEN SUPERIOR CAPSULAR RECONSTRUCTION, BICEPS TENODESIS;  Surgeon: Leim Fabry, MD;  Location: [facility] ORS;  Service: Orthopedics;  Laterality: Left;    There were no vitals filed for this visit.   Subjective Assessment - 05/06/21 1300     Subjective Reports he set all alarms on his new phone    Patient is accompained by: --   wife present for 2nd half of session   Currently in Pain? No/denies  ADULT SLP TREATMENT - 05/06/21 1300       General Information   Behavior/Cognition Alert;Cooperative      Treatment Provided   Treatment provided Cognitive-Linquistic      Cognitive-Linquistic Treatment   Treatment focused on Cognition;Patient/family/caregiver education    Skilled Treatment Patient reports new alarms on his phone helpful for medication reminders, but, would like for alarm to tell him specifically what he is supposed to do. Assisted pt with generating labels and adjusting settings for med reminders to be spoken alerts. Pt reports using schedule at home "when I remember to look at it." Spouse reports using communication strategies with pt such as making sure they are face to face, and pt has been working to get spouse's attention before speaking. Pt has been working  on a schedule on Excel but did not bring this today. In discussion with pt/spouse, will conclude course of therapy focused on compensations for cognitive deficits. Pt and spouse are interested in pursuing ST targeting dysarthria as pt volume/rate of speech causing intelligibility difficulties at home and in community settings. Pt would like to take a break from ST to finish working with PT, with plans to return for dysarthria evaluation/tx in April.      Assessment / Recommendations / Plan   Plan Continue with current plan of care      Progression Toward Goals   Progression toward goals --   goals partially met, pt ready for d/c             SLP Education - 05/06/21 1305     Education Details options for dysarthria tx, including LSVT vs 2x a week scheduling    Person(s) Educated Patient;Spouse    Methods Explanation    Comprehension Verbalized understanding                SLP Long Term Goals - 05/06/21 1313       SLP LONG TERM GOAL #1   Title Pt will use daily schedule to prepare in advance for scheduled activities/outings with min caregiver cues.    Time 4    Period Weeks    Status Achieved      SLP LONG TERM GOAL #2   Title Pt/spouse will report no missed medications over 1 week period using medication management system/strategies.    Time 4    Period Weeks    Status Not Met      SLP LONG TERM GOAL #3   Title Pt/spouse will report using strategies to maximize communication effectiveness (reduce distractions, face-to-face conversation, chunking of information, repeat back information, etc).    Time 4    Period Weeks    Status Achieved              Plan - 05/06/21 1306     Clinical Impression Statement .[name] presents with overall moderate cognitive communication deficits. Patient has attended 8 speech therapy sessions focusing on compensatory aids and strategies to assist with functional recall and participation in daily activities given cognitive  decline. Patient reports improvement with using alarms on his phone to recall med times, also reports using a daily schedule checklist at home. Pt did not complete Excel schedule (his choosing) but continues to work on this at home. Per discussion with pt and spouse today, will d/c ST at this time, with pt to return for evaluation in ~2 months for dysarthria and course of therapy to improve speech intelligibility. Will request referral from PCP.    Speech Therapy Frequency --  d/c   Duration --   d/c   Treatment/Interventions Environmental controls;Cueing hierarchy;SLP instruction and feedback;Compensatory strategies;Functional tasks;Internal/external aids;Patient/family education    Potential to Achieve Goals Fair    Potential Considerations Ability to learn/carryover information;Severity of impairments;Previous level of function    SLP Home Exercise Plan to be provided next session    Consulted and Agree with Plan of Care Patient;Family member/caregiver    Family Member Consulted Wife             Patient will benefit from skilled therapeutic intervention in order to improve the following deficits and impairments:   Cognitive communication deficit  Parkinson's disease [facility])    Problem List Patient Active Problem List   Diagnosis Date Noted   Eosinophilia 04/27/2021   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

## 2021-05-08 LAB — CALR + JAK2 E12-15 + MPL (REFLEXED)

## 2021-05-08 LAB — JAK2 V617F, W REFLEX TO CALR/E12/MPL

## 2021-05-11 ENCOUNTER — Ambulatory Visit: Payer: Medicare PPO

## 2021-05-11 ENCOUNTER — Other Ambulatory Visit: Payer: Self-pay

## 2021-05-11 ENCOUNTER — Encounter: Payer: Medicare PPO | Admitting: Speech Pathology

## 2021-05-11 DIAGNOSIS — G20 Parkinson's disease: Secondary | ICD-10-CM

## 2021-05-11 DIAGNOSIS — R2689 Other abnormalities of gait and mobility: Secondary | ICD-10-CM

## 2021-05-11 DIAGNOSIS — R2681 Unsteadiness on feet: Secondary | ICD-10-CM

## 2021-05-11 NOTE — Therapy (Signed)
[place] MAIN [facility] SERVICES [address] [place], [place], [address] Phone: [phone]   Fax:  [phone]  Physical Therapy Treatment  Patient Details  Name: Cory [name] MRN: [id number] Date of Birth: 06-25-1955 Referring Provider (PT): Benay Spice   Encounter Date: 05/11/2021   PT End of Session - 05/11/21 1209     Visit Number 56    Number of Visits 21    Date for PT Re-Evaluation 05/20/21    Authorization Type [employer] PPO; 7/10 PN 04/08/20    Authorization Time Period Cert 11/10/48-27/74/12    PT Start Time 1104    PT Stop Time 1146    PT Time Calculation (min) 42 min    Equipment Utilized During Treatment Gait belt    Activity Tolerance Patient tolerated treatment well    Behavior During Therapy WFL for tasks assessed/performed             Past Medical History:  Diagnosis Date   Abnormal ankle brachial index    Aortic atherosclerosis ([place])    Arthritis    hips and back   Asthma    as a baby-    Bronchiectasis without complication ([place])    CKD (chronic kidney disease) stage 67, GFR 30-59 ml/min (HCC)    Coronary artery calcification seen on CT scan    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 [facility])    ED (erectile dysfunction)    Eosinophilia    GERD (gastroesophageal reflux disease)    rare -    History of BPH    History of genital warts    History of solitary pulmonary nodule    Left and right lung nodules on CT in ER 1/20; stable by CT 2/21   Hypertriglyceridemia    Microscopic hematuria    Other chronic pain    Parkinson's disease ([place])    Pneumonia    age 66   Post-traumatic osteoarthritis of left knee    Rash    legs- on going   Seizure-like activity ([place])    Umbilical hernia     Past Surgical History:  Procedure Laterality Date   APPENDECTOMY     BACK SURGERY     posterior lumbar spine fusion L4-5   COLONOSCOPY     COLONOSCOPY WITH PROPOFOL N/A 06/06/2017   Procedure: COLONOSCOPY WITH  PROPOFOL;  Surgeon: Manya Silvas, MD;  Location: [facility] ENDOSCOPY;  Service: Endoscopy;  Laterality: N/A;   KNEE ARTHROSCOPY Bilateral    ACL   KNEE ARTHROSCOPY W/ ACL RECONSTRUCTION     RADIAL KERATOTOMY     ROTATOR CUFF REPAIR Bilateral    SHOULDER ARTHROSCOPY WITH BICEPS TENDON REPAIR Left 04/16/2019   Procedure: SHOULDER ARTHROSCOPY WITH BICEPS TENDON REPAIR, MINI OPEN SUPERIOR CAPSULAR RECONSTRUCTION, BICEPS TENODESIS;  Surgeon: Leim Fabry, MD;  Location: [facility] ORS;  Service: Orthopedics;  Laterality: Left;    There were no vitals filed for this visit.   Subjective Assessment - 05/11/21 1109     Subjective Patient had a rough weekend. His blood pressure was good this morning but was high yesterday afternoon. Had a nosedive in the hall on Thursday or Friday. Hit his head but no concussion.    Patient is accompained by: Family member    Pertinent History Patient admitted to hospital 05/23/20 with code stroke and R sided weakness. Imaging negative for acute changes. PMH includes Parkinson's with autonomic instability, asthma, GERD depression, degenerative spondylolisthesis, focal seizures and migraine syndrome. Patient had three ED  visits for falls since then. Patient reports when he has the seizures he has pain in his temples and bright flashes in his vision. Last week had three falls in one day.    Limitations Lifting;Standing;Walking;House hold activities    How long can you sit comfortably? sleeps in easy chair.    How long can you stand comfortably? feel unsteady once in standing.    How long can you walk comfortably? difficult to walk during "off period"    Patient Stated Goals stability, have a device that will be most helpful, help to become and stay independent.    Currently in Pain? No/denies              Patient had a rough weekend. His blood pressure was good this morning but was high yesterday afternoon.    BP: 156/88     Neuro Re-ed  Airex balance beam lateral  stepping 6x length of // bars Airex balance beam PVC pipe chest press 10x, straight arm raise 10x  Large step over band on floor clap and step back, stepping back is more challenging than stepping forward, cues for decreasing velocity of movement x 12 each LE  Airex pad: reach into bag to grap small velcro ball and then large reach outside BOS to place on target x15; then return each ball to bag    TherEx Forward lunges 4x length of // bars GTB monster walks forward/backwards 4x length of // bars GTB lateral stepping with squat 4x length of // bars 10x STS with arms crossed  Standing heel toe raises 20x      Pt educated throughout session about proper posture and technique with exercises. Improved exercise technique, movement at target joints, use of target muscles after min to mod verbal, visual, tactile cues.   Patient requires frequent cueing for decreasing velocity of movements to improve amplitude and fluidity. Spatial awareness of LLE is limited requiring frequent external cues. amplitude of movement. The pt will continue to benefit from skilled PT to improve coordination, gait, and balance to increase ease and safety with ADLs.                      PT Education - 05/11/21 1110     Education Details exercise technique, body mechanics    Person(s) Educated Patient    Methods Explanation;Demonstration;Tactile cues;Verbal cues    Comprehension Verbalized understanding;Returned demonstration;Verbal cues required;Tactile cues required              PT Short Term Goals - 10/21/20 0903       PT SHORT TERM GOAL #1   Title Patient will be independent in home exercise program to improve strength/mobility for better functional independence with ADLs.    Baseline 6/16: HEP to be given next session 7/19: HEP compliant    Time 4    Period Weeks    Status Achieved    Target Date 10/16/20               PT Long Term Goals - 04/08/21 1213       PT LONG TERM  GOAL #1   Title Patient will increase FOTO score to equal to or greater than 51%    to demonstrate statistically significant improvement in mobility and quality of life.    Baseline 6/16: 48% 7/19: 57% 8/31: 50% 11/23: 53%    Time 12    Period Weeks    Status Achieved      PT LONG  TERM GOAL #2   Title Patient will increase Berg Balance score by > 6 points (34/56)  to demonstrate decreased fall risk during functional activities.    Baseline 6/16: 28/56 7/19: 45/56    Time 12    Period Weeks    Status Achieved      PT LONG TERM GOAL #3   Title Patient will deny any falls over past 4 weeks to demonstrate improved safety awareness at home and work.    Baseline 6/16: multiple falls 7/19: no falls reported 9/7: 3 falls in past month; 2 falls in past month 11/23: 1 fall past month 1/4: at least 6    Time 12    Period Weeks    Status Partially Met    Target Date 05/20/21      PT LONG TERM GOAL #4   Title Patient will increase six minute walk test distance to >1300 for progression to age norm community ambulator and improve gait ability    Baseline 6/16: 945 ft with multiple near falls 7/19: 1495 ft    Time 12    Period Weeks    Status Achieved      PT LONG TERM GOAL #5   Title Patient will increase Berg Balance score by > 6 points (51/56)  to demonstrate decreased fall risk during functional activities.    Baseline 7/19: 45/56 8/31: 49/56; 10/14: 52/56 11/23: 52/56 1/4: 46/56    Time 12    Period Weeks    Status Partially Met    Target Date 05/20/21      PT LONG TERM GOAL #6   Title Patient will increase Functional Gait Assessment score to >20/30 as to reduce fall risk and improve dynamic gait safety with community ambulation.    Baseline 7/19: 16/30 8/31: 20/30;    Time 12    Period Weeks    Status Achieved      PT LONG TERM GOAL #7   Title Patient will increase Functional Gait Assessment score to >26/30 as to reduce fall risk and improve dynamic gait safety with community  ambulation.    Baseline 8/31: 20/30; 10/14: 22/30 11/23: 24/30    Time 12    Period Weeks    Status Partially Met    Target Date 05/20/21      PT LONG TERM GOAL #8   Title Patient will static stand for approximately 5 minutes and perturbations for shooting/teaching classes without loss of balance or requiring object to lean on.    Baseline Unable to perform without leaning on objects; 10/14: Still unable to perform without leaning on objects. 11/23: able to perform 5 minutes    Time 12    Period Weeks    Status Achieved    Target Date 05/20/21      PT LONG TERM GOAL  #9   TITLE Patient will be able to ambulate approximately 1 mile walking dog across changing surfaces such as grass, concrete, and sidewalk without loss of balance.    Baseline 9/7: unable to perform yet; 10/14: Unable 11/23: has not been able to attempt due to recent increase in "off times" 1/4 : unable    Time 12    Period Weeks    Status On-going    Target Date 05/20/21      PT LONG TERM GOAL  #10   TITLE Patient will bend down and pick up items without losing balance to perform iADLs such as emptying dishwasher and feeding dog.    Baseline  9/7: unable to perform; 10/14: Performing at home without falls or LOB.    Time 12    Period Weeks    Status Achieved      PT LONG TERM GOAL  #11   TITLE Patient will increase lower extremity functional scale to >60/80 to demonstrate improved functional mobility and increased tolerance with ADLs.    Baseline 11/23: 44 1/4: 40    Time 12    Period Weeks    Status New    Target Date 05/20/21      PT LONG TERM GOAL  #12   TITLE Patient will static stand and reach into pocket to grab item without LOB for improved stability and mobility.    Baseline 11/23; LOB reaching into pocket; 1/4: able to perform with only one episode of rocking with increased time    Time 12    Period Weeks    Status Partially Met    Target Date 05/20/21                   Plan - 05/11/21  1210     Clinical Impression Statement Patient requires frequent cueing for decreasing velocity of movements to improve amplitude and fluidity. Spatial awareness of LLE is limited requiring frequent external cues. amplitude of movement. The pt will continue to benefit from skilled PT to improve coordination, gait, and balance to increase ease and safety with ADLs.    Personal Factors and Comorbidities Age;Comorbidity 3+;Fitness;Past/Current Experience;Time since onset of injury/illness/exacerbation;Transportation    Comorbidities Parkinson's with autonomic instability, asthma, GERD depression, degenerative spondylolisthesis, focal seizures and migraine syndrome    Examination-Activity Limitations Bathing;Bed Mobility;Bend;Caring for [employer];Locomotion Level;Lift;Hygiene/Grooming;Squat;Stairs;Stand;Toileting;Transfers    Examination-Participation Restrictions Church;Cleaning;Community Activity;Driving;Meal Prep;Medication Management;Laundry;Shop;Volunteer;Yard Work    [employer]chant [employer] officer Evolving/Moderate complexity    Rehab Potential Fair    PT Frequency 2x / week    PT Duration 12 weeks    PT Treatment/Interventions ADLs/Self Care Home Management;Biofeedback;Canalith Repostioning;Cryotherapy;Parrafin;Ultrasound;Traction;Moist Heat;Iontophoresis 2m/ml Dexamethasone;Electrical Stimulation;DME Instruction;Gait training;Stair training;Functional mobility training;Neuromuscular re-education;Balance training;Therapeutic exercise;Therapeutic activities;Patient/family education;Orthotic Fit/Training;Manual techniques;Passive range of motion;Manual lymph drainage;Energy conservation;Splinting;Taping;Vasopneumatic Device;Vestibular;Joint Manipulations;Visual/perceptual remediation/compensation    PT Next Visit Plan Increase cognitive demand/dual tasking during gait, progress thoracic opening and rotaiton exercises, core strengthening, progress dynamic balance and gait  training for increased BOS and dual tasking. Add core strengthening to HEP. Add dynadisc tasks with backwards rotational component.    PT Home Exercise Plan No updates today    Consulted and Agree with Plan of Care Patient    Family Member Consulted wife             Patient will benefit from skilled therapeutic intervention in order to improve the following deficits and impairments:  Abnormal gait, Decreased activity tolerance, Decreased balance, Decreased knowledge of precautions, Decreased endurance, Decreased coordination, Decreased cognition, Decreased knowledge of use of DME, Decreased mobility, Decreased safety awareness, Difficulty walking, Decreased strength, Impaired flexibility, Impaired perceived functional ability, Impaired sensation, Impaired tone, Impaired UE functional use, Impaired vision/preception, Improper body mechanics, Postural dysfunction  Visit Diagnosis: Parkinson's disease ([place]  Other abnormalities of gait and mobility  Unsteadiness on feet     Problem List Patient Active Problem List   Diagnosis Date Noted   Eosinophilia 04/27/2021   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[place] 04/21/2021   Thoracic aortic aneurysm 04/21/2021   Focal seizure ([place] 05/24/2020   [place]inson's disease ([place]    History of migraine headaches    Migraine syndrome    BPH (benign prostatic hyperplasia)  Depression    Degenerative spondylolisthesis 05/07/2016    Janna Arch, PT, DPT  05/11/2021, 12:12 PM  [place] MAIN [facility] SERVICES [address] [place], [place], [address] Phone: [phone]   Fax:  [phone]  Name: Cory [name] MRN: [id number] Date of Birth: [birth year]

## 2021-05-13 ENCOUNTER — Other Ambulatory Visit: Payer: Self-pay

## 2021-05-13 ENCOUNTER — Encounter: Payer: Medicare PPO | Admitting: Speech Pathology

## 2021-05-13 ENCOUNTER — Ambulatory Visit: Payer: Medicare PPO

## 2021-05-13 DIAGNOSIS — R2681 Unsteadiness on feet: Secondary | ICD-10-CM

## 2021-05-13 DIAGNOSIS — G20 Parkinson's disease: Secondary | ICD-10-CM | POA: Diagnosis not present

## 2021-05-13 DIAGNOSIS — R2689 Other abnormalities of gait and mobility: Secondary | ICD-10-CM

## 2021-05-13 NOTE — Therapy (Signed)
[place] [facility]IN [facility] SERVICES [address] [place], [place], [address] Phone: [phone]   Fax:  [phone]  Physical Therapy Treatment  Patient Details  Name: Cory [name] MRN: [id number] Date of Birth: [birth year] Referring Provider (PT): Benay Spice   Encounter Date: 05/13/2021   PT End of Session - 05/13/21 1104     Visit Number 30    Number of Visits 47    Date for PT Re-Evaluation 05/20/21    Authorization Type [employer] PPO; 8/10 PN 04/08/20    Authorization Time Period Cert 12/12/90-11/94/17    PT Start Time 1100    PT Stop Time 1144    PT Time Calculation (min) 44 min    Equipment Utilized During Treatment Gait belt    Activity Tolerance Patient tolerated treatment well    Behavior During Therapy WFL for tasks assessed/performed             Past Medical History:  Diagnosis Date   Abnormal ankle brachial index    Aortic atherosclerosis ([name])    Arthritis    hips and back   Asthma    as a baby-    Bronchiectasis without complication ([place])    CKD (chronic kidney disease) stage 5, GFR 30-59 ml/min (HCC)    Coronary artery calcification seen on CT scan    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 [facility])    ED (erectile dysfunction)    Eosinophilia    GERD (gastroesophageal reflux disease)    rare -    History of BPH    History of genital warts    History of solitary pulmonary nodule    Left and right lung nodules on CT in ER 1/20; stable by CT 2/21   Hypertriglyceridemia    Microscopic hematuria    Other chronic pain    Parkinson's disease ([place])    Pneumonia    age 4   Post-traumatic osteoarthritis of left knee    Rash    legs- on going   Seizure-like activity ([place])    Umbilical hernia     Past Surgical History:  Procedure Laterality Date   APPENDECTOMY     BACK SURGERY     posterior lumbar spine fusion L4-5   COLONOSCOPY     COLONOSCOPY WITH PROPOFOL N/A 06/06/2017   Procedure: COLONOSCOPY WITH  PROPOFOL;  Surgeon: Manya Silvas, MD;  Location: [facility] ENDOSCOPY;  Service: Endoscopy;  Laterality: N/A;   KNEE ARTHROSCOPY Bilateral    ACL   KNEE ARTHROSCOPY W/ ACL RECONSTRUCTION     RADIAL KERATOTOMY     ROTATOR CUFF REPAIR Bilateral    SHOULDER ARTHROSCOPY WITH BICEPS TENDON REPAIR Left 04/16/2019   Procedure: SHOULDER ARTHROSCOPY WITH BICEPS TENDON REPAIR, MINI OPEN SUPERIOR CAPSULAR RECONSTRUCTION, BICEPS TENODESIS;  Surgeon: Leim Fabry, MD;  Location: [facility] ORS;  Service: Orthopedics;  Laterality: Left;    There were no vitals filed for this visit.   Subjective Assessment - 05/13/21 1102     Subjective Patient reports he doesn't remember any falls since last session. Has had a higher BP this morning.    Patient is accompained by: Family member    Pertinent History Patient admitted to hospital 05/23/20 with code stroke and R sided weakness. Imaging negative for acute changes. PMH includes Parkinson's with autonomic instability, asthma, GERD depression, degenerative spondylolisthesis, focal seizures and migraine syndrome. Patient had three ED visits for falls since then. Patient reports when he has the seizures he has pain in  his temples and bright flashes in his vision. Last week had three falls in one day.    Limitations Lifting;Standing;Walking;House hold activities    How long can you sit comfortably? sleeps in easy chair.    How long can you stand comfortably? feel unsteady once in standing.    How long can you walk comfortably? difficult to walk during "off period"    Patient Stated Goals stability, have a device that will be most helpful, help to become and stay independent.    Currently in Pain? No/denies                 BP: 149/84     Neuro Re-ed   ambulate in hallway: -horizontal head turns with cues for reading alphabet from cards 86 ftx 2 sets -horizontal head turns with cues for reading number and symbols from cards 86 ft x 2 sets  -red light/green  light for sudden initiation/termination of ambulation with close CGA for carryover to natural environment 2x 86 ft   Airex pad reaching for objects inside/outside BOS with horizontal head turns with cognitive game x 7 minutes Airex modified tandem stance with horizontal head turns 10x each LE placement   Half foam roller: df/pf 15x    TherEx BTB row 15x BTB straight arm lat pull down 15x  15 squats with UE support on // bar; cue for full stand at top of squat  10x STS with arms crossed  Standing heel toe raises 20x     Standing with # 5lb ankle weight: CGA for stability  -Hip extension with b upper extremity support, cueing for neutral hip alignment, upright posture for optimal muscle recruitment, and sequencing, 10x each LE,  -Hip abduction with b upper extremity support, cueing for neutral foot alignment for correct muscle activation, 10x each LE -Hip flexion with b upper extremity support, cueing for body mechanics, speed of muscle recruitment for optimal strengthening and stabilization 10x each LE -Hamstring curl with b upper extremity support, cueing for knee alignment for recruitment of hamstring musculature, 10x each LE   Seated with # 5 ankle weights  -Seated marches with upright posture, back away from back of chair for abdominal/trunk activation/stabilization, 10x each LE -Seated LAQ with 3 second holds, 10x each LE, cueing for muscle activation and sequencing for neutral alignment -Seated IR/ER with cueing for stabilizing knee placement with lateral foot movement for optimal muscle recruitment, 10x each LE  Pt educated throughout session about proper posture and technique with exercises. Improved exercise technique, movement at target joints, use of target muscles after min to mod verbal, visual, tactile cues.    Next session will be patient's last session with PT. Patient agreeable at this plan. Strengthening and stabilization interventions tolerated well throughout  session. Patient is challenged by 5lb ankle weights. Decreased episodes of LOB with horizontal head turns noted this session indicating carryover between sessions. The pt will continue to benefit from skilled PT to improve coordination, gait, and balance to increase ease and safety with ADLs.                     PT Education - 05/13/21 1103     Education Details exercise technique, body mechanics    Person(s) Educated Patient    Methods Explanation;Demonstration;Tactile cues;Verbal cues    Comprehension Verbalized understanding;Returned demonstration;Verbal cues required;Tactile cues required              PT Short Term Goals - 10/21/20 5916  PT SHORT TERM GOAL #1   Title Patient will be independent in home exercise program to improve strength/mobility for better functional independence with ADLs.    Baseline 6/16: HEP to be given next session 7/19: HEP compliant    Time 4    Period Weeks    Status Achieved    Target Date 10/16/20               PT Long Term Goals - 04/08/21 1213       PT LONG TERM GOAL #1   Title Patient will increase FOTO score to equal to or greater than 51%    to demonstrate statistically significant improvement in mobility and quality of life.    Baseline 6/16: 48% 7/19: 57% 8/31: 50% 11/23: 53%    Time 12    Period Weeks    Status Achieved      PT LONG TERM GOAL #2   Title Patient will increase Berg Balance score by > 6 points (34/56)  to demonstrate decreased fall risk during functional activities.    Baseline 6/16: 28/56 7/19: 45/56    Time 12    Period Weeks    Status Achieved      PT LONG TERM GOAL #3   Title Patient will deny any falls over past 4 weeks to demonstrate improved safety awareness at home and work.    Baseline 6/16: multiple falls 7/19: no falls reported 9/7: 3 falls in past month; 2 falls in past month 11/23: 1 fall past month 1/4: at least 6    Time 12    Period Weeks    Status Partially Met     Target Date 05/20/21      PT LONG TERM GOAL #4   Title Patient will increase six minute walk test distance to >1300 for progression to age norm community ambulator and improve gait ability    Baseline 6/16: 945 ft with multiple near falls 7/19: 1495 ft    Time 12    Period Weeks    Status Achieved      PT LONG TERM GOAL #5   Title Patient will increase Berg Balance score by > 6 points (51/56)  to demonstrate decreased fall risk during functional activities.    Baseline 7/19: 45/56 8/31: 49/56; 10/14: 52/56 11/23: 52/56 1/4: 46/56    Time 12    Period Weeks    Status Partially Met    Target Date 05/20/21      PT LONG TERM GOAL #6   Title Patient will increase Functional Gait Assessment score to >20/30 as to reduce fall risk and improve dynamic gait safety with community ambulation.    Baseline 7/19: 16/30 8/31: 20/30;    Time 12    Period Weeks    Status Achieved      PT LONG TERM GOAL #7   Title Patient will increase Functional Gait Assessment score to >26/30 as to reduce fall risk and improve dynamic gait safety with community ambulation.    Baseline 8/31: 20/30; 10/14: 22/30 11/23: 24/30    Time 12    Period Weeks    Status Partially Met    Target Date 05/20/21      PT LONG TERM GOAL #8   Title Patient will static stand for approximately 5 minutes and perturbations for shooting/teaching classes without loss of balance or requiring object to lean on.    Baseline Unable to perform without leaning on objects; 10/14: Still unable to perform without leaning  on objects. 11/23: able to perform 5 minutes    Time 12    Period Weeks    Status Achieved    Target Date 05/20/21      PT LONG TERM GOAL  #9   TITLE Patient will be able to ambulate approximately 1 mile walking dog across changing surfaces such as grass, concrete, and sidewalk without loss of balance.    Baseline 9/7: unable to perform yet; 10/14: Unable 11/23: has not been able to attempt due to recent increase in "off  times" 1/4 : unable    Time 12    Period Weeks    Status On-going    Target Date 05/20/21      PT LONG TERM GOAL  #10   TITLE Patient will bend down and pick up items without losing balance to perform iADLs such as emptying dishwasher and feeding dog.    Baseline 9/7: unable to perform; 10/14: Performing at home without falls or LOB.    Time 12    Period Weeks    Status Achieved      PT LONG TERM GOAL  #11   TITLE Patient will increase lower extremity functional scale to >60/80 to demonstrate improved functional mobility and increased tolerance with ADLs.    Baseline 11/23: 44 1/4: 40    Time 12    Period Weeks    Status New    Target Date 05/20/21      PT LONG TERM GOAL  #12   TITLE Patient will static stand and reach into pocket to grab item without LOB for improved stability and mobility.    Baseline 11/23; LOB reaching into pocket; 1/4: able to perform with only one episode of rocking with increased time    Time 12    Period Weeks    Status Partially Met    Target Date 05/20/21                   Plan - 05/13/21 1230     Clinical Impression Statement Next session will be patient's last session with PT. Patient agreeable at this plan. Strengthening and stabilization interventions tolerated well throughout session. Patient is challenged by 5lb ankle weights. Decreased episodes of LOB with horizontal head turns noted this session indicating carryover between sessions. The pt will continue to benefit from skilled PT to improve coordination, gait, and balance to increase ease and safety with ADLs.    Personal Factors and Comorbidities Age;Comorbidity 3+;Fitness;Past/Current Experience;Time since onset of injury/illness/exacerbation;Transportation    Comorbidities Parkinson's with autonomic instability, asthma, GERD depression, degenerative spondylolisthesis, focal seizures and migraine syndrome    Examination-Activity Limitations Bathing;Bed Mobility;Bend;Caring for  [employer];Locomotion Level;Lift;Hygiene/Grooming;Squat;Stairs;Stand;Toileting;Transfers    Examination-Participation Restrictions Church;Cleaning;Community Activity;Driving;Meal Prep;Medication Management;Laundry;Shop;Volunteer;Yard Work    [employer]chant [employer] Evolving/Moderate complexity    Rehab Potential Fair    PT Frequency 2x / week    PT Duration 12 weeks    PT Treatment/Interventions ADLs/Self Care Home Management;Biofeedback;Canalith Repostioning;Cryotherapy;Parrafin;Ultrasound;Traction;Moist Heat;Iontophoresis 1m/ml Dexamethasone;Electrical Stimulation;DME Instruction;Gait training;Stair training;Functional mobility training;Neuromuscular re-education;Balance training;Therapeutic exercise;Therapeutic activities;Patient/family education;Orthotic Fit/Training;Manual techniques;Passive range of motion;Manual lymph drainage;Energy conservation;Splinting;Taping;Vasopneumatic Device;Vestibular;Joint Manipulations;Visual/perceptual remediation/compensation    PT Next Visit Plan discharge    PT Home Exercise Plan No updates today    Consulted and Agree with Plan of Care Patient    Family Member Consulted wife             Patient will benefit from skilled therapeutic intervention in order to improve the following deficits and impairments:  Abnormal  gait, Decreased activity tolerance, Decreased balance, Decreased knowledge of precautions, Decreased endurance, Decreased coordination, Decreased cognition, Decreased knowledge of use of DME, Decreased mobility, Decreased safety awareness, Difficulty walking, Decreased strength, Impaired flexibility, Impaired perceived functional ability, Impaired sensation, Impaired tone, Impaired UE functional use, Impaired vision/preception, Improper body mechanics, Postural dysfunction  Visit Diagnosis: Parkinson's disease ([place])  Other abnormalities of gait and mobility  Unsteadiness on feet     Problem  List Patient Active Problem List   Diagnosis Date Noted   Eosinophilia 04/27/2021   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

## 2021-05-14 DIAGNOSIS — R0989 Other specified symptoms and signs involving the circulatory and respiratory systems: Secondary | ICD-10-CM | POA: Insufficient documentation

## 2021-05-18 ENCOUNTER — Ambulatory Visit: Payer: Medicare PPO

## 2021-05-18 ENCOUNTER — Encounter: Payer: Medicare PPO | Admitting: Speech Pathology

## 2021-05-18 ENCOUNTER — Other Ambulatory Visit: Payer: Self-pay

## 2021-05-18 DIAGNOSIS — G20 Parkinson's disease: Secondary | ICD-10-CM

## 2021-05-18 DIAGNOSIS — R2681 Unsteadiness on feet: Secondary | ICD-10-CM

## 2021-05-18 DIAGNOSIS — R2689 Other abnormalities of gait and mobility: Secondary | ICD-10-CM

## 2021-05-18 NOTE — Therapy (Signed)
[place] [facility] SERVICES [address] [place], [place], [address] Phone: [phone]   Fax:  [phone]  Physical Therapy Treatment/Discharge  Patient Details  Name: Cory [name] MRN: [id number] Date of Birth: July 16, 1955 Referring Provider (PT): Cory [name]   Encounter Date: 05/18/2021   PT End of Session - 05/18/21 1121     Visit Number 50    Number of Visits 73    Date for PT Re-Evaluation 05/20/21    Authorization Type [employer] PPO; 9/10 PN 04/08/20    Authorization Time Period Cert 0/8/14-48/18/56    PT Start Time 1104    PT Stop Time 1145    PT Time Calculation (min) 41 min    Equipment Utilized During Treatment Gait belt    Activity Tolerance Patient tolerated treatment well    Behavior During Therapy WFL for tasks assessed/performed             Past Medical History:  Diagnosis Date   Abnormal ankle brachial index    Aortic atherosclerosis ([place])    Arthritis    hips and back   Asthma    as a baby-    Bronchiectasis without complication ([place])    CKD (chronic kidney disease) stage 47, GFR 30-59 ml/min (HCC)    Coronary artery calcification seen on CT scan    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 [facility])    ED (erectile dysfunction)    Eosinophilia    GERD (gastroesophageal reflux disease)    rare -    History of BPH    History of genital warts    History of solitary pulmonary nodule    Left and right lung nodules on CT in ER 1/20; stable by CT 2/21   Hypertriglyceridemia    Microscopic hematuria    Other chronic pain    Parkinson's disease ([place])    Pneumonia    age 46   Post-traumatic osteoarthritis of left knee    Rash    legs- on going   Seizure-like activity ([place])    Umbilical hernia     Past Surgical History:  Procedure Laterality Date   APPENDECTOMY     BACK SURGERY     posterior lumbar spine fusion L4-5   COLONOSCOPY     COLONOSCOPY WITH PROPOFOL N/A 06/06/2017   Procedure:  COLONOSCOPY WITH PROPOFOL;  Surgeon: Cory Silvas, MD;  Location: [facility] ENDOSCOPY;  Service: Endoscopy;  Laterality: N/A;   KNEE ARTHROSCOPY Bilateral    ACL   KNEE ARTHROSCOPY W/ ACL RECONSTRUCTION     RADIAL KERATOTOMY     ROTATOR CUFF REPAIR Bilateral    SHOULDER ARTHROSCOPY WITH BICEPS TENDON REPAIR Left 04/16/2019   Procedure: SHOULDER ARTHROSCOPY WITH BICEPS TENDON REPAIR, MINI OPEN SUPERIOR CAPSULAR RECONSTRUCTION, BICEPS TENODESIS;  Surgeon: Cory Fabry, MD;  Location: [facility] ORS;  Service: Orthopedics;  Laterality: Left;    There were no vitals filed for this visit.   Subjective Assessment - 05/18/21 1120     Subjective Patient wants today to be his last session.    Patient is accompained by: Family member    Pertinent History Patient admitted to hospital 05/23/20 with code stroke and R sided weakness. Imaging negative for acute changes. PMH includes Parkinson's with autonomic instability, asthma, GERD depression, degenerative spondylolisthesis, focal seizures and migraine syndrome. Patient had three ED visits for falls since then. Patient reports when he has the seizures he has pain in his temples and bright flashes in his vision. Last  week had three falls in one day.    Limitations Lifting;Standing;Walking;House hold activities    How long can you sit comfortably? sleeps in easy chair.    How long can you stand comfortably? feel unsteady once in standing.    How long can you walk comfortably? difficult to walk during "off period"    Patient Stated Goals stability, have a device that will be most helpful, help to become and stay independent.    Currently in Pain? No/denies                [facility] PT Assessment - 05/18/21 0001       Standardized Balance Assessment   Standardized Balance Assessment Berg Balance Test      Berg Balance Test   Sit to Stand Able to stand without using hands and stabilize independently    Standing Unsupported Able to stand safely 2 minutes     Sitting with Back Unsupported but Feet Supported on Floor or Stool Able to sit safely and securely 2 minutes    Stand to Sit Sits safely with minimal use of hands    Transfers Able to transfer safely, minor use of hands    Standing Unsupported with Eyes Closed Able to stand 10 seconds safely    Standing Unsupported with Feet Together Able to place feet together independently and stand for 1 minute with supervision    From Standing, Reach Forward with Outstretched Arm Can reach confidently >25 cm (10")    From Standing Position, Pick up Object from Floor Able to pick up shoe, needs supervision    From Standing Position, Turn to Look Behind Over each Shoulder Looks behind from both sides and weight shifts well    Turn 360 Degrees Able to turn 360 degrees safely one side only in 4 seconds or less    Standing Unsupported, Alternately Place Feet on Step/Stool Able to stand independently and complete 8 steps >20 seconds    Standing Unsupported, One Foot in Front Able to take small step independently and hold 30 seconds    Standing on One Leg Able to lift leg independently and hold 5-10 seconds    Total Score 49      Functional Gait  Assessment   Gait assessed  Yes    Gait Level Surface Walks 20 ft in less than 5.5 sec, no assistive devices, good speed, no evidence for imbalance, normal gait pattern, deviates no more than 6 in outside of the 12 in walkway width.    Change in Gait Speed Able to smoothly change walking speed without loss of balance or gait deviation. Deviate no more than 6 in outside of the 12 in walkway width.    Gait with Horizontal Head Turns Performs head turns smoothly with no change in gait. Deviates no more than 6 in outside 12 in walkway width    Gait with Vertical Head Turns Performs task with slight change in gait velocity (eg, minor disruption to smooth gait path), deviates 6 - 10 in outside 12 in walkway width or uses assistive device    Gait and Pivot Turn Pivot turns safely  within 3 sec and stops quickly with no loss of balance.    Step Over Obstacle Is able to step over 2 stacked shoe boxes taped together (9 in total height) without changing gait speed. No evidence of imbalance.    Gait with Narrow Base of Support Ambulates 4-7 steps.    Gait with Eyes Closed Walks 20 ft,  no assistive devices, good speed, no evidence of imbalance, normal gait pattern, deviates no more than 6 in outside 12 in walkway width. Ambulates 20 ft in less than 7 sec.    Ambulating Backwards Walks 20 ft, no assistive devices, good speed, no evidence for imbalance, normal gait    Steps Alternating feet, must use rail.    Total Score 26                Discharge Falls over 4 months: averaging a fall every other week.  BERG: 49/56  FGA: 26 Ambulate 1 mile with dog LEFS: 44 Reach into pocket and grab item without LOB: able to perform    Foto: 56%  Treat: Education on sit to stand from rocking chair with different hand placements.   Ambulation with RW across stable and unstable surface outside. Negotiating changing surfaces from grass to sidewalk, across brick with turns and obstacles in pathway without LOB.   Patient is ready for discharge at this time. He is aware today is last session as he requests discharge at this time. He has been enrolled into silver sneakers and educated on need for continuation of exercise. I will be happy to see this patient again in the future as needed.               PT Education - 05/18/21 1120     Education Details goals, discharge    Person(s) Educated Patient;Spouse    Methods Explanation;Demonstration;Verbal cues;Tactile cues    Comprehension Verbalized understanding;Returned demonstration;Verbal cues required;Tactile cues required              PT Short Term Goals - 10/21/20 0903       PT SHORT TERM GOAL #1   Title Patient will be independent in home exercise program to improve strength/mobility for better functional  independence with ADLs.    Baseline 6/16: HEP to be given next session 7/19: HEP compliant    Time 4    Period Weeks    Status Achieved    Target Date 10/16/20               PT Long Term Goals - 05/18/21 1220       PT LONG TERM GOAL #1   Title Patient will increase FOTO score to equal to or greater than 51%    to demonstrate statistically significant improvement in mobility and quality of life.    Baseline 6/16: 48% 7/19: 57% 8/31: 50% 11/23: 53%    Time 12    Period Weeks    Status Achieved      PT LONG TERM GOAL #2   Title Patient will increase Berg Balance score by > 6 points (34/56)  to demonstrate decreased fall risk during functional activities.    Baseline 6/16: 28/56 7/19: 45/56    Time 12    Period Weeks    Status Achieved      PT LONG TERM GOAL #3   Title Patient will deny any falls over past 4 weeks to demonstrate improved safety awareness at home and work.    Baseline 6/16: multiple falls 7/19: no falls reported 9/7: 3 falls in past month; 2 falls in past month 11/23: 1 fall past month 1/4: at least 6 2/13: a fall every other week    Time 12    Period Weeks    Status Partially Met    Target Date 05/20/21      PT LONG TERM GOAL #4   Title  Patient will increase six minute walk test distance to >1300 for progression to age norm community ambulator and improve gait ability    Baseline 6/16: 945 ft with multiple near falls 7/19: 1495 ft    Time 12    Period Weeks    Status Achieved      PT LONG TERM GOAL #5   Title Patient will increase Berg Balance score by > 6 points (51/56)  to demonstrate decreased fall risk during functional activities.    Baseline 7/19: 45/56 8/31: 49/56; 10/14: 52/56 11/23: 52/56 1/4: 46/56 2/13: 49/56    Time 12    Period Weeks    Status Partially Met    Target Date 05/20/21      PT LONG TERM GOAL #6   Title Patient will increase Functional Gait Assessment score to >20/30 as to reduce fall risk and improve dynamic gait safety with  community ambulation.    Baseline 7/19: 16/30 8/31: 20/30;    Time 12    Period Weeks    Status Achieved      PT LONG TERM GOAL #7   Title Patient will increase Functional Gait Assessment score to >26/30 as to reduce fall risk and improve dynamic gait safety with community ambulation.    Baseline 8/31: 20/30; 10/14: 22/30 11/23: 24/30 2/13: 26    Time 12    Period Weeks    Status Achieved    Target Date 05/20/21      PT LONG TERM GOAL #8   Title Patient will static stand for approximately 5 minutes and perturbations for shooting/teaching classes without loss of balance or requiring object to lean on.    Baseline Unable to perform without leaning on objects; 10/14: Still unable to perform without leaning on objects. 11/23: able to perform 5 minutes    Time 12    Period Weeks    Status Achieved    Target Date 05/20/21      PT LONG TERM GOAL  #9   TITLE Patient will be able to ambulate approximately 1 mile walking dog across changing surfaces such as grass, concrete, and sidewalk without loss of balance.    Baseline 9/7: unable to perform yet; 10/14: Unable 11/23: has not been able to attempt due to recent increase in "off times" 1/4 : unable 2/13 unable    Time 12    Period Weeks    Status On-going    Target Date 05/20/21      PT LONG TERM GOAL  #10   TITLE Patient will bend down and pick up items without losing balance to perform iADLs such as emptying dishwasher and feeding dog.    Baseline 9/7: unable to perform; 10/14: Performing at home without falls or LOB.    Time 12    Period Weeks    Status Achieved      PT LONG TERM GOAL  #11   TITLE Patient will increase lower extremity functional scale to >60/80 to demonstrate improved functional mobility and increased tolerance with ADLs.    Baseline 11/23: 44 1/4: 40 2/13:44    Time 12    Period Weeks    Status New    Target Date 05/20/21      PT LONG TERM GOAL  #12   TITLE Patient will static stand and reach into pocket to  grab item without LOB for improved stability and mobility.    Baseline 11/23; LOB reaching into pocket; 1/4: able to perform with only one episode of  rocking with increased time  2/13 able to perform    Time 12    Period Weeks    Status Achieved    Target Date 05/20/21                   Plan - 05/18/21 1217     Clinical Impression Statement Patient is ready for discharge at this time. He is aware today is last session as he requests discharge at this time. He has been enrolled into silver sneakers and educated on need for continuation of exercise. I will be happy to see this patient again in the future as needed.    Personal Factors and Comorbidities Age;Comorbidity 3+;Fitness;Past/Current Experience;Time since onset of injury/illness/exacerbation;Transportation    Comorbidities Parkinson's with autonomic instability, asthma, GERD depression, degenerative spondylolisthesis, focal seizures and migraine syndrome    Examination-Activity Limitations Bathing;Bed Mobility;Bend;Caring for [employer];Locomotion Level;Lift;Hygiene/Grooming;Squat;Stairs;Stand;Toileting;Transfers    Examination-Participation Restrictions Church;Cleaning;Community Activity;Driving;Meal Prep;Medication Management;Laundry;Shop;Volunteer;Yard Work    [employer]chant [employer] Evolving/Moderate complexity    Rehab Potential Fair    PT Frequency 2x / week    PT Duration 12 weeks    PT Treatment/Interventions ADLs/Self Care Home Management;Biofeedback;Canalith Repostioning;Cryotherapy;Parrafin;Ultrasound;Traction;Moist Heat;Iontophoresis 55m/ml Dexamethasone;Electrical Stimulation;DME Instruction;Gait training;Stair training;Functional mobility training;Neuromuscular re-education;Balance training;Therapeutic exercise;Therapeutic activities;Patient/family education;Orthotic Fit/Training;Manual techniques;Passive range of motion;Manual lymph drainage;Energy  conservation;Splinting;Taping;Vasopneumatic Device;Vestibular;Joint Manipulations;Visual/perceptual remediation/compensation    PT Next Visit Plan discharge    PT Home Exercise Plan No updates today    Consulted and Agree with Plan of Care Patient    Family Member Consulted wife             Patient will benefit from skilled therapeutic intervention in order to improve the following deficits and impairments:  Abnormal gait, Decreased activity tolerance, Decreased balance, Decreased knowledge of precautions, Decreased endurance, Decreased coordination, Decreased cognition, Decreased knowledge of use of DME, Decreased mobility, Decreased safety awareness, Difficulty walking, Decreased strength, Impaired flexibility, Impaired perceived functional ability, Impaired sensation, Impaired tone, Impaired UE functional use, Impaired vision/preception, Improper body mechanics, Postural dysfunction  Visit Diagnosis: Parkinson's disease ([place]  Other abnormalities of gait and mobility  Unsteadiness on feet     Problem List Patient Active Problem List   Diagnosis Date Noted   Eosinophilia 04/27/2021   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[place] 04/21/2021   Thoracic aortic aneurysm 04/21/2021   Focal [place] ([place] 05/24/2020   [place]'s [place] ([place]    History of migraine headaches    Migraine syndrome    BPH (benign prostatic hyperplasia)    Depression    Degenerative spondylolisthesis 05/07/2016    MJanna Arch PT, DPT  05/18/2021, 12:23 PM  [place]ory [name] [id number]: [phone]  Fax:  [phone] Name: Cory VASTINEMRN: [id number] of Birth: [birth year]

## 2021-05-20 ENCOUNTER — Encounter: Payer: Medicare PPO | Admitting: Speech Pathology

## 2021-05-20 ENCOUNTER — Ambulatory Visit: Payer: Medicare PPO

## 2021-05-25 ENCOUNTER — Encounter: Payer: Medicare PPO | Admitting: Speech Pathology

## 2021-05-25 ENCOUNTER — Ambulatory Visit: Payer: Medicare PPO

## 2021-05-26 ENCOUNTER — Other Ambulatory Visit: Payer: Self-pay

## 2021-05-26 ENCOUNTER — Inpatient Hospital Stay: Payer: Medicare PPO | Attending: Oncology | Admitting: Oncology

## 2021-05-26 ENCOUNTER — Inpatient Hospital Stay: Payer: Medicare PPO

## 2021-05-26 ENCOUNTER — Encounter: Payer: Self-pay | Admitting: Oncology

## 2021-05-26 VITALS — BP 179/90 | HR 80 | Temp 97.5°F | Wt 159.5 lb

## 2021-05-26 DIAGNOSIS — D7219 Other eosinophilia: Secondary | ICD-10-CM

## 2021-05-26 DIAGNOSIS — R768 Other specified abnormal immunological findings in serum: Secondary | ICD-10-CM

## 2021-05-26 DIAGNOSIS — R4189 Other symptoms and signs involving cognitive functions and awareness: Secondary | ICD-10-CM | POA: Diagnosis not present

## 2021-05-26 DIAGNOSIS — Z7951 Long term (current) use of inhaled steroids: Secondary | ICD-10-CM | POA: Insufficient documentation

## 2021-05-26 DIAGNOSIS — I951 Orthostatic hypotension: Secondary | ICD-10-CM | POA: Diagnosis not present

## 2021-05-26 DIAGNOSIS — Z803 Family history of malignant neoplasm of breast: Secondary | ICD-10-CM | POA: Insufficient documentation

## 2021-05-26 DIAGNOSIS — K769 Liver disease, unspecified: Secondary | ICD-10-CM

## 2021-05-26 DIAGNOSIS — F17210 Nicotine dependence, cigarettes, uncomplicated: Secondary | ICD-10-CM | POA: Diagnosis not present

## 2021-05-26 DIAGNOSIS — R932 Abnormal findings on diagnostic imaging of liver and biliary tract: Secondary | ICD-10-CM | POA: Insufficient documentation

## 2021-05-26 DIAGNOSIS — R769 Abnormal immunological finding in serum, unspecified: Secondary | ICD-10-CM | POA: Diagnosis not present

## 2021-05-26 DIAGNOSIS — J45909 Unspecified asthma, uncomplicated: Secondary | ICD-10-CM | POA: Diagnosis not present

## 2021-05-26 DIAGNOSIS — Z801 Family history of malignant neoplasm of trachea, bronchus and lung: Secondary | ICD-10-CM | POA: Insufficient documentation

## 2021-05-26 DIAGNOSIS — Z79899 Other long term (current) drug therapy: Secondary | ICD-10-CM | POA: Diagnosis not present

## 2021-05-26 DIAGNOSIS — D721 Eosinophilia, unspecified: Secondary | ICD-10-CM | POA: Insufficient documentation

## 2021-05-26 DIAGNOSIS — G20 Parkinson's disease: Secondary | ICD-10-CM | POA: Insufficient documentation

## 2021-05-26 DIAGNOSIS — Z720 Tobacco use: Secondary | ICD-10-CM | POA: Insufficient documentation

## 2021-05-26 LAB — C-REACTIVE PROTEIN: CRP: 0.6 mg/dL (ref <1.0)

## 2021-05-26 LAB — SEDIMENTATION RATE: Sed Rate: 40 mm/hr — ABNORMAL HIGH (ref 0–20)

## 2021-05-26 NOTE — Progress Notes (Addendum)
Hematology/Oncology Consult note [phone] [phone]      Patient Care Team: Adin Hector, MD as PCP - General (Internal Medicine) Earlie Server, MD as Consulting Physician (Hematology and Oncology) Lucky Cowboy Erskine Squibb, MD as Consulting Physician (Vascular Surgery) Vladimir Crofts, MD as Consulting Physician (Neurology) Darl Pikes as Referring Physician (Physician Assistant)  REFERRING PROVIDER: Adin Hector, MD  CHIEF COMPLAINTS/REASON FOR VISIT:  Follow up for eosinophilia  HISTORY OF PRESENTING ILLNESS:   Cory [name] is a  66 y.o.  male with PMH listed below was seen in consultation at the request of  Adin Hector, MD  for evaluation of eosinophilia  Patient has a history of chronic eosinophilia.  Previous lab results were reviewed. Eosinophilia dated back to January 2021, progressively getting worse. He also has chronic basophilia. Patient has a history of asthma for which he follows up with pulmonology.  He reports that symptoms are well controlled.  He rarely has to use inhalers.  Denies wheezing, shortness of breath. Patient has Parkinson's disease, on carbidopa/levodopa, amantadine, Comptan  patient has symptoms of dyskinesia, balancing, cognitive impairment.  He is on /memantine for cognitive impairment. He also has episodes of orthostatic hypotension.  Has been on midodrine 2.5 mg twice daily. 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 He reports appetite has been good.  Weight has been stable.  No fever or chills, night sweats, skin rash.  Patient smokes 1 to 2 cigarettes every 2 weeks  04/15/2021, CT angiogram chest abdomen pelvis 1. 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 No other aortic aneurysm is seen.There is mild aortic atherosclerosis in the arch segment with mixed plaque, and moderate abdominal aortic mixed plaque. 2. In the infrarenal aorta, a penetrating atherosclerotic  ulcer along the left anterolateral aortic wall has developed since the 2020 noncontrast CT, measuring 7.4 mm at the base, with the ulcer itself measuring 9.5 by 3.3 x 10.5 mm. There is no dissection, and no further penetrating ulcers. 3. Equivocal stranding adjacent the proximal third of the sigmoid colon which could be early acute diverticulitis or fat scarring from a previous episode 4. Imaging findings of gastroenteritis 5. Ill-defined area of enhancement in the posterior liver dome measuring 2.1 x 1.9 x 1.5 cm   05/24/2020 Echo LVEF 55-60%.   INTERVAL HISTORY Cory [name] is a 66 y.o. male who has above history reviewed by me today presents for follow up visit for eosinophil work-up. Patient has had blood work done and present to review results No new complaints. Currently on lamotrigine since March 2022.  Review of Systems  Constitutional:  Negative for appetite change, chills, fatigue, fever and unexpected weight change.  HENT:   Negative for hearing loss and voice change.   Eyes:  Negative for eye problems and icterus.  Respiratory:  Negative for chest tightness, cough and shortness of breath.   Cardiovascular:  Negative for chest pain and leg swelling.  Gastrointestinal:  Negative for abdominal distention and abdominal pain.  Endocrine: Negative for hot flashes.  Genitourinary:  Negative for difficulty urinating, dysuria and frequency.   Musculoskeletal:  Positive for gait problem. Negative for arthralgias.  Skin:  Negative for itching and rash.  Neurological:  Positive for gait problem, light-headedness and seizures. Negative for numbness.  Hematological:  Negative for adenopathy. Does not bruise/bleed easily.  Psychiatric/Behavioral:  Negative for confusion.    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 [facility])    ED (erectile dysfunction)    Eosinophilia    GERD (gastroesophageal reflux disease)    rare -    History of BPH    History of genital warts    History of solitary pulmonary nodule    Left and right lung nodules on CT in ER 1/20; stable by CT 2/21   Hypertriglyceridemia    Microscopic hematuria    Other chronic pain    Parkinson's disease ([place])    Pneumonia    age 29   Post-traumatic osteoarthritis of left knee    Rash    legs- on going   Seizure-like activity ([place])    Umbilical hernia     SURGICAL HISTORY: Past Surgical History:  Procedure Laterality Date   APPENDECTOMY     BACK SURGERY     posterior lumbar spine fusion L4-5   COLONOSCOPY     COLONOSCOPY WITH PROPOFOL N/A 06/06/2017   Procedure: COLONOSCOPY WITH PROPOFOL;  Surgeon: Manya Silvas, MD;  Location: [facility] ENDOSCOPY;  Service: Endoscopy;  Laterality: N/A;   KNEE ARTHROSCOPY Bilateral    ACL   KNEE ARTHROSCOPY W/ ACL RECONSTRUCTION     RADIAL KERATOTOMY     ROTATOR CUFF REPAIR Bilateral    SHOULDER ARTHROSCOPY WITH BICEPS TENDON REPAIR Left 04/16/2019   Procedure: SHOULDER ARTHROSCOPY WITH BICEPS TENDON REPAIR, MINI OPEN SUPERIOR CAPSULAR RECONSTRUCTION, BICEPS TENODESIS;  Surgeon: Leim Fabry, MD;  Location: [facility] ORS;  Service: Orthopedics;  Laterality: Left;    SOCIAL HISTORY: Social History   Socioeconomic History   Marital status: Married    Spouse name: Not on file   Number of children: Not on file   Years of education: Not on file   Highest education level: Not on file  Occupational History   Not on file  Tobacco Use   Smoking status: Some Days    Packs/day: 0.25    Years: 23.00    Pack years: 5.75    Types: Cigarettes, Cigars   Smokeless tobacco: Never   Tobacco comments:    smokes 1 cigar per day 04/30/2016   Vaping Use   Vaping Use: Never used  Substance and Sexual Activity   Alcohol use: Not Currently    Alcohol/week: 1.0 standard drink    Types: 1 Shots of liquor per week    Comment: has not had in 2 years   Drug use: No   Sexual activity: Not on file  Other Topics Concern   Not on file  Social History Narrative   Not on file   Social Determinants of Health   Financial Resource Strain: Not on file  Food Insecurity: Not on file  Transportation Needs: Not on file  Physical Activity: Not on file  Stress: Not on file  Social Connections: Not on file  Intimate Partner Violence: Not on file    FAMILY HISTORY: Family History  Problem Relation Age of Onset   Hypertension Mother    Breast cancer Mother    Lung cancer Father    Breast cancer Sister     ALLERGIES:  has No Known Allergies.  MEDICATIONS:  Current Outpatient Medications  Medication Sig Dispense Refill   Amantadine HCl 100 MG tablet Take 100 mg by mouth 3 (three) times daily.     aspirin EC 81 MG tablet Take 81 mg by mouth daily. Swallow whole.     azelastine (ASTELIN) 0.1 % nasal spray Place 1-2 sprays into both nostrils as directed.     carbidopa-levodopa (SINEMET CR) 50-200 MG tablet Take 1 tablet by mouth at bedtime.     carbidopa-levodopa (SINEMET IR) 25-100 MG tablet Take 2 tablets by mouth 3 (three) times daily.      cyanocobalamin 1000 MCG tablet Take 1 tablet (1,000 mcg total) by mouth daily. 30 tablet 0   entacapone (COMTAN) 200 MG tablet Take 200 mg by mouth 3 (three) times daily.     Fluticasone-Umeclidin-Vilant (TRELEGY ELLIPTA) 100-62.5-25 MCG/INH AEPB Inhale 1 puff into the lungs daily as needed.     lamoTRIgine (LAMICTAL) 100 MG tablet Take 100 mg by mouth daily.     midodrine (PROAMATINE) 2.5 MG tablet Take 2.5-5 mg by mouth See admin instructions. Take 443m at breakfast (as needed), 2.582mat lunch (as needed) and take 2.43m13mt supper (as needed) for low blood pressure     Multiple Vitamin  (MULTIVITAMIN WITH MINERALS) TABS tablet Take 1 tablet by mouth daily.     MYRBETRIQ 50 MG TB24 tablet Take 50 mg by mouth at bedtime.     sertraline (ZOLOFT) 50 MG tablet Take 50 mg by mouth daily.     tamsulosin (FLOMAX) 0.4 MG CAPS capsule Take 1 capsule (0.4 mg total) by mouth daily after supper. 10 capsule 0   tiZANidine (ZANAFLEX) 4 MG tablet Take 4 mg by mouth daily as needed for muscle spasms.      fludrocortisone (FLORINEF) 0.1 MG tablet Take 100 mcg by mouth every evening. (Patient not taking: Reported on 04/27/2021)     memantine (NAMENDA) 5 MG tablet Take 5 mg by mouth 2 (two) times daily. (Patient not taking: Reported on 04/27/2021)     No current facility-administered medications for this visit.     PHYSICAL EXAMINATION: ECOG PERFORMANCE STATUS: 1 - Symptomatic but completely ambulatory Vitals:   05/26/21 1404  BP: (!) 179/90  Pulse: 80  Temp: (!) 97.5 F (36.4 C)   Filed Weights   05/26/21 1404  Weight: 159 lb 8 oz (72.3 kg)    Physical Exam Constitutional:      General: He is not in acute distress.    Comments: Ambulates with walker.   HENT:     Head: Normocephalic and atraumatic.  Eyes:     General: No scleral icterus. Cardiovascular:     Rate and Rhythm: Normal rate.  Pulmonary:     Effort: Pulmonary effort is normal. No respiratory distress.  Abdominal:     General: There is no distension.  Musculoskeletal:        General: No deformity. Normal range of motion.     Cervical back: Normal range of motion and neck supple.     Comments: cogwheel rigidity  Skin:    Findings: No erythema or rash.  Neurological:     Mental Status: He is alert and oriented to person, place, and time.  Psychiatric:        Mood and Affect: Mood normal.    LABORATORY DATA:  I have reviewed the data as listed Lab Results  Component Value Date   WBC 13.3 (H) 04/27/2021   HGB 14.4 04/27/2021  HCT 44.2 04/27/2021   MCV 87.5 04/27/2021   PLT 260 04/27/2021   Recent Labs     09/15/20 1615 04/15/21 1308 04/27/21 1037  NA 137 136 139  K 3.9 3.2* 3.1*  CL 103 101 98  CO2 26 27 30   GLUCOSE 126* 88 84  BUN 17 19 15   CREATININE 1.25* 1.18 1.25*  CALCIUM 9.3 8.9 8.8*  GFRNONAA >60 >60 >60  PROT 7.7  --  8.8*  ALBUMIN 4.2  --  4.0  AST 16  --  13*  ALT <5  --  <5  ALKPHOS 88  --  107  BILITOT 0.7  --  0.5    Iron/TIBC/Ferritin/ %Sat No results found for: IRON, TIBC, FERRITIN, IRONPCTSAT    RADIOGRAPHIC STUDIES: I have personally reviewed the radiological images as listed and agreed with the findings in the report. No results found.    ASSESSMENT & PLAN:  1. Other eosinophilia   2. Elevated serum immunoglobulin free light chain level   3. Liver lesion    #Chronic eosinophilia, progressively worsening Etiology unknown.  Could be secondary to acute/chronic inflammation, smoking, drug-induced, or bone marrow malignancies. Labs reviewed and discussed with patient Patient has normal vitamin B12, tryptase level, negative BCR ABL1 FISH, JAK2 V617F mutation negative, with reflex to other mutations CALR, MPL, JAK 2 Ex 12-15 mutations negative.  Flow cytometry of the peripheral blood showed no immunophenotypic abnormality.  No loss of aberrant expression of pan T-cell antigen to suggest a neoplastic T-cell process.  Negative EBV IgM, positive for EBV IgG. SPEP showed no monoclonal protein.  He has polyclonal increase of immunoglobulins indicating inflammation MPN/hypereosinophilia FISH negative for PDGFRA, PD GFRB, FGFR 1 less likely MPN-HES variant.  Parkinson's disease, he was on Azilect-discontinued in January 2022.Developed seizure in March 2022 and was started on lamotrigine.Drug-induced eosinophilia could be one of the possibility.  Both lamotrigine and Azilect  cause eosinophilia.  Recent CT scan did not show adenopathy.  CT showed GI inflammation.  Patient is asymptomatic.  Consider repeat colonoscopy to rule out organ involvement.  Liver lesion  on recent CT scan.  Check MRI abdomen with and without contrast for further evaluation.- MRI showed either mild local vascular shunting or benign focal nodular hyperplasia. No specific worrisome lesion is identified Check AFP, hepatitis panel, UPEP, ANA, ESR, EBV QT PCR, CRP. - normal AFP, negative hepatitis, ANA, negative EBV  ESR is elevated, likely due to chronic inflammation.  I recommend GI work up.   Orders Placed This Encounter  Procedures   MR Abdomen W Wo Contrast    Standing Status:   Future    Standing Expiration Date:   05/26/2022    Order Specific Question:   If indicated for the ordered procedure, I authorize the administration of contrast media per Radiology protocol    Answer:   Yes    Order Specific Question:   What is the patient's sedation requirement?    Answer:   No Sedation    Order Specific Question:   Does the patient have a pacemaker or implanted devices?    Answer:   No    Order Specific Question:   Preferred imaging location?    Answer:   [facility] (table limit - 550lbs)   [facility](ebv dna by pcr)    Standing Status:   Future    Number of Occurrences:   1    Standing Expiration Date:   05/26/2022   IFE+PROTEIN ELECTRO, 24-HR UR  Standing Status:   Future    Standing Expiration Date:   05/26/2022   Sedimentation rate    Standing Status:   Future    Number of Occurrences:   1    Standing Expiration Date:   05/26/2022   C-reactive protein    Standing Status:   Future    Number of Occurrences:   1    Standing Expiration Date:   05/26/2022   ANA, IFA (with reflex)    Standing Status:   Future    Number of Occurrences:   1    Standing Expiration Date:   05/26/2022   AFP tumor marker    Standing Status:   Future    Number of Occurrences:   1    Standing Expiration Date:   05/26/2022   Hepatitis panel, acute    Standing Status:   Future    Number of Occurrences:   1    Standing Expiration Date:   05/26/2022    All questions were answered.  The patient knows to call the clinic with any problems questions or concerns.  cc Adin Hector, MD   Thank you for this kind referral and the opportunity to participate in the care of this patient. A copy of today's note is routed to referring provider   Earlie Server, MD, PhD [facility] Hematology Oncology 05/26/2021

## 2021-05-26 NOTE — Progress Notes (Signed)
Patient here for follow up

## 2021-05-27 ENCOUNTER — Ambulatory Visit: Payer: Medicare PPO

## 2021-05-27 ENCOUNTER — Encounter: Payer: Medicare PPO | Admitting: Speech Pathology

## 2021-05-27 DIAGNOSIS — D7219 Other eosinophilia: Secondary | ICD-10-CM | POA: Diagnosis not present

## 2021-05-27 LAB — HEPATITIS PANEL, ACUTE
HCV Ab: NONREACTIVE
Hep A IgM: NONREACTIVE
Hep B C IgM: NONREACTIVE
Hepatitis B Surface Ag: NONREACTIVE

## 2021-05-27 LAB — AFP TUMOR MARKER: AFP, Serum, Tumor Marker: <1.8 ng/mL (ref 0.0–8.4)

## 2021-05-28 ENCOUNTER — Ambulatory Visit: Payer: Medicare PPO | Admitting: Oncology

## 2021-05-28 ENCOUNTER — Other Ambulatory Visit: Payer: Self-pay

## 2021-05-28 DIAGNOSIS — D7219 Other eosinophilia: Secondary | ICD-10-CM

## 2021-05-28 DIAGNOSIS — R768 Other specified abnormal immunological findings in serum: Secondary | ICD-10-CM

## 2021-05-28 DIAGNOSIS — K769 Liver disease, unspecified: Secondary | ICD-10-CM

## 2021-05-29 LAB — IFE+PROTEIN ELECTRO, 24-HR UR
% BETA, Urine: 13.1 %
ALPHA 1 URINE: 3.1 %
Albumin, U: 55.8 %
Alpha 2, Urine: 6.6 %
GAMMA GLOBULIN URINE: 21.5 %
Total Protein, Urine-Ur/day: 525 mg/24 hr — ABNORMAL HIGH (ref 30–150)
Total Protein, Urine: 33.9 mg/dL
Total Volume: 1550

## 2021-05-29 LAB — EPSTEIN BARR VRS(EBV DNA BY PCR): EBV DNA QN by PCR: NEGATIVE IU/mL

## 2021-05-31 LAB — ANTINUCLEAR ANTIBODIES, IFA: ANA Ab, IFA: NEGATIVE

## 2021-06-01 ENCOUNTER — Other Ambulatory Visit: Payer: Self-pay

## 2021-06-01 ENCOUNTER — Ambulatory Visit: Payer: Medicare PPO

## 2021-06-01 ENCOUNTER — Encounter: Payer: Medicare PPO | Admitting: Speech Pathology

## 2021-06-01 ENCOUNTER — Ambulatory Visit
Admission: RE | Admit: 2021-06-01 | Discharge: 2021-06-01 | Disposition: A | Discharge status: Home / Self Care | Payer: Medicare PPO | Source: Ambulatory Visit | Attending: Oncology | Admitting: Oncology

## 2021-06-01 DIAGNOSIS — K769 Liver disease, unspecified: Secondary | ICD-10-CM | POA: Insufficient documentation

## 2021-06-01 MED ORDER — GADOBUTROL 1 MMOL/ML IV SOLN
7.0000 mL | Freq: Once | INTRAVENOUS | Status: AC | PRN
  Administered 2021-06-01: 7 mL via INTRAVENOUS

## 2021-06-03 ENCOUNTER — Encounter: Payer: Medicare PPO | Admitting: Speech Pathology

## 2021-06-03 ENCOUNTER — Ambulatory Visit: Payer: Medicare PPO

## 2021-06-08 ENCOUNTER — Ambulatory Visit: Payer: Medicare PPO

## 2021-06-08 ENCOUNTER — Encounter: Payer: Medicare PPO | Admitting: Speech Pathology

## 2021-06-10 ENCOUNTER — Ambulatory Visit: Payer: Medicare PPO

## 2021-06-10 ENCOUNTER — Encounter: Payer: Medicare PPO | Admitting: Speech Pathology

## 2021-06-15 ENCOUNTER — Telehealth: Payer: Self-pay

## 2021-06-15 ENCOUNTER — Ambulatory Visit: Payer: Medicare PPO

## 2021-06-15 ENCOUNTER — Encounter: Payer: Medicare PPO | Admitting: Speech Pathology

## 2021-06-15 DIAGNOSIS — R768 Other specified abnormal immunological findings in serum: Secondary | ICD-10-CM

## 2021-06-15 DIAGNOSIS — Z1211 Encounter for screening for malignant neoplasm of colon: Secondary | ICD-10-CM

## 2021-06-15 NOTE — Telephone Encounter (Signed)
Ill do this one ?

## 2021-06-15 NOTE — Telephone Encounter (Signed)
Patient and wife, [name], informed of MD recommendation and follow up plan. Will fax referral to [facility].  ? ?Please schedule pt for lab/MD in 6 months and inform pt of appt details. Thanks   ?

## 2021-06-15 NOTE — Telephone Encounter (Signed)
-----   Message from Earlie Server, MD sent at 06/13/2021  3:35 PM EST ----- ?Let her know that his blood work results did not reveal any obvious etiology of increase of his white count.  ?Probably due to her medication.  ?I recommend her to re-establish with [facility]. Thanks.  ?Follow up with me in 6 months, cbc cmp Lab MD ?

## 2021-06-15 NOTE — Telephone Encounter (Signed)
done

## 2021-06-17 ENCOUNTER — Encounter: Payer: Medicare PPO | Admitting: Speech Pathology

## 2021-06-17 ENCOUNTER — Ambulatory Visit: Payer: Medicare PPO

## 2021-06-22 ENCOUNTER — Ambulatory Visit: Payer: Medicare PPO

## 2021-06-22 ENCOUNTER — Encounter: Payer: Medicare PPO | Admitting: Speech Pathology

## 2021-06-24 ENCOUNTER — Ambulatory Visit: Payer: Medicare PPO

## 2021-06-24 ENCOUNTER — Encounter: Payer: Medicare PPO | Admitting: Speech Pathology

## 2021-06-29 ENCOUNTER — Ambulatory Visit: Payer: Medicare PPO

## 2021-06-29 ENCOUNTER — Encounter: Payer: Medicare PPO | Admitting: Speech Pathology

## 2021-07-01 ENCOUNTER — Telehealth: Payer: Self-pay | Admitting: *Deleted

## 2021-07-01 ENCOUNTER — Ambulatory Visit: Payer: Medicare PPO

## 2021-07-01 DIAGNOSIS — D72828 Other elevated white blood cell count: Secondary | ICD-10-CM

## 2021-07-01 NOTE — Telephone Encounter (Signed)
I called Dr Aquilla Hacker office at 37 PM and left message with receptionist for him to call Dr Collie Siad cell and gave her the number ?

## 2021-07-01 NOTE — Addendum Note (Signed)
Addended by: Betti Cruz on: 07/01/2021 03:36 PM ? ? Modules accepted: Orders ? ?

## 2021-07-01 NOTE — Telephone Encounter (Signed)
Called reporting that patient saw his PCP Dr Caryl Comes who drew labs and his WBC is elevated, Dr Caryl Comes has him coming back for repeat labs 07/14/21 and asked her to call Dr Tasia Catchings to see if he needs to come back before the 11th or if we need to see him. Please advise ? ?CBC w/auto Differential (5 Part) ?Specimen:  Blood ? Ref Range & Units 7 d ago Comments  ?WBC (White Blood Cell Count) 4.1 - 10.2 10?3/uL 19.5 High     ?RBC (Red Blood Cell Count) 4.69 - 6.13 10?6/uL 5.09    ?Hemoglobin 14.1 - 18.1 gm/dL 14.8    ?Hematocrit 40.0 - 52.0 % 45.9    ?MCV (Mean Corpuscular Volume) 80.0 - 100.0 fl 90.2    ?MCH (Mean Corpuscular Hemoglobin) 27.0 - 31.2 pg 29.1    ?MCHC (Mean Corpuscular Hemoglobin Concentration) 32.0 - 36.0 gm/dL 32.2    ?Platelet Count 150 - 450 10?3/uL 231    ?RDW-CV (Red Cell Distribution Width) 11.6 - 14.8 % 14.4    ?MPV (Mean Platelet Volume) 9.4 - 12.4 fl 10.3    ?Neutrophils 1.50 - 7.80 10?3/uL 7.78    ?Lymphocytes 1.00 - 3.60 10?3/uL 1.90    ?Monocytes 0.00 - 1.50 10?3/uL 1.08    ?Eosinophils 0.00 - 0.55 10?3/uL 8.55 High   Smear review agrees with analyzer results, Results consistent with patient history  ?Basophils 0.00 - 0.09 10?3/uL 0.13 High     ?Neutrophil % 32.0 - 70.0 % 39.9    ?Lymphocyte % 10.0 - 50.0 % 9.8 Low     ?Monocyte % 4.0 - 13.0 % 5.5    ?Eosinophil % 1.0 - 5.0 % 43.9 High     ?Basophil% 0.0 - 2.0 % 0.7    ?Immature Granulocyte % <=0.7 % 0.2    ?Immature Granulocyte Count <=0.06 10^3/?L 0.03    ?Resulting Agency  [facility]   ?Specimen Collected: 06/24/21 17:21 Last Resulted: 06/24/21 18:02  ?Received From: [facility]  Result Received: 07/01/21 10:24  ?Comprehensive Metabolic Panel (CMP) ?Specimen:  Blood ? Ref Range & Units 7 d ago  ?Glucose 70 - 110 mg/dL 93   ?Sodium 136 - 145 mmol/L 137   ?Potassium 3.6 - 5.1 mmol/L 5.0   ?Chloride 97 - 109 mmol/L 104   ?Carbon Dioxide (CO2) 22.0 - 32.0 mmol/L 28.1   ?Urea Nitrogen (BUN) 7 - 25 mg/dL 32 High     ?Creatinine 0.7 - 1.3 mg/dL 1.7 High    ?Glomerular Filtration Rate (eGFR), MDRD Estimate >60 mL/min/1.73sq m 41 Low    ?Calcium 8.7 - 10.3 mg/dL 9.2   ?AST  8 - 39 U/L 8   ?ALT  6 - 57 U/L <3 Low    ?Alk Phos (alkaline Phosphatase) 34 - 104 U/L 102   ?Albumin 3.5 - 4.8 g/dL 3.9   ?Bilirubin, Total 0.3 - 1.2 mg/dL 0.3   ?Protein, Total 6.1 - 7.9 g/dL 8.8 High    ?A/G Ratio 1.0 - 5.0 gm/dL 0.8 Low    ?Resulting Agency  [facility]  ?Specimen Collected: 06/24/21 17:21 Last Resulted: 06/24/21 18:22  ?Received From: [place]  Result Received: 07/01/21 10:24  ? View Encounter  ? ?Recent Data from [facility] ?Related to Comprehensive Metabolic Panel (CMP) ?Component 06/24/21 03/25/21 11/27/20 04/14/20 10/17/19 04/10/19  ?Glucose 93 71 90 90 90 88  ?Sodium 137 141 140 140 138 137  ?Potassium 5.0 3.4  Low  3.9 4.2 4.6 5.1  ?Chloride 104 101 103 104 103 103  ?Carbon Dioxide (CO2) 28.1 32.5 High  33.0 High  30.4 29.3 29.1  ?Urea Nitrogen (BUN) 32 High  14 16 15 24 23   ?Creatinine 1.7 High  1.2 1.2 1.1 1.3 1.2  ?Glomerular Filtration Rate (eGFR), MDRD Estimate 41 Low  61 61 67 55 Low  61  ?Calcium 9.2 9.2 9.0 9.0 9.2 9.3  ?AST  8 9 11 11 11 15   ?ALT  <3 Low  3 Low  4 Low  <3 Low  <3 Low  6  ?Alk Phos (alkaline Phosphatase) 102 108 High  99 104 82 97  ?Albumin 3.9 4.1 3.9 4.1 4.0 4.2  ?Bilirubin, Total 0.3 0.6 0.4 0.5 0.8 0.7  ?Protein, Total 8.8 High  8.0 High  7.1 6.7 6.7 6.6  ?A/G Ratio 0.8 Low  1.1 1.2 1.6 1.5 1.8  ?View all related data  ? ?

## 2021-07-02 ENCOUNTER — Inpatient Hospital Stay: Payer: Medicare PPO | Attending: Oncology

## 2021-07-02 DIAGNOSIS — D72828 Other elevated white blood cell count: Secondary | ICD-10-CM

## 2021-07-02 DIAGNOSIS — D7219 Other eosinophilia: Secondary | ICD-10-CM | POA: Diagnosis present

## 2021-07-02 LAB — CBC WITH DIFFERENTIAL/PLATELET
Abs Immature Granulocytes: 0.06 10*3/uL (ref 0.00–0.07)
Basophils Absolute: 0.1 10*3/uL (ref 0.0–0.1)
Basophils Relative: 1 %
Eosinophils Absolute: 8.2 10*3/uL — ABNORMAL HIGH (ref 0.0–0.5)
Eosinophils Relative: 43 %
HCT: 43.6 % (ref 39.0–52.0)
Hemoglobin: 14.1 g/dL (ref 13.0–17.0)
Immature Granulocytes: 0 %
Lymphocytes Relative: 9 %
Lymphs Abs: 1.8 10*3/uL (ref 0.7–4.0)
MCH: 28.7 pg (ref 26.0–34.0)
MCHC: 32.3 g/dL (ref 30.0–36.0)
MCV: 88.6 fL (ref 80.0–100.0)
Monocytes Absolute: 1.1 10*3/uL — ABNORMAL HIGH (ref 0.1–1.0)
Monocytes Relative: 6 %
Neutro Abs: 8 10*3/uL — ABNORMAL HIGH (ref 1.7–7.7)
Neutrophils Relative %: 41 %
Platelets: 236 10*3/uL (ref 150–400)
RBC: 4.92 MIL/uL (ref 4.22–5.81)
RDW: 14.3 % (ref 11.5–15.5)
WBC: 19.4 10*3/uL — ABNORMAL HIGH (ref 4.0–10.5)
nRBC: 0 % (ref 0.0–0.2)

## 2021-07-02 LAB — PATHOLOGIST SMEAR REVIEW

## 2021-07-03 ENCOUNTER — Other Ambulatory Visit: Payer: Medicare PPO

## 2021-07-03 ENCOUNTER — Inpatient Hospital Stay: Payer: Medicare PPO

## 2021-07-06 ENCOUNTER — Emergency Department: Payer: Medicare PPO

## 2021-07-06 ENCOUNTER — Other Ambulatory Visit: Payer: Self-pay

## 2021-07-06 ENCOUNTER — Ambulatory Visit: Payer: Medicare PPO | Admitting: Speech Pathology

## 2021-07-06 ENCOUNTER — Encounter: Payer: Self-pay | Admitting: Emergency Medicine

## 2021-07-06 ENCOUNTER — Emergency Department
Admission: EM | Admit: 2021-07-06 | Discharge: 2021-07-06 | Disposition: A | Discharge status: Home / Self Care | Payer: Medicare PPO | Attending: Emergency Medicine | Admitting: Emergency Medicine

## 2021-07-06 ENCOUNTER — Ambulatory Visit: Payer: Medicare PPO

## 2021-07-06 DIAGNOSIS — N189 Chronic kidney disease, unspecified: Secondary | ICD-10-CM | POA: Diagnosis not present

## 2021-07-06 DIAGNOSIS — N179 Acute kidney failure, unspecified: Secondary | ICD-10-CM | POA: Insufficient documentation

## 2021-07-06 DIAGNOSIS — R531 Weakness: Secondary | ICD-10-CM | POA: Insufficient documentation

## 2021-07-06 DIAGNOSIS — R4701 Aphasia: Secondary | ICD-10-CM | POA: Diagnosis present

## 2021-07-06 DIAGNOSIS — R569 Unspecified convulsions: Secondary | ICD-10-CM

## 2021-07-06 DIAGNOSIS — R2990 Unspecified symptoms and signs involving the nervous system: Secondary | ICD-10-CM | POA: Diagnosis not present

## 2021-07-06 DIAGNOSIS — G20 Parkinson's disease: Secondary | ICD-10-CM | POA: Diagnosis not present

## 2021-07-06 LAB — COMPREHENSIVE METABOLIC PANEL
ALT: <5 U/L (ref 0–44)
AST: 13 U/L — ABNORMAL LOW (ref 15–41)
Albumin: 3.6 g/dL (ref 3.5–5.0)
Alkaline Phosphatase: 104 U/L (ref 38–126)
Anion gap: 7 (ref 5–15)
BUN: 34 mg/dL — ABNORMAL HIGH (ref 8–23)
CO2: 28 mmol/L (ref 22–32)
Calcium: 9.1 mg/dL (ref 8.9–10.3)
Chloride: 102 mmol/L (ref 98–111)
Creatinine, Ser: 2.01 mg/dL — ABNORMAL HIGH (ref 0.61–1.24)
GFR, Estimated: 36 mL/min — ABNORMAL LOW (ref >60)
Glucose, Bld: 101 mg/dL — ABNORMAL HIGH (ref 70–99)
Potassium: 4.9 mmol/L (ref 3.5–5.1)
Sodium: 137 mmol/L (ref 135–145)
Total Bilirubin: 0.8 mg/dL (ref 0.3–1.2)
Total Protein: 9.2 g/dL — ABNORMAL HIGH (ref 6.5–8.1)

## 2021-07-06 LAB — DIFFERENTIAL
Abs Immature Granulocytes: 0.08 10*3/uL — ABNORMAL HIGH (ref 0.00–0.07)
Basophils Absolute: 0.1 10*3/uL (ref 0.0–0.1)
Basophils Relative: 1 %
Eosinophils Absolute: 8.7 10*3/uL — ABNORMAL HIGH (ref 0.0–0.5)
Eosinophils Relative: 46 %
Immature Granulocytes: 0 %
Lymphocytes Relative: 9 %
Lymphs Abs: 1.7 10*3/uL (ref 0.7–4.0)
Monocytes Absolute: 1.2 10*3/uL — ABNORMAL HIGH (ref 0.1–1.0)
Monocytes Relative: 6 %
Neutro Abs: 7 10*3/uL (ref 1.7–7.7)
Neutrophils Relative %: 38 %
Smear Review: NORMAL

## 2021-07-06 LAB — URINALYSIS, ROUTINE W REFLEX MICROSCOPIC
Bacteria, UA: NONE SEEN
Bilirubin Urine: NEGATIVE
Glucose, UA: NEGATIVE mg/dL
Ketones, ur: NEGATIVE mg/dL
Leukocytes,Ua: NEGATIVE
Nitrite: NEGATIVE
Protein, ur: 30 mg/dL — AB
Specific Gravity, Urine: 1.014 (ref 1.005–1.030)
Squamous Epithelial / HPF: NONE SEEN (ref 0–5)
pH: 6 (ref 5.0–8.0)

## 2021-07-06 LAB — CBC
HCT: 45.1 % (ref 39.0–52.0)
Hemoglobin: 14.2 g/dL (ref 13.0–17.0)
MCH: 28.1 pg (ref 26.0–34.0)
MCHC: 31.5 g/dL (ref 30.0–36.0)
MCV: 89.1 fL (ref 80.0–100.0)
Platelets: 240 10*3/uL (ref 150–400)
RBC: 5.06 MIL/uL (ref 4.22–5.81)
RDW: 14.5 % (ref 11.5–15.5)
WBC: 18.7 10*3/uL — ABNORMAL HIGH (ref 4.0–10.5)
nRBC: 0 % (ref 0.0–0.2)

## 2021-07-06 LAB — APTT: aPTT: 33 seconds (ref 24–36)

## 2021-07-06 LAB — PROTIME-INR
INR: 1.1 (ref 0.8–1.2)
Prothrombin Time: 14.4 seconds (ref 11.4–15.2)

## 2021-07-06 LAB — PATHOLOGIST SMEAR REVIEW

## 2021-07-06 LAB — CBG MONITORING, ED: Glucose-Capillary: 103 mg/dL — ABNORMAL HIGH (ref 70–99)

## 2021-07-06 MED ORDER — LEVETIRACETAM IN NACL 500 MG/100ML IV SOLN
500.0000 mg | Freq: Once | INTRAVENOUS | Status: AC
  Administered 2021-07-06: 500 mg via INTRAVENOUS
  Filled 2021-07-06: qty 100

## 2021-07-06 MED ORDER — LACTATED RINGERS IV BOLUS
1000.0000 mL | Freq: Once | INTRAVENOUS | Status: AC
Start: 2021-07-06 — End: 2021-07-06
  Administered 2021-07-06: 1000 mL via INTRAVENOUS

## 2021-07-06 MED ORDER — SODIUM CHLORIDE 0.9% FLUSH: 3.0000 mL | Freq: Once | INTRAVENOUS | Status: DC

## 2021-07-06 MED ORDER — LEVETIRACETAM 250 MG PO TABS: 250.0000 mg | ORAL_TABLET | Freq: Two times a day (BID) | ORAL | 1 refills | Status: DC

## 2021-07-06 MED ORDER — LEVETIRACETAM 250 MG PO TABS
250.0000 mg | ORAL_TABLET | Freq: Two times a day (BID) | ORAL | Status: DC
Start: 2021-07-06 — End: 2021-07-06

## 2021-07-06 NOTE — Code Documentation (Signed)
Stroke Response Nurse Documentation ?Code Documentation ? ?Cory [name] is a 66 y.o. male arriving to [facility] ED via [facility] on 4/3 with past medical hx of Parkinsons and seizures. On No antithrombotic. Code stroke was activated by ED Triage RN.  ? ?Patient from home where he was LKW at 1015 and now complaining of difficulty speaking. ? ?Stroke team at the bedside on patient arrival. Labs drawn and patient cleared for CT by Dr. Charna Archer. Patient to CT with team. NIHSS 2, see documentation for details and code stroke times. CT Head completed. Patient is not a candidate for IV Thrombolytic due to mild sx's and Per Dr Curly Shores it is thought to be a seizure.  ?Care/Plan: MRI.  ? ?Bedside handoff with ED RN Claiborne Billings.   ? ?[name] ?Stroke Coordinator RN ?  ?

## 2021-07-06 NOTE — ED Notes (Signed)
Wife reports she believes pt's speech is getting worse at this time  ?

## 2021-07-06 NOTE — ED Notes (Signed)
Activated Code Stroke w/[facility] '@10'$ :50 ?

## 2021-07-06 NOTE — ED Notes (Signed)
Pt transported to MRI by this RN  ?

## 2021-07-06 NOTE — ED Provider Notes (Signed)
? ?[facility] ?Provider Note ? ? ? Event Date/Time  ? First MD Initiated Contact with Patient 07/06/21 1053   ?  (approximate) ? ? ?History  ? ?Chief Complaint ?Code Stroke ? ? ?HPI ? ?MARCE CHARLESWORTH is a 66 y.o. male with past medical history of Parkinson disease, focal seizures, CKD, hyperlipidemia, and migraines who presents to the ED for aphasia.  25 of history is obtained from wife at bedside, who states that around 1015 patient had acute onset of word finding difficulties.  She states that his speech was not slurred but that he was using incorrect words at times.  Patient also reported that the left side of his vision seem to go black while he was riding in the car, he additionally complained of pain along the right side of his head.  Vision has improved at the time of arrival to the ED and patient denies any numbness or weakness.  Wife states that he has had similar episodes in the past due to focal seizures.  He had been on lamotrigine for his seizures, but this was gradually weaned off due to concern for leukocytosis.  Wife additionally states that patient has been feeling globally weak for the past couple of weeks, denies fevers, cough, chest pain, shortness of breath, dysuria. ?  ? ? ?Physical Exam  ? ?Triage Vital Signs: ?ED Triage Vitals  ?Enc Vitals Group  ?   BP 07/06/21 1048 110/66  ?   Pulse Rate 07/06/21 1048 89  ?   Resp 07/06/21 1048 17  ?   Temp --   ?   Temp Source 07/06/21 1048 Oral  ?   SpO2 07/06/21 1048 97 %  ?   Weight 07/06/21 1050 150 lb (68 kg)  ?   Height --   ?   Head Circumference --   ?   Peak Flow --   ?   Pain Score 07/06/21 1049 7  ?   Pain Loc --   ?   Pain Edu? --   ?   Excl. in [place]? --   ? ? ?Most recent vital signs: ?Vitals:  ? 07/06/21 1330 07/06/21 1400  ?BP:  (!) 181/93  ?Pulse: 60 61  ?Resp: 20 (!) 21  ?SpO2: 99% 99%  ? ? ?Constitutional: Alert and oriented. ?Eyes: Conjunctivae are normal. ?Head: Atraumatic. ?Nose: No  congestion/rhinnorhea. ?Mouth/Throat: Mucous membranes are moist.  ?Cardiovascular: Normal rate, regular rhythm. Grossly normal heart sounds.  2+ radial pulses bilaterally. ?Respiratory: Normal respiratory effort.  No retractions. Lungs CTAB. ?Gastrointestinal: Soft and nontender. No distention. ?Musculoskeletal: No lower extremity tenderness nor edema.  ?Neurologic:  Normal speech and language. No gross focal neurologic deficits are appreciated. ? ? ? ?ED Results / Procedures / Treatments  ? ?Labs ?(all labs ordered are listed, but only abnormal results are displayed) ?Labs Reviewed  ?CBC - Abnormal; Notable for the following components:  ?    Result Value  ? WBC 18.7 (*)   ? All other components within normal limits  ?DIFFERENTIAL - Abnormal; Notable for the following components:  ? Monocytes Absolute 1.2 (*)   ? Eosinophils Absolute 8.7 (*)   ? Abs Immature Granulocytes 0.08 (*)   ? All other components within normal limits  ?COMPREHENSIVE METABOLIC PANEL - Abnormal; Notable for the following components:  ? Glucose, Bld 101 (*)   ? BUN 34 (*)   ? Creatinine, Ser 2.01 (*)   ? Total Protein 9.2 (*)   ? AST  13 (*)   ? GFR, Estimated 36 (*)   ? All other components within normal limits  ?URINALYSIS, ROUTINE W REFLEX MICROSCOPIC - Abnormal; Notable for the following components:  ? Color, Urine AMBER (*)   ? APPearance HAZY (*)   ? Hgb urine dipstick SMALL (*)   ? Protein, ur 30 (*)   ? All other components within normal limits  ?CBG MONITORING, ED - Abnormal; Notable for the following components:  ? Glucose-Capillary 103 (*)   ? All other components within normal limits  ?RESP PANEL BY RT-PCR (FLU A&B, COVID) ARPGX2  ?PROTIME-INR  ?APTT  ?PATHOLOGIST SMEAR REVIEW  ?I-STAT CREATININE, ED  ? ? ? ?EKG ? ?ED ECG REPORT ?Tempie Hoist, the attending physician, personally viewed and interpreted this ECG. ? ? Date: 07/06/2021 ? EKG Time: 11:16 ? Rate: 72 ? Rhythm: normal sinus rhythm ? Axis: Normal ? Intervals:none ?  ST&T Change: None ? ?RADIOLOGY ?CT head reviewed by me with no hemorrhage or midline shift. ? ?PROCEDURES: ? ?Critical Care performed: No ? ?Procedures ? ? ?MEDICATIONS ORDERED IN ED: ?Medications  ?sodium chloride flush (NS) 0.9 % injection 3 mL ( Intravenous Canceled Entry 07/06/21 1141)  ?levETIRAcetam (KEPPRA) IVPB 500 mg/100 mL premix (0 mg Intravenous Stopped 07/06/21 1401)  ?  Followed by  ?levETIRAcetam (KEPPRA) tablet 250 mg (has no administration in time range)  ?lactated ringers bolus 1,000 mL (0 mLs Intravenous Stopped 07/06/21 1449)  ? ? ? ?IMPRESSION / MDM / ASSESSMENT AND PLAN / ED COURSE  ?I reviewed the triage vital signs and the nursing notes. ?             ?               ? ?66 y.o. male with past medical history of Parkinson disease, hyperlipidemia, focal seizures, migraines, and CKD who presents to the ED for acute onset word finding difficulties with visual field deficit and headache about 45 minutes prior to arrival. ? ?Differential diagnosis includes, but is not limited to, stroke, TIA, intracranial hemorrhage, focal seizure, complicated migraine. ? ?Patient brought immediately to CT scanner after code stroke activated in triage.  Symptoms noted to be improving and CT head reviewed by me with no hemorrhage or midline shift.  Case discussed with Dr. Curly Shores of neurology, and focal seizure thought to be much more likely than acute stroke, especially given his improving symptoms.  Plan to further assess with MRI and observe here in the ED for recurrent symptoms.  Labs thus far are remarkable for chronic leukocytosis as well as mild AKI, we will hydrate with IV fluids.  LFTs are within normal limits. ? ?MRI is unremarkable with no evidence of acute stroke, patient with no further episodes here in the ED concerning for focal seizure.  He was given IV loading dose of Keppra per recommendations of neurology and we will start him on 250 mg of Keppra twice daily.  Patient counseled to follow-up closely with  his PCP for recheck of kidney function and to return to the ED for new worsening symptoms, patient agrees with plan. ? ?  ? ? ?FINAL CLINICAL IMPRESSION(S) / ED DIAGNOSES  ? ?Final diagnoses:  ?Focal seizure ([place])  ?AKI (acute kidney injury) ([place])  ? ? ? ?Rx / DC Orders  ? ?ED Discharge Orders   ? ?      Ordered  ?  levETIRAcetam (KEPPRA) 250 MG tablet  2 times daily       ?  07/06/21 1448  ? ?  ?  ? ?  ? ? ? ?Note:  This document was prepared using Dragon voice recognition software and may include unintentional dictation errors. ?  ?Blake Divine, MD ?07/06/21 1450 ? ?

## 2021-07-06 NOTE — ED Triage Notes (Signed)
Per pt wife, states the pt had sudden onset of garbled speech, loss of left peripheral vision loss, right sided head pain. Pt is a/ox at present. States the vision has cleared up., sx started at 10:15am. ?

## 2021-07-06 NOTE — Progress Notes (Signed)
?   07/06/21 1200  ?Clinical Encounter Type  ?Visited With Patient not available;Family  ?Visit Type Code  ?Spiritual Encounters  ?Spiritual Needs Prayer  ? ?Chaplain responded to Code and provided support to wife of patient. ?

## 2021-07-06 NOTE — Consult Note (Signed)
CODE STROKE- PHARMACY COMMUNICATION ? ? ?Time CODE STROKE called/page received:1052 ? ?Time response to CODE STROKE was made in person: 1100  ? ?Time Stroke Kit retrieved from [place] (only if needed):N/A ? ?Name of Provider/Nurse contacted: Dr. Curly Shores (Neuro) & Nira Conn (RN-stroke coordinator) ? ?Past Medical History:  ?Diagnosis Date  ? Abnormal ankle brachial index   ? Aortic atherosclerosis ([place])   ? Arthritis   ? hips and back  ? Asthma   ? as a baby-   ? Bronchiectasis without complication ([place])   ? CKD (chronic kidney disease) stage 3, GFR 30-59 ml/min (HCC)   ? Coronary artery calcification seen on CT scan   ? 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 ED (erectile dysfunction)   ? Eosinophilia   ? GERD (gastroesophageal reflux disease)   ? rare -   ? History of BPH   ? History of genital warts   ? History of solitary pulmonary nodule   ? Left and right lung nodules on CT in ER 1/20; stable by CT 2/21  ? Hypertriglyceridemia   ? Microscopic hematuria   ? Other chronic pain   ? Parkinson's disease ([place])   ? Pneumonia   ? age 66  ? Post-traumatic osteoarthritis of left knee   ? Rash   ? legs- on going  ? Seizure-like activity ([place])   ? Umbilical hernia   ? ?Prior to Admission medications   ?Medication Sig Start Date End Date Taking? Authorizing Provider  ?Amantadine HCl 100 MG tablet Take 100 mg by mouth 3 (three) times daily.    [provider]  ?aspirin EC 81 MG tablet Take 81 mg by mouth daily. Swallow whole.    [provider]  ?azelastine (ASTELIN) 0.1 % nasal spray Place 1-2 sprays into both nostrils as directed. 03/27/20   [provider]  ?carbidopa-levodopa (SINEMET CR) 50-200 MG tablet Take 1 tablet by mouth at bedtime.    [provider]  ?carbidopa-levodopa (SINEMET IR) 25-100 MG tablet Take 2 tablets by mouth 3 (three) times daily.     [provider]  ?cyanocobalamin 1000 MCG tablet Take 1 tablet (1,000 mcg total) by mouth daily. 05/25/20   Jennye Boroughs, MD  ?entacapone (COMTAN) 200 MG tablet Take 200 mg by mouth 3 (three) times daily. 04/17/20   [provider]  ?fludrocortisone (FLORINEF) 0.1 MG tablet Take 100 mcg by mouth every evening. ?Patient not taking: Reported on 04/27/2021 04/01/20   [provider]  ?fluticasone-salmeterol (ADVAIR) 250-50 MCG/ACT AEPB Inhale into the lungs. 02/24/21   [provider]  ?Fluticasone-Umeclidin-Vilant (TRELEGY ELLIPTA) 100-62.5-25 MCG/INH AEPB Inhale 1 puff into the lungs daily as needed. 06/15/19   [provider]  ?lamoTRIgine (LAMICTAL) 100 MG tablet Take 100 mg by mouth daily.    [provider]  ?memantine (NAMENDA) 5 MG tablet Take 5 mg by mouth 2 (two) times daily. ?Patient not taking: Reported on 04/27/2021    [provider]  ?midodrine (PROAMATINE) 2.5 MG tablet Take 2.5-5 mg by mouth See admin instructions. Take 2m at breakfast (as needed), 2.578mat lunch (as needed) and take 2.1m2mt supper (as needed) for low blood pressure 01/28/20   [provider]  ?Multiple Vitamin (MULTIVITAMIN WITH MINERALS) TABS tablet Take 1 tablet by mouth daily. 05/25/20   AyiJennye BoroughsD  ?MYRBETRIQ 50 MG TB24 tablet Take 50 mg by mouth at bedtime. 03/06/20   [provider]  ?pravastatin (PRAVACHOL) 10 MG tablet Take 10  mg by mouth daily. 06/23/21   [provider]  ?sertraline (ZOLOFT) 50 MG tablet Take 50 mg by mouth daily.    [provider]  ?tamsulosin (FLOMAX) 0.4 MG CAPS capsule Take 1 capsule (0.4 mg total) by mouth daily after supper. 04/08/18   Merlyn Lot, MD  ?tiZANidine (ZANAFLEX) 4 MG tablet Take 4 mg by mouth daily as needed for muscle spasms.     [provider]  ?amantadine (SYMMETREL) 100 MG capsule Take by mouth. 05/26/21 07/06/21  [provider]  ? ? ?Lorna Dibble ,PharmD ?Clinical Pharmacist  ?07/06/2021  1:25 PM ? ? ?

## 2021-07-06 NOTE — Consult Note (Addendum)
Neurology Consultation ?Reason for Consult: Code stroke for garbled speech and vision changes ?Requesting Physician: Blake Divine ? ?CC: Slurred speech and vision changes ? ?History is obtained from: Patient, wife at bedside, and chart review ? ?HPI: Cory [name] is a 66 y.o. male with a past medical history significant for Parkinson's disease, focal seizures (associated with speech difficulties and right-sided weakness). ? ?He has been having 2 weeks of lethargy and feeling "completely off" per his wife.  He does have some baseline cognitive impairment secondary to his Parkinson's disease.  He has also recently been found to have an elevated white blood cell count which is being worked up by oncology/pathology.  In this setting a decision was made to wean his lamotrigine and he took his last dose of 25 mg on 3/13.  Subsequently he had one of his typical spells today, very similar to his events in January and February as described previously in neurology notes. ? ?By the time of my evaluation, he is essentially at his baseline other than some mild slowness to his speech and difficulty naming parts of glasses ? ?EEG 05/23/2020  ?-Continuous slow, right hemisphere, maximal right temporal region ?-Intermittent slow, generalized ? ?LKW: 10:15 AM ?tPA given?: No, back to baseline ?Premorbid modified rankin scale:  ?    2 - Slight disability. Able to look after own affairs without assistance, but unable to carry out all previous activities. ? ? ?ROS: All other review of systems was negative except as noted in the HPI, obtained from his wife given his baseline cognitive impairment ? ?Past Medical History:  ?Diagnosis Date  ? Abnormal ankle brachial index   ? Aortic atherosclerosis ([place])   ? Arthritis   ? hips and back  ? Asthma   ? as a baby-   ? Bronchiectasis without complication ([place])   ? CKD (chronic kidney disease) stage 3, GFR 30-59 ml/min (HCC)   ? Coronary artery calcification seen on CT scan   ? Dyskinesia  due to Parkinson's disease ([place])   ? ED (erectile dysfunction)   ? Eosinophilia   ? GERD (gastroesophageal reflux disease)   ? rare -   ? History of BPH   ? History of genital warts   ? History of solitary pulmonary nodule   ? Left and right lung nodules on CT in ER 1/20; stable by CT 2/21  ? Hypertriglyceridemia   ? Microscopic hematuria   ? Other chronic pain   ? Parkinson's disease ([place])   ? Pneumonia   ? age 49  ? Post-traumatic osteoarthritis of left knee   ? Rash   ? legs- on going  ? Seizure-like activity ([place])   ? Umbilical hernia   ? ?Past Surgical History:  ?Procedure Laterality Date  ? APPENDECTOMY    ? BACK SURGERY    ? posterior lumbar spine fusion L4-5  ? COLONOSCOPY    ? COLONOSCOPY WITH PROPOFOL N/A 06/06/2017  ? Procedure: COLONOSCOPY WITH PROPOFOL;  Surgeon: Manya Silvas, MD;  Location: [facility] ENDOSCOPY;  Service: Endoscopy;  Laterality: N/A;  ? KNEE ARTHROSCOPY Bilateral   ? ACL  ? KNEE ARTHROSCOPY W/ ACL RECONSTRUCTION    ? RADIAL KERATOTOMY    ? ROTATOR CUFF REPAIR Bilateral   ? SHOULDER ARTHROSCOPY WITH BICEPS TENDON REPAIR Left 04/16/2019  ? Procedure: SHOULDER ARTHROSCOPY WITH BICEPS TENDON REPAIR, MINI OPEN SUPERIOR CAPSULAR RECONSTRUCTION, BICEPS TENODESIS;  Surgeon: Leim Fabry, MD;  Location: [facility] ORS;  Service: Orthopedics;  Laterality: Left;  ? ? ?  Current Facility-Administered Medications:  ?  sodium chloride flush (NS) 0.9 % injection 3 mL, 3 mL, Intravenous, Once, Blake Divine, MD ? ?Current Outpatient Medications:  ?  Amantadine HCl 100 MG tablet, Take 100 mg by mouth 3 (three) times daily., Disp: , Rfl:  ?  aspirin EC 81 MG tablet, Take 81 mg by mouth daily. Swallow whole., Disp: , Rfl:  ?  azelastine (ASTELIN) 0.1 % nasal spray, Place 1-2 sprays into both nostrils as directed., Disp: , Rfl:  ?  carbidopa-levodopa (SINEMET CR) 50-200 MG tablet, Take 1 tablet by mouth at bedtime., Disp: , Rfl:  ?  carbidopa-levodopa (SINEMET IR) 25-100 MG tablet, Take 2 tablets by mouth 3 (three)  times daily. , Disp: , Rfl:  ?  cyanocobalamin 1000 MCG tablet, Take 1 tablet (1,000 mcg total) by mouth daily., Disp: 30 tablet, Rfl: 0 ?  entacapone (COMTAN) 200 MG tablet, Take 200 mg by mouth 3 (three) times daily., Disp: , Rfl:  ?  fludrocortisone (FLORINEF) 0.1 MG tablet, Take 100 mcg by mouth every evening. (Patient not taking: Reported on 04/27/2021), Disp: , Rfl:  ?  fluticasone-salmeterol (ADVAIR) 250-50 MCG/ACT AEPB, Inhale into the lungs., Disp: , Rfl:  ?  Fluticasone-Umeclidin-Vilant (TRELEGY ELLIPTA) 100-62.5-25 MCG/INH AEPB, Inhale 1 puff into the lungs daily as needed., Disp: , Rfl:  ?  lamoTRIgine (LAMICTAL) 100 MG tablet, Take 100 mg by mouth daily., Disp: , Rfl:  ?  memantine (NAMENDA) 5 MG tablet, Take 5 mg by mouth 2 (two) times daily. (Patient not taking: Reported on 04/27/2021), Disp: , Rfl:  ?  midodrine (PROAMATINE) 2.5 MG tablet, Take 2.5-5 mg by mouth See admin instructions. Take '5mg'$  at breakfast (as needed), 2.'5mg'$  at lunch (as needed) and take 2.'5mg'$  at supper (as needed) for low blood pressure, Disp: , Rfl:  ?  Multiple Vitamin (MULTIVITAMIN WITH MINERALS) TABS tablet, Take 1 tablet by mouth daily., Disp: , Rfl:  ?  MYRBETRIQ 50 MG TB24 tablet, Take 50 mg by mouth at bedtime., Disp: , Rfl:  ?  pravastatin (PRAVACHOL) 10 MG tablet, Take 10 mg by mouth daily., Disp: , Rfl:  ?  sertraline (ZOLOFT) 50 MG tablet, Take 50 mg by mouth daily., Disp: , Rfl:  ?  tamsulosin (FLOMAX) 0.4 MG CAPS capsule, Take 1 capsule (0.4 mg total) by mouth daily after supper., Disp: 10 capsule, Rfl: 0 ?  tiZANidine (ZANAFLEX) 4 MG tablet, Take 4 mg by mouth daily as needed for muscle spasms. , Disp: , Rfl:  ? ? ?Family History  ?Problem Relation Age of Onset  ? Hypertension Mother   ? Breast cancer Mother   ? Lung cancer Father   ? Breast cancer Sister   ? ? ?Social History:  reports that he has been smoking cigarettes and cigars. He has a 5.75 pack-year smoking history. He has never used smokeless tobacco. He  reports that he does not currently use alcohol after a past usage of about 1.0 standard drink per week. He reports that he does not use drugs. ? ? ?Exam: ?Current vital signs: ?BP (!) 144/75   Pulse 61   Resp 14   Wt 68 kg   SpO2 100%   BMI 23.49 kg/m?  ?Vital signs in last 24 hours: ?Pulse Rate:  [61-89] 61 (04/03 1230) ?Resp:  [14-17] 14 (04/03 1230) ?BP: (110-144)/(66-75) 144/75 (04/03 1130) ?SpO2:  [95 %-100 %] 100 % (04/03 1230) ?Weight:  [68 kg] 68 kg (04/03 1050) ? ? ?Physical Exam  ?Constitutional: Appears  well-developed and well-nourished.  ?Psych: Affect appropriate to situation, calm, cooperative, jokes with examiner ?Eyes: No scleral injection ?HENT: No oropharyngeal obstruction.  ?MSK: no joint deformities.  ?Cardiovascular: Normal rate and regular rhythm. Perfusing extremities well ?Respiratory: Effort normal, non-labored breathing ?GI: Soft.  No distension. There is no tenderness.  ?Skin: Warm dry and intact visible skin, however he did have white/purple discoloration of bilateral fingers, right greater than left at the onset of my evaluation which resolved during the course of my evaluation ? ?Neuro: ?Mental Status: ?Patient is awake, alert, oriented to person, place, month, and situation. ?Patient is able to give some limited history, but his wife feels in significant details ?Initially unable to name frames of the glasses (calls them temples), but wife reports his speech is at his baseline later in my evaluation ?Cranial Nerves: ?II: Visual Fields are full. Pupils are equal, round, and reactive to light.  ?III,IV, VI: EOMI without ptosis or diploplia.  ?V: Facial sensation is symmetric to light touch ?VII: Facial movement is symmetric.  ?VIII: hearing is intact to voice ?X: Uvula elevates symmetrically ?XI: Shoulder shrug is symmetric. ?XII: tongue is midline without atrophy or fasciculations.  ?Motor: ?He is bradykinetic. 5/5 strength was present in all four extremities.  He does have  pronation of the right upper extremity without drift.  He does have a tremor of the right upper extremity which his wife reports is his baseline ?Sensory: ?Sensation is symmetric to light touch in the arms and legs.  No

## 2021-07-06 NOTE — ED Notes (Signed)
This RN introduced self to pt's wife. Wife stating this morning she got him up like normal. Wife states they were heading to speech therapy for parkinsons around 10:15am and pt suddenly lost vision on left side, slurred speech more then usually and words were jumbled, his fingertips appeared blue. Wife states most symptoms have resolved expect expressive aphasia.  ? ? ?

## 2021-07-07 ENCOUNTER — Other Ambulatory Visit: Payer: Self-pay

## 2021-07-07 ENCOUNTER — Emergency Department
Admission: EM | Admit: 2021-07-07 | Discharge: 2021-07-07 | Disposition: A | Discharge status: Home / Self Care | Payer: Medicare PPO | Attending: Emergency Medicine | Admitting: Emergency Medicine

## 2021-07-07 ENCOUNTER — Telehealth: Payer: Self-pay | Admitting: *Deleted

## 2021-07-07 ENCOUNTER — Telehealth: Payer: Self-pay

## 2021-07-07 DIAGNOSIS — J45909 Unspecified asthma, uncomplicated: Secondary | ICD-10-CM | POA: Diagnosis not present

## 2021-07-07 DIAGNOSIS — R531 Weakness: Secondary | ICD-10-CM | POA: Insufficient documentation

## 2021-07-07 DIAGNOSIS — G20 Parkinson's disease: Secondary | ICD-10-CM | POA: Diagnosis not present

## 2021-07-07 DIAGNOSIS — D72828 Other elevated white blood cell count: Secondary | ICD-10-CM

## 2021-07-07 DIAGNOSIS — R569 Unspecified convulsions: Secondary | ICD-10-CM | POA: Diagnosis present

## 2021-07-07 DIAGNOSIS — N183 Chronic kidney disease, stage 3 unspecified: Secondary | ICD-10-CM | POA: Insufficient documentation

## 2021-07-07 LAB — CBG MONITORING, ED: Glucose-Capillary: 113 mg/dL — ABNORMAL HIGH (ref 70–99)

## 2021-07-07 MED ORDER — LEVETIRACETAM 500 MG PO TABS
1000.0000 mg | ORAL_TABLET | Freq: Once | ORAL | Status: AC
  Administered 2021-07-07: 1000 mg via ORAL
  Filled 2021-07-07: qty 2

## 2021-07-07 NOTE — Discharge Instructions (Signed)
Please continue to take the Cory [name] as prescribed.  Please keep your appointment with Dr. Manuella Ghazi tomorrow. ?

## 2021-07-07 NOTE — Telephone Encounter (Signed)
Thank you :)

## 2021-07-07 NOTE — Telephone Encounter (Signed)
-----   Message from Earlie Server, MD sent at 07/07/2021  1:08 PM EDT ----- ?Please move his appt earlier to be 1-2 weeks. Lab cbc MD  thanks.  ?

## 2021-07-07 NOTE — ED Provider Notes (Signed)
? ?[facility] ?Provider Note ? ? ? Event Date/Time  ? First MD Initiated Contact with Patient 07/07/21 1821   ?  (approximate) ? ? ?History  ? ?Seizures (Pt had witnessed grand mal seizure by family today lasting approx 1-2 minutes. Pt newly diagnosed with seizures in January and started keppra yesterday. EMS reports pt was post ictal on arrival for 1-2 minutes but A&O on arrival to hospital. Pt in NAD with even and unlabored respirations. ) ? ? ?HPI ? ?Cory [name] is a 66 y.o. 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  Patient's wife notes that he was started on Lamictal for focal seizures back in January.  This was weaned and he took his last dose on 3/13.  Lamotrigine was weaned because of the persistent leukocytosis which is currently being worked up by oncology/pathology.  Patient presented yesterday after a typical seizure in which she had speech difficulty.  Stroke alert was called and he had an MRI which was essentially unrevealing.  Started on Keppra.  Given 500 mg load, started on 250 twice daily.  Patient was doing well until this afternoon when patient was sitting down and yelled out to his wife to help him.  She found his upper body rather rigid and then he started having some focal movement of the right arm and right leg.  During this time he was not responsive his eyes had rolled back.  This lasted for about 15 seconds and then he improved.  His wife notes that he was back to baseline immediately although triage note notes that EMS said that he was postictal for about 1 to 2 minutes on arrival.  Patient currently feels back to baseline.  Does have a diffuse headache which is typical for him.  Denies any new visual symptoms new no focal numbness tingling or weakness.  Has been overly fatigued over the last several weeks.  Currently being  worked up for persistent leukocytosis by neurology.  Denies fevers chills cough chest pain shortness of breath abdominal pain nausea vomiting or urinary symptoms.  Patient sees Dr. Manuella Ghazi with neurology tomorrow.  ? ?  ? ?Past Medical History:  ?Diagnosis Date  ? Abnormal ankle brachial index   ? Aortic atherosclerosis ([place])   ? Arthritis   ? hips and back  ? Asthma   ? as a baby-   ? Bronchiectasis without complication ([place])   ? CKD (chronic kidney disease) stage 3, GFR 30-59 ml/min (HCC)   ? Coronary artery calcification seen on CT scan   ? 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 ED (erectile dysfunction)   ? Eosinophilia   ? GERD (gastroesophageal reflux disease)   ? rare -   ? History of BPH   ? History of genital warts   ? History of solitary pulmonary nodule   ? Left and right lung nodules on CT in ER 1/20; stable by CT 2/21  ? Hypertriglyceridemia   ? Microscopic hematuria   ? Other chronic pain   ? Parkinson's disease ([place])   ? Pneumonia   ? age 23  ? Post-traumatic osteoarthritis of left knee   ? Rash   ? legs- on going  ? Seizure-like activity ([place])   ? Umbilical hernia   ? ? ?Patient Active Problem List  ? Diagnosis Date Noted  ? Other eosinophilia 05/26/2021  ?  Elevated serum immunoglobulin free light chain level 05/26/2021  ? Liver lesion 05/26/2021  ? Eosinophilia 04/27/2021  ? 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 Thoracic aortic aneurysm ([place]) 04/21/2021  ? Focal seizure ([place]) 05/24/2020  ? Parkinson's disease ([place])   ? History of migraine headaches   ? Migraine syndrome   ? BPH (benign prostatic hyperplasia)   ? Depression   ? Degenerative spondylolisthesis 05/07/2016  ? ? ? ?Physical Exam  ?Triage Vital Signs: ?ED Triage Vitals  ?Enc Vitals Group  ?   BP 07/07/21 1821 (!) 142/77  ?   Pulse Rate 07/07/21 1821 84  ?   Resp 07/07/21 1821 18  ?   Temp 07/07/21 1821 97.9 ?F (36.6 ?C)  ?   Temp Source 07/07/21 1821 Oral  ?   SpO2 07/07/21 1827 97 %  ?   Weight 07/07/21 1825  149 lb 14.6 oz (68 kg)  ?   Height 07/07/21 1825 '5\' 7"'$  (1.702 m)  ?   Head Circumference --   ?   Peak Flow --   ?   Pain Score 07/07/21 1824 0  ?   Pain Loc --   ?   Pain Edu? --   ?   Excl. in [place]? --   ? ? ?Most recent vital signs: ?Vitals:  ? 07/07/21 1930 07/07/21 2000  ?BP: 138/79 137/80  ?Pulse: 77 71  ?Resp: 17 17  ?Temp: 97.9 ?F (36.6 ?C)   ?SpO2: 96% 95%  ? ? ? ?General: Awake, no distress.  ?CV:  Good peripheral perfusion.  ?Resp:  Normal effort.  ?Abd:  No distention.  ?Neuro:             Awake, Alert, Oriented x 3  ?Other:  Aox3, nml speech  ?PERRL, EOMI, face symmetric, nml tongue movement  ?5/5 strength in the BL upper and lower extremities  ?Sensation grossly intact in the BL upper and lower extremities  ?Finger-nose-finger intact BL ? ? ? ?ED Results / Procedures / Treatments  ?Labs ?(all labs ordered are listed, but only abnormal results are displayed) ?Labs Reviewed  ?CBG MONITORING, ED - Abnormal; Notable for the following components:  ?    Result Value  ? Glucose-Capillary 113 (*)   ? All other components within normal limits  ? ? ? ?EKG ? ?EKG interpretation performed by myself: NSR, nml axis, nml intervals, no acute ischemic changes ? ? ? ?RADIOLOGY ? ? ? ?PROCEDURES: ? ?Critical Care performed: No ? ?.1-3 Lead EKG Interpretation ?Performed by: Rada Hay, MD ?Authorized by: Rada Hay, MD  ? ?  Interpretation: normal   ?  ECG rate assessment: normal   ?  Ectopy: none   ?  Conduction: normal   ? ?The patient is on the cardiac monitor to evaluate for evidence of arrhythmia and/or significant heart rate changes. ? ? ?MEDICATIONS ORDERED IN ED: ?Medications  ?levETIRAcetam (KEPPRA) tablet 1,000 mg (1,000 mg Oral Given 07/07/21 1933)  ? ? ? ?IMPRESSION / MDM / ASSESSMENT AND PLAN / ED COURSE  ?I reviewed the triage vital signs and the nursing notes. ?             ?               ? ?Patient is a 66 year old male with Parkinson's disease and history of focal seizures who presents after  likely seizure.  Patient was seen yesterday for a focal seizure that is similar to his typical seizures  which involved speech difficulty and right-sided weakness.  Was made a stroke alert and had a fairly thorough work-up including an MRI that was negative Labs that were overall reassuring without acute infectious process identified.  Patient had been on Lamictal but this was stopped and he was not started on another AED for unclear reasons.  This was likely the cause of his breakthrough seizures so Keppra was started yesterday.  He has had several doses of this so far did take the dose today.  The seizure today was somewhat atypical compared to his other seizures but did involve right-sided movement of the arm and leg as well as generalized rigidity.  Lasted for about 15 seconds and patient had a short postictal period afterward.  Currently he is back to baseline his neurologic exam is nonfocal and he denies any new neurologic deficits.  He has no new infectious symptoms.  Reviewed labs and imaging from yesterday.  Overall my suspicion for acute infection or electrolyte abnormality lowering the seizure threshold is low given work-up just performed yesterday.  Also reassured by his negative MRI.  Overall I suspect that this is just another breakthrough seizure and may just be that he has not reached steady levels of Keppra yet.  We will give another dose of 1 g by mouth as a load.  Patient is seeing Dr. Manuella Ghazi with neurology tomorrow which is reassuring that he can be reassessed and does uptitrated if need be.  Stressed the importance of avoiding driving or other activities in which the patient could be in danger if he would have another seizure.  Ultimately given he is back to baseline with a reassuring work-up yesterday I think he is appropriate for outpatient follow-up. ? ? ?FINAL CLINICAL IMPRESSION(S) / ED DIAGNOSES  ? ?Final diagnoses:  ?Seizure ([place])  ? ? ? ?Rx / DC Orders  ? ?ED Discharge Orders   ? ? None   ? ?  ? ? ? ?Note:  This document was prepared using Dragon voice recognition software and may include unintentional dictation errors. ?  ?Rada Hay, MD ?07/08/21 [phone] ? ?

## 2021-07-07 NOTE — Telephone Encounter (Signed)
Spoke with wife and informed her of Dr Collie Siad recommendation for Labs and appt in the next week or 2.  ? ? ?Please schedule patient for Labs/Md in the week or 2. Please inform patient of appt. Patient aware. Thanks  ?

## 2021-07-07 NOTE — Telephone Encounter (Signed)
Mrs Durden called to report that patient had weaned off his lamotrigine as instructed and then had a seizure yesterday and went to ER where they started another seizure medicine (Keppra). Wife wanted Dr Tasia Catchings to be aware of this ?

## 2021-07-08 ENCOUNTER — Ambulatory Visit: Payer: Medicare PPO | Admitting: Speech Pathology

## 2021-07-08 ENCOUNTER — Ambulatory Visit: Payer: Medicare PPO

## 2021-07-10 ENCOUNTER — Encounter: Payer: Self-pay | Admitting: Oncology

## 2021-07-10 ENCOUNTER — Inpatient Hospital Stay: Payer: Medicare PPO | Attending: Oncology

## 2021-07-10 ENCOUNTER — Telehealth: Payer: Self-pay

## 2021-07-10 ENCOUNTER — Inpatient Hospital Stay: Payer: Medicare PPO | Admitting: Oncology

## 2021-07-10 VITALS — BP 101/66 | HR 86 | Temp 96.0°F | Wt 158.1 lb

## 2021-07-10 DIAGNOSIS — J45909 Unspecified asthma, uncomplicated: Secondary | ICD-10-CM | POA: Diagnosis not present

## 2021-07-10 DIAGNOSIS — R4189 Other symptoms and signs involving cognitive functions and awareness: Secondary | ICD-10-CM | POA: Insufficient documentation

## 2021-07-10 DIAGNOSIS — D7219 Other eosinophilia: Secondary | ICD-10-CM

## 2021-07-10 DIAGNOSIS — Z803 Family history of malignant neoplasm of breast: Secondary | ICD-10-CM | POA: Diagnosis not present

## 2021-07-10 DIAGNOSIS — G20 Parkinson's disease: Secondary | ICD-10-CM | POA: Diagnosis not present

## 2021-07-10 DIAGNOSIS — I700 Atherosclerosis of aorta: Secondary | ICD-10-CM | POA: Diagnosis not present

## 2021-07-10 DIAGNOSIS — Z801 Family history of malignant neoplasm of trachea, bronchus and lung: Secondary | ICD-10-CM | POA: Diagnosis not present

## 2021-07-10 DIAGNOSIS — K769 Liver disease, unspecified: Secondary | ICD-10-CM | POA: Insufficient documentation

## 2021-07-10 DIAGNOSIS — Z720 Tobacco use: Secondary | ICD-10-CM | POA: Insufficient documentation

## 2021-07-10 DIAGNOSIS — R569 Unspecified convulsions: Secondary | ICD-10-CM | POA: Insufficient documentation

## 2021-07-10 DIAGNOSIS — Z79899 Other long term (current) drug therapy: Secondary | ICD-10-CM | POA: Diagnosis not present

## 2021-07-10 DIAGNOSIS — D72828 Other elevated white blood cell count: Secondary | ICD-10-CM

## 2021-07-10 LAB — CBC WITH DIFFERENTIAL/PLATELET
Abs Immature Granulocytes: 0.06 10*3/uL (ref 0.00–0.07)
Basophils Absolute: 0.1 10*3/uL (ref 0.0–0.1)
Basophils Relative: 1 %
Eosinophils Absolute: 10.3 10*3/uL — ABNORMAL HIGH (ref 0.0–0.5)
Eosinophils Relative: 47 %
HCT: 44.6 % (ref 39.0–52.0)
Hemoglobin: 14.4 g/dL (ref 13.0–17.0)
Immature Granulocytes: 0 %
Lymphocytes Relative: 8 %
Lymphs Abs: 1.7 10*3/uL (ref 0.7–4.0)
MCH: 28.9 pg (ref 26.0–34.0)
MCHC: 32.3 g/dL (ref 30.0–36.0)
MCV: 89.6 fL (ref 80.0–100.0)
Monocytes Absolute: 1.2 10*3/uL — ABNORMAL HIGH (ref 0.1–1.0)
Monocytes Relative: 6 %
Neutro Abs: 8.2 10*3/uL — ABNORMAL HIGH (ref 1.7–7.7)
Neutrophils Relative %: 38 %
Platelets: 261 10*3/uL (ref 150–400)
RBC: 4.98 MIL/uL (ref 4.22–5.81)
RDW: 14.3 % (ref 11.5–15.5)
WBC: 21.6 10*3/uL — ABNORMAL HIGH (ref 4.0–10.5)
nRBC: 0 % (ref 0.0–0.2)

## 2021-07-10 NOTE — Progress Notes (Signed)
Patient here for follow up

## 2021-07-10 NOTE — Telephone Encounter (Signed)
Patient BMB form faxed to speciality scheduling. Will inform patient of appt once scheduled.  ?

## 2021-07-11 NOTE — Progress Notes (Signed)
?Hematology/Oncology Progress note ?[phone] [phone] ?   ? ? ?Patient Care Team: ?Adin Hector, MD as PCP - General (Internal Medicine) ?Earlie Server, MD as Consulting Physician (Hematology and Oncology) ?Algernon Huxley, MD as Consulting Physician (Vascular Surgery) ?Vladimir Crofts, MD as Consulting Physician (Neurology) ?Darl Pikes as Referring Physician (Physician Assistant) ? ?REFERRING PROVIDER: ?Adin Hector, MD  ?CHIEF COMPLAINTS/REASON FOR VISIT:  ?Follow up of eosinophilia ? ?HISTORY OF PRESENTING ILLNESS:  ? ?Cory MIZRAHI is a  66 y.o.  male with PMH listed below was seen in consultation at the request of  Adin Hector, MD  for evaluation of eosinophilia ? ?Patient has a history of chronic eosinophilia.  Previous lab results were reviewed. ?Eosinophilia dated back to January 2021, progressively getting worse. ?He also has chronic basophilia. ?Patient has a history of asthma for which he follows up with pulmonology.  He reports that symptoms are well controlled.  He rarely has to use inhalers.  Denies wheezing, shortness of breath. ?Patient has Parkinson's disease, on carbidopa/levodopa, amantadine, Comptan  ?patient has symptoms of dyskinesia, balancing, cognitive impairment.  He is on /memantine for cognitive impairment. ?He also has episodes of orthostatic hypotension.  Has been on midodrine 2.5 mg twice daily. ?He also has history of nonconvulsive seizure and has been on lamotrigine 100 mg daily since March 2022. ?He reports appetite has been good.  Weight has been stable.  No fever or chills, night sweats, skin rash.  ?Patient smokes 1 to 2 cigarettes every 2 weeks ? ?04/15/2021, CT angiogram chest abdomen pelvis ?1. 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 No other aortic aneurysm is seen.There is mild aortic atherosclerosis in the arch segment with mixed plaque, and moderate abdominal aortic mixed plaque. ?2. In the infrarenal aorta, a penetrating atherosclerotic  ulcer along the left anterolateral aortic wall has developed since the ?2020 noncontrast CT, measuring 7.4 mm at the base, with the ulcer itself measuring 9.5 by 3.3 x 10.5 mm. There is no dissection, and no further penetrating ulcers. ?3. Equivocal stranding adjacent the proximal third of the sigmoid colon which could be early acute diverticulitis or fat scarring from a previous episode ?4. Imaging findings of gastroenteritis ?5. Ill-defined area of enhancement in the posterior liver dome measuring 2.1 x 1.9 x 1.5 cm ? ? ?05/24/2020 Echo LVEF 55-60%.  ? ?INTERVAL HISTORY ?Cory [name] is a 66 y.o. male who has above history reviewed by me today presents for follow up visit for eosinophilia. ?It was previously felt that lamotrigine may be related to patient's eosinophilia.  Given that he has not had any seizure like activities, with the permission from his neurologist, patient started to taper down lamotrigine.  He decreased to lamotrigine 50 mg daily for about a week, and then taper down to 25 mg daily.  He was completely tapered off lamotrigine on 06/15/2021. ?07/02/2021, WBC 19.4, absolute eosinophil 8. ? ?07/06/2021, patient has experienced seizure activity.  Vision impairment, word difficulties, no responsive but awake.   ?07/07/2021, ED evaluation with recurrent seizure- activity,.  Patient was started on Keppra.  His MRI brain was unrevealing. ?07/08/2021 patient was seen by neurology Dr. Manuella Ghazi, increased Keppra to 500 mg by mouth twice daily. ? ?Due to the worsening of white count, patient presents for follow-up.  Accompanied by his wife. ?Patient feels not at the baseline.  Still recovering from recent seizure activity. ?He lost 2 pounds since last visit in January 2023. ? ?Review of  Systems  ?Constitutional:  Negative for appetite change, chills, fatigue, fever and unexpected weight change.  ?HENT:   Negative for hearing loss and voice change.   ?Eyes:  Negative for eye problems and icterus.  ?Respiratory:   Negative for chest tightness, cough and shortness of breath.   ?Cardiovascular:  Negative for chest pain and leg swelling.  ?Gastrointestinal:  Negative for abdominal distention and abdominal pain.  ?Endocrine: Negative for hot flashes.  ?Genitourinary:  Negative for difficulty urinating, dysuria and frequency.   ?Musculoskeletal:  Positive for gait problem. Negative for arthralgias.  ?Skin:  Negative for itching and rash.  ?Neurological:  Positive for gait problem, light-headedness and seizures. Negative for numbness.  ?Hematological:  Negative for adenopathy. Does not bruise/bleed easily.  ?Psychiatric/Behavioral:  Negative for confusion.   ? ?MEDICAL HISTORY:  ?Past Medical History:  ?Diagnosis Date  ? Abnormal ankle brachial index   ? Aortic atherosclerosis ([place])   ? Arthritis   ? hips and back  ? Asthma   ? as a baby-   ? Bronchiectasis without complication ([place])   ? CKD (chronic kidney disease) stage 3, GFR 30-59 ml/min (HCC)   ? Coronary artery calcification seen on CT scan   ? 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 ED (erectile dysfunction)   ? Eosinophilia   ? GERD (gastroesophageal reflux disease)   ? rare -   ? History of BPH   ? History of genital warts   ? History of solitary pulmonary nodule   ? Left and right lung nodules on CT in ER 1/20; stable by CT 2/21  ? Hypertriglyceridemia   ? Microscopic hematuria   ? Other chronic pain   ? Parkinson's disease ([place])   ? Pneumonia   ? age 59  ? Post-traumatic osteoarthritis of left knee   ? Rash   ? legs- on going  ? Seizure-like activity ([place])   ? Umbilical hernia   ? ? ?SURGICAL HISTORY: ?Past Surgical History:  ?Procedure Laterality Date  ? APPENDECTOMY    ? BACK SURGERY    ? posterior lumbar spine fusion L4-5  ? COLONOSCOPY    ? COLONOSCOPY WITH PROPOFOL N/A 06/06/2017  ? Procedure: COLONOSCOPY WITH PROPOFOL;  Surgeon: Manya Silvas, MD;  Location: [facility] ENDOSCOPY;  Service: Endoscopy;  Laterality: N/A;  ? KNEE ARTHROSCOPY Bilateral   ? ACL  ?  KNEE ARTHROSCOPY W/ ACL RECONSTRUCTION    ? RADIAL KERATOTOMY    ? ROTATOR CUFF REPAIR Bilateral   ? SHOULDER ARTHROSCOPY WITH BICEPS TENDON REPAIR Left 04/16/2019  ? Procedure: SHOULDER ARTHROSCOPY WITH BICEPS TENDON REPAIR, MINI OPEN SUPERIOR CAPSULAR RECONSTRUCTION, BICEPS TENODESIS;  Surgeon: Leim Fabry, MD;  Location: [facility] ORS;  Service: Orthopedics;  Laterality: Left;  ? ? ?SOCIAL HISTORY: ?Social History  ? ?Socioeconomic History  ? Marital status: Married  ?  Spouse name: Not on file  ? Number of children: Not on file  ? Years of education: Not on file  ? Highest education level: Not on file  ?Occupational History  ? Not on file  ?Tobacco Use  ? Smoking status: Some Days  ?  Packs/day: 0.25  ?  Years: 23.00  ?  Pack years: 5.75  ?  Types: Cigarettes, Cigars  ? Smokeless tobacco: Never  ? Tobacco comments:  ?  smokes 1 cigar per day 04/30/2016  ?Vaping Use  ? Vaping Use: Never used  ?Substance and Sexual Activity  ? Alcohol use: Not Currently  ?  Alcohol/week: 1.0  standard drink  ?  Types: 1 Shots of liquor per week  ?  Comment: has not had in 2 years  ? Drug use: No  ? Sexual activity: Not on file  ?Other Topics Concern  ? Not on file  ?Social History Narrative  ? Not on file  ? ?Social Determinants of Health  ? ?Financial Resource Strain: Not on file  ?Food Insecurity: Not on file  ?Transportation Needs: Not on file  ?Physical Activity: Not on file  ?Stress: Not on file  ?Social Connections: Not on file  ?Intimate Partner Violence: Not on file  ? ? ?FAMILY HISTORY: ?Family History  ?Problem Relation Age of Onset  ? Hypertension Mother   ? Breast cancer Mother   ? Lung cancer Father   ? Breast cancer Sister   ? ? ?ALLERGIES:  has No Known Allergies. ? ?MEDICATIONS:  ?Current Outpatient Medications  ?Medication Sig Dispense Refill  ? Amantadine HCl 100 MG tablet Take 100 mg by mouth 3 (three) times daily.    ? aspirin EC 81 MG tablet Take 81 mg by mouth daily. Swallow whole.    ? carbidopa-levodopa (SINEMET  CR) 50-200 MG tablet Take 1 tablet by mouth at bedtime.    ? carbidopa-levodopa (SINEMET IR) 25-100 MG tablet Take 2 tablets by mouth 3 (three) times daily.     ? cyanocobalamin 1000 MCG tablet Take 1 tablet

## 2021-07-13 ENCOUNTER — Ambulatory Visit: Payer: Medicare PPO

## 2021-07-13 ENCOUNTER — Ambulatory Visit: Payer: Medicare PPO | Attending: Internal Medicine | Admitting: Speech Pathology

## 2021-07-13 DIAGNOSIS — G20 Parkinson's disease: Secondary | ICD-10-CM | POA: Diagnosis present

## 2021-07-13 DIAGNOSIS — R471 Dysarthria and anarthria: Secondary | ICD-10-CM | POA: Diagnosis present

## 2021-07-13 NOTE — Therapy (Signed)
[facility] ?[place], [place], [address] ?Phone: [phone]   Fax:  [phone] ? ?Speech Language Pathology Evaluation ? ?Patient Details  ?Name: Cory [name] ?MRN: [id number] ?Date of Birth: December 05, 1955 ?Referring Provider (SLP): Ramonita Lab III, MD ? ? ?Encounter Date: 07/13/2021 ? ? End of Session - 07/13/21 1604   ? ? Visit Number 1   ? Number of Visits 25   ? Date for SLP Re-Evaluation 10/11/21   ? Authorization Type [employer]   ? SLP Start Time 1100   ? SLP Stop Time  1200   ? SLP Time Calculation (min) 60 min   ? Activity Tolerance Patient tolerated treatment well   ? ?  ?  ? ?  ? ? ?Past Medical History:  ?Diagnosis Date  ? Abnormal ankle brachial index   ? Aortic atherosclerosis ([place])   ? Arthritis   ? hips and back  ? Asthma   ? as a baby-   ? Bronchiectasis without complication ([place])   ? CKD (chronic kidney disease) stage 3, GFR 30-59 ml/min (HCC)   ? Coronary artery calcification seen on CT scan   ? 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 ED (erectile dysfunction)   ? Eosinophilia   ? GERD (gastroesophageal reflux disease)   ? rare -   ? History of BPH   ? History of genital warts   ? History of solitary pulmonary nodule   ? Left and right lung nodules on CT in ER 1/20; stable by CT 2/21  ? Hypertriglyceridemia   ? Microscopic hematuria   ? Other chronic pain   ? Parkinson's disease ([place])   ? Pneumonia   ? age 19  ? Post-traumatic osteoarthritis of left knee   ? Rash   ? legs- on going  ? Seizure-like activity ([place])   ? Umbilical hernia   ? ? ?Past Surgical History:  ?Procedure Laterality Date  ? APPENDECTOMY    ? BACK SURGERY    ? posterior lumbar spine fusion L4-5  ? COLONOSCOPY    ? COLONOSCOPY WITH PROPOFOL N/A 06/06/2017  ? Procedure: COLONOSCOPY WITH PROPOFOL;  Surgeon: Manya Silvas, MD;  [facility] END[facility]COPY;  Service: Endoscopy;  Laterality: N/A;  ? KNEE ARTHROSCOPY Bilateral   ? ACL  ? KNEE ARTHROSCOPY W/ ACL  RECONSTRUCTION    ? RADIAL KERATOTOMY    ? ROTATOR CUFF REPAIR Bilateral   ? SHOULDER ARTHROSCOPY WITH BICEPS TENDON REPAIR Left 04/16/2019  ? Procedure: SHOULDER ARTHROSCOPY WITH BICEPS TENDON REPAIR, MINI OPEN SUPERIOR CAPSULAR RECONSTRUCTION, BICEPS TENODESIS;  Surgeon: Leim Fabry, MD;  Location: [facility] ORS;  Service: Orthopedics;  Laterality: Left;  ? ? ?There were no vitals filed for this visit. ? ? Subjective Assessment - 07/13/21 1544   ? ? Subjective "To get louder."   ? Currently in Pain? No/denies   ? ?  ?  ? ?  ? ? ? ? ? SLP Evaluation [facility] - 07/13/21 1544   ? ?  ? SLP Visit Information  ? SLP Received On 07/13/21   ? Referring Provider (SLP) Ramonita Lab III, MD   ? Onset Date --   PD dx 2011  ? Medical Diagnosis Parkinson's Disease   ?  ? Subjective  ? Patient/Family Stated Goal get louder   ?  ? General Information  ? HPI Cory [name] is a 66 y.o. male with past medical history of Parkinson's disease (dx 2011), asthma, GERD,  depression, focal seizures and migraine syndrome, falls, and esoinophilia. Known to this therapist from prior course of therapy for training in compensatory strategies to manage cognitive decline secondary to PD. Pt had 2 ED visits (07/06/21 and 07/07/21) for seizures; recently started on Keppra. Had a fall at home on 07/09/21. Pt referred for pt/caregiver training in compensatory strategies to manage cognitive decline related to PD. Pt returns for dysarthria evaluation with goal to improve his intelligibility.   ? Behavioral/Cognition alert, cooperative   ? Mobility Status ambulates with rollator   ?  ? Balance Screen  ? Has the patient fallen in the past 6 months Yes   ? How many times? several- last reported 07/09/21   ? Has the patient had a decrease in activity level because of a fear of falling?  No   ? Is the patient reluctant to leave their home because of a fear of falling?  No   ?  ? Prior Functional Status  ? Cognitive/Linguistic Baseline Baseline deficits   ? Baseline deficit  details slower processing, memory, attention deficits   ? Type of Home House   ?  Lives With Spouse   ? Available Support Family   ? Vocation Retired   ?  ? Pain Assessment  ? Pain Assessment No/denies pain   ? ?  ?  ? ?  ? ?Motor Speech Evaluation  ?  ? ?ORAL MOTOR ASSESSMENT:   ?Impaired: Reduced facial movement ? ?Alternating Motion Rate: 32 (30-35 repetitions/ 5 seconds average for /p/) ? ?Sequential Motion Rate: Rapid and Irregular ?  ?Respiration: Reduced breath support and Other: vocal fade due to diminished breath support. Did not pause for breath when counting 1-20 ? ?Phonation ?  ?Vocal quality: hoarseness, particularly near end of utterances ? ?Maximum phonation time for sustained ?ah?: 14.3 seconds ?  ?Average fundamental frequency during sustained ?ah?: 135   (within SD of average of  145 Hz +/- 23 for gender)  ?  ?Habitual pitch: 167 ?   ?Highest dynamic pitch in conversational speech: 178 Hz ?  ?Lowest dynamic pitch in conversational speech: 104 Hz ?  ?Average time patient was able to sustain /s/:  11 seconds ?  ?Average time patient was able to sustain /z/: 17 seconds ?  ?s/z ratio:  (suggestive of dysfunction > 1.0) 0.65 ? ?Resonance: WNL ? ?Articulation: ? ?Connected Speech characteristics: Imprecise consonants, Fast rate, Variable rate, Loudness variation, Monopitch, and Other: short rushes of speech ? ?Intelligibility: For trained listener with context in a quiet environment, intelligibility rated as approximately 85 % ? ?Non-verbal oral apraxia: Not present ?  ?The Communication Effectiveness Survey is a patient-reported outcome measure in which the patient rates their own effectiveness in different communication situations. A higher score indicates greater effectiveness. Pt's self-rating was 17/32. Pt estimated that his abilities were greater than his effectiveness in actuality over the past month and revised his answers when encouraged to think about this with encouragement from his  spouse. ? ? ? ? SLP Education - 07/13/21 1604   ? ? Education Details proposed therapy goals, plan of care, frequency of practice needed   ? Person(s) Educated Patient;Spouse   ? Methods Explanation   ? Comprehension Verbalized understanding   ? ?  ?  ? ?  ? ? ? SLP Short Term Goals - 07/13/21 1611   ? ?  ? SLP SHORT TERM GOAL #1  ? Title The patient will complete HEP for dysarthria at average loudness >/= 80 dB  and with loud, good quality voice and min cues.   ? Time 6   ? Period Weeks   ? Status New   ? Target Date 08/24/21   ?  ? SLP SHORT TERM GOAL #2  ? Title Patient will maintain average >/= 75 dB and good quality voice in structured sentence level tasks >90% with min cues.   ? Time 6   ? Period Weeks   ? Status New   ? Target Date 08/24/21   ?  ? SLP SHORT TERM GOAL #3  ? Title The patient will participate in 5-8 minutes conversation, maintaining average loudness of 75 dB and good quality voice with min cues.   ? Time 6   ? Period Weeks   ? Status New   ? Target Date 08/24/21   ? ?  ?  ? ?  ? ? ? SLP Long Term Goals - 07/13/21 1629   ? ?  ? SLP LONG TERM GOAL #1  ? Title The patient will complete HEP for dysarthria at average loudness >/= 80 dB and with loud, good quality voice.   ? Time 12   ? Period Weeks   ? Status New   ? Target Date 10/11/21   ?  ? SLP LONG TERM GOAL #2  ? Title The patient will maintain average >/= 75 dB and good quality voice in multi-paragraph reading tasks at average >/= 75 dB.   ? Time 12   ? Period Weeks   ? Status New   ? Target Date 10/11/21   ?  ? SLP LONG TERM GOAL #3  ? Title The patient will participate in 15 minutes conversation, maintaining average loudness of >/= 75 dB and good quality voice.   ? Time 12   ? Period Weeks   ? Status New   ? Target Date 10/11/21   ?  ? SLP LONG TERM GOAL #4  ? Title Patient will report improved communication effectiveness as measured by Communicative Effectiveness Survey.   ? Time 12   ? Period Weeks   ? Status New   ? Target Date 10/11/21    ? ?  ?  ? ?  ? ? ? Plan - 07/13/21 1605   ? ? Clinical Impression Statement Patient presents with moderate hypokinetic dysarthria characterized by reduced breath support, short rushes of speech, reduced pitch varia

## 2021-07-13 NOTE — Telephone Encounter (Signed)
BMB scheduled for 4/18 @ 8:30 am, patient to arrive by 7:30 am. Patient informed ?

## 2021-07-15 ENCOUNTER — Ambulatory Visit: Payer: Medicare PPO

## 2021-07-15 ENCOUNTER — Ambulatory Visit: Payer: Medicare PPO | Admitting: Speech Pathology

## 2021-07-15 DIAGNOSIS — R471 Dysarthria and anarthria: Secondary | ICD-10-CM | POA: Diagnosis not present

## 2021-07-15 DIAGNOSIS — G20 Parkinson's disease: Secondary | ICD-10-CM

## 2021-07-15 NOTE — Therapy (Signed)
[facility] ?[place], [place], [address] ?Phone: [phone]   Fax:  [phone] ? ?Speech Language Pathology Treatment ? ?Patient Details  ?Name: Cory [name] ?MRN: [id number] ?Date of Birth: 03-Mar-1956 ?Referring Provider (SLP): Ramonita Lab III, MD ? ? ?Encounter Date: 07/15/2021 ? ? End of Session - 07/15/21 1218   ? ? Visit Number 2   ? Number of Visits 25   ? Date for SLP Re-Evaluation 10/11/21   ? Authorization Type [employer]   ? SLP Start Time 1005   ? SLP Stop Time  1100   ? SLP Time Calculation (min) 55 min   ? Activity Tolerance Patient tolerated treatment well   ? ?  ?  ? ?  ? ? ?Past Medical History:  ?Diagnosis Date  ? Abnormal ankle brachial index   ? Aortic atherosclerosis ([place])   ? Arthritis   ? hips and back  ? Asthma   ? as a baby-   ? Bronchiectasis without complication ([place])   ? CKD (chronic kidney disease) stage 3, GFR 30-59 ml/min (HCC)   ? Coronary artery calcification seen on CT scan   ? 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 ED (erectile dysfunction)   ? Eosinophilia   ? GERD (gastroesophageal reflux disease)   ? rare -   ? History of BPH   ? History of genital warts   ? History of solitary pulmonary nodule   ? Left and right lung nodules on CT in ER 1/20; stable by CT 2/21  ? Hypertriglyceridemia   ? Microscopic hematuria   ? Other chronic pain   ? Parkinson's disease ([place])   ? Pneumonia   ? age 66  ? Post-traumatic osteoarthritis of left knee   ? Rash   ? legs- on going  ? Seizure-like activity ([place])   ? Umbilical hernia   ? ? ?Past Surgical History:  ?Procedure Laterality Date  ? APPENDECTOMY    ? BACK SURGERY    ? posterior lumbar spine fusion L4-5  ? COLONOSCOPY    ? COLONOSCOPY WITH PROPOFOL N/A 06/06/2017  ? Procedure: COLONOSCOPY WITH PROPOFOL;  Surgeon: Manya Silvas, MD;  Location: [facility] ENDOSCOPY;  Service: Endoscopy;  Laterality: N/A;  ? KNEE ARTHROSCOPY Bilateral   ? ACL  ? KNEE ARTHROSCOPY W/ ACL  RECONSTRUCTION    ? RADIAL KERATOTOMY    ? ROTATOR CUFF REPAIR Bilateral   ? SHOULDER ARTHROSCOPY WITH BICEPS TENDON REPAIR Left 04/16/2019  ? Procedure: SHOULDER ARTHROSCOPY WITH BICEPS TENDON REPAIR, MINI OPEN SUPERIOR CAPSULAR RECONSTRUCTION, BICEPS TENODESIS;  Surgeon: Leim Fabry, MD;  Location: [facility] ORS;  Service: Orthopedics;  Laterality: Left;  ? ? ?There were no vitals filed for this visit. ? ? Subjective Assessment - 07/15/21 1010   ? ? Subjective Pt ran into chair with walker   ? Currently in Pain? Yes   ? Pain Score 3    intermittent  ? Pain Location Neck   ? ?  ?  ? ?  ? ? ? ? ? ? ? ? ADULT SLP TREATMENT - 07/15/21 1157   ? ?  ? General Information  ? Behavior/Cognition Alert;Cooperative   ? Patient Positioning Upright in chair   ? HPI Cory [name] is a 66 y.o. male with past medical history of Parkinson's disease (dx 2011), asthma, GERD, depression, focal seizures and migraine syndrome, falls, and esoinophilia. Known to this therapist from prior course of therapy for training  in compensatory strategies to manage cognitive decline secondary to PD. Pt had 2 ED visits (07/06/21 and 07/07/21) for seizures; recently started on Keppra. Had a fall at home on 07/09/21. Pt referred for pt/caregiver training in compensatory strategies to manage cognitive decline related to PD. Pt returns for dysarthria evaluation with goal to improve his intelligibility.   ?  ? Treatment Provided  ? Treatment provided Cognitive-Linquistic   ?  ? Cognitive-Linquistic Treatment  ? Treatment focused on Dysarthria;Patient/family/caregiver education   ? Skilled Treatment Initiated training in HEP for dysarthria. Worked with pt to generate list of 10 personally relevant phrases (usual mod cues for attention to task). For loud /a/ pt required usual mod-max cues initially for shaping pitch and loudness and to avoid "singing" (pitch ranging 220-'270Hz'$ , initially). Benefitted from cues to "say ah like for the doctor" to produce more normative  pitch vs specific instruction for lower pitch. Used multiple repetitions, with ability to fade cues to occasional mod A by end of task, with avg duration 7 seconds, loudness 78 dB. Pt used same effort level with phrases with mod cues, average 76 dB, however short rushes of speech degraded intelligibility. Utilized pacing by having pt tap dot for each word which improved rate significantly. Initial usual mod cues fading to min A for phrases up to 7 syllables in length. Created visual for pt to practice at home.   ?  ? Assessment / Recommendations / Plan  ? Plan Continue with current plan of care   ?  ? Progression Toward Goals  ? Progression toward goals Progressing toward goals   ? ?  ?  ? ?  ? ? ? ? ? SLP Short Term Goals - 07/13/21 1611   ? ?  ? SLP SHORT TERM GOAL #1  ? Title The patient will complete HEP for dysarthria at average loudness >/= 80 dB and with loud, good quality voice and min cues.   ? Time 6   ? Period Weeks   ? Status New   ? Target Date 08/24/21   ?  ? SLP SHORT TERM GOAL #2  ? Title Patient will maintain average >/= 75 dB and good quality voice in structured sentence level tasks >90% with min cues.   ? Time 6   ? Period Weeks   ? Status New   ? Target Date 08/24/21   ?  ? SLP SHORT TERM GOAL #3  ? Title The patient will participate in 5-8 minutes conversation, maintaining average loudness of 75 dB and good quality voice with min cues.   ? Time 6   ? Period Weeks   ? Status New   ? Target Date 08/24/21   ? ?  ?  ? ?  ? ? ? SLP Long Term Goals - 07/13/21 1629   ? ?  ? SLP LONG TERM GOAL #1  ? Title The patient will complete HEP for dysarthria at average loudness >/= 80 dB and with loud, good quality voice.   ? Time 12   ? Period Weeks   ? Status New   ? Target Date 10/11/21   ?  ? SLP LONG TERM GOAL #2  ? Title The patient will maintain average >/= 75 dB and good quality voice in multi-paragraph reading tasks at average >/= 75 dB.   ? Time 12   ? Period Weeks   ? Status New   ? Target Date  10/11/21   ?  ? SLP LONG TERM GOAL #  3  ? Title The patient will participate in 15 minutes conversation, maintaining average loudness of >/= 75 dB and good quality voice.   ? Time 12   ? Period Weeks   ? Status New   ? Target Date 10/11/21   ?  ? SLP LONG TERM GOAL #4  ? Title Patient will report improved communication effectiveness as measured by Communicative Effectiveness Survey.   ? Time 12   ? Period Weeks   ? Status New   ? Target Date 10/11/21   ? ?  ?  ? ?  ? ? ? Plan - 07/15/21 1219   ? ? Clinical Impression Statement Patient presents with moderate hypokinetic dysarthria characterized by reduced breath support, short rushes of speech, reduced pitch variability and reduced vocal intensity. Rapid rate has greatest impact on intelligibility. Patient stimulable for improved intelligibility and pitch variability with cues for increased loudness and use of pacing to slow rate. I recommend skilled ST to improve loudness and intelligibility for conversations with friends and family.   ? Speech Therapy Frequency 2x / week   ? Duration 12 weeks   ? Treatment/Interventions SLP instruction and feedback;Language facilitation;Functional tasks;Patient/family education   intensity-based intervention for dysarthria, pacing  ? Potential to Achieve Goals Fair   -good  ? Potential Considerations Ability to learn/carryover information;Severity of impairments;Previous level of function   ? SLP Home Exercise Plan to be provided next session   ? Consulted and Agree with Plan of Care Patient;Family member/caregiver   ? Family Member Consulted Wife   ? ?  ?  ? ?  ? ? ?Patient will benefit from skilled therapeutic intervention in order to improve the following deficits and impairments:   ?Dysarthria and anarthria ? ?Parkinson's disease ([place]) ? ? ? ?Problem List ?Patient Active Problem List  ? Diagnosis Date Noted  ? Other eosinophilia 05/26/2021  ? Elevated serum immunoglobulin free light chain level 05/26/2021  ? Liver lesion  05/26/2021  ? Eosinophilia 04/27/2021  ? 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 Thoracic aortic aneurysm ([place]) 04/21/2021  ? Focal seizure ([place]) 05/24/2020  ? Parkinson's disease ([place])   ? Hist

## 2021-07-15 NOTE — Patient Instructions (Signed)
Home exercises ? ?1) Say "ah" with your loud, good quality voice (use your abs). Aim for effort level 7 or 8. ? ? 10 times ? ?2) Read aloud from your list of 10 phrases in your loud, good quality voice.  ? ?Morning, honey. ? ?2. What's for supper? ? ?3. Nevermind, I'll eat it anyway. ? ?4. Who got my truck dirty? ? ?5. Have you given me my medicine? ? ?6. Did I take my carbo at 4:30? ? ?7. Do you know the password to my laptop? ? ?8. Where did you put my water can? ? ?9. Is Zeke in or out? ? ?10. Would you please let my dog in? ? ? ?3) Read out loud for 10 minutes. LOUD and slow! ? ?

## 2021-07-17 NOTE — Progress Notes (Signed)
Spoke with patient's wife 07/17/21 at 10:15 am. Cory [name] over pre-procedure instructions to include the need to arrive at 7:30 for 8:30 procedure, need to be NPO after midnight, need to hold aspirin morning of procedure and need for driver post procedure. Patient's wife verbalized understanding. ?

## 2021-07-20 ENCOUNTER — Ambulatory Visit: Payer: Medicare PPO

## 2021-07-20 ENCOUNTER — Other Ambulatory Visit (HOSPITAL_COMMUNITY): Payer: Self-pay | Admitting: Radiology

## 2021-07-20 ENCOUNTER — Ambulatory Visit: Payer: Medicare PPO | Admitting: Speech Pathology

## 2021-07-21 ENCOUNTER — Ambulatory Visit
Admission: RE | Admit: 2021-07-21 | Discharge: 2021-07-21 | Disposition: A | Discharge status: Home / Self Care | Payer: Medicare PPO | Source: Ambulatory Visit | Attending: Oncology | Admitting: Oncology

## 2021-07-21 ENCOUNTER — Encounter (INDEPENDENT_AMBULATORY_CARE_PROVIDER_SITE_OTHER): Payer: Medicare PPO

## 2021-07-21 ENCOUNTER — Other Ambulatory Visit: Payer: Self-pay

## 2021-07-21 ENCOUNTER — Ambulatory Visit (INDEPENDENT_AMBULATORY_CARE_PROVIDER_SITE_OTHER): Payer: Medicare PPO | Admitting: Vascular Surgery

## 2021-07-21 DIAGNOSIS — R898 Other abnormal findings in specimens from other organs, systems and tissues: Secondary | ICD-10-CM | POA: Insufficient documentation

## 2021-07-21 DIAGNOSIS — D721 Eosinophilia, unspecified: Secondary | ICD-10-CM | POA: Diagnosis not present

## 2021-07-21 DIAGNOSIS — G4089 Other seizures: Secondary | ICD-10-CM | POA: Insufficient documentation

## 2021-07-21 DIAGNOSIS — I6782 Cerebral ischemia: Secondary | ICD-10-CM | POA: Insufficient documentation

## 2021-07-21 DIAGNOSIS — I951 Orthostatic hypotension: Secondary | ICD-10-CM | POA: Insufficient documentation

## 2021-07-21 DIAGNOSIS — H547 Unspecified visual loss: Secondary | ICD-10-CM | POA: Insufficient documentation

## 2021-07-21 DIAGNOSIS — D7589 Other specified diseases of blood and blood-forming organs: Secondary | ICD-10-CM | POA: Diagnosis not present

## 2021-07-21 DIAGNOSIS — F17210 Nicotine dependence, cigarettes, uncomplicated: Secondary | ICD-10-CM | POA: Insufficient documentation

## 2021-07-21 DIAGNOSIS — Q998 Other specified chromosome abnormalities: Secondary | ICD-10-CM | POA: Insufficient documentation

## 2021-07-21 DIAGNOSIS — D7219 Other eosinophilia: Secondary | ICD-10-CM

## 2021-07-21 DIAGNOSIS — G20 Parkinson's disease: Secondary | ICD-10-CM | POA: Diagnosis not present

## 2021-07-21 DIAGNOSIS — J45909 Unspecified asthma, uncomplicated: Secondary | ICD-10-CM | POA: Diagnosis not present

## 2021-07-21 DIAGNOSIS — R531 Weakness: Secondary | ICD-10-CM | POA: Diagnosis not present

## 2021-07-21 DIAGNOSIS — R5383 Other fatigue: Secondary | ICD-10-CM | POA: Insufficient documentation

## 2021-07-21 LAB — CBC WITH DIFFERENTIAL/PLATELET
Abs Immature Granulocytes: 0.05 10*3/uL (ref 0.00–0.07)
Basophils Absolute: 0.1 10*3/uL (ref 0.0–0.1)
Basophils Relative: 1 %
Eosinophils Absolute: 9.8 10*3/uL — ABNORMAL HIGH (ref 0.0–0.5)
Eosinophils Relative: 51 %
HCT: 42.7 % (ref 39.0–52.0)
Hemoglobin: 13.9 g/dL (ref 13.0–17.0)
Immature Granulocytes: 0 %
Lymphocytes Relative: 9 %
Lymphs Abs: 1.7 10*3/uL (ref 0.7–4.0)
MCH: 28.5 pg (ref 26.0–34.0)
MCHC: 32.6 g/dL (ref 30.0–36.0)
MCV: 87.7 fL (ref 80.0–100.0)
Monocytes Absolute: 1.1 10*3/uL — ABNORMAL HIGH (ref 0.1–1.0)
Monocytes Relative: 6 %
Neutro Abs: 6.3 10*3/uL (ref 1.7–7.7)
Neutrophils Relative %: 33 %
Platelets: 242 10*3/uL (ref 150–400)
RBC: 4.87 MIL/uL (ref 4.22–5.81)
RDW: 13.6 % (ref 11.5–15.5)
Smear Review: NORMAL
WBC: 19 10*3/uL — ABNORMAL HIGH (ref 4.0–10.5)
nRBC: 0 % (ref 0.0–0.2)

## 2021-07-21 MED ORDER — FENTANYL CITRATE (PF) 100 MCG/2ML IJ SOLN
INTRAMUSCULAR | Status: AC
  Filled 2021-07-21: qty 2

## 2021-07-21 MED ORDER — MIDAZOLAM HCL 2 MG/2ML IJ SOLN
INTRAMUSCULAR | Status: AC | PRN
  Administered 2021-07-21: 1 mg via INTRAVENOUS
  Administered 2021-07-21: .5 mg via INTRAVENOUS

## 2021-07-21 MED ORDER — SODIUM CHLORIDE 0.9 % IV SOLN: INTRAVENOUS | Status: DC

## 2021-07-21 MED ORDER — FENTANYL CITRATE (PF) 100 MCG/2ML IJ SOLN
INTRAMUSCULAR | Status: AC | PRN
  Administered 2021-07-21: 25 ug via INTRAVENOUS
  Administered 2021-07-21: 50 ug via INTRAVENOUS

## 2021-07-21 MED ORDER — MIDAZOLAM HCL 2 MG/2ML IJ SOLN
INTRAMUSCULAR | Status: AC
  Filled 2021-07-21: qty 2

## 2021-07-21 MED ORDER — HEPARIN SOD (PORK) LOCK FLUSH 100 UNIT/ML IV SOLN
INTRAVENOUS | Status: AC
  Filled 2021-07-21: qty 5

## 2021-07-21 NOTE — Procedures (Signed)
Pre-procedure Diagnosis: Chronic eosinophilia and chronic basophilia ?Post-procedure Diagnosis: Same ? ?Technically successful CT guided bone marrow aspiration and biopsy of left iliac crest.  ? ?Complications: None Immediate ? ?EBL: None ? ?Signed: ?Sandi Mariscal ?Pager: [phone] ?07/21/2021, 9:23 AM ? ? ? ?

## 2021-07-21 NOTE — H&P (Signed)
? ?Chief Complaint: ?Patient was seen in consultation today for Bone marrow biopsy at the request of Yu,Zhou ? ?Referring Physician(s): ?Yu,Zhou ? ?Supervising Physician: Sandi Mariscal ? ?Patient Status: [place] ? ?History of Present Illness: ?Cory [name] is a 66 y.o. male  ? ?Hx Parkinson's dz; asthma; orthostatic hypotension ?Chronic eosinophilia and chronic basophilia ?Progressive disease; seizure activity reported- vision impairment and word difficulties ?Follows with Dr Tasia Catchings ? ?Dr Tasia Catchings note 07/10/21: ?#Chronic eosinophilia, progressively worsening ?Etiology unknown.  Could be secondary to acute/chronic inflammation, smoking, drug-induced, or bone marrow malignancies. ?Today's CBC showed absolute eosinophilia is above 1500, will proceed with additional work-up with HES  ?Normal tryptase, B12, peripheral flowcytometry showed marked eosinophilia. There is no immunophenotypic  evidence of abnormal myeloid maturation ?Negative EBV DNA, , Negative MPN/HES panel [ PDGFRA,  PDGFRB, FGFR1 normal],  JAK2 V617F mutation negative, with reflex to other mutations CALR, MPL, JAK 2 Ex 12-15 mutations negative, negative BCR ABL1 FISH,  negative ANA, CRP,  ?Slight increase of serum free light chain ration, 24 hour UPEP showed no M protein. negative hepatitis panel,  ?Previously drug induced eosinophilia is considered and lamotrigin was felt to be a cause.  ?Despite being off lamotrigine, wbc/eosinophilia continues to increase.  ?Recommend bone marrow biopsy for further evaluation ? ?Scheduled now for same ? ? ?Past Surgical History:  ?Procedure Laterality Date  ? APPENDECTOMY    ? BACK SURGERY    ? posterior lumbar spine fusion L4-5  ? COLONOSCOPY    ? COLONOSCOPY WITH PROPOFOL N/A 06/06/2017  ? Procedure: COLONOSCOPY WITH PROPOFOL;  Surgeon: Manya Silvas, MD;  Location: [facility] ENDOSCOPY;  Service: Endoscopy;  Laterality: N/A;  ? KNEE ARTHROSCOPY Bilateral   ? ACL  ? KNEE ARTHROSCOPY W/ ACL RECONSTRUCTION    ? RADIAL  KERATOTOMY    ? ROTATOR CUFF REPAIR Bilateral   ? SHOULDER ARTHROSCOPY WITH BICEPS TENDON REPAIR Left 04/16/2019  ? Procedure: SHOULDER ARTHROSCOPY WITH BICEPS TENDON REPAIR, MINI OPEN SUPERIOR CAPSULAR RECONSTRUCTION, BICEPS TENODESIS;  Surgeon: Leim Fabry, MD;  Location: [facility] ORS;  Service: Orthopedics;  Laterality: Left;  ? ? ?Allergies: ?Patient has no known allergies. ? ?Medications: ?Prior to Admission medications   ?Medication Sig Start Date End Date Taking? Authorizing Provider  ?Amantadine HCl 100 MG tablet Take 100 mg by mouth 3 (three) times daily.   Yes [provider]  ?amoxicillin (AMOXIL) 875 MG tablet Take 875 mg by mouth 2 (two) times daily.   Yes [provider]  ?aspirin EC 81 MG tablet Take 81 mg by mouth daily. Swallow whole.   Yes [provider]  ?carbidopa-levodopa (SINEMET CR) 50-200 MG tablet Take 1 tablet by mouth at bedtime.   Yes [provider]  ?carbidopa-levodopa (SINEMET IR) 25-100 MG tablet Take 2 tablets by mouth 3 (three) times daily.    Yes [provider]  ?cyanocobalamin 1000 MCG tablet Take 1 tablet (1,000 mcg total) by mouth daily. 05/25/20  Yes Jennye Boroughs, MD  ?entacapone (COMTAN) 200 MG tablet Take 200 mg by mouth 3 (three) times daily. 04/17/20  Yes [provider]  ?fluticasone-salmeterol (ADVAIR) 250-50 MCG/ACT AEPB Inhale into the lungs. 02/24/21  Yes [provider]  ?levETIRAcetam (KEPPRA) 500 MG tablet Take 1 tablet by mouth 2 (two) times daily. 07/08/21 07/08/22 Yes [provider]  ?memantine (NAMENDA) 5 MG tablet Take 5 mg by mouth daily.   Yes [provider]  ?midodrine (PROAMATINE) 2.5 MG tablet Take 2.5-5 mg by mouth See admin  instructions. Take 90m at breakfast (as needed), 2.538mat lunch (as needed) and take 2.46m48mt supper (as needed) for low blood pressure 01/28/20  Yes [provider]  ?MYRBETRIQ 50 MG TB24 tablet Take 50 mg by mouth at bedtime. 03/06/20  Yes [provider]  ?pravastatin (PRAVACHOL) 10 MG tablet Take 10 mg by mouth daily. 06/23/21  Yes [provider]  ?sertraline (ZOLOFT) 50 MG tablet Take 50 mg by mouth daily.   Yes [provider]  ?tamsulosin (FLOMAX) 0.4 MG CAPS capsule Take 1 capsule (0.4 mg total) by mouth daily after supper. 04/08/18  Yes RobMerlyn LotD  ?tiZANidine (ZANAFLEX) 4 MG tablet Take 4 mg by mouth daily as needed for muscle spasms.    Yes [provider]  ?azelastine (ASTELIN) 0.1 % nasal spray Place 1-2 sprays into both nostrils as directed. ?Patient not taking: Reported on 07/06/2021 03/27/20   [provider]  ?fludrocortisone (FLORINEF) 0.1 MG tablet Take 100 mcg by mouth every evening. ?Patient not taking: Reported on 04/27/2021 04/01/20   [provider]  ?Fluticasone-Umeclidin-Vilant (TRELEGY ELLIPTA) 100-62.5-25 MCG/INH AEPB Inhale 1 puff into the lungs daily as needed. 06/15/19   [provider]  ?levETIRAcetam (KEPPRA) 250 MG tablet Take 1 tablet (250 mg total) by mouth 2 (two) times daily. ?Patient not taking: Reported on 07/10/2021 07/06/21 09/04/21  JesBlake DivineD  ?Multiple Vitamin (MULTIVITAMIN WITH MINERALS) TABS tablet Take 1 tablet by mouth daily. ?Patient not taking: Reported on 07/06/2021 05/25/20   AyiJennye BoroughsD  ?  ? ?Family History  ?Problem Relation Age of Onset  ? Hypertension Mother   ? Breast cancer Mother   ? Lung cancer Father   ? Breast cancer Sister   ? ? ?Social History  ? ?Socioeconomic History  ? Marital status: Married  ?  Spouse name: Not on file  ? Number of children: Not on file  ? Years of education: Not on file  ? Highest education level: Not on file  ?Occupational History  ? Not on file  ?Tobacco Use  ? Smoking status: Some Days  ?  Packs/day: 0.25  ?  Years: 23.00  ?  Pack years: 5.75  ?  Types: Cigarettes, Cigars  ? Smokeless tobacco: Never  ? Tobacco comments:  ?  smokes 1 cigar per day 04/30/2016  ?Vaping Use  ? Vaping Use: Never used   ?Substance and Sexual Activity  ? Alcohol use: Not Currently  ?  Alcohol/week: 1.0 standard drink  ?  Types: 1 Shots of liquor per week  ?  Comment: has not had in 2 years  ? Drug use: No  ? Sexual activity: Not on file  ?Other Topics Concern  ? Not on file  ?Social History Narrative  ? Not on file  ? ?Social Determinants of Health  ? ?Financial Resource Strain: Not on file  ?Food Insecurity: Not on file  ?Transportation Needs: Not on file  ?Physical Activity: Not on file  ?Stress: Not on file  ?Social Connections: Not on file  ? ? ?Review of Systems: A 12 point ROS discussed and pertinent positives are indicated in the HPI above.  All other systems are negative. ? ?Review of Systems  ?Constitutional:  Positive for activity change and fatigue. Negative for fever and unexpected weight change.  ?Respiratory:  Negative for cough and shortness of breath.   ?Cardiovascular:  Negative for chest pain.  ?Gastrointestinal:  Negative for abdominal pain.  ?Musculoskeletal:  Negative for back pain.  ?  Neurological:  Positive for weakness.  ? ?Vital Signs: ?BP (!) 141/84   Pulse 74   Temp 97.9 ?F (36.6 ?C) (Oral)   Ht 5' 8"  (1.727 m)   Wt 155 lb (70.3 kg)   SpO2 93%   BMI 23.57 kg/m?  ? ?Physical Exam ?Vitals reviewed.  ?HENT:  ?   Mouth/Throat:  ?   Mouth: Mucous membranes are moist.  ?Cardiovascular:  ?   Rate and Rhythm: Normal rate and regular rhythm.  ?   Heart sounds: Normal heart sounds.  ?Pulmonary:  ?   Effort: Pulmonary effort is normal.  ?   Breath sounds: Normal breath sounds.  ?Abdominal:  ?   Palpations: Abdomen is soft.  ?Musculoskeletal:     ?   General: Normal range of motion.  ?Skin: ?   General: Skin is warm.  ?Neurological:  ?   Mental Status: He is alert and oriented to person, place, and time.  ?Psychiatric:     ?   Behavior: Behavior normal.  ? ? ?Imaging: ?MR BRAIN WO CONTRAST ? ?Result Date: 07/06/2021 ?CLINICAL DATA:  Stroke versus seizure EXAM: MRI HEAD WITHOUT CONTRAST TECHNIQUE: Multiplanar,  multiecho pulse sequences of the brain and surrounding structures were obtained without intravenous contrast. COMPARISON:  MRI brain 05/24/2020, correlation is also made with CT head 07/06/2021 FINDINGS: Brain: No re

## 2021-07-22 ENCOUNTER — Ambulatory Visit: Payer: Medicare PPO

## 2021-07-22 ENCOUNTER — Telehealth: Payer: Self-pay

## 2021-07-22 ENCOUNTER — Ambulatory Visit: Payer: Medicare PPO | Admitting: Speech Pathology

## 2021-07-22 NOTE — Telephone Encounter (Signed)
Pt had BM biopsy on 4/18, please schedule MD only follow up approx 2 weeks after biopsy to go over resutls. Please inform pt of appt details.  ? ?Keep other appts in sept as scheduled.  ?

## 2021-07-23 LAB — SURGICAL PATHOLOGY

## 2021-07-27 ENCOUNTER — Ambulatory Visit: Payer: Medicare PPO | Admitting: Speech Pathology

## 2021-07-27 ENCOUNTER — Ambulatory Visit: Payer: Medicare PPO

## 2021-07-27 DIAGNOSIS — G20 Parkinson's disease: Secondary | ICD-10-CM

## 2021-07-27 DIAGNOSIS — R471 Dysarthria and anarthria: Secondary | ICD-10-CM

## 2021-07-27 DIAGNOSIS — G20A1 Parkinson's disease without dyskinesia, without mention of fluctuations: Secondary | ICD-10-CM

## 2021-07-27 NOTE — Therapy (Signed)
[place] [facility] ?[place], [place], [address] ?Phone: [phone]   Fax:  [phone] ? ?Speech Language Pathology Treatment ? ?Patient Details  ?Name: Cory [name] ?MRN: [id number] ?Date of Birth: 11-29-1955 ?Referring Provider (SLP): Ramonita Lab III, MD ? ? ?Encounter Date: 07/27/2021 ? ? End of Session - 07/27/21 1351   ? ? Visit Number 3   ? Number of Visits 25   ? Date for SLP Re-Evaluation 10/11/21   ? Authorization Type [employer]   ? SLP Start Time 1100   ? SLP Stop Time  1200   ? SLP Time Calculation (min) 60 min   ? Activity Tolerance Patient tolerated treatment well   ? ?  ?  ? ?  ? ? ?Past Medical History:  ?Diagnosis Date  ? Abnormal ankle brachial index   ? Aortic atherosclerosis ([place])   ? Arthritis   ? hips and back  ? Asthma   ? as a baby-   ? Bronchiectasis without complication ([place])   ? CKD (chronic kidney disease) stage 3, GFR 30-59 ml/min (HCC)   ? Coronary artery calcification seen on CT scan   ? 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 ED (erectile dysfunction)   ? Eosinophilia   ? GERD (gastroesophageal reflux disease)   ? rare -   ? History of BPH   ? History of genital warts   ? History of solitary pulmonary nodule   ? Left and right lung nodules on CT in ER 1/20; stable by CT 2/21  ? Hypertriglyceridemia   ? Microscopic hematuria   ? Other chronic pain   ? Parkinson's disease ([place])   ? Pneumonia   ? age 62  ? Post-traumatic osteoarthritis of left knee   ? Rash   ? legs- on going  ? Seizure-like activity ([place])   ? Umbilical hernia   ? ? ?Past Surgical History:  ?Procedure Laterality Date  ? APPENDECTOMY    ? BACK SURGERY    ? posterior lumbar spine fusion L4-5  ? COLONOSCOPY    ? COLONOSCOPY WITH PROPOFOL N/A 06/06/2017  ? Procedure: COLONOSCOPY WITH PROPOFOL;  Surgeon: Manya Silvas, MD;  Location: [facility] ENDOSCOPY;  Service: Endoscopy;  Laterality: N/A;  ? KNEE ARTHROSCOPY Bilateral   ? ACL  ? KNEE ARTHROSCOPY W/ ACL  RECONSTRUCTION    ? RADIAL KERATOTOMY    ? ROTATOR CUFF REPAIR Bilateral   ? SHOULDER ARTHROSCOPY WITH BICEPS TENDON REPAIR Left 04/16/2019  ? Procedure: SHOULDER ARTHROSCOPY WITH BICEPS TENDON REPAIR, MINI OPEN SUPERIOR CAPSULAR RECONSTRUCTION, BICEPS TENODESIS;  Surgeon: Leim Fabry, MD;  Location: [facility] ORS;  Service: Orthopedics;  Laterality: Left;  ? ? ?There were no vitals filed for this visit. ? ? Subjective Assessment - 07/27/21 1333   ? ? Subjective Pt had bone marrow biopsy last week, awaiting results   ? Patient is accompained by: Family member   ? Currently in Pain? No/denies   ? ?  ?  ? ?  ? ? ? ? ? ? ? ? ADULT SLP TREATMENT - 07/27/21 1338   ? ?  ? General Information  ? Behavior/Cognition Alert;Cooperative   ? HPI Cory [name] is a 66 y.o. male with past medical history of Parkinson's disease (dx 2011), asthma, GERD, depression, focal seizures and migraine syndrome, falls, and esoinophilia. Known to this therapist from prior course of therapy for training in compensatory strategies to manage cognitive decline secondary to PD. Pt  had 2 ED visits (07/06/21 and 07/07/21) for seizures; recently started on Keppra. Had a fall at home on 07/09/21. Pt referred for pt/caregiver training in compensatory strategies to manage cognitive decline related to PD. Pt returns for dysarthria evaluation with goal to improve his intelligibility.   ?  ? Treatment Provided  ? Treatment provided Cognitive-Linquistic   ?  ? Cognitive-Linquistic Treatment  ? Treatment focused on Dysarthria;Patient/family/caregiver education   ? Skilled Treatment Pt/spouse report extreme fatigue since last ST session on 07/15/21. Had bone marrow biopsy last week and has EEG planned for next week. Pt hasn't been able to practice at home. Pt recalled pacing and "tapping" targeted in previous session, and was able to read his 10 phrases with initial moderate verbal, gestural and demonstration cues for pacing, able to fade support to SBA. Progressed to  short question and and answer; pt read questions using pacing board 95% of the time for 100% intelligibility, and generated plausible response using pacing board 90% the time, improved to 100% with rare min A. Progressed to simple conversation with occasional min cues. Spouse present for session and demonstrated appropriate modeling and began to initiate cuing. Overall intelligibility improves from 85% to 100% with pacing technique.   ?  ? Assessment / Recommendations / Plan  ? Plan Continue with current plan of care   ?  ? Progression Toward Goals  ? Progression toward goals Progressing toward goals   ? ?  ?  ? ?  ? ? ? SLP Education - 07/27/21 1350   ? ? Education Details pacing   ? Person(s) Educated Patient;Spouse   ? Methods Explanation   ? Comprehension Verbalized understanding   ? ?  ?  ? ?  ? ? ? SLP Short Term Goals - 07/13/21 1611   ? ?  ? SLP SHORT TERM GOAL #1  ? Title The patient will complete HEP for dysarthria at average loudness >/= 80 dB and with loud, good quality voice and min cues.   ? Time 6   ? Period Weeks   ? Status New   ? Target Date 08/24/21   ?  ? SLP SHORT TERM GOAL #2  ? Title Patient will maintain average >/= 75 dB and good quality voice in structured sentence level tasks >90% with min cues.   ? Time 6   ? Period Weeks   ? Status New   ? Target Date 08/24/21   ?  ? SLP SHORT TERM GOAL #3  ? Title The patient will participate in 5-8 minutes conversation, maintaining average loudness of 75 dB and good quality voice with min cues.   ? Time 6   ? Period Weeks   ? Status New   ? Target Date 08/24/21   ? ?  ?  ? ?  ? ? ? SLP Long Term Goals - 07/13/21 1629   ? ?  ? SLP LONG TERM GOAL #1  ? Title The patient will complete HEP for dysarthria at average loudness >/= 80 dB and with loud, good quality voice.   ? Time 12   ? Period Weeks   ? Status New   ? Target Date 10/11/21   ?  ? SLP LONG TERM GOAL #2  ? Title The patient will maintain average >/= 75 dB and good quality voice in  multi-paragraph reading tasks at average >/= 75 dB.   ? Time 12   ? Period Weeks   ? Status New   ?  Target Date 10/11/21   ?  ? SLP LONG TERM GOAL #3  ? Title The patient will participate in 15 minutes conversation, maintaining average loudness of >/= 75 dB and good quality voice.   ? Time 12   ? Period Weeks   ? Status New   ? Target Date 10/11/21   ?  ? SLP LONG TERM GOAL #4  ? Title Patient will report improved communication effectiveness as measured by Communicative Effectiveness Survey.   ? Time 12   ? Period Weeks   ? Status New   ? Target Date 10/11/21   ? ?  ?  ? ?  ? ? ? Plan - 07/27/21 1351   ? ? Clinical Impression Statement Patient presents with moderate hypokinetic dysarthria characterized by reduced breath support, short rushes of speech, reduced pitch variability and reduced vocal intensity. Rapid rate has greatest impact on intelligibility. Patient stimulable for improved intelligibility and pitch variability with cues for increased loudness and use of pacing to slow rate. Demonstrated ability to use pacing in sentence level tasks, with some carryover to short conversation with occasional min cues (however minimal eye contact). I recommend skilled ST to improve loudness and intelligibility for conversations with friends and family.   ? Speech Therapy Frequency 2x / week   ? Duration 12 weeks   ? Treatment/Interventions SLP instruction and feedback;Language facilitation;Functional tasks;Patient/family education   intensity-based intervention for dysarthria, pacing  ? Potential to Achieve Goals Fair   -good  ? Potential Considerations Ability to learn/carryover information;Severity of impairments;Previous level of function   ? SLP Home Exercise Plan to be provided next session   ? Consulted and Agree with Plan of Care Patient;Family member/caregiver   ? Family Member Consulted Wife   ? ?  ?  ? ?  ? ? ?Patient will benefit from skilled therapeutic intervention in order to improve the following deficits  and impairments:   ?Dysarthria and anarthria ? ?Parkinson's disease ([place]) ? ? ? ?Problem List ?Patient Active Problem List  ? Diagnosis Date Noted  ? Other eosinophilia 05/26/2021  ? Elevated serum immunoglobulin free li

## 2021-07-28 ENCOUNTER — Encounter (HOSPITAL_COMMUNITY): Payer: Self-pay | Admitting: Oncology

## 2021-07-28 ENCOUNTER — Telehealth: Payer: Self-pay | Admitting: *Deleted

## 2021-07-28 NOTE — Telephone Encounter (Signed)
Wife called reporting that patient has been having problems with his b/p being low and spoke with patient cardiologist regarding this and patient has medicine for this, but cardiologist told her that he thinks part of patient problem is related to his WBC She is asking if results are back yet and if so, could patient come in sooner that his 5/3 appointment. I do not see any ancillary test results in his chart, only a final edited path report. If all results are back or come back prior to scheduled appointment she requests that he be able to come in ASP once received. ?

## 2021-07-28 NOTE — Telephone Encounter (Signed)
Spoke to [employer] and informed her of MD recommendation and she verbalized understanding.  ?

## 2021-07-29 ENCOUNTER — Ambulatory Visit: Payer: Medicare PPO | Admitting: Speech Pathology

## 2021-07-29 ENCOUNTER — Ambulatory Visit: Payer: Medicare PPO

## 2021-07-30 ENCOUNTER — Encounter (HOSPITAL_COMMUNITY): Payer: Self-pay | Admitting: Oncology

## 2021-07-31 ENCOUNTER — Other Ambulatory Visit: Payer: Self-pay

## 2021-07-31 ENCOUNTER — Emergency Department
Admission: EM | Admit: 2021-07-31 | Discharge: 2021-07-31 | Disposition: A | Discharge status: Home / Self Care | Payer: Medicare PPO | Attending: Emergency Medicine | Admitting: Emergency Medicine

## 2021-07-31 DIAGNOSIS — R569 Unspecified convulsions: Secondary | ICD-10-CM

## 2021-07-31 DIAGNOSIS — G20 Parkinson's disease: Secondary | ICD-10-CM | POA: Diagnosis not present

## 2021-07-31 DIAGNOSIS — I2510 Atherosclerotic heart disease of native coronary artery without angina pectoris: Secondary | ICD-10-CM | POA: Insufficient documentation

## 2021-07-31 DIAGNOSIS — N189 Chronic kidney disease, unspecified: Secondary | ICD-10-CM | POA: Insufficient documentation

## 2021-07-31 DIAGNOSIS — G40909 Epilepsy, unspecified, not intractable, without status epilepticus: Secondary | ICD-10-CM | POA: Diagnosis not present

## 2021-07-31 LAB — BASIC METABOLIC PANEL
Anion gap: 6 (ref 5–15)
BUN: 41 mg/dL — ABNORMAL HIGH (ref 8–23)
CO2: 25 mmol/L (ref 22–32)
Calcium: 8.8 mg/dL — ABNORMAL LOW (ref 8.9–10.3)
Chloride: 106 mmol/L (ref 98–111)
Creatinine, Ser: 2.07 mg/dL — ABNORMAL HIGH (ref 0.61–1.24)
GFR, Estimated: 35 mL/min — ABNORMAL LOW (ref >60)
Glucose, Bld: 130 mg/dL — ABNORMAL HIGH (ref 70–99)
Potassium: 4.6 mmol/L (ref 3.5–5.1)
Sodium: 137 mmol/L (ref 135–145)

## 2021-07-31 LAB — CBC
HCT: 45 % (ref 39.0–52.0)
Hemoglobin: 14 g/dL (ref 13.0–17.0)
MCH: 28.1 pg (ref 26.0–34.0)
MCHC: 31.1 g/dL (ref 30.0–36.0)
MCV: 90.2 fL (ref 80.0–100.0)
Platelets: 263 10*3/uL (ref 150–400)
RBC: 4.99 MIL/uL (ref 4.22–5.81)
RDW: 14 % (ref 11.5–15.5)
WBC: 17.4 10*3/uL — ABNORMAL HIGH (ref 4.0–10.5)
nRBC: 0 % (ref 0.0–0.2)

## 2021-07-31 MED ORDER — LEVETIRACETAM 250 MG PO TABS: 250.0000 mg | ORAL_TABLET | Freq: Two times a day (BID) | ORAL | 1 refills | Status: AC

## 2021-07-31 MED ORDER — SODIUM CHLORIDE 0.9 % IV SOLN
1000.0000 mL | Freq: Once | INTRAVENOUS | Status: AC
  Administered 2021-07-31: 1000 mL via INTRAVENOUS

## 2021-07-31 NOTE — ED Notes (Signed)
Dc ppw provided to patient and family. Pt denies any questions at this time. Pt follow up and RX information given. pt assisted with dressing and provides verbal consent for dc. Pt to lobby with RN via wheelchair ?

## 2021-07-31 NOTE — ED Triage Notes (Signed)
Pt comes into the ED via EMS from home with c/o 2 witnessed seizures this morning, one by wife and the other by EMS, tonic clonic lasting approximately 20 sec. Pt is a/ox4 on arrival, recently dx with  parkinsons.   ? ?146/82 ?96%RA ?HR76 ?[id number] ?FQ72 ?

## 2021-07-31 NOTE — ED Triage Notes (Signed)
See first nurse note- Patient to ER via [facility]. Patient had two witnessed seizures today. Patient reports hx of seizures, has been complaint with medications. Patient reports taking keppra '500mg'$  twice daily.  ? ?Alert and oriented in triage.  ?

## 2021-07-31 NOTE — ED Provider Notes (Signed)
? ?[facility] ?Provider Note ? ? ? Event Date/Time  ? First MD Initiated Contact with Patient 07/31/21 1147   ?  (approximate) ? ? ?History  ? ?Seizures ? ? ?HPI ? ?Cory [name] is a 66 y.o. male with a history of CKD, CAD, Parkinson's disease, seizures who presents after a seizure today.  Witnessed by his wife.  He reports compliance with his 500 mg twice daily of Keppra.  Was increased couple of months ago reportedly.  He does see Dr. Manuella Ghazi of neurology.  Feels well currently has no complaints.  No new neurodeficits. ?  ? ? ?Physical Exam  ? ?Triage Vital Signs: ?ED Triage Vitals [07/31/21 1053]  ?Enc Vitals Group  ?   BP 99/63  ?   Pulse Rate 87  ?   Resp 18  ?   Temp   ?   Temp src   ?   SpO2 100 %  ?   Weight   ?   Height 1.727 m ('5\' 8"'$ )  ?   Head Circumference   ?   Peak Flow   ?   Pain Score 0  ?   Pain Loc   ?   Pain Edu?   ?   Excl. in [place]?   ? ? ?Most recent vital signs: ?Vitals:  ? 07/31/21 1330 07/31/21 1400  ?BP: (!) 159/145 108/83  ?Pulse: 68 74  ?Resp: 16 16  ?SpO2: 99% 100%  ? ? ? ?General: Awake, no distress.  ?CV:  Good peripheral perfusion.  ?Resp:  Normal effort.  ?Abd:  No distention.  ?Other:  Cranial nerves II to XII are normal, normal neuro exam ? ? ?ED Results / Procedures / Treatments  ? ?Labs ?(all labs ordered are listed, but only abnormal results are displayed) ?Labs Reviewed  ?BASIC METABOLIC PANEL - Abnormal; Notable for the following components:  ?    Result Value  ? Glucose, Bld 130 (*)   ? BUN 41 (*)   ? Creatinine, Ser 2.07 (*)   ? Calcium 8.8 (*)   ? GFR, Estimated 35 (*)   ? All other components within normal limits  ?CBC - Abnormal; Notable for the following components:  ? WBC 17.4 (*)   ? All other components within normal limits  ?CBG MONITORING, ED  ? ? ? ?EKG ? ?ED ECG REPORT ?I, Lavonia Drafts, the attending physician, personally viewed and interpreted this ECG. ? ?Date: 07/31/2021 ? ?Rhythm: normal sinus rhythm ?QRS Axis: normal ?Intervals:  normal ?ST/T Wave abnormalities: normal ?Narrative Interpretation: no evidence of acute ischemia ? ? ? ?RADIOLOGY ? ? ? ? ?PROCEDURES: ? ?Critical Care performed:  ? ?Procedures ? ? ?MEDICATIONS ORDERED IN ED: ?Medications  ?0.9 %  sodium chloride infusion (0 mLs Intravenous Stopped 07/31/21 1417)  ? ? ? ?IMPRESSION / MDM / ASSESSMENT AND PLAN / ED COURSE  ?I reviewed the triage vital signs and the nursing notes. ? ? ?Patient presents after a seizure, history of seizures.  Overall well-appearing here, no intraoral injury.  No head injury. ? ?Lab work reviewed, elevated BUN/creatinine ratio consistent with dehydration, IV fluids given ? ?Has chronically elevated white blood cell count which is one of the reasons that he was weaned off lamotrigine and switch to Keppra ? ?Been doing well with Keppra. ? ?Observed in the emergency department, no further seizure activity, will increase Keppra dosing to 750 twice daily, close follow-up with Dr. Manuella Ghazi recommended. ? ? ? ? ?  ? ? ?  FINAL CLINICAL IMPRESSION(S) / ED DIAGNOSES  ? ?Final diagnoses:  ?Seizure ([place])  ? ? ? ?Rx / DC Orders  ? ?ED Discharge Orders   ? ?      Ordered  ?  levETIRAcetam (KEPPRA) 250 MG tablet  2 times daily       ? 07/31/21 1358  ? ?  ?  ? ?  ? ? ? ?Note:  This document was prepared using Dragon voice recognition software and may include unintentional dictation errors. ?  ?Lavonia Drafts, MD ?07/31/21 1534 ? ?

## 2021-07-31 NOTE — Discharge Instructions (Addendum)
Please take 750 mg of Keppra in the morning and 750 mg of Keppra in the evening as we discussed ?

## 2021-08-03 ENCOUNTER — Ambulatory Visit: Payer: Medicare PPO

## 2021-08-03 ENCOUNTER — Ambulatory Visit: Payer: Medicare PPO | Admitting: Speech Pathology

## 2021-08-04 ENCOUNTER — Ambulatory Visit: Payer: Medicare PPO | Admitting: Oncology

## 2021-08-05 ENCOUNTER — Ambulatory Visit: Payer: Medicare PPO | Admitting: Speech Pathology

## 2021-08-05 ENCOUNTER — Inpatient Hospital Stay: Payer: Medicare PPO

## 2021-08-05 ENCOUNTER — Inpatient Hospital Stay: Payer: Medicare PPO | Attending: Oncology | Admitting: Oncology

## 2021-08-05 ENCOUNTER — Ambulatory Visit: Payer: Medicare PPO

## 2021-08-05 VITALS — BP 104/70 | HR 84 | Temp 96.6°F | Resp 18 | Wt 153.8 lb

## 2021-08-05 DIAGNOSIS — I719 Aortic aneurysm of unspecified site, without rupture: Secondary | ICD-10-CM

## 2021-08-05 DIAGNOSIS — R569 Unspecified convulsions: Secondary | ICD-10-CM | POA: Insufficient documentation

## 2021-08-05 DIAGNOSIS — D721 Eosinophilia, unspecified: Secondary | ICD-10-CM | POA: Diagnosis present

## 2021-08-05 DIAGNOSIS — D7219 Other eosinophilia: Secondary | ICD-10-CM

## 2021-08-05 DIAGNOSIS — D72119 Hypereosinophilic syndrome (hes), unspecified: Secondary | ICD-10-CM | POA: Diagnosis not present

## 2021-08-05 DIAGNOSIS — J45909 Unspecified asthma, uncomplicated: Secondary | ICD-10-CM | POA: Diagnosis not present

## 2021-08-05 DIAGNOSIS — Z803 Family history of malignant neoplasm of breast: Secondary | ICD-10-CM | POA: Diagnosis not present

## 2021-08-05 DIAGNOSIS — I7300 Raynaud's syndrome without gangrene: Secondary | ICD-10-CM | POA: Diagnosis not present

## 2021-08-05 DIAGNOSIS — Z720 Tobacco use: Secondary | ICD-10-CM | POA: Insufficient documentation

## 2021-08-05 DIAGNOSIS — Z801 Family history of malignant neoplasm of trachea, bronchus and lung: Secondary | ICD-10-CM | POA: Diagnosis not present

## 2021-08-05 DIAGNOSIS — G20 Parkinson's disease: Secondary | ICD-10-CM | POA: Insufficient documentation

## 2021-08-05 NOTE — Progress Notes (Signed)
Patient here for follow up. Reports fingers getting cold and turning blue  ?

## 2021-08-05 NOTE — Progress Notes (Signed)
?Hematology/Oncology Progress note ?[phone] [phone] ?  ? ?   ? ? ?Patient Care Team: ?Adin Hector, MD as PCP - General (Internal Medicine) ?Earlie Server, MD as Consulting Physician (Hematology and Oncology) ?Algernon Huxley, MD as Consulting Physician (Vascular Surgery) ?Vladimir Crofts, MD as Consulting Physician (Neurology) ?Darl Pikes as Referring Physician (Physician Assistant) ? ?REFERRING PROVIDER: ?Adin Hector, MD  ?CHIEF COMPLAINTS/REASON FOR VISIT:  ?Follow up of eosinophilia ? ?HISTORY OF PRESENTING ILLNESS:  ? ?Cory [name] is a  66 y.o.  male with PMH listed below was seen in consultation at the request of  Adin Hector, MD  for evaluation of eosinophilia ? ?Patient has a history of chronic eosinophilia.  Previous lab results were reviewed. ?Eosinophilia dated back to January 2021, progressively getting worse. ?He also has chronic basophilia. ?Patient has a history of asthma for which he follows up with pulmonology.  He reports that symptoms are well controlled.  He rarely has to use inhalers.  Denies wheezing, shortness of breath. ?Patient has Parkinson's disease, on carbidopa/levodopa, amantadine, Comptan  ?patient has symptoms of dyskinesia, balancing, cognitive impairment.  He is on /memantine for cognitive impairment. ?He also has episodes of orthostatic hypotension.  Has been on midodrine 2.5 mg twice daily. ?He also has history of nonconvulsive seizure and has been on lamotrigine 100 mg daily since March 2022. ?He reports appetite has been good.  Weight has been stable.  No fever or chills, night sweats, skin rash.  ?Patient smokes 1 to 2 cigarettes every 2 weeks ? ?04/15/2021, CT angiogram chest abdomen pelvis ?1. 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 No other aortic aneurysm is seen.There is mild aortic atherosclerosis in the arch segment with mixed plaque, and moderate abdominal aortic mixed plaque. ?2. 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 There is no dissection, and no further penetrating ulcers. ?3. Equivocal stranding adjacent the proximal third of the sigmoid colon which could be early acute diverticulitis or fat scarring from a previous episode ?4. Imaging findings of gastroenteritis ?5. Ill-defined area of enhancement in the posterior liver dome measuring 2.1 x 1.9 x 1.5 cm ? 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 Imaging findings of gastroenteritis. ?Ill-defined area of enhancement in the posterior liver dome, 2.1 x 1.9 x 1.5 cm; Increased haziness in the mesenteric root fat without adenopathy ? ? ?05/24/2020 Echo LVEF 55-60%.  ? ?# 2/27 23 MRI abdomen right hepatic lobe vascular shunting or benign focal nodular hyperplasia. No worrisome lesion. Spleen was unremarkable.  ? ?It was previously felt that lamotrigine may be related to patient's eosinophilia.  Given that he has not had any seizure like activities, with the permission from his neurologist, patient started to taper down lamotrigine.  He decreased to lamotrigine 50 mg daily for about a week, and then taper down to 25 mg daily.  He was completely tapered off lamotrigine on 06/15/2021. ?07/02/2021, WBC 19.4, absolute eosinophil 8. ? ?07/06/2021, patient has experienced seizure activity.  Vision impairment, word difficulties, no responsive but awake.   ?07/07/2021, ED evaluation with recurrent seizure- activity,.  Patient was started on Keppra.  His MRI brain was unrevealing. ?07/08/2021 patient was seen by neurology Dr. Manuella Ghazi, increased Keppra to 500 mg by  mouth twice daily. ? ?07/10/2021 seen by me again and decision made to proceed with bone marrow biopsy.  ? ?INTERVAL HISTORY ?Cory [name] is a 66 y.o. male who has above history reviewed by me today presents for follow up visit for eosinophilia. ? ?+ additional seizure episodes.  ?+ low blood pressure with position changes.  ?+ Finger turning blue ?+ decline of functional status over the past few weeks.  ? 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   ?Cytogenetics showed loss of Y chromosome. ? ?# [employer] FISH analysis showed negative PDGFRa, , PDGFRb, FGFR1, CBFB.  ? ? ? ? ?Review of Systems  ?Constitutional:  Negative for appetite change, chills, fatigue, fever and unexpected weight change.  ?HENT:   Negative for hearing loss and voice change.   ?Eyes:  Negative for eye problems and icterus.  ?Respiratory:  Negative for chest tightness, cough and shortness of breath.   ?Cardiovascular:  Negative for chest pain and leg swelling.  ?Gastrointestinal:  Negative for abdominal distention and abdominal pain.  ?Endocrine: Negative for hot flashes.  ?Genitourinary:  Negative for difficulty urinating, dysuria and frequency.   ?Musculoskeletal:  Positive for gait problem. Negative for arthralgias.  ?Skin:  Negative for itching and rash.  ?Neurological:  Positive for gait problem, light-headedness and seizures. Negative for numbness.  ?Hematological:  Negative for adenopathy. Does not bruise/bleed easily.  ?Psychiatric/Behavioral:  Negative for confusion.   ? ?MEDICAL HISTORY:  ?Past Medical History:  ?Diagnosis Date  ? Abnormal ankle brachial index   ? Aortic atherosclerosis ([place])   ? Arthritis   ? hips and back  ? Asthma   ? as a baby-   ? Bronchiectasis without complication ([place])   ? CKD (chronic kidney disease) stage 3, GFR 30-59 ml/min (HCC)   ? Coronary artery calcification seen on CT scan    ? 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 ED (erectile dysfunction)   ? Eosinophilia   ? GERD (gastroesophageal reflux disease)   ? rare -   ? History of BPH   ? History of genital warts   ? History of solitary pulmonary nodule   ? Left and right lung nodules on CT in ER 1/20; stable by CT 2/21  ? Hypertriglyceridemia   ? Microscopic hematuria   ? Other chronic pain   ? Parkinson's disease ([place])   ? Pneumonia   ? age 9  ? Post-traumatic osteoarthritis of left knee   ? Rash   ? legs- on going  ? Seizure-like activity ([place])   ? Umbilical hernia   ? ? ?SURGICAL HISTORY: ?Past Surgical History:  ?Procedure Laterality Date  ? APPENDECTOMY    ? BACK SURGERY    ? posterior lumbar spine fusion L4-5  ? COLONOSCOPY    ? COLONOSCOPY WITH PROPOFOL N/A 06/06/2017  ? Procedure: COLONOSCOPY WITH PROPOFOL;  Surgeon: Manya Silvas, MD;  Location[facility] END[facility]COPY;  Service: Endoscopy;  Laterality: N/A;  ? KNEE ARTHROSCOPY Bilateral   ? ACL  ? KNEE ARTHROSCOPY W/ ACL RECONSTRUCTION    ? RADIAL KERATOTOMY    ? ROTATOR CUFF REPAIR Bilateral   ? SHOULDER ARTHROSCOPY WITH BICEPS TENDON REPAIR Left 04/16/2019  ? Procedure: SHOULDER ARTHROSCOPY WITH BICEPS TENDON REPAIR, MINI OPEN SUPERIOR  CAPSULAR RECONSTRUCTION, BICEPS TENODESIS;  Surgeon: Leim Fabry, MD;  Location: [facility] ORS;  Service: Orthopedics;  Laterality: Left;  ? ? ?SOCIAL HISTORY: ?Social History  ? ?Socioeconomic History  ? Marital status: Married  ?  Spouse name: Not on file  ? Number of children: Not on file  ? Years of education: Not on file  ? Highest education level: Not on file  ?Occupational History  ? Not on file  ?Tobacco Use  ? Smoking status: Some Days  ?  Packs/day: 0.25  ?  Years: 23.00  ?  Pack years: 5.75  ?  Types: Cigarettes, Cigars  ? Smokeless tobacco: Never  ? Tobacco comments:  ?  smokes 1 cigar per day 04/30/2016  ?Vaping Use  ? Vaping Use: Never used  ?Substance and Sexual Activity  ? Alcohol use: Not Currently  ?  Alcohol/week: 1.0 standard drink   ?  Types: 1 Shots of liquor per week  ?  Comment: has not had in 2 years  ? Drug use: No  ? Sexual activity: Not on file  ?Other Topics Concern  ? Not on file  ?Social History Narrative  ? Not on file  ? ?So

## 2021-08-06 LAB — STRONGYLOIDES, AB, IGG: Strongyloides, Ab, IgG: NEGATIVE

## 2021-08-08 DIAGNOSIS — I7300 Raynaud's syndrome without gangrene: Secondary | ICD-10-CM | POA: Insufficient documentation

## 2021-08-08 DIAGNOSIS — R569 Unspecified convulsions: Secondary | ICD-10-CM | POA: Insufficient documentation

## 2021-08-10 ENCOUNTER — Ambulatory Visit: Payer: Medicare PPO | Admitting: Speech Pathology

## 2021-08-10 ENCOUNTER — Ambulatory Visit: Payer: Medicare PPO

## 2021-08-12 ENCOUNTER — Ambulatory Visit: Payer: Medicare PPO

## 2021-08-12 ENCOUNTER — Encounter: Payer: Medicare PPO | Admitting: Speech Pathology

## 2021-08-12 ENCOUNTER — Inpatient Hospital Stay: Payer: Medicare PPO

## 2021-08-12 ENCOUNTER — Telehealth: Payer: Self-pay

## 2021-08-12 ENCOUNTER — Other Ambulatory Visit: Payer: Self-pay | Admitting: Oncology

## 2021-08-12 DIAGNOSIS — D721 Eosinophilia, unspecified: Secondary | ICD-10-CM | POA: Diagnosis not present

## 2021-08-12 DIAGNOSIS — D72119 Hypereosinophilic syndrome (hes), unspecified: Secondary | ICD-10-CM

## 2021-08-12 DIAGNOSIS — R768 Other specified abnormal immunological findings in serum: Secondary | ICD-10-CM

## 2021-08-12 LAB — CBC WITH DIFFERENTIAL/PLATELET
Abs Immature Granulocytes: 0.04 10*3/uL (ref 0.00–0.07)
Basophils Absolute: 0.2 10*3/uL — ABNORMAL HIGH (ref 0.0–0.1)
Basophils Relative: 1 %
Eosinophils Absolute: 10.4 10*3/uL — ABNORMAL HIGH (ref 0.0–0.5)
Eosinophils Relative: 52 %
HCT: 42.3 % (ref 39.0–52.0)
Hemoglobin: 13.8 g/dL (ref 13.0–17.0)
Immature Granulocytes: 0 %
Lymphocytes Relative: 8 %
Lymphs Abs: 1.5 10*3/uL (ref 0.7–4.0)
MCH: 29 pg (ref 26.0–34.0)
MCHC: 32.6 g/dL (ref 30.0–36.0)
MCV: 88.9 fL (ref 80.0–100.0)
Monocytes Absolute: 1.1 10*3/uL — ABNORMAL HIGH (ref 0.1–1.0)
Monocytes Relative: 5 %
Neutro Abs: 6.8 10*3/uL (ref 1.7–7.7)
Neutrophils Relative %: 34 %
Platelets: 237 10*3/uL (ref 150–400)
RBC: 4.76 MIL/uL (ref 4.22–5.81)
RDW: 14.2 % (ref 11.5–15.5)
WBC: 20 10*3/uL — ABNORMAL HIGH (ref 4.0–10.5)
nRBC: 0 % (ref 0.0–0.2)

## 2021-08-12 MED ORDER — OMEPRAZOLE 20 MG PO CPDR: 20.0000 mg | DELAYED_RELEASE_CAPSULE | Freq: Every day | ORAL | 2 refills | Status: DC

## 2021-08-12 MED ORDER — PREDNISONE 20 MG PO TABS: 60.0000 mg | ORAL_TABLET | Freq: Every day | ORAL | 0 refills | Status: DC

## 2021-08-12 NOTE — Progress Notes (Signed)
bc

## 2021-08-12 NOTE — Telephone Encounter (Signed)
Left mssg wtih Amy @ Dr. Jessy Oto office (cardiology) to let Dr. Corky Sox know that it is ok to proceed with ordering Cardiac MRI  ?

## 2021-08-13 ENCOUNTER — Other Ambulatory Visit: Payer: Self-pay | Admitting: Cardiology

## 2021-08-13 ENCOUNTER — Ambulatory Visit (HOSPITAL_COMMUNITY)
Admission: RE | Admit: 2021-08-13 | Discharge: 2021-08-13 | Disposition: A | Discharge status: Home / Self Care | Payer: Medicare PPO | Source: Ambulatory Visit | Attending: Oncology | Admitting: Oncology

## 2021-08-13 DIAGNOSIS — D72119 Hypereosinophilic syndrome (hes), unspecified: Secondary | ICD-10-CM | POA: Diagnosis present

## 2021-08-13 LAB — ANCA TITERS
Atypical P-ANCA titer: <1:20 {titer}
C-ANCA: <1:20 {titer}
P-ANCA: <1:20 {titer}

## 2021-08-13 MED ORDER — GADOBUTROL 1 MMOL/ML IV SOLN
7.0000 mL | Freq: Once | INTRAVENOUS | Status: AC | PRN
  Administered 2021-08-13: 7 mL via INTRAVENOUS

## 2021-08-14 ENCOUNTER — Ambulatory Visit (INDEPENDENT_AMBULATORY_CARE_PROVIDER_SITE_OTHER): Payer: Medicare PPO

## 2021-08-14 ENCOUNTER — Encounter (INDEPENDENT_AMBULATORY_CARE_PROVIDER_SITE_OTHER): Payer: Self-pay | Admitting: Vascular Surgery

## 2021-08-14 ENCOUNTER — Ambulatory Visit (INDEPENDENT_AMBULATORY_CARE_PROVIDER_SITE_OTHER): Payer: Medicare PPO | Admitting: Vascular Surgery

## 2021-08-14 VITALS — BP 139/77 | HR 66 | Resp 16 | Ht 67.0 in | Wt 151.0 lb

## 2021-08-14 DIAGNOSIS — I719 Aortic aneurysm of unspecified site, without rupture: Secondary | ICD-10-CM

## 2021-08-14 DIAGNOSIS — K219 Gastro-esophageal reflux disease without esophagitis: Secondary | ICD-10-CM | POA: Insufficient documentation

## 2021-08-14 DIAGNOSIS — I7300 Raynaud's syndrome without gangrene: Secondary | ICD-10-CM

## 2021-08-14 DIAGNOSIS — G20A1 Parkinson's disease without dyskinesia, without mention of fluctuations: Secondary | ICD-10-CM

## 2021-08-14 DIAGNOSIS — R3129 Other microscopic hematuria: Secondary | ICD-10-CM | POA: Insufficient documentation

## 2021-08-14 DIAGNOSIS — I7121 Aneurysm of the ascending aorta, without rupture: Secondary | ICD-10-CM

## 2021-08-14 DIAGNOSIS — G20 Parkinson's disease: Secondary | ICD-10-CM

## 2021-08-14 NOTE — Progress Notes (Signed)
? ? ?MRN : [id number] ? ?Cory [name] is a 66 y.o. (Sep 08, 1955) male who presents with chief complaint of  ?Chief Complaint  ?Patient presents with  ? Follow-up  ?  ultrasound  ?. ? ?History of Present Illness: Patient returns today in follow up of multiple issues.  He and his wife's biggest complaint today is of painful blue fingertips.  He has had what sounds like a fairly extensive work-up from a cardiologist and hematologist but appears to have Raynaud's disease as this clearly gets worse with cold stimulation.  He has strong palpable radial pulses.  He says this gets much worse when it is cold.  He does not have ulceration or skin breakdown.  He does have pain. ?He is also followed for his penetrating aortic ulcer that was seen on the CT scan several months ago.  Duplex today shows mild atherosclerosis in the aorta with normal caliber of the aorta and no aneurysmal degeneration. ?He also has a thoracic aortic aneurysm that will be checked next year.  No symptoms from this. ? ?Current Outpatient Medications  ?Medication Sig Dispense Refill  ? Amantadine HCl 100 MG tablet Take 100 mg by mouth 3 (three) times daily.    ? aspirin EC 81 MG tablet Take 81 mg by mouth daily. Swallow whole.    ? azelastine (ASTELIN) 0.1 % nasal spray Place 1-2 sprays into both nostrils as directed.    ? carbidopa-levodopa (SINEMET CR) 50-200 MG tablet Take 1 tablet by mouth at bedtime.    ? carbidopa-levodopa (SINEMET IR) 25-100 MG tablet Take 2 tablets by mouth 3 (three) times daily.     ? entacapone (COMTAN) 200 MG tablet Take 200 mg by mouth 3 (three) times daily.    ? fluticasone-salmeterol (ADVAIR) 250-50 MCG/ACT AEPB Inhale 1 puff into the lungs as needed.    ? Fluticasone-Umeclidin-Vilant (TRELEGY ELLIPTA) 100-62.5-25 MCG/INH AEPB Inhale 1 puff into the lungs daily as needed.    ? levETIRAcetam (KEPPRA) 250 MG tablet Take 1 tablet (250 mg total) by mouth 2 (two) times daily. 60 tablet 1  ? levETIRAcetam (KEPPRA) 500 MG tablet  Take 500 mg by mouth 2 (two) times daily. Take along with '250mg'$  for total of '750mg'$     ? memantine (NAMENDA) 5 MG tablet Take 5 mg by mouth daily.    ? midodrine (PROAMATINE) 2.5 MG tablet Take 2.5-5 mg by mouth See admin instructions. Take '5mg'$  at breakfast (as needed), 2.'5mg'$  at lunch (as needed) and take 2.'5mg'$  at supper (as needed) for low blood pressure    ? MYRBETRIQ 50 MG TB24 tablet Take 50 mg by mouth at bedtime.    ? NAYZILAM 5 MG/0.1ML SOLN Place into both nostrils.    ? omeprazole (PRILOSEC) 20 MG capsule Take 1 capsule (20 mg total) by mouth daily. 30 capsule 2  ? pravastatin (PRAVACHOL) 10 MG tablet Take 10 mg by mouth daily.    ? predniSONE (DELTASONE) 20 MG tablet Take 3 tablets (60 mg total) by mouth daily. 60 tablet 0  ? sertraline (ZOLOFT) 50 MG tablet Take 50 mg by mouth daily.    ? tamsulosin (FLOMAX) 0.4 MG CAPS capsule Take 1 capsule (0.4 mg total) by mouth daily after supper. 10 capsule 0  ? tiZANidine (ZANAFLEX) 4 MG tablet Take 4 mg by mouth daily as needed for muscle spasms.     ? amoxicillin (AMOXIL) 875 MG tablet Take 875 mg by mouth 2 (two) times daily. (Patient not taking: Reported on 08/05/2021)    ?  cyanocobalamin 1000 MCG tablet Take 1 tablet (1,000 mcg total) by mouth daily. (Patient not taking: Reported on 08/14/2021) 30 tablet 0  ? ?No current facility-administered medications for this visit.  ? ? ?Past Medical History:  ?Diagnosis Date  ? Abnormal ankle brachial index   ? Aortic atherosclerosis ([place])   ? Arthritis   ? hips and back  ? Asthma   ? as a baby-   ? Bronchiectasis without complication ([place])   ? CKD (chronic kidney disease) stage 3, GFR 30-59 ml/min (HCC)   ? Coronary artery calcification seen on CT scan   ? 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 ED (erectile dysfunction)   ? Eosinophilia   ? GERD (gastroesophageal reflux disease)   ? rare -   ? History of BPH   ? History of genital warts   ? History of solitary pulmonary nodule   ? Left and right lung nodules on CT  in ER 1/20; stable by CT 2/21  ? Hypertriglyceridemia   ? Microscopic hematuria   ? Other chronic pain   ? Parkinson's disease ([place])   ? Pneumonia   ? age 28  ? Post-traumatic osteoarthritis of left knee   ? Rash   ? legs- on going  ? Seizure-like activity ([place])   ? Umbilical hernia   ? ? ?Past Surgical History:  ?Procedure Laterality Date  ? APPENDECTOMY    ? BACK SURGERY    ? posterior lumbar spine fusion L4-5  ? COLONOSCOPY    ? COLONOSCOPY WITH PROPOFOL N/A 06/06/2017  ? Procedure: COLONOSCOPY WITH PROPOFOL;  Surgeon: Manya Silvas, MD;  Location[facility] ENDOSCOPY;  Service: Endoscopy;  Laterality: N/A;  ? KNEE ARTHROSCOPY Bilateral   ? ACL  ? KNEE ARTHROSCOPY W/ ACL RECONSTRUCTION    ? RADIAL KERATOTOMY    ? ROTATOR CUFF REPAIR Bilateral   ? SHOULDER ARTHROSCOPY WITH BICEPS TENDON REPAIR Left 04/16/2019  ? Procedure: SHOULDER ARTHROSCOPY WITH BICEPS TENDON REPAIR, MINI OPEN SUPERIOR CAPSULAR RECONSTRUCTION, BICEPS TENODESIS;  Surgeon: Leim Fabry, MD;  Location: [facility] ORS;  Service: Orthopedics;  Laterality: Left;  ? ? ? ?Social History  ? ?Tobacco Use  ? Smoking status: Some Days  ?  Packs/day: 0.25  ?  Years: 23.00  ?  Pack years: 5.75  ?  Types: Cigarettes, Cigars  ? Smokeless tobacco: Never  ? Tobacco comments:  ?  smokes 1 cigar per day 04/30/2016  ?Vaping Use  ? Vaping Use: Never used  ?Substance Use Topics  ? Alcohol use: Not Currently  ?  Alcohol/week: 1.0 standard drink  ?  Types: 1 Shots of liquor per week  ?  Comment: has not had in 2 years  ? Drug use: No  ? ? ? ? ?Family History  ?Problem Relation Age of Onset  ? Hypertension Mother   ? Breast cancer Mother   ? Lung cancer Father   ? Breast cancer Sister   ? ? ? ?No Known Allergies ? ?REVIEW OF SYSTEMS (Negative unless checked) ?  ?Constitutional: '[]'$ Weight loss  '[]'$ Fever  '[]'$ Chills ?Cardiac: '[]'$ Chest pain   '[]'$ Chest pressure   '[]'$ Palpitations   '[]'$ Shortness of breath when laying flat   '[]'$ Shortness of breath at rest   '[]'$ Shortness of breath with  exertion. ?Vascular:  '[]'$ Pain in legs with walking   '[]'$ Pain in legs at rest   '[]'$ Pain in legs when laying flat   '[]'$ Claudication   '[]'$ Pain in feet when walking  '[]'$ Pain in feet at rest  '[]'$   Pain in feet when laying flat   '[]'$ History of DVT   '[]'$ Phlebitis   '[]'$ Swelling in legs   '[x]'$ Varicose veins   '[]'$ Non-healing ulcers ?Pulmonary:   '[]'$ Uses home oxygen   '[]'$ Productive cough   '[]'$ Hemoptysis   '[]'$ Wheeze  '[]'$ COPD   '[]'$ Asthma ?Neurologic:  '[]'$ Dizziness  '[]'$ Blackouts   '[x]'$ Seizures   '[]'$ History of stroke   '[]'$ History of TIA  '[]'$ Aphasia   '[]'$ Temporary blindness   '[]'$ Dysphagia   '[]'$ Weakness or numbness in arms   '[]'$ Weakness or numbness in legs ?Musculoskeletal:  '[x]'$ Arthritis   '[]'$ Joint swelling   '[x]'$ Joint pain   '[]'$ Low back pain ?Hematologic:  '[]'$ Easy bruising  '[]'$ Easy bleeding   '[]'$ Hypercoagulable state   '[]'$ Anemic  '[]'$ Hepatitis ?Gastrointestinal:  '[]'$ Blood in stool   '[]'$ Vomiting blood  '[x]'$ Gastroesophageal reflux/heartburn   '[]'$ Abdominal pain ?Genitourinary:  '[]'$ Chronic kidney disease   '[]'$ Difficult urination  '[]'$ Frequent urination  '[]'$ Burning with urination   '[]'$ Hematuria ?Skin:  '[]'$ Rashes   '[]'$ Ulcers   '[]'$ Wounds ?Psychological:  '[]'$ History of anxiety   '[]'$  History of major depression. ? ?Physical Examination ? ?BP 139/77 (BP Location: Right Arm)   Pulse 66   Resp 16   Ht '5\' 7"'$  (1.702 m)   Wt 151 lb (68.5 kg)   BMI 23.65 kg/m?  ?Gen:  WD/WN, NAD.  Appears debilitated and older than stated age ?Head: [place]/AT, No temporalis wasting. ?Ear/Nose/Throat: Hearing grossly intact, nares w/o erythema or drainage ?Eyes: Conjunctiva clear. Sclera non-icteric ?Neck: Supple.  Trachea midline ?Pulmonary:  Good air movement, no use of accessory muscles.  ?Cardiac: RRR, no JVD ?Vascular:  ?Vessel Right Left  ?Radial Palpable Palpable  ?    ? ?Musculoskeletal: M/S 5/5 throughout.  No deformity or atrophy.  Walks with a walker.  Multiple fingers on both hands are purplish and whitish from the mid finger on.  This does appear to have a Raynaud's phenomenon appearance.  Mild lower  extremity edema. ?Neurologic: Sensation grossly intact in extremities.  Symmetrical.  Speech is fluent.  ?Psychiatric: Judgment intact, Mood & affect appropriate for pt's clinical situation. ?Dermatologic: No rashes or u

## 2021-08-17 ENCOUNTER — Encounter: Payer: Medicare PPO | Admitting: Speech Pathology

## 2021-08-17 ENCOUNTER — Ambulatory Visit: Payer: Medicare PPO

## 2021-08-17 ENCOUNTER — Encounter (HOSPITAL_COMMUNITY): Payer: Self-pay | Admitting: Oncology

## 2021-08-19 ENCOUNTER — Ambulatory Visit: Payer: Medicare PPO

## 2021-08-19 ENCOUNTER — Encounter: Payer: Medicare PPO | Admitting: Speech Pathology

## 2021-08-24 ENCOUNTER — Encounter: Payer: Medicare PPO | Admitting: Speech Pathology

## 2021-08-24 ENCOUNTER — Encounter: Payer: Self-pay | Admitting: Oncology

## 2021-08-24 ENCOUNTER — Ambulatory Visit: Payer: Medicare PPO

## 2021-08-24 NOTE — Telephone Encounter (Signed)
Please advise.  Just FYI- i've been following up with heme and referral is still in process.

## 2021-08-25 ENCOUNTER — Other Ambulatory Visit: Payer: Self-pay

## 2021-08-25 ENCOUNTER — Telehealth: Payer: Self-pay

## 2021-08-25 DIAGNOSIS — D72119 Hypereosinophilic syndrome (hes), unspecified: Secondary | ICD-10-CM

## 2021-08-25 NOTE — Telephone Encounter (Signed)
Cory [name], please schedule [patient for labs tomorrow, 5/24 between 11-4 per patient request. Please notify patient of appt. Thanks

## 2021-08-25 NOTE — Telephone Encounter (Signed)
Lamar Sprinkles, please schedule patient for labs on Wed between 11-4 per patient request. Please notify patient of appt. Thanks

## 2021-08-26 ENCOUNTER — Encounter: Payer: Medicare PPO | Admitting: Speech Pathology

## 2021-08-26 ENCOUNTER — Ambulatory Visit: Payer: Medicare PPO

## 2021-08-26 ENCOUNTER — Inpatient Hospital Stay: Payer: Medicare PPO

## 2021-08-26 DIAGNOSIS — D721 Eosinophilia, unspecified: Secondary | ICD-10-CM | POA: Diagnosis not present

## 2021-08-26 DIAGNOSIS — D72119 Hypereosinophilic syndrome (hes), unspecified: Secondary | ICD-10-CM

## 2021-08-26 LAB — CBC WITH DIFFERENTIAL/PLATELET
Abs Immature Granulocytes: 0.28 10*3/uL — ABNORMAL HIGH (ref 0.00–0.07)
Basophils Absolute: 0 10*3/uL (ref 0.0–0.1)
Basophils Relative: 0 %
Eosinophils Absolute: 0 10*3/uL (ref 0.0–0.5)
Eosinophils Relative: 0 %
HCT: 47.6 % (ref 39.0–52.0)
Hemoglobin: 15.4 g/dL (ref 13.0–17.0)
Immature Granulocytes: 1 %
Lymphocytes Relative: 8 %
Lymphs Abs: 1.7 10*3/uL (ref 0.7–4.0)
MCH: 29 pg (ref 26.0–34.0)
MCHC: 32.4 g/dL (ref 30.0–36.0)
MCV: 89.6 fL (ref 80.0–100.0)
Monocytes Absolute: 0.8 10*3/uL (ref 0.1–1.0)
Monocytes Relative: 4 %
Neutro Abs: 19.1 10*3/uL — ABNORMAL HIGH (ref 1.7–7.7)
Neutrophils Relative %: 87 %
Platelets: 228 10*3/uL (ref 150–400)
RBC: 5.31 MIL/uL (ref 4.22–5.81)
RDW: 15 % (ref 11.5–15.5)
WBC: 22 10*3/uL — ABNORMAL HIGH (ref 4.0–10.5)
nRBC: 0 % (ref 0.0–0.2)

## 2021-08-28 ENCOUNTER — Other Ambulatory Visit: Payer: Self-pay | Admitting: Oncology

## 2021-08-28 ENCOUNTER — Other Ambulatory Visit: Payer: Self-pay

## 2021-08-28 DIAGNOSIS — D72119 Hypereosinophilic syndrome (hes), unspecified: Secondary | ICD-10-CM

## 2021-08-28 MED ORDER — PREDNISONE 20 MG PO TABS: 40.0000 mg | ORAL_TABLET | Freq: Every day | ORAL | 0 refills | Status: DC

## 2021-08-28 NOTE — Telephone Encounter (Signed)
Cory [name], will you please schedule pt for lab/MD in 2 weeks and inform pt of appt. Thanks

## 2021-08-28 NOTE — Telephone Encounter (Signed)
Please advise 

## 2021-09-02 ENCOUNTER — Ambulatory Visit: Payer: Medicare PPO

## 2021-09-02 ENCOUNTER — Encounter: Payer: Medicare PPO | Admitting: Speech Pathology

## 2021-09-07 ENCOUNTER — Encounter: Payer: Medicare PPO | Admitting: Speech Pathology

## 2021-09-08 ENCOUNTER — Telehealth (HOSPITAL_COMMUNITY): Payer: Self-pay | Admitting: Emergency Medicine

## 2021-09-08 NOTE — Telephone Encounter (Signed)
Reaching out to patient to offer assistance regarding upcoming cardiac imaging study; pt verbalizes understanding of appt date/time, parking situation and where to check in, pre-test NPO status and medications ordered, and verified current allergies; name and call back number provided for further questions should they arise Marchia Bond RN Navigator Cardiac Imaging [facility] [phone] office [phone] cell  Arrival [address] main entrance / registration Denies claustro Denies metal implants Denies iv issues

## 2021-09-09 ENCOUNTER — Ambulatory Visit
Admission: RE | Admit: 2021-09-09 | Discharge: 2021-09-09 | Disposition: A | Discharge status: Home / Self Care | Payer: Medicare PPO | Source: Ambulatory Visit | Attending: Cardiology | Admitting: Cardiology

## 2021-09-09 ENCOUNTER — Encounter: Payer: Medicare PPO | Admitting: Speech Pathology

## 2021-09-09 DIAGNOSIS — D72119 Hypereosinophilic syndrome (hes), unspecified: Secondary | ICD-10-CM | POA: Diagnosis not present

## 2021-09-09 MED ORDER — GADOBUTROL 1 MMOL/ML IV SOLN
10.0000 mL | Freq: Once | INTRAVENOUS | Status: AC | PRN
Start: 2021-09-09 — End: 2021-09-09
  Administered 2021-09-09: 10 mL via INTRAVENOUS

## 2021-09-11 ENCOUNTER — Inpatient Hospital Stay: Payer: Medicare PPO | Admitting: Oncology

## 2021-09-11 ENCOUNTER — Inpatient Hospital Stay: Payer: Medicare PPO | Attending: Oncology

## 2021-09-11 ENCOUNTER — Encounter: Payer: Self-pay | Admitting: Oncology

## 2021-09-11 ENCOUNTER — Other Ambulatory Visit: Payer: Self-pay

## 2021-09-11 VITALS — BP 127/76 | HR 74 | Temp 97.0°F | Resp 18 | Wt 155.2 lb

## 2021-09-11 DIAGNOSIS — Z803 Family history of malignant neoplasm of breast: Secondary | ICD-10-CM | POA: Insufficient documentation

## 2021-09-11 DIAGNOSIS — R569 Unspecified convulsions: Secondary | ICD-10-CM | POA: Insufficient documentation

## 2021-09-11 DIAGNOSIS — G20 Parkinson's disease: Secondary | ICD-10-CM | POA: Diagnosis not present

## 2021-09-11 DIAGNOSIS — Z801 Family history of malignant neoplasm of trachea, bronchus and lung: Secondary | ICD-10-CM | POA: Insufficient documentation

## 2021-09-11 DIAGNOSIS — Z79899 Other long term (current) drug therapy: Secondary | ICD-10-CM | POA: Diagnosis not present

## 2021-09-11 DIAGNOSIS — Z720 Tobacco use: Secondary | ICD-10-CM | POA: Insufficient documentation

## 2021-09-11 DIAGNOSIS — D72119 Hypereosinophilic syndrome (hes), unspecified: Secondary | ICD-10-CM | POA: Diagnosis present

## 2021-09-11 LAB — CBC WITH DIFFERENTIAL/PLATELET
Abs Immature Granulocytes: 0.36 K/uL — ABNORMAL HIGH (ref 0.00–0.07)
Basophils Absolute: 0 K/uL (ref 0.0–0.1)
Basophils Relative: 0 %
Eosinophils Absolute: 0.1 K/uL (ref 0.0–0.5)
Eosinophils Relative: 0 %
HCT: 44.6 % (ref 39.0–52.0)
Hemoglobin: 14.3 g/dL (ref 13.0–17.0)
Immature Granulocytes: 2 %
Lymphocytes Relative: 7 %
Lymphs Abs: 1.3 K/uL (ref 0.7–4.0)
MCH: 28.8 pg (ref 26.0–34.0)
MCHC: 32.1 g/dL (ref 30.0–36.0)
MCV: 89.9 fL (ref 80.0–100.0)
Monocytes Absolute: 0.6 K/uL (ref 0.1–1.0)
Monocytes Relative: 3 %
Neutro Abs: 15.6 K/uL — ABNORMAL HIGH (ref 1.7–7.7)
Neutrophils Relative %: 88 %
Platelets: 226 K/uL (ref 150–400)
RBC: 4.96 MIL/uL (ref 4.22–5.81)
RDW: 15.3 % (ref 11.5–15.5)
WBC: 17.9 K/uL — ABNORMAL HIGH (ref 4.0–10.5)
nRBC: 0 % (ref 0.0–0.2)

## 2021-09-11 MED ORDER — PREDNISONE 10 MG PO TABS: 10.0000 mg | ORAL_TABLET | Freq: Every day | ORAL | 0 refills | Status: DC

## 2021-09-11 NOTE — Progress Notes (Signed)
Pt reports rash to neck, face and chest. They will follow up with dermatology.

## 2021-09-12 NOTE — Assessment & Plan Note (Signed)
Follow-up with neurology.  On Keppra.  MRI was not revealing.

## 2021-09-12 NOTE — Assessment & Plan Note (Signed)
Follow up with Neurology.

## 2021-09-12 NOTE — Assessment & Plan Note (Addendum)
Clinically patient is doing well.  Awaiting second opinion at [place] patient to further taper down prednisone to 30 mg daily.

## 2021-09-12 NOTE — Progress Notes (Signed)
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  Awaiting second opinion at [place] patient to further taper down prednisone to 30 mg daily.   Seizure ([place]) Follow up with Neurology.  Parkinson disease [facility]) Follow-up with neurology.  On Keppra.  MRI was not revealing.   Orders Placed This Encounter  Procedures   CBC with Differential    Standing Status:   Standing    Number of Occurrences:   3    Standing Expiration Date:   09/12/2022     Earlie Server, MD, PhD [facility] Hematology Oncology 09/11/2021    HISTORY OF PRESENTING ILLNESS:   Cory [name] is a  66 y.o.  male with PMH listed below was seen in consultation at the request of  Adin Hector, MD  for evaluation of eosinophilia  Patient has a history of chronic eosinophilia.  Previous lab results were reviewed. Eosinophilia dated back to January 2021, progressively getting worse. He also has chronic basophilia. Patient has a history of asthma for which he follows up with pulmonology.  He reports that symptoms are well controlled.  He rarely has to use inhalers.  Denies wheezing, shortness of breath. Patient has Parkinson's disease, on carbidopa/levodopa, amantadine, Comptan  patient has symptoms of dyskinesia, balancing, cognitive impairment.  He is on /memantine for cognitive impairment. He also has episodes of orthostatic hypotension.   Has been on midodrine 2.5 mg twice daily. 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 He reports appetite has been good.  Weight has been stable.  No fever or chills, night sweats, skin rash.  Patient smokes 1 to 2 cigarettes every 2 weeks  04/15/2021, CT angiogram chest abdomen pelvis 1. 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 No other aortic aneurysm is seen.There is mild aortic atherosclerosis in the arch segment with mixed plaque, and moderate abdominal aortic mixed plaque. 2. 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 There is no dissection, and no further penetrating ulcers. 3. Equivocal stranding adjacent the proximal third of the sigmoid colon which could be early acute diverticulitis or fat scarring from a previous episode 4. Imaging findings of gastroenteritis 5. Ill-defined area of enhancement in the posterior liver dome measuring 2.1 x 1.9 x 1.5 cm  04/15/21 CT chest/abd/pel angiogram showed aneurysm aortic root, penetrating atherosclerotic ulcer along the left anterolateral aortic wall  Equivocal stranding adjacent the proximal third of the sigmoid colon which could be early acute diverticulitis or fat scarring from a previous episode. Imaging findings of gastroenteritis. Ill-defined area of enhancement in the posterior liver dome, 2.1 x 1.9 x 1.5 cm; Increased haziness in the mesenteric root fat without adenopathy   05/24/2020 Echo LVEF 55-60%.   # 2/27 23 MRI abdomen right hepatic lobe vascular shunting  or benign focal nodular hyperplasia. No worrisome lesion. Spleen was unremarkable.   It was previously felt that lamotrigine may be related to patient's eosinophilia.  Given that he has not had any seizure like activities, with the permission from  his neurologist, patient started to taper down lamotrigine.  He decreased to lamotrigine 50 mg daily for about a week, and then taper down to 25 mg daily.  He was completely tapered off lamotrigine on 06/15/2021. 07/02/2021, WBC 19.4, absolute eosinophil 8.  07/06/2021, patient has experienced seizure activity.  Vision impairment, word difficulties, no responsive but awake.   07/07/2021, ED evaluation with recurrent seizure- activity,.  Patient was started on Keppra.  His MRI brain was unrevealing. 07/08/2021 patient was seen by neurology Dr. Manuella Ghazi, increased Keppra to 500 mg by mouth twice daily.  Work-up with HES showed Normal tryptase, B12, peripheral flowcytometry showed marked eosinophilia. There is no immunophenotypic  evidence of abnormal myeloid maturation Negative EBV DNA, , Negative MPN/HES panel [ PDGFRA,  PDGFRB, FGFR1 normal],  JAK2 V617F mutation negative, with reflex to other mutations CALR, MPL, JAK 2 Ex 12-15 mutations negative, negative BCR ABL1 FISH,  negative ANA, CRP, slightly increased ESR. Increased IgG level, polyclonal increase of immunoglobulins. Negative ANCA,  Slight increase of serum free light chain ration, 24 hour UPEP showed no M protein. negative hepatitis panel,  Previously drug induced eosinophilia was considered and lamotrigin was felt to be a cause. Despite being off lamotrigine, wbc/eosinophilia continues to increase.   07/10/2021 seen by me again   + additional seizure episodes.  + low blood pressure with position changes.  + Finger turning blue + decline of functional status over the past few weeks.  decision made to proceed with bone marrow biopsy.   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   Cytogenetics showed loss of Y chromosome. # [employer] FISH analysis showed negative PDGFRa, , PDGFRb, FGFR1, CBFB.    INTERVAL HISTORY Cory [name] is a 66 y.o. male who has above history reviewed by me today presents for follow up visit for hypereosinophilia.  08/12/2021 CBC showed absolute eosinophil count 10.4, Negative Strongyloides antibody.  08/19/2021 started on prednisone 32m/kg - 681mdaily.  08/26/2021 CBC showed normalization of eosinophil 0.0. Prednisone was decreased to 40 mg daily. 09/11/2021, normal absolute eosinophil counts. Patient was accompanied by wife.  He is overall doing better.  Appetite has improved.  Energy level has improved.   Review of Systems  Constitutional:  Negative for appetite change, chills, fatigue, fever and unexpected weight change.  HENT:   Negative for hearing loss and voice change.   Eyes:  Negative for eye problems and icterus.  Respiratory:  Negative for chest tightness, cough and shortness of breath.   Cardiovascular:  Negative for chest pain and leg swelling.  Gastrointestinal:  Negative for abdominal distention and abdominal pain.  Endocrine: Negative for hot flashes.  Genitourinary:  Negative for difficulty urinating, dysuria and frequency.   Musculoskeletal:  Positive for gait problem. Negative for arthralgias.  Skin:  Negative for itching and rash.  Neurological:  Positive for gait problem and seizures. Negative for light-headedness and numbness.  Hematological:  Negative for adenopathy. Does not bruise/bleed easily.  Psychiatric/Behavioral:  Negative for confusion.     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 [facility])    ED (erectile dysfunction)    Eosinophilia    GERD (gastroesophageal reflux disease)    rare -    History of BPH    History  of genital warts    History of solitary pulmonary nodule    Left and right lung nodules on CT in ER 1/20; stable by CT 2/21   Hypertriglyceridemia    Microscopic hematuria    Other chronic pain    Parkinson's disease ([place])    Pneumonia    age 13   Post-traumatic osteoarthritis of left knee    Rash    legs- on going   Seizure-like activity ([place])    Umbilical hernia     SURGICAL HISTORY: Past Surgical History:  Procedure Laterality Date   APPENDECTOMY     BACK SURGERY     posterior lumbar spine fusion L4-5   COLONOSCOPY     COLONOSCOPY WITH PROPOFOL N/A 06/06/2017   Procedure: COLONOSCOPY WITH PROPOFOL;  Surgeon: Manya Silvas, MD;  Location: [facility] ENDOSCOPY;  Service: Endoscopy;  Laterality: N/A;   KNEE ARTHROSCOPY Bilateral    ACL   KNEE ARTHROSCOPY W/ ACL RECONSTRUCTION     RADIAL KERATOTOMY     ROTATOR CUFF REPAIR Bilateral    SHOULDER ARTHROSCOPY WITH BICEPS TENDON REPAIR Left 04/16/2019   Procedure: SHOULDER ARTHROSCOPY WITH BICEPS TENDON REPAIR, MINI OPEN SUPERIOR CAPSULAR RECONSTRUCTION, BICEPS TENODESIS;  Surgeon: Leim Fabry, MD;  Location: [facility] ORS;  Service: Orthopedics;  Laterality: Left;    SOCIAL HISTORY: Social History   Socioeconomic History   Marital status: Married    Spouse name: Not on file   Number of children: Not on file   Years of education: Not on file   Highest education level: Not on file  Occupational History   Not on file  Tobacco Use   Smoking status: Some Days    Packs/day: 0.25    Years: 23.00    Total pack years: 5.75    Types: Cigarettes, Cigars   Smokeless tobacco: Never   Tobacco comments:    smokes 1 cigar per day 04/30/2016  Vaping Use   Vaping Use: Never used  Substance and Sexual Activity   Alcohol use: Not Currently    Alcohol/week: 1.0 standard drink of alcohol    Types: 1 Shots of liquor per week    Comment: has not had in 2 years   Drug use: No   Sexual activity: Not on file  Other Topics Concern   Not on file   Social History Narrative   Not on file   Social Determinants of Health   Financial Resource Strain: Not on file  Food Insecurity: Not on file  Transportation Needs: Not on file  Physical Activity: Not on file  Stress: Not on file  Social Connections: Not on file  Intimate Partner Violence: Not on file    FAMILY HISTORY: Family History  Problem Relation Age of Onset   Hypertension Mother    Breast cancer Mother    Lung cancer Father    Breast cancer Sister     ALLERGIES:  has No Known Allergies.  MEDICATIONS:  Current Outpatient Medications  Medication Sig Dispense Refill   Amantadine  HCl 100 MG tablet Take 100 mg by mouth 3 (three) times daily.     aspirin EC 81 MG tablet Take 81 mg by mouth daily. Swallow whole.     azelastine (ASTELIN) 0.1 % nasal spray Place 1-2 sprays into both nostrils as needed.     carbidopa-levodopa (SINEMET CR) 50-200 MG tablet Take 1 tablet by mouth at bedtime.     carbidopa-levodopa (SINEMET IR) 25-100 MG tablet Take 2 tablets by mouth 3 (three) times daily.      cyanocobalamin 1000 MCG tablet Take 1 tablet (1,000 mcg total) by mouth daily. 30 tablet 0   entacapone (COMTAN) 200 MG tablet Take 200 mg by mouth 3 (three) times daily.     fluticasone-salmeterol (ADVAIR) 250-50 MCG/ACT AEPB Inhale 1 puff into the lungs as needed.     Fluticasone-Umeclidin-Vilant (TRELEGY ELLIPTA) 100-62.5-25 MCG/INH AEPB Inhale 1 puff into the lungs daily as needed.     levETIRAcetam (KEPPRA) 250 MG tablet Take 1 tablet (250 mg total) by mouth 2 (two) times daily. 60 tablet 1   memantine (NAMENDA) 5 MG tablet Take 5 mg by mouth daily.     midodrine (PROAMATINE) 2.5 MG tablet Take 2.5-5 mg by mouth See admin instructions. Take 44m at breakfast (as needed), 2.536mat lunch (as needed) and take 2.37m737mt supper (as needed) for low blood pressure     MYRBETRIQ 50 MG TB24 tablet Take 50 mg by mouth at bedtime.     NAYZILAM 5 MG/0.1ML SOLN Place into both nostrils.      omeprazole (PRILOSEC) 20 MG capsule Take 1 capsule (20 mg total) by mouth daily. 30 capsule 2   pravastatin (PRAVACHOL) 10 MG tablet Take 10 mg by mouth daily.     predniSONE (DELTASONE) 10 MG tablet Take 1 tablet (10 mg total) by mouth daily with breakfast. Take 13m70mily [ one 10mg77m one 20mg 37met]. 30 tablet 0   predniSONE (DELTASONE) 20 MG tablet Take 2 tablets (40 mg total) by mouth daily. 60 tablet 0   sertraline (ZOLOFT) 50 MG tablet Take 50 mg by mouth daily.     tamsulosin (FLOMAX) 0.4 MG CAPS capsule Take 1 capsule (0.4 mg total) by mouth daily after supper. (Patient taking differently: Take 0.4 mg by mouth 2 (two) times daily.) 10 capsule 0   tiZANidine (ZANAFLEX) 4 MG tablet Take 4 mg by mouth daily as needed for muscle spasms.      levETIRAcetam (KEPPRA) 500 MG tablet Take 500 mg by mouth 2 (two) times daily. Take along with 250mg f60motal of 750mg (P60mnt not taking: Reported on 09/11/2021)     No current facility-administered medications for this visit.     PHYSICAL EXAMINATION: ECOG PERFORMANCE STATUS: 2 - Symptomatic, <50% confined to bed Vitals:   09/11/21 1206  BP: 127/76  Pulse: 74  Resp: 18  Temp: (!) 97 F (36.1 C)   Filed Weights   09/11/21 1206  Weight: 155 lb 3.2 oz (70.4 kg)    Physical Exam Constitutional:      General: He is not in acute distress.    Comments: Sits in a wheelchair.   HENT:     Head: Normocephalic and atraumatic.  Eyes:     General: No scleral icterus. Cardiovascular:     Rate and Rhythm: Normal rate and regular rhythm.     Heart sounds: Normal heart sounds.  Pulmonary:     Effort: Pulmonary effort is normal. No respiratory distress.     Breath  sounds: No wheezing.  Abdominal:     General: Bowel sounds are normal. There is no distension.     Palpations: Abdomen is soft.  Musculoskeletal:        General: No deformity. Normal range of motion.     Cervical back: Normal range of motion and neck supple.     Comments: cogwheel  rigidity  Skin:    General: Skin is warm and dry.     Findings: No erythema or rash.  Neurological:     Mental Status: He is alert and oriented to person, place, and time.  Psychiatric:        Mood and Affect: Mood normal.     LABORATORY DATA:  I have reviewed the data as listed Lab Results  Component Value Date   WBC 17.9 (H) 09/11/2021   HGB 14.3 09/11/2021   HCT 44.6 09/11/2021   MCV 89.9 09/11/2021   PLT 226 09/11/2021   Recent Labs    09/15/20 1615 04/15/21 1308 04/27/21 1037 07/06/21 1117 07/31/21 1054  NA 137   < > 139 137 137  K 3.9   < > 3.1* 4.9 4.6  CL 103   < > 98 102 106  CO2 26   < > 30 28 25   GLUCOSE 126*   < > 84 101* 130*  BUN 17   < > 15 34* 41*  CREATININE 1.25*   < > 1.25* 2.01* 2.07*  CALCIUM 9.3   < > 8.8* 9.1 8.8*  GFRNONAA >60   < > >60 36* 35*  PROT 7.7  --  8.8* 9.2*  --   ALBUMIN 4.2  --  4.0 3.6  --   AST 16  --  13* 13*  --   ALT <5  --  <5 <5  --   ALKPHOS 88  --  107 104  --   BILITOT 0.7  --  0.5 0.8  --    < > = values in this interval not displayed.    Iron/TIBC/Ferritin/ %Sat No results found for: "IRON", "TIBC", "FERRITIN", "IRONPCTSAT"    RADIOGRAPHIC STUDIES: I have personally reviewed the radiological images as listed and agreed with the findings in the report. MR CARDIAC MORPHOLOGY W WO CONTRAST  Result Date: 09/09/2021 CLINICAL DATA:  hypereosinophilic syndrome, evaluate for cardiac involvement EXAM: CARDIAC MRI TECHNIQUE: The patient was scanned on a 1.5 Tesla Siemens magnet. A dedicated cardiac coil was used. Functional imaging was done using Fiesta sequences. 2,3, and 4 chamber views were done to assess for RWMA's. Modified Simpson's rule using a short axis stack was used to calculate an ejection fraction on a dedicated work Conservation officer, nature. The patient received 10 cc of Gadavist. After 10 minutes inversion recovery sequences were used to assess for infiltration and scar tissue. CONTRAST:  10 cc  of  Gadavist FINDINGS: 1. Normal left ventricular size, thickness and systolic function (LVEF = 56%). There are no regional wall motion abnormalities. There is no late gadolinium enhancement in the left ventricular myocardium. No hyper-enhancement noted on T2 sequences LVEDV: 91 ml LVESV: 40 ml SV: 51 ml CO: 4.1L/min Myocardial mass: 79g 2. Normal right ventricular size, thickness and systolic function (RVEF = 50%). There are no regional wall motion abnormalities. 3.  Normal left and right atrial size. 4. Normal size of the aortic root, ascending aorta and pulmonary artery. 5.  No significant valvular abnormalities. 6.  Normal pericardium.  No pericardial effusion. IMPRESSION: 1.  Normal LV and  RV size and function.  LVEF 56%. 2.  No evidence for myocardial fibrosis or infiltration. 3.  No valvular abnormalities noted. 4.  No evidence for LV thrombus. 5.  No evidence for hypereosinophilic syndrome. Electronically Signed   By: Kate Sable M.D.   On: 09/09/2021 18:49   VAS [place] AAA DUPLEX  Result Date: 08/20/2021 ABDOMINAL AORTA STUDY Patient Name:  Cory [name]  Date of Exam:   08/14/2021 Medical Rec #: [id number]         Accession #:    [id number] Date of Birth: 1956-01-21         Patient Gender: M Patient Age:   43 years Exam Location:  [facility] Procedure:      VAS [place] AAA DUPLEX Referring Phys: Leotis Pain --------------------------------------------------------------------------------  Indications: Hx Thoracic aorta aneurysm.  Performing Technologist: Concha Norway RVT  Examination Guidelines: A complete evaluation includes B-mode imaging, spectral Doppler, color Doppler, and power Doppler as needed of all accessible portions of each vessel. Bilateral testing is considered an integral part of a complete examination. Limited examinations for reoccurring indications may be performed as noted.  Abdominal Aorta Findings: +-----------+-------+----------+----------+---------+--------+--------+  Location   AP (cm)Trans (cm)PSV (cm/s)Waveform ThrombusComments +-----------+-------+----------+----------+---------+--------+--------+ Proximal   2.01   2.15                                          +-----------+-------+----------+----------+---------+--------+--------+ Mid        1.61   1.59                                          +-----------+-------+----------+----------+---------+--------+--------+ Distal     1.48   1.50                                          +-----------+-------+----------+----------+---------+--------+--------+ RT CIA Prox0.8    1.0       99        triphasic                 +-----------+-------+----------+----------+---------+--------+--------+ LT CIA Prox0.8    0.8       125       triphasic                 +-----------+-------+----------+----------+---------+--------+--------+  Summary: Abdominal Aorta: No evidence of an abdominal aortic aneurysm was visualized. The largest aortic measurement is 2.2 cm. Mild atherosclerosis.  *See table(s) above for measurements and observations.  Electronically signed by Leotis Pain MD on 08/20/2021 at 12:21:46 PM.   Final       cc Adin Hector, MD

## 2021-09-14 ENCOUNTER — Encounter: Payer: Medicare PPO | Admitting: Speech Pathology

## 2021-09-16 ENCOUNTER — Encounter: Payer: Medicare PPO | Admitting: Speech Pathology

## 2021-09-21 ENCOUNTER — Encounter: Payer: Medicare PPO | Admitting: Speech Pathology

## 2021-09-23 ENCOUNTER — Encounter: Payer: Medicare PPO | Admitting: Speech Pathology

## 2021-09-25 ENCOUNTER — Inpatient Hospital Stay: Payer: Medicare PPO

## 2021-09-25 ENCOUNTER — Other Ambulatory Visit: Payer: Self-pay | Admitting: Oncology

## 2021-09-25 DIAGNOSIS — D72119 Hypereosinophilic syndrome (hes), unspecified: Secondary | ICD-10-CM | POA: Diagnosis not present

## 2021-09-25 LAB — CBC WITH DIFFERENTIAL/PLATELET
Abs Immature Granulocytes: 0.62 10*3/uL — ABNORMAL HIGH (ref 0.00–0.07)
Basophils Absolute: 0.1 10*3/uL (ref 0.0–0.1)
Basophils Relative: 0 %
Eosinophils Absolute: 0 10*3/uL (ref 0.0–0.5)
Eosinophils Relative: 0 %
HCT: 46 % (ref 39.0–52.0)
Hemoglobin: 15 g/dL (ref 13.0–17.0)
Immature Granulocytes: 3 %
Lymphocytes Relative: 7 %
Lymphs Abs: 1.6 10*3/uL (ref 0.7–4.0)
MCH: 29.1 pg (ref 26.0–34.0)
MCHC: 32.6 g/dL (ref 30.0–36.0)
MCV: 89.3 fL (ref 80.0–100.0)
Monocytes Absolute: 1 10*3/uL (ref 0.1–1.0)
Monocytes Relative: 4 %
Neutro Abs: 19.6 10*3/uL — ABNORMAL HIGH (ref 1.7–7.7)
Neutrophils Relative %: 86 %
Platelets: 213 10*3/uL (ref 150–400)
RBC: 5.15 MIL/uL (ref 4.22–5.81)
RDW: 15.3 % (ref 11.5–15.5)
WBC: 22.9 10*3/uL — ABNORMAL HIGH (ref 4.0–10.5)
nRBC: 0 % (ref 0.0–0.2)

## 2021-09-28 ENCOUNTER — Telehealth: Payer: Self-pay

## 2021-09-28 ENCOUNTER — Encounter: Payer: Medicare PPO | Admitting: Speech Pathology

## 2021-09-30 ENCOUNTER — Encounter: Payer: Medicare PPO | Admitting: Speech Pathology

## 2021-10-09 ENCOUNTER — Inpatient Hospital Stay: Payer: Medicare PPO | Admitting: Oncology

## 2021-10-09 ENCOUNTER — Encounter: Payer: Self-pay | Admitting: Oncology

## 2021-10-09 ENCOUNTER — Inpatient Hospital Stay: Payer: Medicare PPO | Attending: Oncology

## 2021-10-09 VITALS — BP 90/58 | HR 91 | Temp 97.5°F | Resp 18 | Wt 154.5 lb

## 2021-10-09 DIAGNOSIS — R569 Unspecified convulsions: Secondary | ICD-10-CM | POA: Diagnosis not present

## 2021-10-09 DIAGNOSIS — Z803 Family history of malignant neoplasm of breast: Secondary | ICD-10-CM | POA: Insufficient documentation

## 2021-10-09 DIAGNOSIS — D72119 Hypereosinophilic syndrome (hes), unspecified: Secondary | ICD-10-CM | POA: Diagnosis not present

## 2021-10-09 DIAGNOSIS — G20 Parkinson's disease: Secondary | ICD-10-CM

## 2021-10-09 LAB — CBC WITH DIFFERENTIAL/PLATELET
Abs Immature Granulocytes: 0.38 10*3/uL — ABNORMAL HIGH (ref 0.00–0.07)
Basophils Absolute: 0.1 10*3/uL (ref 0.0–0.1)
Basophils Relative: 0 %
Eosinophils Absolute: 0.2 10*3/uL (ref 0.0–0.5)
Eosinophils Relative: 1 %
HCT: 44.7 % (ref 39.0–52.0)
Hemoglobin: 14.7 g/dL (ref 13.0–17.0)
Immature Granulocytes: 2 %
Lymphocytes Relative: 20 %
Lymphs Abs: 4.1 10*3/uL — ABNORMAL HIGH (ref 0.7–4.0)
MCH: 29.7 pg (ref 26.0–34.0)
MCHC: 32.9 g/dL (ref 30.0–36.0)
MCV: 90.3 fL (ref 80.0–100.0)
Monocytes Absolute: 1.1 10*3/uL — ABNORMAL HIGH (ref 0.1–1.0)
Monocytes Relative: 5 %
Neutro Abs: 15 10*3/uL — ABNORMAL HIGH (ref 1.7–7.7)
Neutrophils Relative %: 72 %
Platelets: 208 10*3/uL (ref 150–400)
RBC: 4.95 MIL/uL (ref 4.22–5.81)
RDW: 14.5 % (ref 11.5–15.5)
WBC: 20.8 10*3/uL — ABNORMAL HIGH (ref 4.0–10.5)
nRBC: 0 % (ref 0.0–0.2)

## 2021-10-09 MED ORDER — OMEPRAZOLE 20 MG PO CPDR: 20.0000 mg | DELAYED_RELEASE_CAPSULE | Freq: Every day | ORAL | 2 refills | Status: DC

## 2021-10-09 MED ORDER — PREDNISONE 10 MG PO TABS: 30.0000 mg | ORAL_TABLET | Freq: Every day | ORAL | 1 refills | Status: DC

## 2021-10-10 NOTE — Assessment & Plan Note (Signed)
Follow up with Neurology. Continue Keppra

## 2021-10-10 NOTE — Assessment & Plan Note (Addendum)
Follow-up with neurology 

## 2021-10-10 NOTE — Addendum Note (Signed)
Addended by: Earlie Server on: 10/10/2021 03:06 PM   Modules accepted: Orders

## 2021-10-10 NOTE — Assessment & Plan Note (Addendum)
Unclear if his current symptoms are due to hypereosinophilia syndrome.  eosinophil level remains stable and normal. Need to rule out adrenal insufficiency.  I will check morning cortisol level.  Recommend patient to increase prednisone to '30mg'$  daily.  I called patient and wife on 10/10/21 and they agree with the plan.

## 2021-10-10 NOTE — Progress Notes (Signed)
Hematology/Oncology Progress note [phone] [phone]         Patient Care Team: Adin Hector, MD as PCP - General (Internal Medicine) Earlie Server, MD as Consulting Physician (Hematology and Oncology) Lucky Cowboy Erskine Squibb, MD as Consulting Physician (Vascular Surgery) Vladimir Crofts, MD as Consulting Physician (Neurology) Darl Pikes as Referring Physician (Physician Assistant)  REFERRING PROVIDER: Adin Hector, MD  CHIEF COMPLAINTS/REASON FOR VISIT:  Follow up of eosinophilia  ASSESSMENT & PLAN:   Hypereosinophilic syndrome (hes), unspecified Unclear if his current symptoms are due to hypereosinophilia syndrome.  eosinophil level remains stable and normal. Need to rule out adrenal insufficiency.  I will check morning cortisol level.  Recommend patient to increase prednisone to 53m daily.  I called patient and wife on 10/10/21 and they agree with the plan.   Parkinson disease ([facility] Follow-up with neurology.    [place]. Continue Keppra    Orders Placed This Encounter  Procedures   CBC with Differential/Platelet    Standing Status:   Future    Standing Expiration Date:   10/10/2022   Comprehensive metabolic panel    Standing Status:   Future    Standing Expiration Date:   10/09/2022    Lab MD 4 weeks, check morning cortisol level.  ZEarlie Server MD, PhD [facility]cology 10/09/2021    HISTORY OF PRESENTING ILLNESS:   Cory ZEEKis a  66y.o.  male with PMH listed below was seen in consultation at the request of  KAdin Hector MD  for evaluation of eosinophilia  Patient has a history of chronic eosinophilia.  Previous lab results were reviewed. Eosinophilia dated back to January 2021, progressively getting worse. He also has chronic basophilia. Patient has a history of asthma for which he follows up with pulmonology.  He reports that symptoms are well controlled.  He rarely has to  use inhalers.  Denies wheezing, shortness of breath. Patient has Parkinson's disease, on carbidopa/levodopa, amantadine, Comptan  patient has symptoms of dyskinesia, balancing, cognitive impairment.  He is on /memantine for cognitive impairment. He also has episodes of orthostatic hypotension.  Has been on midodrine 2.5 mg twice daily. 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 He reports appetite has been good.  Weight has been stable.  No fever or chills, night sweats, skin rash.  Patient smokes 1 to 2 cigarettes every 2 weeks  04/15/2021, CT angiogram chest abdomen pelvis 1. 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 No other aortic aneurysm is seen.There is mild aortic atherosclerosis in the arch segment with mixed plaque, and moderate abdominal aortic mixed plaque. 2. 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 There is no dissection, and no further penetrating ulcers. 3. Equivocal stranding adjacent the proximal third of the sigmoid colon which could be early acute diverticulitis or fat scarring from a previous episode 4. Imaging findings of gastroenteritis 5. Ill-defined area of enhancement in the posterior liver dome measuring 2.1 x 1.9 x 1.5 cm  04/15/21 CT chest/abd/pel angiogram showed aneurysm aortic root, penetrating atherosclerotic ulcer along the left anterolateral aortic wall  Equivocal stranding adjacent the proximal third of the sigmoid colon which could be early acute diverticulitis or fat scarring from  a previous episode. Imaging findings of gastroenteritis. Ill-defined area of enhancement in the posterior liver dome, 2.1 x 1.9 x 1.5 cm; Increased haziness in the mesenteric root fat without adenopathy   05/24/2020 Echo LVEF 55-60%.   # 2/27 23  MRI abdomen right hepatic lobe vascular shunting or benign focal nodular hyperplasia. No worrisome lesion. Spleen was unremarkable.   It was previously felt that lamotrigine may be related to patient's eosinophilia.  Given that he has not had any seizure like activities, with the permission from his neurologist, patient started to taper down lamotrigine.  He decreased to lamotrigine 50 mg daily for about a week, and then taper down to 25 mg daily.  He was completely tapered off lamotrigine on 06/15/2021. 07/02/2021, WBC 19.4, absolute eosinophil 8.  07/06/2021, patient has experienced seizure activity.  Vision impairment, word difficulties, no responsive but awake.   07/07/2021, ED evaluation with recurrent seizure- activity,.  Patient was started on Keppra.  His MRI brain was unrevealing. 07/08/2021 patient was seen by neurology Dr. Manuella Ghazi, increased Keppra to 500 mg by mouth twice daily.  Work-up with HES showed Normal tryptase, B12, peripheral flowcytometry showed marked eosinophilia. There is no immunophenotypic  evidence of abnormal myeloid maturation Negative EBV DNA, , Negative MPN/HES panel [ PDGFRA,  PDGFRB, FGFR1 normal],  JAK2 V617F mutation negative, with reflex to other mutations CALR, MPL, JAK 2 Ex 12-15 mutations negative, negative BCR ABL1 FISH,  negative ANA, CRP, slightly increased ESR. Increased IgG level, polyclonal increase of immunoglobulins. Negative ANCA,  Slight increase of serum free light chain ration, 24 hour UPEP showed no M protein. negative hepatitis panel,  Previously drug induced eosinophilia was considered and lamotrigin was felt to be a cause. Despite being off lamotrigine, wbc/eosinophilia continues to increase.   07/10/2021 seen by me again   + additional seizure episodes.  + low blood pressure with position changes.  + Finger turning blue + decline of functional status over the past few weeks.  decision made to proceed with bone marrow biopsy.   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   Cytogenetics showed loss of Y chromosome. # [employer] FISH analysis showed negative PDGFRa, , PDGFRb, FGFR1, CBFB.   08/12/2021 CBC showed absolute eosinophil count 10.4, Negative Strongyloides antibody.  08/19/2021 started on prednisone 33m/kg - 640mdaily.  08/26/2021 CBC showed normalization of eosinophil 0.0. Prednisone was decreased to 40 mg daily. 09/11/2021, normal absolute eosinophil counts.   INTERVAL HISTORY TiALEXI GEIBELs a 89.o. male who has above history reviewed by me today presents for follow up visit for hypereosinophilia.  09/09/2021 MR cardiac morphology w wo contrast showed no evidence for myocardial fibrosis or infiltration 09/11/2021 prednisone tapered down to 3068m 09/28/2021 prednisone tapered down to 63m91mily. Patient has again experienced weakness, unsteady gait. His blood pressure also trends low. +he is on midodrine    Review of Systems  Constitutional:  Negative for appetite change, chills, fatigue, fever and unexpected weight change.  HENT:   Negative for hearing loss and voice change.   Eyes:  Negative for eye problems and icterus.  Respiratory:  Negative for chest tightness, cough and shortness of breath.   Cardiovascular:  Negative for chest pain and leg swelling.  Gastrointestinal:  Negative for abdominal distention and abdominal pain.  Endocrine: Negative for hot flashes.  Genitourinary:  Negative for difficulty urinating, dysuria and frequency.   Musculoskeletal:  Positive for gait problem. Negative for arthralgias.  Skin:  Negative for itching and rash.  Neurological:  Positive for gait problem and seizures. Negative for light-headedness and numbness.  Hematological:  Negative for adenopathy. Does not bruise/bleed easily.  Psychiatric/Behavioral:  Negative for confusion.     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 [facility])    ED (erectile dysfunction)    Eosinophilia    GERD (gastroesophageal reflux disease)    rare -    History of BPH    History of genital warts    History of solitary pulmonary nodule    Left and right lung nodules on CT in ER 1/20; stable by CT 2/21   Hypertriglyceridemia    Microscopic hematuria    Other chronic pain    Parkinson's disease ([place])    Pneumonia    age 54   Post-traumatic osteoarthritis of left knee    Rash    legs- on going   Seizure-like activity ([place])    Umbilical hernia     SURGICAL HISTORY: Past Surgical History:  Procedure Laterality Date   APPENDECTOMY     BACK SURGERY     posterior lumbar spine fusion L4-5   COLONOSCOPY     COLONOSCOPY WITH PROPOFOL N/A 06/06/2017   Procedure: COLONOSCOPY WITH PROPOFOL;  Surgeon: Manya Silvas, MD;  [facility] ENDOSCOPY;  Service: Endoscopy;  Laterality: N/A;   KNEE ARTHROSCOPY Bilateral    ACL   KNEE ARTHROSCOPY W/ ACL RECONSTRUCTION     RADIAL KERATOTOMY     ROTATOR CUFF REPAIR Bilateral    SHOULDER ARTHROSCOPY WITH BICEPS TENDON REPAIR Left 04/16/2019   Procedure: SHOULDER ARTHROSCOPY WITH BICEPS TENDON REPAIR, MINI OPEN SUPERIOR CAPSULAR RECONSTRUCTION, BICEPS TENODESIS;  Surgeon: Leim Fabry, MD;  Location: [facility] ORS;  Service: Orthopedics;  Laterality: Left;    SOCIAL HISTORY: Social History   Socioeconomic History   Marital status: Married    Spouse name: Not on file   Number of children: Not on file   Years of education: Not on file   Highest education level: Not on file  Occupational History   Not on file  Tobacco Use   Smoking status:  Some Days    Packs/day: 0.25    Years: 23.00    Total pack years: 5.75    Types: Cigarettes, Cigars   Smokeless tobacco: Never   Tobacco comments:    smokes 1 cigar per day 04/30/2016  Vaping Use   Vaping Use: Never used  Substance and Sexual Activity   Alcohol use: Not Currently    Alcohol/week: 1.0 standard drink of alcohol    Types: 1 Shots of liquor per week    Comment: has not had in 2 years   Drug use: No   Sexual activity: Not on file  Other Topics Concern   Not on file  Social History Narrative   Not on file   Social Determinants of Health   Financial Resource Strain: Not on file  Food Insecurity: Not on file  Transportation Needs: Not on file  Physical Activity: Not on file  Stress: Not on file  Social Connections: Not on file  Intimate Partner Violence: Not on file    FAMILY HISTORY: Family History  Problem Relation Age of Onset   Hypertension Mother    Breast cancer Mother    Lung cancer Father    Breast cancer Sister     ALLERGIES:  has No Known Allergies.  MEDICATIONS:  Current Outpatient Medications  Medication Sig Dispense Refill   Amantadine HCl 100 MG tablet Take 100 mg by mouth 3 (three) times daily.     aspirin EC 81 MG tablet Take 81 mg by mouth daily. Swallow whole.     azelastine (ASTELIN) 0.1 % nasal spray Place 1-2 sprays into both nostrils as needed.     carbidopa-levodopa (SINEMET CR) 50-200 MG tablet Take 1 tablet by mouth at bedtime.     carbidopa-levodopa (SINEMET IR) 25-100 MG tablet Take 2 tablets by mouth 3 (three) times daily.      cyanocobalamin 1000 MCG tablet Take 1 tablet (1,000 mcg total) by mouth daily. 30 tablet 0   docusate sodium (COLACE) 100 MG capsule Take 100 mg by mouth daily.     entacapone (COMTAN) 200 MG tablet Take 200 mg by mouth 3 (three) times daily.     fluticasone-salmeterol (ADVAIR) 250-50 MCG/ACT AEPB Inhale 1 puff into the lungs as needed.     Fluticasone-Umeclidin-Vilant (TRELEGY ELLIPTA) 100-62.5-25  MCG/INH AEPB Inhale 1 puff into the lungs daily as needed.     levETIRAcetam (KEPPRA) 250 MG tablet Take 1 tablet (250 mg total) by mouth 2 (two) times daily. 60 tablet 1   levETIRAcetam (KEPPRA) 500 MG tablet Take 500 mg by mouth 2 (two) times daily. Take along with 233m for total of 7544m    memantine (NAMENDA) 5 MG tablet Take 5 mg by mouth daily.     midodrine (PROAMATINE) 2.5 MG tablet Take 2.5-5 mg by mouth See admin instructions. Take 5m50mt breakfast (as needed), 2.5mg10m lunch (as needed) and take 2.5mg 18msupper (as needed) for low blood pressure     MYRBETRIQ 50 MG TB24 tablet Take 50 mg by mouth at bedtime.     pravastatin (PRAVACHOL) 10 MG tablet Take 10 mg by mouth daily.     sertraline (ZOLOFT) 50 MG tablet Take 50 mg by mouth daily.     tamsulosin (FLOMAX) 0.4 MG CAPS capsule Take 1 capsule (0.4 mg total) by mouth daily after supper. (Patient taking differently: Take 0.4 mg by mouth 2 (two) times daily.) 10 capsule 0   tiZANidine (ZANAFLEX) 4 MG tablet Take 4 mg by mouth daily as needed for muscle spasms.      NAYZILAM 5 MG/0.1ML SOLN Place into both nostrils. (Patient not taking: Reported on 10/09/2021)     omeprazole (PRILOSEC) 20 MG capsule Take 1 capsule (20 mg total) by mouth daily. 30 capsule 2   predniSONE (DELTASONE) 10 MG tablet Take 3 tablets (30 mg total) by mouth daily with breakfast. Take 30mg 9my [ one 10mg a29mne 20mg ta58m]. 90 tablet 1   No current facility-administered medications for this visit.     PHYSICAL EXAMINATION: ECOG PERFORMANCE STATUS: 2 - Symptomatic, <50% confined to bed Vitals:   10/09/21 1003  BP: (!) 90/58  Pulse: 91  Resp: 18  Temp: (!) 97.5 F (36.4 C)   Filed Weights   10/09/21 1003  Weight: 154 lb 8 oz (70.1 kg)    Physical Exam Constitutional:      General: He is  not in acute distress.    Comments: Sits in a wheelchair.   HENT:     Head: Normocephalic and atraumatic.  Eyes:     General: No scleral  icterus. Cardiovascular:     Rate and Rhythm: Normal rate and regular rhythm.     Heart sounds: Normal heart sounds.  Pulmonary:     Effort: Pulmonary effort is normal. No respiratory distress.     Breath sounds: No wheezing.  Abdominal:     General: Bowel sounds are normal. There is no distension.     Palpations: Abdomen is soft.  Musculoskeletal:        General: No deformity. Normal range of motion.     Cervical back: Normal range of motion and neck supple.     Comments: cogwheel rigidity  Skin:    General: Skin is warm and dry.     Findings: No erythema or rash.  Neurological:     Mental Status: He is alert and oriented to person, place, and time.  Psychiatric:        Mood and Affect: Mood normal.     LABORATORY DATA:  I have reviewed the data as listed Lab Results  Component Value Date   WBC 20.8 (H) 10/09/2021   HGB 14.7 10/09/2021   HCT 44.7 10/09/2021   MCV 90.3 10/09/2021   PLT 208 10/09/2021   Recent Labs    04/27/21 1037 07/06/21 1117 07/31/21 1054  NA 139 137 137  K 3.1* 4.9 4.6  CL 98 102 106  CO2 _0 GLUCOSE 84 101* 130*  BUN 15 34* 41*  CREATININE 1.25* 2.01* 2.07*  CALCIUM 8.8* 9.1 8.8*  GFRNONAA >60 36* 35*  PROT 8.8* 9.2*  --   ALBUMIN 4.0 3.6  --   AST 13* 13*  --   ALT <5 <5  --   ALKPHOS 107 104  --   BILITOT 0.5 0.8  --     Iron/TIBC/Ferritin/ %Sat No results found for: "IRON", "TIBC", "FERRITIN", "IRONPCTSAT"    RADIOGRAPHIC STUDIES: I have personally reviewed the radiological images as listed and agreed with the findings in the report. No results found.MR CARDIAC MORPHOLOGY W WO CONTRAST  Result Date: 09/09/2021 CLINICAL DATA:  hypereosinophilic syndrome, evaluate for cardiac involvement EXAM: CARDIAC MRI TECHNIQUE: The patient was scanned on a 1.5 Tesla Siemens magnet. A dedicated cardiac coil was used. Functional imaging was done using Fiesta sequences. 2,3, and 4 chamber views were done to assess for RWMA's. Modified  Simpson's rule using a short axis stack was used to calculate an ejection fraction on a dedicated work Conservation officer, nature. The patient received 10 cc of Gadavist. After 10 minutes inversion recovery sequences were used to assess for infiltration and scar tissue. CONTRAST:  10 cc  of Gadavist FINDINGS: 1. Normal left ventricular size, thickness and systolic function (LVEF = 56%). There are no regional wall motion abnormalities. There is no late gadolinium enhancement in the left ventricular myocardium. No hyper-enhancement noted on T2 sequences LVEDV: 91 ml LVESV: 40 ml SV: 51 ml CO: 4.1L/min Myocardial mass: 79g 2. Normal right ventricular size, thickness and systolic function (RVEF = 50%). There are no regional wall motion abnormalities. 3.  Normal left and right atrial size. 4. Normal size of the aortic root, ascending aorta and pulmonary artery. 5.  No significant valvular abnormalities. 6.  Normal pericardium.  No pericardial effusion. IMPRESSION: 1.  Normal LV and RV size and function.  LVEF 56%. 2.  No evidence for myocardial fibrosis or infiltration. 3.  No valvular abnormalities noted. 4.  No evidence for LV thrombus. 5.  No evidence for hypereosinophilic syndrome. Electronically Signed   By: Kate Sable M.D.   On: 09/09/2021 18:49   VAS [place] AAA DUPLEX  Result Date: 08/20/2021 ABDOMINAL AORTA STUDY Patient Name:  Cory [name]  Date of Exam:   08/14/2021 Medical Rec #: [id number]         Accession #:    [id number] Date of Birth: 02-Mar-1956         Patient Gender: M Patient Age:   26 years Exam Location:  [facility] Procedure:      VAS [place] AAA DUPLEX Referring Phys: Leotis Pain --------------------------------------------------------------------------------  Indications: Hx Thoracic aorta aneurysm.  Performing Technologist: Concha Norway RVT  Examination Guidelines: A complete evaluation includes B-mode imaging, spectral Doppler, color Doppler, and power Doppler as needed of all  accessible portions of each vessel. Bilateral testing is considered an integral part of a complete examination. Limited examinations for reoccurring indications may be performed as noted.  Abdominal Aorta Findings: +-----------+-------+----------+----------+---------+--------+--------+ Location   AP (cm)Trans (cm)PSV (cm/s)Waveform ThrombusComments +-----------+-------+----------+----------+---------+--------+--------+ Proximal   2.01   2.15                                          +-----------+-------+----------+----------+---------+--------+--------+ Mid        1.61   1.59                                          +-----------+-------+----------+----------+---------+--------+--------+ Distal     1.48   1.50                                          +-----------+-------+----------+----------+---------+--------+--------+ RT CIA Prox0.8    1.0       99        triphasic                 +-----------+-------+----------+----------+---------+--------+--------+ LT CIA Prox0.8    0.8       125       triphasic                 +-----------+-------+----------+----------+---------+--------+--------+  Summary: Abdominal Aorta: No evidence of an abdominal aortic aneurysm was visualized. The largest aortic measurement is 2.2 cm. Mild atherosclerosis.  *See table(s) above for measurements and observations.  Electronically signed by Leotis Pain MD on 08/20/2021 at 12:21:46 PM.   Final    MR Brain W Wo Contrast  Result Date: 08/15/2021 CLINICAL DATA:  Seizures. Parkinson's disease. Gait disturbance. Hypereosinophilic syndrome, unspecified type. EXAM: MRI HEAD WITHOUT AND WITH CONTRAST TECHNIQUE: Multiplanar, multiecho pulse sequences of the brain and surrounding structures were obtained without and with intravenous contrast. CONTRAST:  58m GADAVIST GADOBUTROL 1 MMOL/ML IV SOLN COMPARISON:  07/06/2021 FINDINGS: Brain: No acute infarct, mass effect or extra-axial collection. No acute or  chronic hemorrhage. There is multifocal hyperintense T2-weighted signal within the white matter. Generalized cerebral volume loss. The midline structures are normal. There is no abnormal contrast enhancement. Vascular: Major flow voids are preserved. Skull and upper cervical spine: Normal calvarium and skull base. Visualized upper cervical spine and  soft tissues are normal. Sinuses/Orbits:No paranasal sinus fluid levels or advanced mucosal thickening. No mastoid or middle ear effusion. Normal orbits. IMPRESSION: 1. No acute intracranial abnormality. 2. Generalized cerebral volume loss and findings of chronic microvascular ischemia. Electronically Signed   By: Ulyses Jarred M.D.   On: 08/15/2021 17:05   CT BONE MARROW BIOPSY & ASPIRATION  Result Date: 07/21/2021 INDICATION: Eosinophilia of uncertain etiology. Please perform CT-guided bone biopsy for tissue diagnostic purposes. EXAM: CT-GUIDED BONE MARROW BIOPSY AND ASPIRATION MEDICATIONS: None ANESTHESIA/SEDATION: Fentanyl 75 mcg IV; Versed 1.5 mg IV Sedation Time: 10 Minutes; The patient was continuously monitored during the procedure by the interventional radiology nurse under my direct supervision. COMPLICATIONS: None immediate. PROCEDURE: Informed consent was obtained from the patient following an explanation of the procedure, risks, benefits and alternatives. The patient understands, agrees and consents for the procedure. All questions were addressed. A time out was performed prior to the initiation of the procedure. The patient was positioned prone and non-contrast localization CT was performed of the pelvis to demonstrate the iliac marrow spaces. The operative site was prepped and draped in the usual sterile fashion. Under sterile conditions and local anesthesia, a 22 gauge spinal needle was utilized for procedural planning. Next, an 11 gauge coaxial bone biopsy needle was advanced into the left iliac marrow space. Needle position was confirmed with CT  imaging. Initially, a bone marrow aspiration was performed. Next, a bone marrow biopsy was obtained with the 11 gauge outer bone marrow device. The needle was removed and superficial hemostasis was obtained with manual compression. A dressing was applied. The patient tolerated the procedure well without immediate post procedural complication. IMPRESSION: Successful CT guided left iliac bone marrow aspiration and core biopsy. Electronically Signed   By: Sandi Mariscal M.D.   On: 07/21/2021 15:12      cc Adin Hector, MD

## 2021-10-12 ENCOUNTER — Telehealth: Payer: Self-pay

## 2021-10-12 NOTE — Telephone Encounter (Signed)
Please schedule lab encounter for today, 7/10 or tomorrow 7/11. Please inform patient of lab appt. He already is aware of the plan Per Dr. Tasia Catchings.

## 2021-10-12 NOTE — Telephone Encounter (Signed)
-----   Message from Earlie Server, MD sent at 10/10/2021  3:00 PM EDT ----- Please add lab encounter 7/10, or 7/11, encounter before 9 AM, earlier is better Morning cortisol level, ACTH level, CMP,  I ordered with date of 7/10. Please communicate with lab that I only need these 3 labs to be drawn, no other labs needed.  Patient is aware about the plan.

## 2021-10-13 ENCOUNTER — Inpatient Hospital Stay: Payer: Medicare PPO

## 2021-10-14 NOTE — Telephone Encounter (Signed)
Called pt to schedule lab appt. Pt scheduled for 7/14 @ 9am

## 2021-10-14 NOTE — Telephone Encounter (Signed)
Per message from [place]: WIFE LEFT MSG WITH ANSWERING SERVICE THAT A NURSE WAS SUPPOSED TO CALL AND LET HIM KNOW WHEN AND WHERE FOR BLOODWORK   Pt missed lab appt on 7/11, sounds like they were not aware of appt. Please call pt's wife to r/s lab appt

## 2021-10-16 ENCOUNTER — Inpatient Hospital Stay: Payer: Medicare PPO

## 2021-10-16 DIAGNOSIS — D72119 Hypereosinophilic syndrome (hes), unspecified: Secondary | ICD-10-CM | POA: Diagnosis not present

## 2021-10-16 LAB — CBC WITH DIFFERENTIAL/PLATELET
Abs Immature Granulocytes: 0.89 10*3/uL — ABNORMAL HIGH (ref 0.00–0.07)
Basophils Absolute: 0.1 10*3/uL (ref 0.0–0.1)
Basophils Relative: 1 %
Eosinophils Absolute: 0.1 10*3/uL (ref 0.0–0.5)
Eosinophils Relative: 1 %
HCT: 45.1 % (ref 39.0–52.0)
Hemoglobin: 14.7 g/dL (ref 13.0–17.0)
Immature Granulocytes: 5 %
Lymphocytes Relative: 24 %
Lymphs Abs: 4.5 10*3/uL — ABNORMAL HIGH (ref 0.7–4.0)
MCH: 29.2 pg (ref 26.0–34.0)
MCHC: 32.6 g/dL (ref 30.0–36.0)
MCV: 89.7 fL (ref 80.0–100.0)
Monocytes Absolute: 1.2 10*3/uL — ABNORMAL HIGH (ref 0.1–1.0)
Monocytes Relative: 7 %
Neutro Abs: 11.5 10*3/uL — ABNORMAL HIGH (ref 1.7–7.7)
Neutrophils Relative %: 62 %
Platelets: 182 10*3/uL (ref 150–400)
RBC: 5.03 MIL/uL (ref 4.22–5.81)
RDW: 14.6 % (ref 11.5–15.5)
WBC: 18.4 10*3/uL — ABNORMAL HIGH (ref 4.0–10.5)
nRBC: 0 % (ref 0.0–0.2)

## 2021-10-16 LAB — COMPREHENSIVE METABOLIC PANEL
ALT: 5 U/L (ref 0–44)
AST: 19 U/L (ref 15–41)
Albumin: 3.5 g/dL (ref 3.5–5.0)
Alkaline Phosphatase: 60 U/L (ref 38–126)
Anion gap: 8 (ref 5–15)
BUN: 39 mg/dL — ABNORMAL HIGH (ref 8–23)
CO2: 23 mmol/L (ref 22–32)
Calcium: 8.5 mg/dL — ABNORMAL LOW (ref 8.9–10.3)
Chloride: 105 mmol/L (ref 98–111)
Creatinine, Ser: 1.54 mg/dL — ABNORMAL HIGH (ref 0.61–1.24)
GFR, Estimated: 49 mL/min — ABNORMAL LOW (ref >60)
Glucose, Bld: 84 mg/dL (ref 70–99)
Potassium: 4.3 mmol/L (ref 3.5–5.1)
Sodium: 136 mmol/L (ref 135–145)
Total Bilirubin: 0.6 mg/dL (ref 0.3–1.2)
Total Protein: 6.4 g/dL — ABNORMAL LOW (ref 6.5–8.1)

## 2021-10-16 LAB — CORTISOL: Cortisol, Plasma: 4.7 ug/dL

## 2021-10-19 LAB — ACTH: C206 ACTH: 18.2 pg/mL (ref 7.2–63.3)

## 2021-10-28 ENCOUNTER — Telehealth: Payer: Self-pay | Admitting: *Deleted

## 2021-10-28 NOTE — Telephone Encounter (Signed)
Call returned to Cory [name] her doctor response, she thanked me for letting her know and for Dr Collie Siad commitment

## 2021-10-28 NOTE — Telephone Encounter (Signed)
Per Dr. Tasia Catchings, at last visit she ran test which results were good. She feels this BP issue could be caused by his Parkinson disease. He was just seen by cardiologist which Dr. Tasia Catchings defers to. She advises if patient is blacking out and is seeing stars to ED. Also reach out to PCP.

## 2021-10-28 NOTE — Telephone Encounter (Signed)
Cory [name] called reporting that patient is not doing well, His b/p continues to drop and he is getting weaker and weaker. He had a hard fall yesterday, he is blacking out and seeing stars. [facility] cannot see him until September 1st. Dr Manuella Ghazi put him on a new medicine for his bp Midodrine. She is asking what else can be done? Please advkse

## 2021-11-06 ENCOUNTER — Inpatient Hospital Stay: Payer: Medicare PPO | Attending: Oncology

## 2021-11-06 ENCOUNTER — Encounter: Payer: Self-pay | Admitting: Oncology

## 2021-11-06 ENCOUNTER — Inpatient Hospital Stay: Payer: Medicare PPO | Admitting: Oncology

## 2021-11-06 VITALS — BP 83/60 | HR 99 | Temp 97.0°F | Resp 16 | Wt 155.0 lb

## 2021-11-06 DIAGNOSIS — G20 Parkinson's disease: Secondary | ICD-10-CM

## 2021-11-06 DIAGNOSIS — I951 Orthostatic hypotension: Secondary | ICD-10-CM | POA: Insufficient documentation

## 2021-11-06 DIAGNOSIS — D72110 Idiopathic hypereosinophilic syndrome (ihes): Secondary | ICD-10-CM

## 2021-11-06 DIAGNOSIS — Z7952 Long term (current) use of systemic steroids: Secondary | ICD-10-CM | POA: Diagnosis not present

## 2021-11-06 DIAGNOSIS — R911 Solitary pulmonary nodule: Secondary | ICD-10-CM

## 2021-11-06 DIAGNOSIS — J45909 Unspecified asthma, uncomplicated: Secondary | ICD-10-CM | POA: Insufficient documentation

## 2021-11-06 DIAGNOSIS — Z79899 Other long term (current) drug therapy: Secondary | ICD-10-CM | POA: Diagnosis not present

## 2021-11-06 DIAGNOSIS — Z7982 Long term (current) use of aspirin: Secondary | ICD-10-CM | POA: Insufficient documentation

## 2021-11-06 DIAGNOSIS — F17210 Nicotine dependence, cigarettes, uncomplicated: Secondary | ICD-10-CM | POA: Diagnosis not present

## 2021-11-06 DIAGNOSIS — D72119 Hypereosinophilic syndrome (hes), unspecified: Secondary | ICD-10-CM | POA: Insufficient documentation

## 2021-11-06 LAB — COMPREHENSIVE METABOLIC PANEL
ALT: 7 U/L (ref 0–44)
AST: 16 U/L (ref 15–41)
Albumin: 3.5 g/dL (ref 3.5–5.0)
Alkaline Phosphatase: 68 U/L (ref 38–126)
Anion gap: 10 (ref 5–15)
BUN: 37 mg/dL — ABNORMAL HIGH (ref 8–23)
CO2: 24 mmol/L (ref 22–32)
Calcium: 8.8 mg/dL — ABNORMAL LOW (ref 8.9–10.3)
Chloride: 105 mmol/L (ref 98–111)
Creatinine, Ser: 1.86 mg/dL — ABNORMAL HIGH (ref 0.61–1.24)
GFR, Estimated: 39 mL/min — ABNORMAL LOW (ref >60)
Glucose, Bld: 113 mg/dL — ABNORMAL HIGH (ref 70–99)
Potassium: 4.4 mmol/L (ref 3.5–5.1)
Sodium: 139 mmol/L (ref 135–145)
Total Bilirubin: 0.5 mg/dL (ref 0.3–1.2)
Total Protein: 6.5 g/dL (ref 6.5–8.1)

## 2021-11-06 LAB — CBC WITH DIFFERENTIAL/PLATELET
Abs Immature Granulocytes: 1.1 10*3/uL — ABNORMAL HIGH (ref 0.00–0.07)
Basophils Absolute: 0.1 10*3/uL (ref 0.0–0.1)
Basophils Relative: 1 %
Eosinophils Absolute: 0.1 10*3/uL (ref 0.0–0.5)
Eosinophils Relative: 0 %
HCT: 44.8 % (ref 39.0–52.0)
Hemoglobin: 14.8 g/dL (ref 13.0–17.0)
Immature Granulocytes: 5 %
Lymphocytes Relative: 9 %
Lymphs Abs: 2.1 10*3/uL (ref 0.7–4.0)
MCH: 29.8 pg (ref 26.0–34.0)
MCHC: 33 g/dL (ref 30.0–36.0)
MCV: 90.1 fL (ref 80.0–100.0)
Monocytes Absolute: 0.9 10*3/uL (ref 0.1–1.0)
Monocytes Relative: 4 %
Neutro Abs: 18.2 10*3/uL — ABNORMAL HIGH (ref 1.7–7.7)
Neutrophils Relative %: 81 %
Platelets: 177 10*3/uL (ref 150–400)
RBC: 4.97 MIL/uL (ref 4.22–5.81)
RDW: 14.6 % (ref 11.5–15.5)
WBC: 22.4 10*3/uL — ABNORMAL HIGH (ref 4.0–10.5)
nRBC: 0 % (ref 0.0–0.2)

## 2021-11-06 MED ORDER — OMEPRAZOLE 20 MG PO CPDR: 20.0000 mg | DELAYED_RELEASE_CAPSULE | Freq: Every day | ORAL | 2 refills | Status: AC

## 2021-11-06 NOTE — Progress Notes (Signed)
Pt reports 2 falls this morning at home along with 2 bad falls at in the last 2 weeks.  Pt is heavily bruised on left lower back region.  Pt also reports orthostatic hypotension.

## 2021-11-06 NOTE — Progress Notes (Signed)
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 Recommend patient to continue prednisone to 56m daily.    Orthostatic hypotension Likely due to autonomic nervous dysfunction, maybe related to parkinson's disease.  Normal cortisol, and ACTH.  Continue midodrine Encourage oral hydration.    Parkinson disease ([facility] Follow-up with neurology.      Orders Placed This Encounter  Procedures   CBC with Differential/Platelet    Standing Status:   Future    Standing Expiration Date:   11/07/2022   Comprehensive metabolic panel    Standing Status:   Future    Standing Expiration Date:   11/07/2022    Lab MD 4 weeks, check morning cortisol level.  ZEarlie Server MD, PhD [facility] On[facility] 11/06/2021    HISTORY OF PRESENTING ILLNESS:   TFORREST JAROSZEWSKIis a  66y.o.  male with PMH listed below was seen in consultation at the request of  KAdin Hector MD  for evaluation of eosinophilia  Patient has a history of chronic eosinophilia.  Previous lab results were reviewed. Eosinophilia dated back to January 2021, progressively getting worse. He also has chronic basophilia. Patient has a history of asthma for which he follows up with pulmonology.  He reports that symptoms are well controlled.  He rarely has to use inhalers.  Denies wheezing, shortness of breath. Patient has Parkinson's disease, on  carbidopa/levodopa, amantadine, Comptan  patient has symptoms of dyskinesia, balancing, cognitive impairment.  He is on /memantine for cognitive impairment. He also has episodes of orthostatic hypotension.  Has been on midodrine 2.5 mg twice daily. 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 He reports appetite has been good.  Weight has been stable.  No fever or chills, night sweats, skin rash.  Patient smokes 1 to 2 cigarettes every 2 weeks  04/15/2021, CT angiogram chest abdomen pelvis 1. 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 No other aortic aneurysm is seen.There is mild aortic atherosclerosis in the arch segment with mixed plaque, and moderate abdominal aortic mixed plaque. 2. 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 There is no dissection, and no further penetrating ulcers. 3. Equivocal stranding adjacent the proximal third of the sigmoid colon which could be early acute diverticulitis or fat scarring from a previous episode 4. Imaging findings of gastroenteritis 5. Ill-defined area of enhancement in the posterior liver dome measuring 2.1 x 1.9 x 1.5 cm  04/15/21 CT chest/abd/pel angiogram showed aneurysm aortic root, penetrating atherosclerotic ulcer along the left anterolateral aortic wall  Equivocal stranding adjacent the proximal third of the sigmoid colon which could be early acute diverticulitis or fat scarring from a previous episode. Imaging findings of gastroenteritis. Ill-defined area of enhancement in the posterior liver dome, 2.1 x 1.9 x  1.5 cm; Increased haziness in the mesenteric root fat without adenopathy   05/24/2020 Echo LVEF 55-60%.   # 2/27 23 MRI abdomen right hepatic lobe vascular shunting or benign focal nodular hyperplasia. No  worrisome lesion. Spleen was unremarkable.   It was previously felt that lamotrigine may be related to patient's eosinophilia.  Given that he has not had any seizure like activities, with the permission from his neurologist, patient started to taper down lamotrigine.  He decreased to lamotrigine 50 mg daily for about a week, and then taper down to 25 mg daily.  He was completely tapered off lamotrigine on 06/15/2021. 07/02/2021, WBC 19.4, absolute eosinophil 8.  07/06/2021, patient has experienced seizure activity.  Vision impairment, word difficulties, no responsive but awake.   07/07/2021, ED evaluation with recurrent seizure- activity,.  Patient was started on Keppra.  His MRI brain was unrevealing. 07/08/2021 patient was seen by neurology Dr. Manuella Ghazi, increased Keppra to 500 mg by mouth twice daily.  Work-up with HES showed Normal tryptase, B12, peripheral flowcytometry showed marked eosinophilia. There is no immunophenotypic  evidence of abnormal myeloid maturation Negative EBV DNA, , Negative MPN/HES panel [ PDGFRA,  PDGFRB, FGFR1 normal],  JAK2 V617F mutation negative, with reflex to other mutations CALR, MPL, JAK 2 Ex 12-15 mutations negative, negative BCR ABL1 FISH,  negative ANA, CRP, slightly increased ESR. Increased IgG level, polyclonal increase of immunoglobulins. Negative ANCA,  Slight increase of serum free light chain ration, 24 hour UPEP showed no M protein. negative hepatitis panel,  Previously drug induced eosinophilia was considered and lamotrigin was felt to be a cause. Despite being off lamotrigine, wbc/eosinophilia continues to increase.   07/10/2021 seen by me again   + additional seizure episodes.  + low blood pressure with position changes.  + Finger turning blue + decline of functional status over the past few weeks.  decision made to proceed with bone marrow biopsy.   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   Cytogenetics showed loss of Y chromosome. # [employer] FISH analysis showed negative PDGFRa, , PDGFRb, FGFR1, CBFB.   08/12/2021 CBC showed absolute eosinophil count 10.4, Negative Strongyloides antibody.  08/19/2021 started on prednisone 41m/kg - 667mdaily.  08/26/2021 CBC showed normalization of eosinophil 0.0. Prednisone was decreased to 40 mg daily. 09/11/2021, normal absolute eosinophil counts. 09/09/2021 MR cardiac morphology w wo contrast showed no evidence for myocardial fibrosis or infiltration 09/11/2021 prednisone tapered down to 3081m 09/28/2021 prednisone tapered down to 41m81mily. 10/09/2021 prednisone 30mg41mly.   INTERVAL HISTORY Cory [name] 66 y.20 male who has above history reviewed by me today presents for follow up visit for hypereosinophilia. He continues to have hypotension, frequent falls. - he had a large area of bruising on his left flank area after a recent fall. He feels lightheaded after changing positions.  Cardiology feels that hypotension is likely due to parkinson's disease.  +he is on midodrine 5mg T1m      Review of Systems  Constitutional:  Positive for fatigue. Negative for appetite change, chills, fever and unexpected weight change.  HENT:   Negative for hearing loss and voice change.   Eyes:  Negative for eye problems and icterus.  Respiratory:  Negative for chest tightness, cough and shortness of breath.   Cardiovascular:  Negative for chest pain and leg swelling.  Gastrointestinal:  Negative for abdominal distention and abdominal pain.  Endocrine: Negative for hot flashes.  Genitourinary:  Negative for difficulty urinating, dysuria and frequency.   Musculoskeletal:  Positive for gait problem. Negative for arthralgias.  Skin:  Negative for itching and rash.  Neurological:  Positive for gait problem and seizures. Negative for light-headedness and numbness.       Falls   Hematological:  Negative for adenopathy. Does not bruise/bleed easily.  Psychiatric/Behavioral:  Negative for confusion.     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 [facility])    ED (erectile dysfunction)    Eosinophilia    GERD (gastroesophageal reflux disease)    rare -    History of BPH    History of genital warts    History of solitary pulmonary nodule    Left and right lung nodules on CT in ER 1/20; stable by CT 2/21   Hypertriglyceridemia    Microscopic hematuria    Other chronic pain    Parkinson's disease ([place])    Pneumonia    age 35   Post-traumatic osteoarthritis of left knee    Rash    legs- on going   Seizure-like activity ([place])    Umbilical hernia     SURGICAL HISTORY: Past Surgical History:  Procedure Laterality Date   APPENDECTOMY     BACK SURGERY     posterior lumbar spine fusion L4-5   COLONOSCOPY     COLONOSCOPY WITH PROPOFOL N/A 06/06/2017   Procedure: COLONOSCOPY WITH PROPOFOL;  Surgeon: Manya Silvas, MD;  Location: [facility] ENDOSCOPY;  Service: Endoscopy;  Laterality: N/A;   KNEE ARTHROSCOPY Bilateral    ACL   KNEE ARTHROSCOPY W/ ACL RECONSTRUCTION     RADIAL KERATOTOMY     ROTATOR CUFF REPAIR Bilateral    SHOULDER ARTHROSCOPY WITH BICEPS TENDON REPAIR Left 04/16/2019   Procedure: SHOULDER ARTHROSCOPY WITH BICEPS TENDON REPAIR, MINI OPEN SUPERIOR CAPSULAR RECONSTRUCTION, BICEPS TENODESIS;  Surgeon: Leim Fabry, MD;  Location: [facility] ORS;  Service: Orthopedics;  Laterality: Left;    SOCIAL HISTORY: Social History   Socioeconomic History   Marital status: Married    Spouse name: Not on file   Number of children: Not  on file   Years of education: Not on file   Highest education level: Not on file  Occupational History   Not on file  Tobacco Use   Smoking status: Some Days    Packs/day: 0.25    Years: 23.00    Total pack years: 5.75    Types: Cigarettes, Cigars   Smokeless tobacco: Never   Tobacco comments:    smokes 1 cigar per day 04/30/2016  Vaping Use   Vaping Use: Never used  Substance and Sexual Activity   Alcohol use: Not Currently    Alcohol/week: 1.0 standard drink of alcohol    Types: 1 Shots of liquor per week    Comment: has not had in 2 years   Drug use: No   Sexual activity: Not on file  Other Topics Concern   Not on file  Social History Narrative   Not on file   Social Determinants of Health  Financial Resource Strain: Not on file  Food Insecurity: Not on file  Transportation Needs: Not on file  Physical Activity: Not on file  Stress: Not on file  Social Connections: Not on file  Intimate Partner Violence: Not on file    FAMILY HISTORY: Family History  Problem Relation Age of Onset   Hypertension Mother    Breast cancer Mother    Lung cancer Father    Breast cancer Sister     ALLERGIES:  has No Known Allergies.  MEDICATIONS:  Current Outpatient Medications  Medication Sig Dispense Refill   aspirin EC 81 MG tablet Take 81 mg by mouth daily. Swallow whole.     azelastine (ASTELIN) 0.1 % nasal spray Place 1-2 sprays into both nostrils as needed.     carbidopa-levodopa (SINEMET CR) 50-200 MG tablet Take 1 tablet by mouth at bedtime.     carbidopa-levodopa (SINEMET IR) 25-100 MG tablet Take 2 tablets by mouth 3 (three) times daily.      cyanocobalamin 1000 MCG tablet Take 1 tablet (1,000 mcg total) by mouth daily. 30 tablet 0   docusate sodium (COLACE) 100 MG capsule Take 100 mg by mouth daily.     entacapone (COMTAN) 200 MG tablet Take 200 mg by mouth 3 (three) times daily.     fluticasone-salmeterol (ADVAIR) 250-50 MCG/ACT AEPB Inhale 1 puff into the lungs as  needed.     Fluticasone-Umeclidin-Vilant (TRELEGY ELLIPTA) 100-62.5-25 MCG/INH AEPB Inhale 1 puff into the lungs daily as needed.     levETIRAcetam (KEPPRA) 250 MG tablet Take 1 tablet (250 mg total) by mouth 2 (two) times daily. 60 tablet 1   levETIRAcetam (KEPPRA) 500 MG tablet Take 500 mg by mouth 2 (two) times daily. Take along with 249m for total of 7524m    memantine (NAMENDA) 5 MG tablet Take 5 mg by mouth daily.     midodrine (PROAMATINE) 5 MG tablet Take 5 mg by mouth in the morning, at noon, and at bedtime.     MYRBETRIQ 50 MG TB24 tablet Take 50 mg by mouth at bedtime.     predniSONE (DELTASONE) 10 MG tablet Take 3 tablets (30 mg total) by mouth daily with breakfast. Take 3063maily [ one 65m79md one 20mg11mlet]. 90 tablet 1   sertraline (ZOLOFT) 50 MG tablet Take 50 mg by mouth daily.     tamsulosin (FLOMAX) 0.4 MG CAPS capsule Take 1 capsule (0.4 mg total) by mouth daily after supper. (Patient taking differently: Take 0.4 mg by mouth 2 (two) times daily.) 10 capsule 0   tiZANidine (ZANAFLEX) 4 MG tablet Take 4 mg by mouth daily as needed for muscle spasms.      NAYZILAM 5 MG/0.1ML SOLN Place into both nostrils. (Patient not taking: Reported on 11/06/2021)     omeprazole (PRILOSEC) 20 MG capsule Take 1 capsule (20 mg total) by mouth daily. 30 capsule 2   No current facility-administered medications for this visit.     PHYSICAL EXAMINATION: ECOG PERFORMANCE STATUS: 2 - Symptomatic, <50% confined to bed Vitals:   11/06/21 1207 11/06/21 1208  BP: 108/78 (!) 83/60  Pulse: 95 99  Resp: 16   Temp: (!) 97 F (36.1 C)   SpO2: 97%    Filed Weights   11/06/21 1207  Weight: 155 lb (70.3 kg)    Physical Exam Constitutional:      General: He is not in acute distress.    Comments: Sits in a wheelchair.   HENT:  Head: Normocephalic and atraumatic.  Eyes:     General: No scleral icterus. Cardiovascular:     Rate and Rhythm: Normal rate and regular rhythm.     Heart  sounds: Normal heart sounds.  Pulmonary:     Effort: Pulmonary effort is normal. No respiratory distress.     Breath sounds: No wheezing.  Abdominal:     General: Bowel sounds are normal. There is no distension.     Palpations: Abdomen is soft.  Musculoskeletal:        General: No deformity. Normal range of motion.     Cervical back: Normal range of motion and neck supple.     Comments: cogwheel rigidity  Skin:    General: Skin is warm and dry.     Findings: No erythema or rash.     Comments: Left posterior flank bruising  Neurological:     Mental Status: He is alert and oriented to person, place, and time.  Psychiatric:        Mood and Affect: Mood normal.    LABORATORY DATA:  I have reviewed the data as listed     Latest Ref Rng & Units 11/06/2021   11:38 AM 10/16/2021    9:04 AM 10/09/2021    9:48 AM  CBC  WBC 4.0 - 10.5 K/uL 22.4  18.4  20.8   Hemoglobin 13.0 - 17.0 g/dL 14.8  14.7  14.7   Hematocrit 39.0 - 52.0 % 44.8  45.1  44.7   Platelets 150 - 400 K/uL 177  182  208       Latest Ref Rng & Units 11/06/2021   11:38 AM 10/16/2021    9:04 AM 07/31/2021   10:54 AM  CMP  Glucose 70 - 99 mg/dL 113  84  130   BUN 8 - 23 mg/dL 37  39  41   Creatinine 0.61 - 1.24 mg/dL 1.86  1.54  2.07   Sodium 135 - 145 mmol/L 139  136  137   Potassium 3.5 - 5.1 mmol/L 4.4  4.3  4.6   Chloride 98 - 111 mmol/L 105  105  106   CO2 22 - 32 mmol/L _0 Calcium 8.9 - 10.3 mg/dL 8.8  8.5  8.8   Total Protein 6.5 - 8.1 g/dL 6.5  6.4    Total Bilirubin 0.3 - 1.2 mg/dL 0.5  0.6    Alkaline Phos 38 - 126 U/L 68  60    AST 15 - 41 U/L 16  19    ALT 0 - 44 U/L 7  5         RADIOGRAPHIC STUDIES: I have personally reviewed the radiological images as listed and agreed with the findings in the report. No results found.MR CARDIAC MORPHOLOGY W WO CONTRAST  Result Date: 09/09/2021 CLINICAL DATA:  hypereosinophilic syndrome, evaluate for cardiac involvement EXAM: CARDIAC MRI TECHNIQUE: The  patient was scanned on a 1.5 Tesla Siemens magnet. A dedicated cardiac coil was used. Functional imaging was done using Fiesta sequences. 2,3, and 4 chamber views were done to assess for RWMA's. Modified Simpson's rule using a short axis stack was used to calculate an ejection fraction on a dedicated work Conservation officer, nature. The patient received 10 cc of Gadavist. After 10 minutes inversion recovery sequences were used to assess for infiltration and scar tissue. CONTRAST:  10 cc  of Gadavist FINDINGS: 1. Normal left ventricular size, thickness and systolic function (LVEF = 56%).  There are no regional wall motion abnormalities. There is no late gadolinium enhancement in the left ventricular myocardium. No hyper-enhancement noted on T2 sequences LVEDV: 91 ml LVESV: 40 ml SV: 51 ml CO: 4.1L/min Myocardial mass: 79g 2. Normal right ventricular size, thickness and systolic function (RVEF = 50%). There are no regional wall motion abnormalities. 3.  Normal left and right atrial size. 4. Normal size of the aortic root, ascending aorta and pulmonary artery. 5.  No significant valvular abnormalities. 6.  Normal pericardium.  No pericardial effusion. IMPRESSION: 1.  Normal LV and RV size and function.  LVEF 56%. 2.  No evidence for myocardial fibrosis or infiltration. 3.  No valvular abnormalities noted. 4.  No evidence for LV thrombus. 5.  No evidence for hypereosinophilic syndrome. Electronically Signed   By: Kate Sable M.D.   On: 09/09/2021 18:49   VAS [place] AAA DUPLEX  Result Date: 08/20/2021 ABDOMINAL AORTA STUDY Patient Name:  Cory [name]  Date of Exam:   08/14/2021 Medical Rec #: [id number]         Accession #:    [id number] Date of Birth: [birth year]         Patient Gender: M Patient Age:   78 years Exam Location:  [facility] Procedure:      VAS [place] AAA DUPLEX Referring Phys: Leotis Pain --------------------------------------------------------------------------------  Indications: Hx  Thoracic aorta aneurysm.  Performing Technologist: Concha Norway RVT  Examination Guidelines: A complete evaluation includes B-mode imaging, spectral Doppler, color Doppler, and power Doppler as needed of all accessible portions of each vessel. Bilateral testing is considered an integral part of a complete examination. Limited examinations for reoccurring indications may be performed as noted.  Abdominal Aorta Findings: +-----------+-------+----------+----------+---------+--------+--------+ Location   AP (cm)Trans (cm)PSV (cm/s)Waveform ThrombusComments +-----------+-------+----------+----------+---------+--------+--------+ Proximal   2.01   2.15                                          +-----------+-------+----------+----------+---------+--------+--------+ Mid        1.61   1.59                                          +-----------+-------+----------+----------+---------+--------+--------+ Distal     1.48   1.50                                          +-----------+-------+----------+----------+---------+--------+--------+ RT CIA Prox0.8    1.0       99        triphasic                 +-----------+-------+----------+----------+---------+--------+--------+ LT CIA Prox0.8    0.8       125       triphasic                 +-----------+-------+----------+----------+---------+--------+--------+  Summary: Abdominal Aorta: No evidence of an abdominal aortic aneurysm was visualized. The largest aortic measurement is 2.2 cm. Mild atherosclerosis.  *See table(s) above for measurements and observations.  Electronically signed by Leotis Pain MD on 08/20/2021 at 12:21:46 PM.   Final    MR Brain W Wo Contrast  Result Date: 08/15/2021 CLINICAL DATA:  Seizures. Parkinson's  disease. Gait disturbance. Hypereosinophilic syndrome, unspecified type. EXAM: MRI HEAD WITHOUT AND WITH CONTRAST TECHNIQUE: Multiplanar, multiecho pulse sequences of the brain and surrounding structures were  obtained without and with intravenous contrast. CONTRAST:  21m GADAVIST GADOBUTROL 1 MMOL/ML IV SOLN COMPARISON:  07/06/2021 FINDINGS: Brain: No acute infarct, mass effect or extra-axial collection. No acute or chronic hemorrhage. There is multifocal hyperintense T2-weighted signal within the white matter. Generalized cerebral volume loss. The midline structures are normal. There is no abnormal contrast enhancement. Vascular: Major flow voids are preserved. Skull and upper cervical spine: Normal calvarium and skull base. Visualized upper cervical spine and soft tissues are normal. Sinuses/Orbits:No paranasal sinus fluid levels or advanced mucosal thickening. No mastoid or middle ear effusion. Normal orbits. IMPRESSION: 1. No acute intracranial abnormality. 2. Generalized cerebral volume loss and findings of chronic microvascular ischemia. Electronically Signed   By: KUlyses JarredM.D.   On: 08/15/2021 17:05      cc KAdin Hector MD

## 2021-11-06 NOTE — Assessment & Plan Note (Signed)
eosinophil level remains stable and normal. Recommend patient to continue prednisone to '30mg'$  daily.

## 2021-11-06 NOTE — Assessment & Plan Note (Addendum)
Likely due to autonomic nervous dysfunction, maybe related to parkinson's disease.  Normal cortisol, and ACTH.  Continue midodrine Encourage oral hydration.

## 2021-11-06 NOTE — Assessment & Plan Note (Signed)
Follow-up with neurology 

## 2021-11-25 ENCOUNTER — Ambulatory Visit: Admit: 2021-11-25 | Payer: Medicare PPO | Admitting: Internal Medicine

## 2021-11-25 SURGERY — COLONOSCOPY
Anesthesia: General

## 2021-11-30 ENCOUNTER — Other Ambulatory Visit: Payer: Self-pay | Admitting: Internal Medicine

## 2021-11-30 DIAGNOSIS — R911 Solitary pulmonary nodule: Secondary | ICD-10-CM

## 2021-12-04 ENCOUNTER — Ambulatory Visit
Admission: RE | Admit: 2021-12-04 | Discharge: 2021-12-04 | Disposition: A | Discharge status: Home / Self Care | Payer: Medicare PPO | Source: Ambulatory Visit | Attending: Internal Medicine | Admitting: Internal Medicine

## 2021-12-04 DIAGNOSIS — R911 Solitary pulmonary nodule: Secondary | ICD-10-CM

## 2021-12-16 ENCOUNTER — Inpatient Hospital Stay: Payer: Medicare PPO | Admitting: Oncology

## 2021-12-16 ENCOUNTER — Inpatient Hospital Stay: Payer: Medicare PPO | Attending: Oncology

## 2021-12-16 ENCOUNTER — Encounter: Payer: Self-pay | Admitting: Oncology

## 2021-12-16 VITALS — BP 78/42 | HR 86 | Temp 97.8°F | Resp 20 | Wt 159.9 lb

## 2021-12-16 DIAGNOSIS — D72828 Other elevated white blood cell count: Secondary | ICD-10-CM | POA: Diagnosis not present

## 2021-12-16 DIAGNOSIS — R911 Solitary pulmonary nodule: Secondary | ICD-10-CM

## 2021-12-16 DIAGNOSIS — G20 Parkinson's disease: Secondary | ICD-10-CM

## 2021-12-16 DIAGNOSIS — I951 Orthostatic hypotension: Secondary | ICD-10-CM | POA: Diagnosis not present

## 2021-12-16 DIAGNOSIS — R569 Unspecified convulsions: Secondary | ICD-10-CM

## 2021-12-16 DIAGNOSIS — D72119 Hypereosinophilic syndrome (hes), unspecified: Secondary | ICD-10-CM | POA: Diagnosis present

## 2021-12-16 DIAGNOSIS — N1831 Chronic kidney disease, stage 3a: Secondary | ICD-10-CM

## 2021-12-16 DIAGNOSIS — Z720 Tobacco use: Secondary | ICD-10-CM | POA: Insufficient documentation

## 2021-12-16 DIAGNOSIS — D72110 Idiopathic hypereosinophilic syndrome (ihes): Secondary | ICD-10-CM

## 2021-12-16 DIAGNOSIS — Z7952 Long term (current) use of systemic steroids: Secondary | ICD-10-CM | POA: Insufficient documentation

## 2021-12-16 LAB — CBC WITH DIFFERENTIAL/PLATELET
Abs Immature Granulocytes: 0.72 10*3/uL — ABNORMAL HIGH (ref 0.00–0.07)
Basophils Absolute: 0.1 10*3/uL (ref 0.0–0.1)
Basophils Relative: 0 %
Eosinophils Absolute: 0.1 10*3/uL (ref 0.0–0.5)
Eosinophils Relative: 1 %
HCT: 44.7 % (ref 39.0–52.0)
Hemoglobin: 14.5 g/dL (ref 13.0–17.0)
Immature Granulocytes: 4 %
Lymphocytes Relative: 19 %
Lymphs Abs: 3.3 10*3/uL (ref 0.7–4.0)
MCH: 29.9 pg (ref 26.0–34.0)
MCHC: 32.4 g/dL (ref 30.0–36.0)
MCV: 92.2 fL (ref 80.0–100.0)
Monocytes Absolute: 0.8 10*3/uL (ref 0.1–1.0)
Monocytes Relative: 5 %
Neutro Abs: 12.1 10*3/uL — ABNORMAL HIGH (ref 1.7–7.7)
Neutrophils Relative %: 71 %
Platelets: 163 10*3/uL (ref 150–400)
RBC: 4.85 MIL/uL (ref 4.22–5.81)
RDW: 15 % (ref 11.5–15.5)
WBC: 17 10*3/uL — ABNORMAL HIGH (ref 4.0–10.5)
nRBC: 0 % (ref 0.0–0.2)

## 2021-12-16 LAB — COMPREHENSIVE METABOLIC PANEL
ALT: 6 U/L (ref 0–44)
AST: 20 U/L (ref 15–41)
Albumin: 3.4 g/dL — ABNORMAL LOW (ref 3.5–5.0)
Alkaline Phosphatase: 54 U/L (ref 38–126)
Anion gap: 8 (ref 5–15)
BUN: 34 mg/dL — ABNORMAL HIGH (ref 8–23)
CO2: 23 mmol/L (ref 22–32)
Calcium: 8.7 mg/dL — ABNORMAL LOW (ref 8.9–10.3)
Chloride: 108 mmol/L (ref 98–111)
Creatinine, Ser: 1.72 mg/dL — ABNORMAL HIGH (ref 0.61–1.24)
GFR, Estimated: 43 mL/min — ABNORMAL LOW (ref >60)
Glucose, Bld: 115 mg/dL — ABNORMAL HIGH (ref 70–99)
Potassium: 4.3 mmol/L (ref 3.5–5.1)
Sodium: 139 mmol/L (ref 135–145)
Total Bilirubin: 0.7 mg/dL (ref 0.3–1.2)
Total Protein: 6.1 g/dL — ABNORMAL LOW (ref 6.5–8.1)

## 2021-12-16 MED ORDER — PREDNISONE 10 MG PO TABS: 30.0000 mg | ORAL_TABLET | Freq: Every day | ORAL | 1 refills | Status: AC

## 2021-12-16 NOTE — Assessment & Plan Note (Signed)
Likely due to autonomic nervous dysfunction, maybe related to parkinson's disease.  Unclear if this is a manifestation of HES. Normal cortisol, and ACTH.  Continue midodrine '5mg'$  TID Encourage oral hydration.

## 2021-12-16 NOTE — Assessment & Plan Note (Signed)
Eosinophil level remains stable and normal. Recommend patient to continue prednisone to '30mg'$  daily.  Second opinion at [facility]

## 2021-12-16 NOTE — Assessment & Plan Note (Signed)
Follow up with Neurology. Continue Keppra

## 2021-12-16 NOTE — Assessment & Plan Note (Signed)
Follow-up with neurology 

## 2021-12-16 NOTE — Progress Notes (Signed)
Hematology/Oncology Progress note [phone] [phone]         Patient Care Team: Adin Hector, MD as PCP - General (Internal Medicine) Earlie Server, MD as Consulting Physician (Hematology and Oncology) Lucky Cowboy Erskine Squibb, MD as Consulting Physician (Vascular Surgery) Vladimir Crofts, MD as Consulting Physician (Neurology) Darl Pikes as Referring Physician (Physician Assistant)  REFERRING PROVIDER: Adin Hector, MD  CHIEF COMPLAINTS/REASON FOR VISIT:  Follow up of eosinophilia  ASSESSMENT & PLAN:   Hypereosinophilic syndrome (hes), unspecified Eosinophil level remains stable and normal. Recommend patient to continue prednisone to 36m daily.  [facility]     [place] with [place]. Continue Keppra  Orthostatic hypotension Likely due to autonomic nervous dysfunction, maybe related to parkinson's disease.  Unclear if this is a manifestation of HES. Normal cortisol, and ACTH.  Continue midodrine 585mTID Encourage oral hydration.    [facility] ([facility].      Orders Placed This Encounter  Procedures   CBC with Differential/Platelet    Standing Status:   Future    Standing Expiration Date:   12/17/2022   Comprehensive metabolic panel    Standing Status:   Future    Standing Expiration Date:   12/16/2022    Lab MD 8 weeks We spent sufficient time to discuss many aspect of care, questions were answered to patient's satisfaction. A total of 40 minutes was spent on this visit.  With 5 minutes spent reviewing  results, 25 minutes counseling the patient about the treatment of HES, management of symptoms.  Additional 10 minutes was spent on answering patient and wife's questions.   ZhEarlie ServerMD, PhD [facility] 12/16/2021    HISTORY OF PRESENTING ILLNESS:   TiKEONDRICK DILKSs a  [age].o.  male with PMH listed below was seen in consultation at the request of   KlAdin HectorMD  for evaluation of eosinophilia  Patient has a history of chronic eosinophilia.  Previous lab results were reviewed. Eosinophilia dated back to January 2021, progressively getting worse. He also has chronic basophilia. Patient has a history of asthma for which he follows up with pulmonology.  He reports that symptoms are well controlled.  He rarely has to use inhalers.  Denies wheezing, shortness of breath. Patient has Parkinson's disease, on carbidopa/levodopa, amantadine, Comptan  patient has symptoms of dyskinesia, balancing, cognitive impairment.  He is on /memantine for cognitive impairment. He also has episodes of orthostatic hypotension.  Has been on midodrine 2.5 mg twice daily. 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 He reports appetite has been good.  Weight has been stable.  No fever or chills, night sweats, skin rash.  Patient smokes 1 to 2 cigarettes every 2 weeks  04/15/2021, CT angiogram chest abdomen pelvis 1. 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 No other aortic aneurysm is seen.There is mild aortic atherosclerosis in the arch segment with mixed plaque, and moderate abdominal aortic mixed plaque. 2. 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 There is no dissection, and no further penetrating ulcers. 3. Equivocal stranding adjacent the proximal third of the sigmoid colon which could be early acute diverticulitis or fat scarring from a previous episode  4. Imaging findings of gastroenteritis 5. 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 Imaging findings of gastroenteritis. Ill-defined area of enhancement in the posterior liver dome, 2.1 x 1.9 x 1.5 cm; Increased haziness in the mesenteric root fat without adenopathy   05/24/2020 Echo LVEF 55-60%.   # 2/27 23 MRI abdomen right hepatic lobe vascular shunting or benign focal nodular hyperplasia. No worrisome lesion. Spleen was unremarkable.   It was previously felt that lamotrigine may be related to patient's eosinophilia.  Given that he has not had any seizure like activities, with the permission from his neurologist, patient started to taper down lamotrigine.  He decreased to lamotrigine 50 mg daily for about a week, and then taper down to 25 mg daily.  He was completely tapered off lamotrigine on 06/15/2021. 07/02/2021, WBC 19.4, absolute eosinophil 8.  07/06/2021, patient has experienced seizure activity.  Vision impairment, word difficulties, no responsive but awake.   07/07/2021, ED evaluation with recurrent seizure- activity,.  Patient was started on Keppra.  His MRI brain was unrevealing. 07/08/2021 patient was seen by neurology Dr. Manuella Ghazi, increased Keppra to 500 mg by mouth twice daily.  Work-up with HES showed Normal tryptase, B12, peripheral flowcytometry showed marked eosinophilia. There is no immunophenotypic  evidence of abnormal myeloid maturation Negative EBV DNA, , Negative MPN/HES panel [ PDGFRA,  PDGFRB, FGFR1 normal],  JAK2 V617F mutation negative, with reflex to other mutations CALR, MPL, JAK 2 Ex 12-15 mutations negative, negative BCR ABL1 FISH,  negative ANA, CRP, slightly increased ESR. Increased IgG level, polyclonal increase of immunoglobulins. Negative ANCA,  Slight increase of serum free light chain ration, 24 hour UPEP showed no M protein. negative hepatitis panel,  Previously drug induced eosinophilia was  considered and lamotrigin was felt to be a cause. Despite being off lamotrigine, wbc/eosinophilia continues to increase.   07/10/2021 seen by me again   + additional seizure episodes.  + low blood pressure with position changes.  + Finger turning blue + decline of functional status over the past few weeks.  decision made to proceed with bone marrow biopsy.   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   Cytogenetics showed loss of Y chromosome. # [employer] FISH analysis showed negative PDGFRa, , PDGFRb, FGFR1, CBFB.   08/12/2021 CBC showed absolute eosinophil count 10.4, Negative Strongyloides antibody.  08/19/2021 started on prednisone 10m/kg - 636mdaily.  08/26/2021 CBC showed normalization of eosinophil 0.0. Prednisone was decreased to 40 mg daily. 09/11/2021, normal absolute eosinophil counts. 09/09/2021 MR cardiac morphology w wo contrast showed no evidence for myocardial fibrosis or infiltration 09/11/2021 prednisone tapered down to 3033m 09/28/2021 prednisone tapered down to 28m61mily. 10/09/2021 prednisone 30mg44mly.   INTERVAL HISTORY Cory [name] 66 y.21 male who has above history reviewed by me today presents for follow up visit for hypereosinophilia. He continues to have orthostatic hypotension, frequent falls, multiple bruising area due to fall. No additional seizure episodes. +he is on midodrine 5mg T74m  He does not like drinking water.  He drinks soft drinks, sweet tea and other beverages.  Patient accompanied by wife.  Patient is currently on prednisone 30 mg daily. Patient denies skin rash, abdominal pain, changes of bowel habits, nausea or vomiting, diarrhea.    Review of Systems  Constitutional:  Positive for fatigue. Negative for appetite change, chills, fever and unexpected weight change.  HENT:   Negative for hearing loss and voice change.    Eyes:  Negative for eye problems and icterus.  Respiratory:  Negative for chest tightness, cough and shortness of breath.   Cardiovascular:  Negative for chest pain and leg swelling.  Gastrointestinal:  Negative for abdominal distention and abdominal pain.  Endocrine: Negative for hot flashes.  Genitourinary:  Negative for difficulty urinating, dysuria and frequency.   Musculoskeletal:  Positive for gait problem. Negative for arthralgias.  Skin:  Negative for itching and rash.  Neurological:  Positive for gait problem and seizures. Negative for light-headedness and numbness.       Falls  Hematological:  Negative for adenopathy. Does not bruise/bleed easily.  Psychiatric/Behavioral:  Negative for confusion.     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 [facility])    ED (erectile dysfunction)    Eosinophilia    GERD (gastroesophageal reflux disease)    rare -    History of BPH    History of genital warts    History of solitary pulmonary nodule    Left and right lung nodules on CT in ER 1/20; stable by CT 2/21   Hypertriglyceridemia    Microscopic hematuria    Other chronic pain    Parkinson's disease ([place])    Pneumonia    age 74   Post-traumatic osteoarthritis of left knee    Rash    legs- on going   Seizure-like activity ([place])    Umbilical hernia     SURGICAL HISTORY: Past Surgical History:  Procedure Laterality Date   APPENDECTOMY     BACK SURGERY     posterior lumbar spine fusion L4-5   COLONOSCOPY     COLONOSCOPY WITH PROPOFOL N/A 06/06/2017   Procedure: COLONOSCOPY WITH PROPOFOL;  Surgeon: Manya Silvas, MD;  Location: [facility] ENDOSCOPY;   Service: Endoscopy;  Laterality: N/A;   KNEE ARTHROSCOPY Bilateral    ACL   KNEE ARTHROSCOPY W/ ACL RECONSTRUCTION     RADIAL KERATOTOMY     ROTATOR CUFF REPAIR Bilateral    SHOULDER ARTHROSCOPY WITH BICEPS TENDON REPAIR Left 04/16/2019   Procedure: SHOULDER ARTHROSCOPY WITH BICEPS TENDON REPAIR, MINI OPEN SUPERIOR CAPSULAR RECONSTRUCTION, BICEPS TENODESIS;  Surgeon: Leim Fabry, MD;  Location: [facility] ORS;  Service: Orthopedics;  Laterality: Left;    SOCIAL HISTORY: Social History   Socioeconomic History   Marital status: Married    Spouse name: Not on file   Number of children: Not on file   Years of education: Not on file   Highest education level: Not on file  Occupational History   Not on file  Tobacco Use   Smoking status: Some Days    Packs/day: 0.25    Years: 23.00    Total pack years: 5.75    Types: Cigarettes, Cigars   Smokeless tobacco:  Never   Tobacco comments:    smokes 1 cigar per day 04/30/2016  Vaping Use   Vaping Use: Never used  Substance and Sexual Activity   Alcohol use: Not Currently    Alcohol/week: 1.0 standard drink of alcohol    Types: 1 Shots of liquor per week    Comment: has not had in 2 years   Drug use: No   Sexual activity: Not on file  Other Topics Concern   Not on file  Social History Narrative   Not on file   Social Determinants of Health   Financial Resource Strain: Not on file  Food Insecurity: Not on file  Transportation Needs: Not on file  Physical Activity: Not on file  Stress: Not on file  Social Connections: Not on file  Intimate Partner Violence: Not on file    FAMILY HISTORY: Family History  Problem Relation Age of Onset   Hypertension Mother    Breast cancer Mother    Lung cancer Father    Breast cancer Sister     ALLERGIES:  has No Known Allergies.  MEDICATIONS:  Current Outpatient Medications  Medication Sig Dispense Refill   aspirin EC 81 MG tablet Take 81 mg by mouth daily. Swallow whole.     azelastine  (ASTELIN) 0.1 % nasal spray Place 1-2 sprays into both nostrils as needed.     carbidopa-levodopa (SINEMET CR) 50-200 MG tablet Take 1 tablet by mouth at bedtime.     carbidopa-levodopa (SINEMET IR) 25-100 MG tablet Take 2 tablets by mouth 3 (three) times daily.      cyanocobalamin 1000 MCG tablet Take 1 tablet (1,000 mcg total) by mouth daily. 30 tablet 0   docusate sodium (COLACE) 100 MG capsule Take 100 mg by mouth daily.     entacapone (COMTAN) 200 MG tablet Take 200 mg by mouth 3 (three) times daily.     fluticasone-salmeterol (ADVAIR) 250-50 MCG/ACT AEPB Inhale 1 puff into the lungs as needed.     Fluticasone-Umeclidin-Vilant (TRELEGY ELLIPTA) 100-62.5-25 MCG/INH AEPB Inhale 1 puff into the lungs daily as needed.     levETIRAcetam (KEPPRA) 250 MG tablet Take 1 tablet (250 mg total) by mouth 2 (two) times daily. 60 tablet 1   levETIRAcetam (KEPPRA) 500 MG tablet Take 500 mg by mouth 2 (two) times daily. Take along with 235m for total of 7577m    memantine (NAMENDA) 5 MG tablet Take 5 mg by mouth daily.     midodrine (PROAMATINE) 5 MG tablet Take 5 mg by mouth in the morning, at noon, and at bedtime.     MYRBETRIQ 50 MG TB24 tablet Take 50 mg by mouth at bedtime.     omeprazole (PRILOSEC) 20 MG capsule Take 1 capsule (20 mg total) by mouth daily. 30 capsule 2   sertraline (ZOLOFT) 50 MG tablet Take 50 mg by mouth daily.     tamsulosin (FLOMAX) 0.4 MG CAPS capsule Take 1 capsule (0.4 mg total) by mouth daily after supper. (Patient taking differently: Take 0.4 mg by mouth 2 (two) times daily.) 10 capsule 0   tiZANidine (ZANAFLEX) 4 MG tablet Take 4 mg by mouth daily as needed for muscle spasms.      NAYZILAM 5 MG/0.1ML SOLN Place into both nostrils. (Patient not taking: Reported on 11/06/2021)     predniSONE (DELTASONE) 10 MG tablet Take 3 tablets (30 mg total) by mouth daily with breakfast. 90 tablet 1   No current facility-administered medications for this visit.  PHYSICAL  EXAMINATION: ECOG PERFORMANCE STATUS: 2 - Symptomatic, <50% confined to bed Vitals:   12/16/21 1005  BP: (!) 78/42  Pulse: 86  Resp: 20  Temp: 97.8 F (36.6 C)  SpO2: 99%   Filed Weights   12/16/21 1005  Weight: 159 lb 14.4 oz (72.5 kg)    Physical Exam Constitutional:      General: He is not in acute distress.    Comments: Sits in a wheelchair.   HENT:     Head: Normocephalic and atraumatic.  Eyes:     General: No scleral icterus. Cardiovascular:     Rate and Rhythm: Normal rate and regular rhythm.     Heart sounds: Normal heart sounds.  Pulmonary:     Effort: Pulmonary effort is normal. No respiratory distress.     Breath sounds: No wheezing.  Abdominal:     General: Bowel sounds are normal. There is no distension.     Palpations: Abdomen is soft.  Musculoskeletal:        General: No deformity. Normal range of motion.     Cervical back: Normal range of motion and neck supple.     Comments: cogwheel rigidity  Skin:    General: Skin is warm and dry.     Findings: No erythema or rash.     Comments: Left posterior flank bruising  Neurological:     Mental Status: He is alert and oriented to person, place, and time.  Psychiatric:        Mood and Affect: Mood normal.     LABORATORY DATA:  I have reviewed the data as listed     Latest Ref Rng & Units 12/16/2021    9:41 AM 11/06/2021   11:38 AM 10/16/2021    9:04 AM  CBC  WBC 4.0 - 10.5 K/uL 17.0  22.4  18.4   Hemoglobin 13.0 - 17.0 g/dL 14.5  14.8  14.7   Hematocrit 39.0 - 52.0 % 44.7  44.8  45.1   Platelets 150 - 400 K/uL 163  177  182       Latest Ref Rng & Units 12/16/2021    9:41 AM 11/06/2021   11:38 AM 10/16/2021    9:04 AM  CMP  Glucose 70 - 99 mg/dL 115  113  84   BUN 8 - 23 mg/dL 34  37  39   Creatinine 0.61 - 1.24 mg/dL 1.72  1.86  1.54   Sodium 135 - 145 mmol/L 139  139  136   Potassium 3.5 - 5.1 mmol/L 4.3  4.4  4.3   Chloride 98 - 111 mmol/L 108  105  105   CO2 22 - 32 mmol/L 23  24  23     Calcium 8.9 - 10.3 mg/dL 8.7  8.8  8.5   Total Protein 6.5 - 8.1 g/dL 6.1  6.5  6.4   Total Bilirubin 0.3 - 1.2 mg/dL 0.7  0.5  0.6   Alkaline Phos 38 - 126 U/L 54  68  60   AST 15 - 41 U/L 20  16  19    ALT 0 - 44 U/L 6  7  5         RADIOGRAPHIC STUDIES: I have personally reviewed the radiological images as listed and agreed with the findings in the report. CT CHEST WO CONTRAST  Result Date: 12/04/2021 CLINICAL DATA:  Cough.  Lung nodule.  History of asthma. EXAM: CT CHEST WITHOUT CONTRAST TECHNIQUE: Multidetector CT imaging of the chest was  performed following the standard protocol without IV contrast. RADIATION DOSE REDUCTION: This exam was performed according to the departmental dose-optimization program which includes automated exposure control, adjustment of the mA and/or kV according to patient size and/or use of iterative reconstruction technique. COMPARISON:  Chest CT dated 04/15/2021. FINDINGS: Evaluation of this exam is limited in the absence of intravenous contrast. Cardiovascular: There is no cardiomegaly or pericardial effusion. Three-vessel coronary vascular calcification. The thoracic aorta and central pulmonary arteries are grossly unremarkable on this noncontrast CT. Mediastinum/Nodes: No hilar or mediastinal adenopathy. The esophagus and the thyroid gland are grossly unremarkable. No mediastinal fluid collection. Lungs/Pleura: There is a 2 cm subpleural nodule in the superior segment of the right lower lobe, new since the prior CT of 04/15/2021. Although this may represent an inflammatory/infectious etiology underlying neoplasm is not excluded clinical correlation and close follow-up after treatment recommended to resolution. Faint bibasilar interstitial coarsening and nodularity, likely chronic. A 3 mm nodule in the lingula (92/4 appears similar to prior CT and benign-appearing. The right middle lobe nodule is not identified on today's exam. There is no lobar consolidation. No  pleural effusion pneumothorax. The central airways are patent. Upper Abdomen: Colonic diverticulosis. Musculoskeletal: No chest wall mass or suspicious bone lesions identified. IMPRESSION: 1. A 2 cm subpleural nodule in the superior segment of the right lower lobe, new since the prior CT of 04/15/2021. Although this may represent an inflammatory/infectious etiology underlying neoplasm is not excluded. Clinical correlation and close follow-up after treatment recommended to document resolution and exclude malignancy. 2. Faint bibasilar interstitial coarsening and nodularity, likely chronic. Electronically Signed   By: Anner Crete M.D.   On: 12/04/2021 21:51  CT CHEST WO CONTRAST  Result Date: 12/04/2021 CLINICAL DATA:  Cough.  Lung nodule.  History of asthma. EXAM: CT CHEST WITHOUT CONTRAST TECHNIQUE: Multidetector CT imaging of the chest was performed following the standard protocol without IV contrast. RADIATION DOSE REDUCTION: This exam was performed according to the departmental dose-optimization program which includes automated exposure control, adjustment of the mA and/or kV according to patient size and/or use of iterative reconstruction technique. COMPARISON:  Chest CT dated 04/15/2021. FINDINGS: Evaluation of this exam is limited in the absence of intravenous contrast. Cardiovascular: There is no cardiomegaly or pericardial effusion. Three-vessel coronary vascular calcification. The thoracic aorta and central pulmonary arteries are grossly unremarkable on this noncontrast CT. Mediastinum/Nodes: No hilar or mediastinal adenopathy. The esophagus and the thyroid gland are grossly unremarkable. No mediastinal fluid collection. Lungs/Pleura: There is a 2 cm subpleural nodule in the superior segment of the right lower lobe, new since the prior CT of 04/15/2021. Although this may represent an inflammatory/infectious etiology underlying neoplasm is not excluded clinical correlation and close follow-up after  treatment recommended to resolution. Faint bibasilar interstitial coarsening and nodularity, likely chronic. A 3 mm nodule in the lingula (92/4 appears similar to prior CT and benign-appearing. The right middle lobe nodule is not identified on today's exam. There is no lobar consolidation. No pleural effusion pneumothorax. The central airways are patent. Upper Abdomen: Colonic diverticulosis. Musculoskeletal: No chest wall mass or suspicious bone lesions identified. IMPRESSION: 1. A 2 cm subpleural nodule in the superior segment of the right lower lobe, new since the prior CT of 04/15/2021. Although this may represent an inflammatory/infectious etiology underlying neoplasm is not excluded. Clinical correlation and close follow-up after treatment recommended to document resolution and exclude malignancy. 2. Faint bibasilar interstitial coarsening and nodularity, likely chronic. Electronically Signed   By: Milas Hock  Radparvar M.D.   On: 12/04/2021 21:51      cc Adin Hector, MD

## 2021-12-18 ENCOUNTER — Other Ambulatory Visit: Payer: Medicare PPO

## 2021-12-31 ENCOUNTER — Other Ambulatory Visit: Payer: Self-pay | Admitting: Internal Medicine

## 2021-12-31 DIAGNOSIS — R911 Solitary pulmonary nodule: Secondary | ICD-10-CM

## 2022-01-05 ENCOUNTER — Encounter: Payer: Self-pay | Admitting: Emergency Medicine

## 2022-01-05 ENCOUNTER — Other Ambulatory Visit: Payer: Self-pay

## 2022-01-05 ENCOUNTER — Emergency Department: Payer: Medicare PPO

## 2022-01-05 ENCOUNTER — Observation Stay: Payer: Medicare PPO

## 2022-01-05 ENCOUNTER — Observation Stay
Admission: EM | Admit: 2022-01-05 | Discharge: 2022-01-07 | Disposition: A | Discharge status: Home / Self Care | Payer: Medicare PPO | Attending: Internal Medicine | Admitting: Internal Medicine

## 2022-01-05 DIAGNOSIS — R4182 Altered mental status, unspecified: Secondary | ICD-10-CM | POA: Diagnosis present

## 2022-01-05 DIAGNOSIS — F17210 Nicotine dependence, cigarettes, uncomplicated: Secondary | ICD-10-CM | POA: Diagnosis not present

## 2022-01-05 DIAGNOSIS — F0280 Dementia in other diseases classified elsewhere without behavioral disturbance: Secondary | ICD-10-CM | POA: Diagnosis not present

## 2022-01-05 DIAGNOSIS — R569 Unspecified convulsions: Secondary | ICD-10-CM | POA: Diagnosis not present

## 2022-01-05 DIAGNOSIS — R531 Weakness: Secondary | ICD-10-CM

## 2022-01-05 DIAGNOSIS — Z7189 Other specified counseling: Secondary | ICD-10-CM | POA: Diagnosis not present

## 2022-01-05 DIAGNOSIS — J45909 Unspecified asthma, uncomplicated: Secondary | ICD-10-CM | POA: Insufficient documentation

## 2022-01-05 DIAGNOSIS — I081 Rheumatic disorders of both mitral and tricuspid valves: Secondary | ICD-10-CM | POA: Diagnosis not present

## 2022-01-05 DIAGNOSIS — Z515 Encounter for palliative care: Secondary | ICD-10-CM

## 2022-01-05 DIAGNOSIS — D72119 Hypereosinophilic syndrome (hes), unspecified: Secondary | ICD-10-CM | POA: Diagnosis present

## 2022-01-05 DIAGNOSIS — R296 Repeated falls: Secondary | ICD-10-CM | POA: Diagnosis not present

## 2022-01-05 DIAGNOSIS — I700 Atherosclerosis of aorta: Secondary | ICD-10-CM | POA: Diagnosis not present

## 2022-01-05 DIAGNOSIS — I6389 Other cerebral infarction: Secondary | ICD-10-CM | POA: Diagnosis not present

## 2022-01-05 DIAGNOSIS — Z8673 Personal history of transient ischemic attack (TIA), and cerebral infarction without residual deficits: Secondary | ICD-10-CM | POA: Insufficient documentation

## 2022-01-05 DIAGNOSIS — R441 Visual hallucinations: Secondary | ICD-10-CM

## 2022-01-05 DIAGNOSIS — J449 Chronic obstructive pulmonary disease, unspecified: Secondary | ICD-10-CM | POA: Diagnosis present

## 2022-01-05 DIAGNOSIS — I951 Orthostatic hypotension: Secondary | ICD-10-CM | POA: Diagnosis present

## 2022-01-05 DIAGNOSIS — G43909 Migraine, unspecified, not intractable, without status migrainosus: Secondary | ICD-10-CM | POA: Diagnosis not present

## 2022-01-05 DIAGNOSIS — G20A1 Parkinson's disease without dyskinesia, without mention of fluctuations: Secondary | ICD-10-CM | POA: Diagnosis not present

## 2022-01-05 DIAGNOSIS — Z7982 Long term (current) use of aspirin: Secondary | ICD-10-CM | POA: Diagnosis not present

## 2022-01-05 DIAGNOSIS — N1832 Chronic kidney disease, stage 3b: Secondary | ICD-10-CM | POA: Diagnosis not present

## 2022-01-05 DIAGNOSIS — I639 Cerebral infarction, unspecified: Secondary | ICD-10-CM

## 2022-01-05 DIAGNOSIS — U071 COVID-19: Secondary | ICD-10-CM | POA: Diagnosis present

## 2022-01-05 DIAGNOSIS — F32A Depression, unspecified: Secondary | ICD-10-CM | POA: Diagnosis present

## 2022-01-05 DIAGNOSIS — R410 Disorientation, unspecified: Secondary | ICD-10-CM | POA: Diagnosis not present

## 2022-01-05 DIAGNOSIS — Z79899 Other long term (current) drug therapy: Secondary | ICD-10-CM | POA: Insufficient documentation

## 2022-01-05 DIAGNOSIS — G9340 Encephalopathy, unspecified: Secondary | ICD-10-CM | POA: Diagnosis present

## 2022-01-05 DIAGNOSIS — R4189 Other symptoms and signs involving cognitive functions and awareness: Secondary | ICD-10-CM | POA: Diagnosis present

## 2022-01-05 DIAGNOSIS — Z20822 Contact with and (suspected) exposure to covid-19: Secondary | ICD-10-CM | POA: Diagnosis not present

## 2022-01-05 DIAGNOSIS — G319 Degenerative disease of nervous system, unspecified: Secondary | ICD-10-CM | POA: Diagnosis not present

## 2022-01-05 DIAGNOSIS — K219 Gastro-esophageal reflux disease without esophagitis: Secondary | ICD-10-CM | POA: Diagnosis present

## 2022-01-05 DIAGNOSIS — N1831 Chronic kidney disease, stage 3a: Secondary | ICD-10-CM | POA: Diagnosis present

## 2022-01-05 DIAGNOSIS — I712 Thoracic aortic aneurysm, without rupture, unspecified: Secondary | ICD-10-CM | POA: Diagnosis not present

## 2022-01-05 LAB — CBC WITH DIFFERENTIAL/PLATELET
Abs Immature Granulocytes: 0.93 10*3/uL — ABNORMAL HIGH (ref 0.00–0.07)
Basophils Absolute: 0.1 10*3/uL (ref 0.0–0.1)
Basophils Relative: 1 %
Eosinophils Absolute: 0 10*3/uL (ref 0.0–0.5)
Eosinophils Relative: 0 %
HCT: 39.5 % (ref 39.0–52.0)
Hemoglobin: 13 g/dL (ref 13.0–17.0)
Immature Granulocytes: 8 %
Lymphocytes Relative: 13 %
Lymphs Abs: 1.7 10*3/uL (ref 0.7–4.0)
MCH: 29.3 pg (ref 26.0–34.0)
MCHC: 32.9 g/dL (ref 30.0–36.0)
MCV: 89 fL (ref 80.0–100.0)
Monocytes Absolute: 0.7 10*3/uL (ref 0.1–1.0)
Monocytes Relative: 6 %
Neutro Abs: 9 10*3/uL — ABNORMAL HIGH (ref 1.7–7.7)
Neutrophils Relative %: 72 %
Platelets: 172 10*3/uL (ref 150–400)
RBC: 4.44 MIL/uL (ref 4.22–5.81)
RDW: 14.6 % (ref 11.5–15.5)
Smear Review: NORMAL
WBC: 12.4 10*3/uL — ABNORMAL HIGH (ref 4.0–10.5)
nRBC: 0 % (ref 0.0–0.2)

## 2022-01-05 LAB — COMPREHENSIVE METABOLIC PANEL
ALT: 7 U/L (ref 0–44)
AST: 18 U/L (ref 15–41)
Albumin: 3.1 g/dL — ABNORMAL LOW (ref 3.5–5.0)
Alkaline Phosphatase: 53 U/L (ref 38–126)
Anion gap: 7 (ref 5–15)
BUN: 32 mg/dL — ABNORMAL HIGH (ref 8–23)
CO2: 22 mmol/L (ref 22–32)
Calcium: 8.4 mg/dL — ABNORMAL LOW (ref 8.9–10.3)
Chloride: 108 mmol/L (ref 98–111)
Creatinine, Ser: 1.78 mg/dL — ABNORMAL HIGH (ref 0.61–1.24)
GFR, Estimated: 41 mL/min — ABNORMAL LOW (ref >60)
Glucose, Bld: 88 mg/dL (ref 70–99)
Potassium: 4 mmol/L (ref 3.5–5.1)
Sodium: 137 mmol/L (ref 135–145)
Total Bilirubin: 1 mg/dL (ref 0.3–1.2)
Total Protein: 6.4 g/dL — ABNORMAL LOW (ref 6.5–8.1)

## 2022-01-05 LAB — URINALYSIS, ROUTINE W REFLEX MICROSCOPIC
Bacteria, UA: NONE SEEN
Bilirubin Urine: NEGATIVE
Glucose, UA: NEGATIVE mg/dL
Hgb urine dipstick: NEGATIVE
Ketones, ur: NEGATIVE mg/dL
Leukocytes,Ua: NEGATIVE
Nitrite: NEGATIVE
Protein, ur: 30 mg/dL — AB
Specific Gravity, Urine: 1.013 (ref 1.005–1.030)
Squamous Epithelial / HPF: NONE SEEN (ref 0–5)
pH: 6 (ref 5.0–8.0)

## 2022-01-05 LAB — TROPONIN I (HIGH SENSITIVITY)
Troponin I (High Sensitivity): 40 ng/L — ABNORMAL HIGH (ref <18)
Troponin I (High Sensitivity): 35 ng/L — ABNORMAL HIGH (ref <18)

## 2022-01-05 LAB — HIV ANTIBODY (ROUTINE TESTING W REFLEX): HIV Screen 4th Generation wRfx: NONREACTIVE

## 2022-01-05 LAB — LACTIC ACID, PLASMA: Lactic Acid, Venous: 1.7 mmol/L (ref 0.5–1.9)

## 2022-01-05 LAB — PROCALCITONIN: Procalcitonin: <0.1 ng/mL

## 2022-01-05 LAB — SARS CORONAVIRUS 2 BY RT PCR: SARS Coronavirus 2 by RT PCR: POSITIVE — AB

## 2022-01-05 MED ORDER — SERTRALINE HCL 50 MG PO TABS
50.0000 mg | ORAL_TABLET | Freq: Every day | ORAL | Status: DC
  Administered 2022-01-05 – 2022-01-07 (×3): 50 mg via ORAL
  Filled 2022-01-05 (×3): qty 1

## 2022-01-05 MED ORDER — ASPIRIN 81 MG PO TBEC
81.0000 mg | DELAYED_RELEASE_TABLET | Freq: Every day | ORAL | Status: DC
  Administered 2022-01-05 – 2022-01-07 (×3): 81 mg via ORAL
  Filled 2022-01-05 (×3): qty 1

## 2022-01-05 MED ORDER — ONDANSETRON HCL 4 MG PO TABS: 4.0000 mg | ORAL_TABLET | Freq: Four times a day (QID) | ORAL | Status: DC | PRN

## 2022-01-05 MED ORDER — LEVETIRACETAM 500 MG PO TABS
500.0000 mg | ORAL_TABLET | Freq: Two times a day (BID) | ORAL | Status: DC
  Administered 2022-01-05 – 2022-01-07 (×5): 500 mg via ORAL
  Filled 2022-01-05 (×6): qty 1

## 2022-01-05 MED ORDER — LEVETIRACETAM 250 MG PO TABS
250.0000 mg | ORAL_TABLET | Freq: Two times a day (BID) | ORAL | Status: DC
  Administered 2022-01-05 – 2022-01-07 (×5): 250 mg via ORAL
  Filled 2022-01-05 (×6): qty 1

## 2022-01-05 MED ORDER — PANTOPRAZOLE SODIUM 40 MG PO TBEC
40.0000 mg | DELAYED_RELEASE_TABLET | Freq: Every day | ORAL | Status: DC
  Administered 2022-01-05 – 2022-01-07 (×3): 40 mg via ORAL
  Filled 2022-01-05 (×3): qty 1

## 2022-01-05 MED ORDER — QUETIAPINE FUMARATE 25 MG PO TABS: 25.0000 mg | ORAL_TABLET | Freq: Once | ORAL | Status: DC

## 2022-01-05 MED ORDER — MIDODRINE HCL 5 MG PO TABS
10.0000 mg | ORAL_TABLET | Freq: Three times a day (TID) | ORAL | Status: DC
  Administered 2022-01-05 – 2022-01-07 (×7): 10 mg via ORAL
  Filled 2022-01-05 (×8): qty 2

## 2022-01-05 MED ORDER — MIRABEGRON ER 50 MG PO TB24
50.0000 mg | ORAL_TABLET | Freq: Every day | ORAL | Status: DC
  Administered 2022-01-05 – 2022-01-06 (×2): 50 mg via ORAL
  Filled 2022-01-05 (×4): qty 1

## 2022-01-05 MED ORDER — ACETAMINOPHEN 650 MG RE SUPP: 650.0000 mg | Freq: Four times a day (QID) | RECTAL | Status: DC | PRN

## 2022-01-05 MED ORDER — PRAVASTATIN SODIUM 20 MG PO TABS
10.0000 mg | ORAL_TABLET | Freq: Every day | ORAL | Status: DC
  Administered 2022-01-05 – 2022-01-06 (×2): 10 mg via ORAL
  Filled 2022-01-05 (×2): qty 1

## 2022-01-05 MED ORDER — MEMANTINE HCL 5 MG PO TABS
5.0000 mg | ORAL_TABLET | Freq: Every day | ORAL | Status: DC
  Administered 2022-01-05 – 2022-01-07 (×3): 5 mg via ORAL
  Filled 2022-01-05 (×3): qty 1

## 2022-01-05 MED ORDER — CARBIDOPA-LEVODOPA 25-100 MG PO TABS
1.0000 | ORAL_TABLET | Freq: Four times a day (QID) | ORAL | Status: DC
  Administered 2022-01-05 – 2022-01-07 (×8): 1 via ORAL
  Filled 2022-01-05 (×8): qty 1

## 2022-01-05 MED ORDER — DOCUSATE SODIUM 100 MG PO CAPS
100.0000 mg | ORAL_CAPSULE | Freq: Every day | ORAL | Status: DC
  Administered 2022-01-05 – 2022-01-07 (×3): 100 mg via ORAL
  Filled 2022-01-05 (×3): qty 1

## 2022-01-05 MED ORDER — ZINC SULFATE 220 (50 ZN) MG PO CAPS
220.0000 mg | ORAL_CAPSULE | Freq: Every day | ORAL | Status: DC
  Administered 2022-01-05 – 2022-01-07 (×3): 220 mg via ORAL
  Filled 2022-01-05 (×3): qty 1

## 2022-01-05 MED ORDER — SODIUM CHLORIDE 0.9% FLUSH: 3.0000 mL | INTRAVENOUS | Status: DC | PRN

## 2022-01-05 MED ORDER — UMECLIDINIUM BROMIDE 62.5 MCG/ACT IN AEPB
1.0000 | INHALATION_SPRAY | Freq: Every day | RESPIRATORY_TRACT | Status: DC
  Administered 2022-01-05 – 2022-01-07 (×3): 1 via RESPIRATORY_TRACT
  Filled 2022-01-05: qty 7

## 2022-01-05 MED ORDER — AMANTADINE HCL 100 MG PO CAPS
100.0000 mg | ORAL_CAPSULE | Freq: Three times a day (TID) | ORAL | Status: DC
  Administered 2022-01-05 – 2022-01-07 (×8): 100 mg via ORAL
  Filled 2022-01-05 (×9): qty 1

## 2022-01-05 MED ORDER — VITAMIN C 500 MG PO TABS
500.0000 mg | ORAL_TABLET | Freq: Every day | ORAL | Status: DC
  Administered 2022-01-05 – 2022-01-07 (×3): 500 mg via ORAL
  Filled 2022-01-05 (×3): qty 1

## 2022-01-05 MED ORDER — PREDNISONE 20 MG PO TABS
30.0000 mg | ORAL_TABLET | Freq: Every day | ORAL | Status: DC
  Administered 2022-01-05 – 2022-01-07 (×3): 30 mg via ORAL
  Filled 2022-01-05 (×3): qty 1

## 2022-01-05 MED ORDER — FLUTICASONE FUROATE-VILANTEROL 100-25 MCG/ACT IN AEPB
1.0000 | INHALATION_SPRAY | Freq: Every day | RESPIRATORY_TRACT | Status: DC
  Administered 2022-01-05 – 2022-01-07 (×3): 1 via RESPIRATORY_TRACT
  Filled 2022-01-05: qty 28

## 2022-01-05 MED ORDER — QUETIAPINE FUMARATE 25 MG PO TABS
25.0000 mg | ORAL_TABLET | Freq: Two times a day (BID) | ORAL | Status: DC
  Administered 2022-01-05 – 2022-01-06 (×3): 25 mg via ORAL
  Filled 2022-01-05 (×3): qty 1

## 2022-01-05 MED ORDER — TIZANIDINE HCL 4 MG PO TABS: 4.0000 mg | ORAL_TABLET | Freq: Every day | ORAL | Status: DC | PRN

## 2022-01-05 MED ORDER — ENTACAPONE 200 MG PO TABS
200.0000 mg | ORAL_TABLET | Freq: Three times a day (TID) | ORAL | Status: DC
  Administered 2022-01-05 – 2022-01-07 (×8): 200 mg via ORAL
  Filled 2022-01-05 (×9): qty 1

## 2022-01-05 MED ORDER — ALBUTEROL SULFATE HFA 108 (90 BASE) MCG/ACT IN AERS: 1.0000 | INHALATION_SPRAY | Freq: Four times a day (QID) | RESPIRATORY_TRACT | Status: DC | PRN

## 2022-01-05 MED ORDER — ONDANSETRON HCL 4 MG/2ML IJ SOLN: 4.0000 mg | Freq: Four times a day (QID) | INTRAMUSCULAR | Status: DC | PRN

## 2022-01-05 MED ORDER — ENOXAPARIN SODIUM 40 MG/0.4ML IJ SOSY
40.0000 mg | PREFILLED_SYRINGE | INTRAMUSCULAR | Status: DC
  Administered 2022-01-05 – 2022-01-07 (×3): 40 mg via SUBCUTANEOUS
  Filled 2022-01-05 (×3): qty 0.4

## 2022-01-05 MED ORDER — CARBIDOPA-LEVODOPA ER 50-200 MG PO TBCR
1.0000 | EXTENDED_RELEASE_TABLET | Freq: Every day | ORAL | Status: DC
  Administered 2022-01-05 – 2022-01-06 (×2): 1 via ORAL
  Filled 2022-01-05 (×3): qty 1

## 2022-01-05 MED ORDER — CARBIDOPA-LEVODOPA 25-100 MG PO TABS
2.0000 | ORAL_TABLET | Freq: Three times a day (TID) | ORAL | Status: DC
  Administered 2022-01-05: 1 via ORAL
  Filled 2022-01-05: qty 2

## 2022-01-05 MED ORDER — SODIUM CHLORIDE 0.9% FLUSH
3.0000 mL | Freq: Two times a day (BID) | INTRAVENOUS | Status: DC
  Administered 2022-01-05 – 2022-01-07 (×4): 3 mL via INTRAVENOUS

## 2022-01-05 MED ORDER — GUAIFENESIN 100 MG/5ML PO LIQD: 5.0000 mL | ORAL | Status: DC | PRN

## 2022-01-05 MED ORDER — SODIUM CHLORIDE 0.9 % IV SOLN: 250.0000 mL | INTRAVENOUS | Status: DC | PRN

## 2022-01-05 MED ORDER — ACETAMINOPHEN 325 MG PO TABS: 650.0000 mg | ORAL_TABLET | Freq: Four times a day (QID) | ORAL | Status: DC | PRN

## 2022-01-05 MED ORDER — LACTATED RINGERS IV BOLUS
1000.0000 mL | Freq: Once | INTRAVENOUS | Status: AC
  Administered 2022-01-05: 1000 mL via INTRAVENOUS

## 2022-01-05 NOTE — ED Provider Notes (Signed)
[facility] Provider Note    Event Date/Time   First MD Initiated Contact with Patient 01/05/22 0405     (approximate)   History   Altered Mental Status   HPI  Cory [name] is a 66 y.o. male past medical history of seizure disorder, Parkinson's disease, coronary artery disease, CKD who presents with altered mental status.  Per EMS patient was confused and hallucinating today.  Apparently he tested positive for COVID last week and is taking an antiviral which he says starts with an L.  Patient tells me he has been feeling short of breath since being diagnosed with COVID and has a cough productive of yellow sputum.  He has had a headache decreased appetite and not eating very much.  Has had multiple falls over the last several days.  [place] about 4-5 times per day.  Tells me when he stands up he just feels like he has no balance and loses all the strength in his legs.  He denies ever hitting his head.  Denies focal numbness tingling or weakness.  When asked about hallucinations he tells me that since being on the antiviral he has been dreaming frequently and wakes up remembering his dreams and is talking aloud with dreams but denies any auditory or visual hallucinations during the day.  I spoke with patient's wife who is at bedside.  She tells me that this is day 9 of him having COVID.  He is taking the antiviral.  He has been having progressive weakness to the point that now he is unable to ambulate even with his walker and she has to push him around.  He has had multiple falls.  He is tolerating p.o. and eating.  Has been more confused.  Initially was just having vivid dreams yesterday was hallucinating, saw a child when she was bathing him after had an episode of diarrhea.  This morning he woke her up to use the bathroom when she was helping him to the bathroom he had a generalized tonic-clonic seizure that lasted for about 45 seconds.  She tells me he has not had a  seizure in about 6 months.  Takes Keppra and is compliant.  Past Medical History:  Diagnosis Date   Abnormal ankle brachial index    Aortic atherosclerosis (HCC)    Arthritis    hips and back   Asthma    as a baby-    Bronchiectasis without complication (HCC)    CKD (chronic kidney disease) stage 53, GFR 30-59 ml/min (HCC)    Coronary artery calcification seen on CT scan    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    ED (erectile dysfunction)    Eosinophilia    GERD (gastroesophageal reflux disease)    rare -    History of BPH    History of genital warts    History of solitary pulmonary nodule    Left and right lung nodules on CT in ER 1/20; stable by CT 2/21   Hypertriglyceridemia    Microscopic hematuria    Other chronic pain    Parkinson's disease    Pneumonia    age 52   Post-traumatic osteoarthritis of left knee    Rash    legs- on going   Seizure-like activity [facility])    Umbilical hernia     Patient Active Problem List   Diagnosis Date Noted   Orthostatic hypotension 11/06/2021   GERD (gastroesophageal reflux disease) 08/14/2021   Microscopic hematuria 08/14/2021  Seizure ([place]) 08/08/2021   Raynaud's phenomenon without gangrene 08/08/2021   Elevated serum immunoglobulin free light chain level 05/26/2021   Liver lesion 05/26/2021   Labile blood pressure 05/14/2021   Hypereosinophilic syndrome (hes), 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/27/2021   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[place]) 04/21/2021   Thoracic aortic aneurysm ([place]) 04/21/2021   Seizure-like activity ([place]) 08/05/2020   Focal seizure ([place]) 05/24/2020   Parkinson's disease    History of migraine headaches    Migraine syndrome    BPH (benign prostatic hyperplasia)    Depression    Abnormal ankle brachial index 03/31/2020   Aortic atherosclerosis ([place]) 10/18/2019   Bronchiectasis without complication ([place]) 10/03/1599   Coronary artery calcification seen on CT scan 05/31/2019   Post-traumatic osteoarthritis of left  knee 05/28/2019   Lung nodule seen on imaging study 04/16/2018   CKD (chronic kidney disease) stage 3, GFR 30-59 ml/min ([place]) 02/10/2018   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 04/27/2017   Other chronic pain 01/26/2017   Degenerative spondylolisthesis 05/07/2016   Parkinson disease 01/14/2016   ED (erectile dysfunction) 12/24/2011   Hypertriglyceridemia 12/24/2011   Testosterone deficiency 09/32/3557   Umbilical hernia 32/20/2542     Physical Exam  Triage Vital Signs: ED Triage Vitals [01/05/22 0417]  Enc Vitals Group     BP      Pulse Rate 100     Resp 19     Temp      Temp src      SpO2 95 %     Weight      Height      Head Circumference      Peak Flow      Pain Score      Pain Loc      Pain Edu?      Excl. in [place]?     Most recent vital signs: Vitals:   01/05/22 0421 01/05/22 0531  BP:  (!) 147/81  Pulse:    Resp:    Temp: (!) 97.5 F (36.4 C)   SpO2:       General: Awake, patient is diaphoretic and tachypneic CV:  Good peripheral perfusion.  No peripheral edema Resp:  Tachypneic with clear lungs Abd:  No distention.  Abdomen soft nontender there is ecchymosis on the right lower quadrant and right flank that is nontender Neuro:             Awake, Alert, Oriented x2-thinks that it is year 2013 Other:  PERRLA, EOMI, face is symmetric, equal strength  FNF intact, Visual fields intact BL upper and lower extremities  Dry mucous membranes   ED Results / Procedures / Treatments  Labs (all labs ordered are listed, but only abnormal results are displayed) Labs Reviewed  CBC WITH DIFFERENTIAL/PLATELET - Abnormal; Notable for the following components:      Result Value   WBC 12.4 (*)    Neutro Abs 9.0 (*)    Abs Immature Granulocytes 0.93 (*)    All other components within normal limits  COMPREHENSIVE METABOLIC PANEL - Abnormal; Notable for the following components:   BUN 32 (*)    Creatinine, Ser 1.78 (*)    Calcium 8.4 (*)    Total Protein 6.4 (*)     Albumin 3.1 (*)    GFR, Estimated 41 (*)    All other components within normal limits  TROPONIN I (HIGH SENSITIVITY) - Abnormal; Notable for the following components:   Troponin I (High Sensitivity) 40 (*)    All other components  within normal limits  CULTURE, BLOOD (ROUTINE X 2)  CULTURE, BLOOD (ROUTINE X 2)  SARS CORONAVIRUS 2 BY RT PCR  PROCALCITONIN  LACTIC ACID, PLASMA  URINALYSIS, ROUTINE W REFLEX MICROSCOPIC  LACTIC ACID, PLASMA  CBG MONITORING, ED  TROPONIN I (HIGH SENSITIVITY)     EKG  EKG interpretation performed by myself: NSR, nml axis, nml intervals, no acute ischemic changes    RADIOLOGY I reviewed and interpreted the CT scan of the brain which does not show any acute intracranial process    PROCEDURES:  Critical Care performed: No  .1-3 Lead EKG Interpretation  Performed by: Rada Hay, MD Authorized by: Rada Hay, MD     Interpretation: abnormal     ECG rate assessment: tachycardic     Rhythm: sinus rhythm     Ectopy: none     Conduction: normal     The patient is on the cardiac monitor to evaluate for evidence of arrhythmia and/or significant heart rate changes.   MEDICATIONS ORDERED IN ED: Medications  lactated ringers bolus 1,000 mL (1,000 mLs Intravenous New Bag/Given 01/05/22 0416)     IMPRESSION / MDM / ASSESSMENT AND PLAN / ED COURSE  I reviewed the triage vital signs and the nursing notes.                              Patient's presentation is most consistent with acute presentation with potential threat to life or bodily function.  Differential diagnosis includes, but is not limited to, medication side effect, acidosis, pulmonary embolism, bacterial pneumonia, COVID-pneumonia, metabolic encephalopathy, renal failure, hyponatremia/hypernatremia  Patient is a 66 year old male with Parkinson's disease who presents with altered mental status.  Apparently he was diagnosed with COVID last week and is taking an antiviral  starting with an "L" which I suspect is lagevrio.  Patient does endorse shortness of breath headache and frequent falls due to unsteadiness and decreased strength in his extremities.  On arrival he is tachycardic satting well he appears diaphoretic and is tachypneic.  Lungs are clear his exam is nonfocal he is just disoriented to time thinks it is 2013 but the rest of his neurologic exam is nonfocal.  Differential is broad and is as above.  We will check labs lactate blood cultures chest x-ray CT head.  We will give a liter of fluid as he looks volume depleted to me.  Patient's labs are overall reassuring.  Does have a Lucas of 12.4 though patient is on chronic steroids.  CMP shows renal function and is near baseline.  BUN mildly elevated at 32.  Troponin is 40.  EKG has no ischemic changes and patient has no chest pain.  Procalcitonin is negative.  UA still pending.  Chest x-ray is clear.  CT head showing age-indeterminate infarcts in the cerebellum and occipital lobe.  Patient has nonfocal neurologic exam low suspicion that these are acute.  Had a long discussion with both him and his wife.  Overall given his decline in strength frequent falls and inability to ambulate I do not think that he is a safe discharge.  Suspect that his decline is related to recent COVID-19 on top of him having Parkinson's.  Will discuss with the hospitalist for admission.  Clinical Course as of 01/05/22 0534  Tue Jan 05, 2022  0520 Procalcitonin: <0.10 [KM]    Clinical Course User Index [KM] Rada Hay, MD     FINAL CLINICAL IMPRESSION(S) /  ED DIAGNOSES   Final diagnoses:  Altered mental status, unspecified altered mental status type  Weakness     Rx / DC Orders   ED Discharge Orders     None        Note:  This document was prepared using Dragon voice recognition software and may include unintentional dictation errors.   Rada Hay, MD 01/05/22 [phone]

## 2022-01-05 NOTE — ED Notes (Signed)
Pt again taking off monitoring equipment. Reminded not to take off monitoring equipment.

## 2022-01-05 NOTE — Assessment & Plan Note (Signed)
Continue PPI ?

## 2022-01-05 NOTE — Consult Note (Signed)
Consultation Note Date: 01/05/2022   Patient Name: Cory [name]  DOB: Mar 27, 1956  MRN: [id number]  Age / Sex: 66 y.o., male  PCP: Adin Hector, MD Referring Physician: Collier Bullock, MD  Reason for Consultation: Establishing goals of care  HPI/Patient Profile: 66 y.o. male  with past medical history of Parkinson's disease stage IV or V per neurologist, CKD 3, cognitive impairment RT Parkinson's, history of BPH, seizure disorder, COPD, recent diagnosis with COVID, rotator cuff repair sleeping in recliner for 2 years admitted on 01/05/2022 with frequent falls, orthostatic hypotension, acute encephalopathy.   Clinical Assessment and Goals of Care: I have reviewed medical records including EPIC notes, labs and imaging, received report from RN, assessed the patient.  Cory [name] is lying quietly in bed.  He appears acutely/chronically ill and frail.  He will briefly make but not keep eye contact.  He seems quite ill at this time, and will mumble answers.  I do not believe that he can make his basic needs known.  His wife of 55 years, Cory [name], is present at bedside.    We meet at bedside to discuss diagnosis prognosis, GOC, EOL wishes, disposition and options.  I introduced Palliative Medicine as specialized medical care for people living with serious illness. It focuses on providing relief from the symptoms and stress of a serious illness. The goal is to improve quality of life for both the patient and the family.  We discussed a brief life review of the patient.  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  He and Cory [name] have been married for 33 years.  They have 1 adult son who is high functioning autistic who lives next-door.  Cory [name] shares that Cory [name] shoulders have been bothering him and he is has been sleeping in the recliner for the last 2 years.  He is requesting hospital bed also.  We then focused on their current  illness.  We talk about COVID and the treatment plan, time for outcomes.  At this point Mr. Mrs. Witherington would like to continue to treat the treatable, time for outcomes.  The natural disease trajectory and expectations at EOL were discussed.  Advanced directives, concepts specific to code status, artifical feeding and hydration, and rehospitalization were considered and discussed.  Wife and patient endorsed DNR.  Hospice and Palliative Care services outpatient were explained and offered.  Cory [name] shares that she has experience with hospice care with her mother and several family members.  She would prefer hospice care over palliative because of services provided.  Provider choice [facility].  Local team updated.  Discussed the importance of continued conversation with family and the medical providers regarding overall plan of care and treatment options, ensuring decisions are within the context of the patient's values and GOCs.  Questions and concerns were addressed.   The family was encouraged to call with questions or concerns.  PMT will continue to support holistically.  Conference with attending, bedside nursing staff, transition of care team, local [facility] hospice representative related to patient condition, needs,  goals of care, disposition.   HCPOA   NEXT OF KIN -wife of 15 years, Cory [name]    SUMMARY OF RECOMMENDATIONS   At this point continue to treat the treatable but no CPR or intubation Time for outcomes Requesting palliative/hospice care with [facility] upon discharge Requesting a hospital bed   Code Status/Advance Care Planning: DNR  Symptom Management:  Per hospitalist, no additional needs at this time.  Palliative Prophylaxis:  Oral Care and Turn Reposition  Additional Recommendations (Limitations, Scope, Preferences): Treat the treatable but no CPR or intubation  Psycho-social/Spiritual:  Desire for further Chaplaincy support:no Additional Recommendations: Caregiving   Support/Resources  Prognosis:  < 6 months 6 to 12 months or less would not be surprising based on chronic illness burden, 3 ED visits and 1 admit in the last 6 months, decreasing functional status, progression of chronic illness burden.  Discharge Planning: Home with Hospice      Primary Diagnoses: Present on Admission:  Parkinson's disease  Orthostatic hypotension  Hypereosinophilic syndrome (hes), unspecified  Depression  COVID-19 virus infection  Cognitive impairment  GERD (gastroesophageal reflux disease)  Stage 9a chronic kidney disease (CKD) (HCC)  COPD (chronic obstructive pulmonary disease) ([place])  Acute encephalopathy   I have reviewed the medical record, interviewed the patient and family, and examined the patient. The following aspects are pertinent.  Past Medical History:  Diagnosis Date   Abnormal ankle brachial index    Aortic atherosclerosis (HCC)    Arthritis    hips and back   Asthma    as a baby-    Bronchiectasis without complication (HCC)    CKD (chronic kidney disease) stage 9, GFR 30-59 ml/min (HCC)    Coronary artery calcification seen on CT scan    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    ED (erectile dysfunction)    Eosinophilia    GERD (gastroesophageal reflux disease)    rare -    History of BPH    History of genital warts    History of solitary pulmonary nodule    Left and right lung nodules on CT in ER 1/20; stable by CT 2/21   Hypertriglyceridemia    Microscopic hematuria    Other chronic pain    Parkinson's disease    Pneumonia    age 8   Post-traumatic osteoarthritis of left knee    Rash    legs- on going   Seizure-like activity ([place])    Umbilical hernia    Social History   Socioeconomic History   Marital status: Married    Spouse name: Not on file   Number of children: Not on file   Years of education: Not on file   Highest education level: Not on file  Occupational History   Not on file  Tobacco Use   Smoking status:  Some Days    Packs/day: 0.25    Years: 23.00    Total pack years: 5.75    Types: Cigarettes, Cigars   Smokeless tobacco: Never   Tobacco comments:    smokes 1 cigar per day 04/30/2016  Vaping Use   Vaping Use: Never used  Substance and Sexual Activity   Alcohol use: Not Currently    Alcohol/week: 1.0 standard drink of alcohol    Types: 1 Shots of liquor per week    Comment: has not had in 2 years   Drug use: No   Sexual activity: Not on file  Other Topics Concern   Not on file  Social History Narrative  Not on file   Social Determinants of Health   Financial Resource Strain: Not on file  Food Insecurity: Not on file  Transportation Needs: Not on file  Physical Activity: Not on file  Stress: Not on file  Social Connections: Not on file   Family History  Problem Relation Age of Onset   Hypertension Mother    Breast cancer Mother    Lung cancer Father    Breast cancer Sister    Scheduled Meds:  amantadine  100 mg Oral TID   ascorbic acid  500 mg Oral Daily   aspirin EC  81 mg Oral Daily   carbidopa-levodopa  1 tablet Oral QHS   carbidopa-levodopa  1 tablet Oral QID   docusate sodium  100 mg Oral Daily   enoxaparin (LOVENOX) injection  40 mg Subcutaneous Q24H   entacapone  200 mg Oral TID   fluticasone furoate-vilanterol  1 puff Inhalation Daily   And   umeclidinium bromide  1 puff Inhalation Daily   levETIRAcetam  250 mg Oral BID   levETIRAcetam  500 mg Oral BID   memantine  5 mg Oral Daily   midodrine  10 mg Oral TID WC   mirabegron ER  50 mg Oral Daily   pantoprazole  40 mg Oral Daily   pravastatin  10 mg Oral QHS   predniSONE  30 mg Oral Q breakfast   QUEtiapine  25 mg Oral BID   sertraline  50 mg Oral Daily   sodium chloride flush  3 mL Intravenous Q12H   zinc sulfate  220 mg Oral Daily   Continuous Infusions:  sodium chloride     PRN Meds:.sodium chloride, acetaminophen **OR** acetaminophen, albuterol, guaiFENesin, ondansetron **OR** ondansetron  (ZOFRAN) IV, sodium chloride flush, tiZANidine Medications Prior to Admission:  Prior to Admission medications   Medication Sig Start Date End Date Taking? Authorizing Provider  albuterol (VENTOLIN HFA) 108 (90 Base) MCG/ACT inhaler Inhale 1 puff into the lungs every 6 (six) hours as needed for wheezing. 12/02/21  Yes [provider]  amantadine (SYMMETREL) 100 MG capsule Take 100 mg by mouth 3 (three) times daily. 12/29/21  Yes [provider]  aspirin EC 81 MG tablet Take 81 mg by mouth daily. Swallow whole.   Yes [provider]  carbidopa-levodopa (SINEMET CR) 50-200 MG tablet Take 1 tablet by mouth at bedtime.   Yes [provider]  carbidopa-levodopa (SINEMET IR) 25-100 MG tablet Take 2 tablets by mouth 3 (three) times daily.    Yes [provider]  docusate sodium (COLACE) 100 MG capsule Take 100 mg by mouth daily.   Yes [provider]  entacapone (COMTAN) 200 MG tablet Take 200 mg by mouth 3 (three) times daily. 04/17/20  Yes [provider]  Fluticasone-Umeclidin-Vilant (TRELEGY ELLIPTA) 100-62.5-25 MCG/ACT AEPB Inhale 1 puff into the lungs daily as needed.   Yes [provider]  levETIRAcetam (KEPPRA) 250 MG tablet Take 1 tablet (250 mg total) by mouth 2 (two) times daily. Patient taking differently: Take 250 mg by mouth 2 (two) times daily. Take along with 500 mg for total 750 mg twice daily 07/31/21  Yes Lavonia Drafts, MD  levETIRAcetam (KEPPRA) 500 MG tablet Take 500 mg by mouth 2 (two) times daily. Take along with '250mg'$  for total of '750mg'$  07/08/21 07/08/22 Yes [provider]  memantine (NAMENDA) 5 MG tablet Take 5 mg by mouth daily.   Yes [provider]  midodrine (PROAMATINE) 10 MG tablet Take 10  mg by mouth 3 (three) times daily with meals. 11/04/21 11/04/22 Yes [provider]  MYRBETRIQ 50 MG TB24 tablet Take 50 mg by mouth at bedtime. 03/06/20  Yes [provider]  omeprazole  (PRILOSEC) 20 MG capsule Take 1 capsule (20 mg total) by mouth daily. 11/06/21  Yes Earlie Server, MD  pravastatin (PRAVACHOL) 10 MG tablet Take 10 mg by mouth at bedtime. 12/29/21  Yes [provider]  predniSONE (DELTASONE) 10 MG tablet Take 3 tablets (30 mg total) by mouth daily with breakfast. 12/16/21  Yes Earlie Server, MD  sertraline (ZOLOFT) 50 MG tablet Take 50 mg by mouth daily.   Yes [provider]  tamsulosin (FLOMAX) 0.4 MG CAPS capsule Take 1 capsule (0.4 mg total) by mouth daily after supper. 04/08/18  Yes Merlyn Lot, MD  tiZANidine (ZANAFLEX) 4 MG tablet Take 4 mg by mouth daily as needed for muscle spasms.   Yes [provider]  cyanocobalamin 1000 MCG tablet Take 1 tablet (1,000 mcg total) by mouth daily. Patient not taking: Reported on 01/05/2022 05/25/20   Jennye Boroughs, MD  fluticasone-salmeterol (ADVAIR) 250-50 MCG/ACT AEPB Inhale 1 puff into the lungs as needed. 02/24/21   [provider]  NAYZILAM 5 MG/0.1ML SOLN Place into both nostrils. Patient not taking: Reported on 11/06/2021 08/04/21   [provider]   No Known Allergies Review of Systems  Unable to perform ROS: Acuity of condition    Physical Exam Vitals and nursing note reviewed.  Constitutional:      Appearance: He is ill-appearing.  Cardiovascular:     Rate and Rhythm: Normal rate.  Pulmonary:     Effort: Pulmonary effort is normal. No respiratory distress.     Vital Signs: BP 124/78 (BP Location: Right Arm)   Pulse 95   Temp 98.2 F (36.8 C) (Oral)   Resp (!) 22   Ht '5\' 7"'$  (1.702 m)   Wt 76.8 kg   SpO2 95%   BMI 26.52 kg/m      Pain Score: 0-No pain   SpO2: SpO2: 95 % O2 Device:SpO2: 95 % O2 Flow Rate: .   IO: Intake/output summary:  Intake/Output Summary (Last 24 hours) at 01/05/2022 1548 Last data filed at 01/05/2022 0543 Gross per 24 hour  Intake 1000 ml  Output --  Net 1000 ml    LBM:   Baseline Weight: Weight: 76.8 kg Most recent weight:  Weight: 76.8 kg     Palliative Assessment/Data:   Flowsheet Rows    Flowsheet Row Most Recent Value  Intake Tab   Referral Department Hospitalist  Unit at Time of Referral Med/Surg Unit  Palliative Care Primary Diagnosis Other (Comment)  Date Notified 01/05/22  Palliative Care Type New Palliative care  Reason for referral Clarify Goals of Care  Date of Admission 01/05/22  Date first seen by Palliative Care 01/05/22  # of days Palliative referral response time 0 Day(s)  # of days IP prior to Palliative referral 0  Clinical Assessment   Palliative Performance Scale Score 20%  Pain Max last 24 hours Not able to report  Pain Min Last 24 hours Not able to report  Dyspnea Max Last 24 Hours Not able to report  Dyspnea Min Last 24 hours Not able to report  Psychosocial & Spiritual Assessment   Palliative Care Outcomes        Time In: 1445 Time Out: 1600 Time Total: 75 minutes  Greater than 50%  of this time was spent counseling and  coordinating care related to the above assessment and plan.  Signed by: Drue Novel, NP   Please contact Palliative Medicine Team phone at [phone] for questions and concerns.  For individual provider: See Shea Evans

## 2022-01-05 NOTE — Assessment & Plan Note (Signed)
Patient was diagnosed with COVID-19 viral infection 8 days prior to this hospitalization and completed a course of oral antiviral agent. Continue supportive care with bronchodilator therapy, antitussives, vitamins Continue airborne and contact precautions

## 2022-01-05 NOTE — Assessment & Plan Note (Signed)
Patient with a history of Parkinson's disease who presents for evaluation of frequent falls which may be secondary to orthostatic hypotension related to his Parkinson's disease versus from generalized weakness from acute COVID-19 viral infection. Fall precautions PT evaluation

## 2022-01-05 NOTE — Assessment & Plan Note (Signed)
Continue Sinemet and entacapone

## 2022-01-05 NOTE — Assessment & Plan Note (Signed)
Patient presents to the ER for evaluation of generalized weakness and frequent falls following recent diagnosis of COVID-19 viral illness. Place patient on fall precautions PT evaluation

## 2022-01-05 NOTE — ED Notes (Signed)
Condom catheter removed and replaced with a purewick. Pt denies complaints at this time.

## 2022-01-05 NOTE — ED Triage Notes (Signed)
Pt arrived via [facility] from home where pts spouse called due to increased weakness, AMS, hallucinations and possible seizure like activity this AM. Per EMS, spouse witnessed pt become ridged while standing and reported his previous seizures looked similar. Spouse denied pt falling or hitting head. Pt with recent COVID diagnosis in last 10-12 days. Pt alert and answering questions appropriately.

## 2022-01-05 NOTE — ED Notes (Signed)
Pt continues to take monitoring equipment off. Reminded to keep on.

## 2022-01-05 NOTE — Assessment & Plan Note (Signed)
Continue as needed bronchodilator therapy as well as inhaled steroids 

## 2022-01-05 NOTE — Assessment & Plan Note (Signed)
Renal function is stable.

## 2022-01-05 NOTE — Assessment & Plan Note (Signed)
Patient with worsening confusion and visual hallucinations over the last several days which may be secondary to delirium related to COVID-19 viral illness. CT scan of the head shows new infarct, age-indeterminate in the right occipital cortex  Obtain MRI of the brain for further evaluation We will request psych evaluation and recommendations

## 2022-01-05 NOTE — ED Notes (Signed)
Pts wife gave pts home Carbidopa morning dose. MD notified.

## 2022-01-05 NOTE — Assessment & Plan Note (Signed)
Continue sertraline 

## 2022-01-05 NOTE — H&P (Signed)
History and Physical    Patient: Cory [name] GGY:[id number] DOB: May 08, 1955 DOA: 01/05/2022 DOS: the patient was seen and examined on 01/05/2022 PCP: Adin Hector, MD  Patient coming from: Home  Chief Complaint:  Chief Complaint  Patient presents with   Altered Mental Status    Most of the history was obtained from patient's wife at the bedside HPI: Cory [name] is a 66 y.o. male with medical history significant for Parkinson's disease, stage IIIb chronic kidney disease, cognitive impairment related to known Parkinson's disease, history of BPH, seizure disorder, COPD, recently diagnosed with COVID-19 viral infection 8 days prior to this hospitalization who was brought into the ER by EMS for evaluation of generalized weakness, frequent falls, visual hallucinations and seizure-like activity the night prior to his admission. Patient's wife notes that they both have COVID-19 viral infection which was diagnosed 8 days ago.  Patient's symptoms were mostly cough which is not productive of yellow phlegm and weakness.  At baseline he is usually able to get around with a rolling walker but has not been able to do that for the last several days due to generalized weakness.  She attempted to get him to the bathroom using his walker and she states that he stiffened up and fell backwards and had what appeared to be a generalized tonic-clonic seizure lasting about 45 seconds.  She notes that ever since he was diagnosed with COVID 19 viral infection he has had worsening visual hallucinations and confusion.  He had 3 falls within the last 24 hours. Per patient he thinks that every time he stands up he feels like he has no balance and no strength in his legs and he falls. He denies having any fever, no chills, no shortness of breath, no headache, no nausea, no vomiting, no urinary symptoms, no blurred vision no focal deficit.   Review of Systems: As mentioned in the history of present illness. All  other systems reviewed and are negative. 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    ED (erectile dysfunction)    Eosinophilia    GERD (gastroesophageal reflux disease)    rare -    History of BPH    History of genital warts    History of solitary pulmonary nodule    Left and right lung nodules on CT in ER 1/20; stable by CT 2/21   Hypertriglyceridemia    Microscopic hematuria    Other chronic pain    Parkinson's disease    Pneumonia    age 60   Post-traumatic osteoarthritis of left knee    Rash    legs- on going   Seizure-like activity ([place])    Umbilical hernia    Past Surgical History:  Procedure Laterality Date   APPENDECTOMY     BACK SURGERY     posterior lumbar spine fusion L4-5   COLONOSCOPY     COLONOSCOPY WITH PROPOFOL N/A 06/06/2017   Procedure: COLONOSCOPY WITH PROPOFOL;  Surgeon: Manya Silvas, MD;  Location: [facility] ENDOSCOPY;  Service: Endoscopy;  Laterality: N/A;   KNEE ARTHROSCOPY Bilateral  ACL   KNEE ARTHROSCOPY W/ ACL RECONSTRUCTION     RADIAL KERATOTOMY     ROTATOR CUFF REPAIR Bilateral    SHOULDER ARTHROSCOPY WITH BICEPS TENDON REPAIR Left 04/16/2019   Procedure: SHOULDER ARTHROSCOPY WITH BICEPS TENDON REPAIR, MINI OPEN SUPERIOR CAPSULAR RECONSTRUCTION, BICEPS TENODESIS;  Surgeon: Leim Fabry, MD;  Location: [facility] ORS;  Service: Orthopedics;  Laterality: Left;   Social History:  reports that he has been smoking cigarettes and cigars. He has a 5.75 pack-year smoking history. He has never used smokeless tobacco. He reports that he does not currently use alcohol after a past usage of about 1.0 standard drink of alcohol per week. He reports that  he does not use drugs.  No Known Allergies  Family History  Problem Relation Age of Onset   Hypertension Mother    Breast cancer Mother    Lung cancer Father    Breast cancer Sister     Prior to Admission medications   Medication Sig Start Date End Date Taking? Authorizing Provider  albuterol (VENTOLIN HFA) 108 (90 Base) MCG/ACT inhaler Inhale 1 puff into the lungs every 6 (six) hours as needed for wheezing. 12/02/21  Yes [provider]  amantadine (SYMMETREL) 100 MG capsule Take 100 mg by mouth 3 (three) times daily. 12/29/21  Yes [provider]  aspirin EC 81 MG tablet Take 81 mg by mouth daily. Swallow whole.   Yes [provider]  carbidopa-levodopa (SINEMET CR) 50-200 MG tablet Take 1 tablet by mouth at bedtime.   Yes [provider]  carbidopa-levodopa (SINEMET IR) 25-100 MG tablet Take 2 tablets by mouth 3 (three) times daily.    Yes [provider]  docusate sodium (COLACE) 100 MG capsule Take 100 mg by mouth daily.   Yes [provider]  entacapone (COMTAN) 200 MG tablet Take 200 mg by mouth 3 (three) times daily. 04/17/20  Yes [provider]  Fluticasone-Umeclidin-Vilant (TRELEGY ELLIPTA) 100-62.5-25 MCG/ACT AEPB Inhale 1 puff into the lungs daily as needed.   Yes [provider]  levETIRAcetam (KEPPRA) 250 MG tablet Take 1 tablet (250 mg total) by mouth 2 (two) times daily. Patient taking differently: Take 250 mg by mouth 2 (two) times daily. Take along with 500 mg for total 750 mg twice daily 07/31/21  Yes Lavonia Drafts, MD  levETIRAcetam (KEPPRA) 500 MG tablet Take 500 mg by mouth 2 (two) times daily. Take along with '250mg'$  for total of '750mg'$  07/08/21 07/08/22 Yes [provider]  memantine (NAMENDA) 5 MG tablet Take 5 mg by mouth daily.   Yes [provider]  midodrine (PROAMATINE) 10 MG tablet Take 10 mg by mouth 3 (three) times daily with meals. 11/04/21 11/04/22 Yes [provider]   MYRBETRIQ 50 MG TB24 tablet Take 50 mg by mouth at bedtime. 03/06/20  Yes [provider]  omeprazole (PRILOSEC) 20 MG capsule Take 1 capsule (20 mg total) by mouth daily. 11/06/21  Yes Earlie Server, MD  pravastatin (PRAVACHOL) 10 MG tablet Take 10 mg by mouth at bedtime. 12/29/21  Yes [provider]  predniSONE (DELTASONE) 10 MG tablet Take 3 tablets (30 mg total) by mouth daily with breakfast. 12/16/21  Yes Earlie Server, MD  sertraline (ZOLOFT) 50 MG tablet Take 50 mg by mouth daily.   Yes [provider]  tamsulosin (FLOMAX) 0.4 MG CAPS capsule Take 1 capsule (0.4 mg total) by mouth daily after supper. 04/08/18  Yes Merlyn Lot, MD  tiZANidine (ZANAFLEX) 4 MG tablet Take  4 mg by mouth daily as needed for muscle spasms.   Yes [provider]  cyanocobalamin 1000 MCG tablet Take 1 tablet (1,000 mcg total) by mouth daily. Patient not taking: Reported on 01/05/2022 05/25/20   Jennye Boroughs, MD  fluticasone-salmeterol (ADVAIR) 250-50 MCG/ACT AEPB Inhale 1 puff into the lungs as needed. 02/24/21   [provider]  NAYZILAM 5 MG/0.1ML SOLN Place into both nostrils. Patient not taking: Reported on 11/06/2021 08/04/21   [provider]    Physical Exam: Vitals:   01/05/22 0600 01/05/22 0630 01/05/22 0730 01/05/22 0823  BP: (!) 141/92 (!) 140/85 135/80   Pulse: 85 88 89   Resp: (!) '22 18 15   '$ Temp:    97.9 F (36.6 C)  TempSrc:    Oral  SpO2: 96% 96% 95%   Weight:      Height:       Physical Exam Vitals and nursing note reviewed.  Constitutional:      Comments: Oriented to person and place.  Has tremors involving his upper extremities  HENT:     Head: Normocephalic and atraumatic.     Nose: Nose normal.     Mouth/Throat:     Mouth: Mucous membranes are moist.  Eyes:     Conjunctiva/sclera: Conjunctivae normal.  Cardiovascular:     Rate and Rhythm: Normal rate and regular rhythm.  Pulmonary:     Breath sounds: Wheezing present.  Abdominal:      General: Abdomen is flat. Bowel sounds are normal.     Palpations: Abdomen is soft.  Musculoskeletal:        General: Normal range of motion.     Cervical back: Normal range of motion and neck supple.  Skin:    General: Skin is warm and dry.  Neurological:     Mental Status: He is alert.     Motor: Weakness present.  Psychiatric:        Mood and Affect: Mood normal.        Behavior: Behavior normal.     Data Reviewed: Relevant notes from primary care and specialist visits, past discharge summaries as available in EHR, including Care Everywhere. Prior diagnostic testing as pertinent to current admission diagnoses Updated medications and problem lists for reconciliation ED course, including vitals, labs, imaging, treatment and response to treatment Triage notes, nursing and pharmacy notes and ED provider's notes Notable results as noted in HPI Labs reviewed.  Troponin 35, procalcitonin less than 0.10, sodium 137, potassium 4.0, chloride 108, bicarb 22, glucose 88, BUN 32, creatinine 1.78, calcium 8.4, total protein 6.4, bili 3.1, AST 18, ALT 7, alkaline phosphatase 53, total bilirubin 1.0, white count 12.4, hemoglobin 13.0, hematocrit 39.5, platelet count 172 SARS coronavirus 2 PCR is positive Chest x-ray reviewed by me shows chronic interstitial CT scan of the head without contrast shows small right occipital cortex infarct which is age indeterminate, new since 08/13/2021.Small chronic left cerebellar infarct which is also new from 08/13/2021. Twelve-lead EKG reviewed by me shows sinus rhythm There are no new results to review at this time.  Assessment and Plan: * Frequent falls Patient with a history of Parkinson's disease who presents for evaluation of frequent falls which may be secondary to orthostatic hypotension related to his Parkinson's disease versus from generalized weakness from acute COVID-19 viral infection. 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 CT scan of the head shows new infarct, age-indeterminate in the right occipital cortex  Obtain MRI of the brain for further evaluation We will request psych evaluation and recommendations  COPD (chronic obstructive pulmonary disease) ([place]) Continue as needed bronchodilator therapy as well as inhaled steroids  Cognitive impairment Secondary to Parkinson's disease Continue Namenda  COVID-19 virus infection Patient was diagnosed with COVID-19 viral infection 8 days prior to this hospitalization and completed a course of oral antiviral agent. Continue supportive care with bronchodilator therapy, antitussives, vitamins Continue airborne and contact precautions  Generalized weakness Patient presents to the ER for evaluation of generalized weakness and frequent falls following recent diagnosis of COVID-19 viral illness. 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  All questions and concerns have been addressed.  She verbalizes understanding and agrees with the plan.  Severity of Illness: The appropriate patient status  for this patient is INPATIENT. Inpatient status is judged to be reasonable and necessary in order to provide the required intensity of service to ensure the patient's safety. The patient's presenting symptoms, physical exam findings, and initial radiographic and laboratory data in the context of their chronic comorbidities is felt to place them at high risk for further clinical deterioration. Furthermore, it is not anticipated that the patient will be medically stable for discharge from the hospital within 2 midnights of admission.   * I certify that at the point of admission it is my clinical judgment that the patient will require inpatient hospital care spanning beyond 2 midnights from the point of admission due to high intensity of service, high risk for further deterioration and high frequency of surveillance required.*  Author: Collier Bullock, MD 01/05/2022 9:15 AM  For on call review [email or web address].

## 2022-01-05 NOTE — Consult Note (Addendum)
[facility] Consult   Reason for Consult:  hallucinations Referring Physician:  Agbata Patient Identification: Cory [name] MRN:  [id number] Principal Diagnosis: Hallucination, visual Diagnosis:  Principal Problem:   Hallucination, visual Active Problems:   Parkinson's disease   Depression   Hypereosinophilic syndrome (hes), unspecified   Seizure ([place])   Stage 3a chronic kidney disease (CKD) ([place])   GERD (gastroesophageal reflux disease)   Orthostatic hypotension   Frequent falls   Generalized weakness   COVID-19 virus infection   Cognitive impairment   COPD (chronic obstructive pulmonary disease) ([place])   Acute encephalopathy   Total Time spent with patient: 45 minutes  Subjective: "I feel terrible." Cory [name] is a 67 y.o. male patient admitted with COVID-19,  others as above.  Psychiatry consulted for hallucinations  HPI: Patient presented to the ED from home due to increased weakness, altered mental status, visual hallucinations, possible seizure-like activity.  Patient has a history of Parkinson's disease.  He currently is positive for COVID-19 infection.  On evaluation, patient is visibly very ill appearing.  His wife is at bedside.  Patient answers in short bursts.  He states that he feels terrible.  He is repositioning himself on the gurney many times, agitated.  He states that he is very uncomfortable.  Patient does ask "is it normal to have hallucinations."  Patient also talks about things that his wife said happen a long time ago or not at all.  He does seem to have it be having delusions that he went out to the shooting range yesterday.  Much of the time his eyes are partially closed during evaluation.  Both patient and wife would like something to help with the delusions and possibly some agitation so that he can get some rest.  Wife reports that he has had visual hallucinations for some time, but he realizes they are hallucinations and they have  not really bothered him before patient got COVID.  She does not believe he has ever had any medication for hallucinations.  Prescribed quetiapine 25 mg twice a day to start him a titrate as needed.  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  She said since he has had COVID he can sometimes get more verbally aggressive at home and she states "we are going to need some help."  They have 1 adult son who has high functioning autism and he lives next door to them.  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    ED (erectile dysfunction)    Eosinophilia    GERD (gastroesophageal reflux disease)    rare -    History of BPH    History of genital warts    History of solitary pulmonary nodule  Left and right lung nodules on CT in ER 1/20; stable by CT 2/21   Hypertriglyceridemia    Microscopic hematuria    Other chronic pain    Parkinson's disease    Pneumonia    age 1   Post-traumatic osteoarthritis of left knee    Rash    legs- on going   Seizure-like activity [facility])    Umbilical hernia     Past Surgical History:  Procedure Laterality Date   APPENDECTOMY     BACK SURGERY     posterior lumbar spine fusion L4-5   COLONOSCOPY     COLONOSCOPY WITH PROPOFOL N/A 06/06/2017   Procedure: COLONOSCOPY WITH PROPOFOL;  Surgeon: Manya Silvas, MD;  Location:  [facility] ENDOSCOPY;  Service: Endoscopy;  Laterality: N/A;   KNEE ARTHROSCOPY Bilateral    ACL   KNEE ARTHROSCOPY W/ ACL RECONSTRUCTION     RADIAL KERATOTOMY     ROTATOR CUFF REPAIR Bilateral    SHOULDER ARTHROSCOPY WITH BICEPS TENDON REPAIR Left 04/16/2019   Procedure: SHOULDER ARTHROSCOPY WITH BICEPS TENDON REPAIR, MINI OPEN SUPERIOR CAPSULAR RECONSTRUCTION, BICEPS TENODESIS;  Surgeon: Leim Fabry, MD;  Location: [facility] ORS;  Service: Orthopedics;  Laterality: Left;   Family History:  Family History  Problem Relation Age of Onset   Hypertension Mother    Breast cancer Mother    Lung cancer Father    Breast cancer Sister    Family Psychiatric  History:  Social History:  Social History   Substance and Sexual Activity  Alcohol Use Not Currently   Alcohol/week: 1.0 standard drink of alcohol   Types: 1 Shots of liquor per week   Comment: has not had in 2 years     Social History   Substance and Sexual Activity  Drug Use No    Social History   Socioeconomic History   Marital status: Married    Spouse name: Not on file   Number of children: Not on file   Years of education: Not on file   Highest education level: Not on file  Occupational History   Not on file  Tobacco Use   Smoking status: Some Days    Packs/day: 0.25    Years: 23.00    Total pack years: 5.75    Types: Cigarettes, Cigars   Smokeless tobacco: Never   Tobacco comments:    smokes 1 cigar per day 04/30/2016  Vaping Use   Vaping Use: Never used  Substance and Sexual Activity   Alcohol use: Not Currently    Alcohol/week: 1.0 standard drink of alcohol    Types: 1 Shots of liquor per week    Comment: has not had in 2 years   Drug use: No   Sexual activity: Not on file  Other Topics Concern   Not on file  Social History Narrative   Not on file   Social Determinants of Health   Financial Resource Strain: Not on file  Food Insecurity: Not on file  Transportation Needs: Not on file  Physical Activity:  Not on file  Stress: Not on file  Social Connections: Not on file   Additional Social History:    Allergies:  No Known Allergies  Labs:  Results for orders placed or performed during the hospital encounter of 01/05/22 (from the past 48 hour(s))  Troponin I (High Sensitivity)     Status: Abnormal   Collection Time: 01/05/22  4:08 AM  Result Value Ref Range   Troponin I (High Sensitivity) 40 (H) <18 ng/L  Comment: (NOTE) Elevated high sensitivity troponin I (hsTnI) values and significant  changes across serial measurements may suggest ACS but many other  chronic and acute conditions are known to elevate hsTnI results.  Refer to the "Links" section for chest pain algorithms and additional  guidance. Performed at [facility], [address] [place], [place] [address]   Procalcitonin - Baseline     Status: None   Collection Time: 01/05/22  4:08 AM  Result Value Ref Range   Procalcitonin <0.10 ng/mL    Comment:        Interpretation: PCT (Procalcitonin) <= 0.5 ng/mL: Systemic infection (sepsis) is not likely. Local bacterial infection is possible. (NOTE)       Sepsis PCT Algorithm           Lower Respiratory Tract                                      Infection PCT Algorithm    ----------------------------     ----------------------------         PCT < 0.25 ng/mL                PCT < 0.10 ng/mL          Strongly encourage             Strongly discourage   discontinuation of antibiotics    initiation of antibiotics    ----------------------------     -----------------------------       PCT 0.25 - 0.50 ng/mL            PCT 0.10 - 0.25 ng/mL               OR       >80% decrease in PCT            Discourage initiation of                                            antibiotics      Encourage discontinuation           of antibiotics    ----------------------------     -----------------------------         PCT >= 0.50 ng/mL              PCT 0.26 - 0.50 ng/mL                AND        <80% decrease in PCT             Encourage initiation of                                             antibiotics       Encourage continuation           of antibiotics    ----------------------------     -----------------------------        PCT >= 0.50 ng/mL                  PCT > 0.50 ng/mL               AND  increase in PCT                  Strongly encourage                                      initiation of antibiotics    Strongly encourage escalation           of antibiotics                                     -----------------------------                                           PCT <= 0.25 ng/mL                                                 OR                                        > 80% decrease in PCT                                      Discontinue / Do not initiate                                             antibiotics  Performed at [facility], [address] [place], [place] [address]   Lactic acid, plasma     Status: None   Collection Time: 01/05/22  4:08 AM  Result Value Ref Range   Lactic Acid, Venous 1.7 0.5 - 1.9 mmol/L    Comment: Performed at [facility], [address] [place], [place] [address]  CBC with Differential     Status: Abnormal   Collection Time: 01/05/22  4:08 AM  Result Value Ref Range   WBC 12.4 (H) 4.0 - 10.5 K/uL   RBC 4.44 4.22 - 5.81 MIL/uL   Hemoglobin 13.0 13.0 - 17.0 g/dL   HCT 39.5 39.0 - 52.0 %   MCV 89.0 80.0 - 100.0 fL   MCH 29.3 26.0 - 34.0 pg   MCHC 32.9 30.0 - 36.0 g/dL   RDW 14.6 11.5 - 15.5 %   Platelets 172 150 - 400 K/uL   nRBC 0.0 0.0 - 0.2 %   Neutrophils Relative % 72 %   Neutro Abs 9.0 (H) 1.7 - 7.7 K/uL   Lymphocytes Relative 13 %   Lymphs Abs 1.7 0.7 - 4.0 K/uL   Monocytes Relative 6 %   Monocytes Absolute 0.7 0.1 - 1.0 K/uL   Eosinophils Relative 0 %   Eosinophils Absolute 0.0 0.0 - 0.5 K/uL   Basophils Relative 1 %   Basophils Absolute 0.1 0.0 - 0.1 K/uL    WBC Morphology MILD LEFT SHIFT (1-5% METAS, OCC MYELO, OCC  BANDS)    RBC Morphology MORPHOLOGY UNREMARKABLE    Smear Review Normal platelet morphology    Immature Granulocytes 8 %   Abs Immature Granulocytes 0.93 (H) 0.00 - 0.07 K/uL    Comment: Performed at [facility], [address] [place], [place] [address]  Comprehensive metabolic panel     Status: Abnormal   Collection Time: 01/05/22  4:08 AM  Result Value Ref Range   Sodium 137 135 - 145 mmol/L   Potassium 4.0 3.5 - 5.1 mmol/L   Chloride 108 98 - 111 mmol/L   CO2 22 22 - 32 mmol/L   Glucose, Bld 88 70 - 99 mg/dL    Comment: Glucose reference range applies only to samples taken after fasting for at least 8 hours.   BUN 32 (H) 8 - 23 mg/dL   Creatinine, Ser 1.78 (H) 0.61 - 1.24 mg/dL   Calcium 8.4 (L) 8.9 - 10.3 mg/dL   Total Protein 6.4 (L) 6.5 - 8.1 g/dL   Albumin 3.1 (L) 3.5 - 5.0 g/dL   AST 18 15 - 41 U/L   ALT 7 0 - 44 U/L   Alkaline Phosphatase 53 38 - 126 U/L   Total Bilirubin 1.0 0.3 - 1.2 mg/dL   GFR, Estimated 41 (L) >60 mL/min    Comment: (NOTE) Calculated using the CKD-EPI Creatinine Equation (2021)    Anion gap 7 5 - 15    Comment: Performed at [facility], [address] [place], [place] [address]  Blood culture (routine x 2)     Status: None (Preliminary result)   Collection Time: 01/05/22  4:09 AM   Specimen: BLOOD LEFT FOREARM  Result Value Ref Range   Specimen Description BLOOD LEFT FOREARM    Special Requests      BOTTLES DRAWN AEROBIC AND ANAEROBIC Blood Culture results may not be optimal due to an excessive volume of blood received in culture bottles   Culture      NO GROWTH < 12 HOURS Performed at [facility], [address] [place], [place] [address]    Report Status PENDING   Blood culture (routine x 2)     Status: None (Preliminary result)   Collection Time: 01/05/22  4:09 AM   Specimen: BLOOD LEFT HAND  Result Value Ref Range   Specimen Description BLOOD  LEFT HAND    Special Requests      BOTTLES DRAWN AEROBIC AND ANAEROBIC Blood Culture results may not be optimal due to an excessive volume of blood received in culture bottles   Culture      NO GROWTH < 12 HOURS Performed at [facility], [address] [place], [place] [address]    Report Status PENDING   Urinalysis, Routine w reflex microscopic Urine, Clean Catch     Status: Abnormal   Collection Time: 01/05/22  5:44 AM  Result Value Ref Range   Color, Urine YELLOW (A) YELLOW   APPearance CLEAR (A) CLEAR   Specific Gravity, Urine 1.013 1.005 - 1.030   pH 6.0 5.0 - 8.0   Glucose, UA NEGATIVE NEGATIVE mg/dL   Hgb urine dipstick NEGATIVE NEGATIVE   Bilirubin Urine NEGATIVE NEGATIVE   Ketones, ur NEGATIVE NEGATIVE mg/dL   Protein, ur 30 (A) NEGATIVE mg/dL   Nitrite NEGATIVE NEGATIVE   Leukocytes,Ua NEGATIVE NEGATIVE   RBC / HPF 6-10 0 - 5 RBC/hpf   WBC, UA 0-5 0 - 5 WBC/hpf   Bacteria, UA NONE SEEN NONE SEEN  Squamous Epithelial / LPF NONE SEEN 0 - 5   Hyaline Casts, UA PRESENT     Comment: Performed at [facility], [address] [place], [place] [address]  SARS Coronavirus 2 by RT PCR (hospital order, performed in [facility] hospital lab) *cepheid single result test* Anterior Nasal Swab     Status: Abnormal   Collection Time: 01/05/22  5:44 AM   Specimen: Anterior Nasal Swab  Result Value Ref Range   SARS Coronavirus 2 by RT PCR POSITIVE (A) NEGATIVE    Comment: (NOTE) SARS-CoV-2 target nucleic acids are DETECTED  SARS-CoV-2 RNA is generally detectable in upper respiratory specimens  during the acute phase of infection.  Positive results are indicative  of the presence of the identified virus, but do not rule out bacterial infection or co-infection with other pathogens not detected by the test.  Clinical correlation with patient history and  other diagnostic information is necessary to determine patient infection status.  The expected result is  negative.  Fact Sheet for Patients:   https://www.patel.info/   Fact Sheet for Healthcare Providers:   https://hall.com/    This test is not yet approved or cleared by the [place] FDA and  has been authorized for detection and/or diagnosis of SARS-CoV-2 by FDA under an Emergency Use Authorization (EUA).  This EUA will remain in effect (meaning this test can be used) for the duration of  the COVID-19 declaration under Section 564(b)(1)  of the Act, 21 U.S.C. section 360-bbb-3(b)(1), unless the authorization is terminated or revoked sooner.   Performed at [facility], [address], [place] [address]   Troponin I (High Sensitivity)     Status: Abnormal   Collection Time: 01/05/22  6:03 AM  Result Value Ref Range   Troponin I (High Sensitivity) 35 (H) <18 ng/L    Comment: (NOTE) Elevated high sensitivity troponin I (hsTnI) values and significant  changes across serial measurements may suggest ACS but many other  chronic and acute conditions are known to elevate hsTnI results.  Refer to the "Links" section for chest pain algorithms and additional  guidance. Performed at [facility], [address] [place], [place] [address]   Blood gas, venous     Status: Abnormal (Preliminary result)   Collection Time: 01/05/22  9:57 AM  Result Value Ref Range   pH, Ven 7.48 (H) 7.25 - 7.43   pCO2, Ven 35 (L) 44 - 60 mmHg   pO2, Ven 48 (H) 32 - 45 mmHg   Bicarbonate 26.1 20.0 - 28.0 mmol/L   Acid-Base Excess 2.8 (H) 0.0 - 2.0 mmol/L   O2 Saturation 83.4 %   Patient temperature 37.0    Collection site VENOUS     Comment: Performed at [facility], [address], [place], [place] [address]   Drawn by PENDING     Current Facility-Administered Medications  Medication Dose Route Frequency Provider Last Rate Last Admin   0.9 %  sodium chloride infusion  250 mL Intravenous PRN Agbata, Tochukwu, MD        acetaminophen (TYLENOL) tablet 650 mg  650 mg Oral Q6H PRN Agbata, Tochukwu, MD       Or   acetaminophen (TYLENOL) suppository 650 mg  650 mg Rectal Q6H PRN Agbata, Tochukwu, MD       albuterol (VENTOLIN HFA) 108 (90 Base) MCG/ACT inhaler 1 puff  1 puff Inhalation Q6H PRN Agbata, Tochukwu, MD       amantadine (SYMMETREL) capsule  100 mg  100 mg Oral TID Collier Bullock, MD   100 mg at 01/05/22 0945   ascorbic acid (VITAMIN C) tablet 500 mg  500 mg Oral Daily Agbata, Tochukwu, MD   500 mg at 01/05/22 0945   aspirin EC tablet 81 mg  81 mg Oral Daily Agbata, Tochukwu, MD   81 mg at 01/05/22 0945   carbidopa-levodopa (SINEMET CR) 50-200 MG per tablet controlled release 1 tablet  1 tablet Oral QHS Agbata, Tochukwu, MD       carbidopa-levodopa (SINEMET IR) 25-100 MG per tablet immediate release 1 tablet  1 tablet Oral QID Benita Gutter, RPH       docusate sodium (COLACE) capsule 100 mg  100 mg Oral Daily Agbata, Tochukwu, MD   100 mg at 01/05/22 0945   enoxaparin (LOVENOX) injection 40 mg  40 mg Subcutaneous Q24H Agbata, Tochukwu, MD   40 mg at 01/05/22 0944   entacapone (COMTAN) tablet 200 mg  200 mg Oral TID Agbata, Tochukwu, MD   200 mg at 01/05/22 0943   fluticasone furoate-vilanterol (BREO ELLIPTA) 100-25 MCG/ACT 1 puff  1 puff Inhalation Daily Agbata, Tochukwu, MD       And   umeclidinium bromide (INCRUSE ELLIPTA) 62.5 MCG/ACT 1 puff  1 puff Inhalation Daily Agbata, Tochukwu, MD       guaiFENesin (ROBITUSSIN) 100 MG/5ML liquid 5 mL  5 mL Oral Q4H PRN Agbata, Tochukwu, MD       levETIRAcetam (KEPPRA) tablet 250 mg  250 mg Oral BID Agbata, Tochukwu, MD   250 mg at 01/05/22 0944   levETIRAcetam (KEPPRA) tablet 500 mg  500 mg Oral BID Agbata, Tochukwu, MD   500 mg at 01/05/22 0949   memantine (NAMENDA) tablet 5 mg  5 mg Oral Daily Agbata, Tochukwu, MD   5 mg at 01/05/22 0945   midodrine (PROAMATINE) tablet 10 mg  10 mg Oral TID WC Agbata, Tochukwu, MD       mirabegron ER (MYRBETRIQ) tablet 50  mg  50 mg Oral Daily Agbata, Tochukwu, MD       ondansetron (ZOFRAN) tablet 4 mg  4 mg Oral Q6H PRN Agbata, Tochukwu, MD       Or   ondansetron (ZOFRAN) injection 4 mg  4 mg Intravenous Q6H PRN Agbata, Tochukwu, MD       pantoprazole (PROTONIX) EC tablet 40 mg  40 mg Oral Daily Agbata, Tochukwu, MD       pravastatin (PRAVACHOL) tablet 10 mg  10 mg Oral QHS Agbata, Tochukwu, MD       predniSONE (DELTASONE) tablet 30 mg  30 mg Oral Q breakfast Agbata, Tochukwu, MD   30 mg at 01/05/22 0945   QUEtiapine (SEROQUEL) tablet 25 mg  25 mg Oral BID Waldon Merl F, NP       sertraline (ZOLOFT) tablet 50 mg  50 mg Oral Daily Agbata, Tochukwu, MD   50 mg at 01/05/22 0946   sodium chloride flush (NS) 0.9 % injection 3 mL  3 mL Intravenous Q12H Agbata, Tochukwu, MD       sodium chloride flush (NS) 0.9 % injection 3 mL  3 mL Intravenous PRN Agbata, Tochukwu, MD       tiZANidine (ZANAFLEX) tablet 4 mg  4 mg Oral Daily PRN Agbata, Tochukwu, MD       zinc sulfate capsule 220 mg  220 mg Oral Daily Agbata, Tochukwu, MD   220 mg at 01/05/22 0945   Current Outpatient Medications  Medication Sig  Dispense Refill   albuterol (VENTOLIN HFA) 108 (90 Base) MCG/ACT inhaler Inhale 1 puff into the lungs every 6 (six) hours as needed for wheezing.     amantadine (SYMMETREL) 100 MG capsule Take 100 mg by mouth 3 (three) times daily.     aspirin EC 81 MG tablet Take 81 mg by mouth daily. Swallow whole.     carbidopa-levodopa (SINEMET CR) 50-200 MG tablet Take 1 tablet by mouth at bedtime.     carbidopa-levodopa (SINEMET IR) 25-100 MG tablet Take 2 tablets by mouth 3 (three) times daily.      docusate sodium (COLACE) 100 MG capsule Take 100 mg by mouth daily.     entacapone (COMTAN) 200 MG tablet Take 200 mg by mouth 3 (three) times daily.     Fluticasone-Umeclidin-Vilant (TRELEGY ELLIPTA) 100-62.5-25 MCG/ACT AEPB Inhale 1 puff into the lungs daily as needed.     levETIRAcetam (KEPPRA) 250 MG tablet Take 1 tablet (250 mg  total) by mouth 2 (two) times daily. (Patient taking differently: Take 250 mg by mouth 2 (two) times daily. Take along with 500 mg for total 750 mg twice daily) 60 tablet 1   levETIRAcetam (KEPPRA) 500 MG tablet Take 500 mg by mouth 2 (two) times daily. Take along with '250mg'$  for total of '750mg'$      memantine (NAMENDA) 5 MG tablet Take 5 mg by mouth daily.     midodrine (PROAMATINE) 10 MG tablet Take 10 mg by mouth 3 (three) times daily with meals.     MYRBETRIQ 50 MG TB24 tablet Take 50 mg by mouth at bedtime.     omeprazole (PRILOSEC) 20 MG capsule Take 1 capsule (20 mg total) by mouth daily. 30 capsule 2   pravastatin (PRAVACHOL) 10 MG tablet Take 10 mg by mouth at bedtime.     predniSONE (DELTASONE) 10 MG tablet Take 3 tablets (30 mg total) by mouth daily with breakfast. 90 tablet 1   sertraline (ZOLOFT) 50 MG tablet Take 50 mg by mouth daily.     tamsulosin (FLOMAX) 0.4 MG CAPS capsule Take 1 capsule (0.4 mg total) by mouth daily after supper. 10 capsule 0   tiZANidine (ZANAFLEX) 4 MG tablet Take 4 mg by mouth daily as needed for muscle spasms.     cyanocobalamin 1000 MCG tablet Take 1 tablet (1,000 mcg total) by mouth daily. (Patient not taking: Reported on 01/05/2022) 30 tablet 0   fluticasone-salmeterol (ADVAIR) 250-50 MCG/ACT AEPB Inhale 1 puff into the lungs as needed.     NAYZILAM 5 MG/0.1ML SOLN Place into both nostrils. (Patient not taking: Reported on 11/06/2021)      Musculoskeletal: Strength & Muscle Tone: decreased Gait & Station:  Did not observe Patient leans: N/A     Psychiatric Specialty Exam:  Presentation  General Appearance: No data recorded Eye Contact:No data recorded Speech:No data recorded Speech Volume:No data recorded Handedness:No data recorded  Mood and Affect  Mood:No data recorded Affect:No data recorded  Thought Process  Thought Processes:No data recorded Descriptions of Associations:No data recorded Orientation:No data recorded Thought  Content:No data recorded History of Schizophrenia/Schizoaffective disorder:No data recorded Duration of Psychotic Symptoms:No data recorded Hallucinations:No data recorded Ideas of Reference:No data recorded Suicidal Thoughts:No data recorded Homicidal Thoughts:No data recorded  Sensorium  Memory:No data recorded Judgment:No data recorded Insight:No data recorded  Executive Functions  Concentration:No data recorded Attention Span:No data recorded Recall:No data recorded Fund of Knowledge:No data recorded Language:No data recorded  Psychomotor Activity  Psychomotor Activity:No data recorded  Assets  Assets:No data recorded  Sleep  Sleep:No data recorded  Physical Exam: Physical Exam Vitals and nursing note reviewed.  Constitutional:      Appearance: He is ill-appearing.  HENT:     Head: Normocephalic.     Nose: No congestion or rhinorrhea.  Eyes:     General:        Right eye: No discharge.        Left eye: No discharge.  Cardiovascular:     Rate and Rhythm: Normal rate.  Pulmonary:     Effort: Pulmonary effort is normal.  Musculoskeletal:     Cervical back: Normal range of motion.     Comments: Parkinson's disease  Neurological:     Mental Status: He is alert.  Psychiatric:        Attention and Perception: He perceives visual (Intermittent) hallucinations.        Mood and Affect: Mood is anxious.        Speech: Speech normal.        Behavior: Behavior is agitated.        Thought Content: Thought content is delusional. Thought content is not paranoid. Thought content does not include homicidal or suicidal ideation.        Cognition and Memory: Cognition is impaired.        Judgment: Judgment is impulsive.    Review of Systems  Constitutional:  Positive for malaise/fatigue.  Psychiatric/Behavioral:  Positive for depression and hallucinations. Negative for substance abuse and suicidal ideas.    Blood pressure 135/80, pulse 89, temperature 97.9 F (36.6  C), temperature source Oral, resp. rate 15, height '5\' 7"'$  (1.702 m), weight 76.8 kg, SpO2 95 %. Body mass index is 26.52 kg/m.  Treatment Plan Summary: Medication management.  Ordered quetiapine 25 mg twice a day and titrate for efficacy.  Reviewed with Dr. Francine Graven via secure chat.  Disposition: Patient does not meet criteria for psychiatric inpatient admission. Supportive therapy provided about ongoing stressors.  Sherlon Handing, NP 01/05/2022 1:15 PM

## 2022-01-05 NOTE — Progress Notes (Signed)
Manufacturing engineer [facility]) Hospital Liaison Note   Received request from PMT Provider/ Alford Highland., NP, for hospice services at home after discharge. Chart and patient information under review by [facility] physician. Hospice eligibility APPROVED.   Spoke with spouse/Donna to initiate education related to hospice philosophy, services, and team approach to care. She verbalized understanding of information given. Per discussion, the plan is for patient to discharge home via TBD once cleared to DC.    DME needs discussed. Patient has the following equipment in the home: Wheelchair  Walker  [facility] Patient requests the following equipment for delivery: Hospital Bed Bedside Table  Address verified and is correct in the chart. [name] is the family member to contact to arrange time of equipment delivery.    Please send signed and completed DNR home with patient/family. Please provide prescriptions at discharge as needed to ensure ongoing symptom management.    [facility] information and contact numbers given to family & above information shared with TOC.   Please call with any questions/concerns.    Thank you for the opportunity to participate in this patient's care.   Daphene Calamity, MSW [facility] Liaison  [phone]

## 2022-01-05 NOTE — Assessment & Plan Note (Signed)
Place patient on seizure precautions Continue Keppra 750 mg twice daily

## 2022-01-05 NOTE — Assessment & Plan Note (Signed)
Secondary to Parkinson's disease Continue Namenda

## 2022-01-05 NOTE — Assessment & Plan Note (Signed)
Stable Continue prednisone 30 mg daily

## 2022-01-05 NOTE — ED Notes (Signed)
Pt calling out for "help!' This nurse responded, upon entering the room pt is lying on the stretcher, when pt sees this nurse he states "Oh, I'm sorry, I forgot where I was." This nurse confirms that the pt is okay, pt reports visual hallucinations. Lights were low in pt room to promote relaxation, small light turned on to help relieve confusion. Pt denies any further needs at this time.

## 2022-01-05 NOTE — Assessment & Plan Note (Signed)
Secondary to known Parkinson's disease Continue midodrine

## 2022-01-06 ENCOUNTER — Observation Stay: Payer: Medicare PPO

## 2022-01-06 ENCOUNTER — Observation Stay
Admit: 2022-01-06 | Discharge: 2022-01-06 | Disposition: A | Discharge status: Home / Self Care | Payer: Medicare PPO | Attending: Internal Medicine | Admitting: Internal Medicine

## 2022-01-06 DIAGNOSIS — R4189 Other symptoms and signs involving cognitive functions and awareness: Secondary | ICD-10-CM | POA: Diagnosis not present

## 2022-01-06 DIAGNOSIS — U071 COVID-19: Secondary | ICD-10-CM

## 2022-01-06 DIAGNOSIS — R441 Visual hallucinations: Secondary | ICD-10-CM | POA: Diagnosis not present

## 2022-01-06 DIAGNOSIS — I639 Cerebral infarction, unspecified: Secondary | ICD-10-CM

## 2022-01-06 LAB — BASIC METABOLIC PANEL
Anion gap: 7 (ref 5–15)
BUN: 28 mg/dL — ABNORMAL HIGH (ref 8–23)
CO2: 24 mmol/L (ref 22–32)
Calcium: 8.8 mg/dL — ABNORMAL LOW (ref 8.9–10.3)
Chloride: 110 mmol/L (ref 98–111)
Creatinine, Ser: 1.43 mg/dL — ABNORMAL HIGH (ref 0.61–1.24)
GFR, Estimated: 54 mL/min — ABNORMAL LOW (ref >60)
Glucose, Bld: 71 mg/dL (ref 70–99)
Potassium: 3.8 mmol/L (ref 3.5–5.1)
Sodium: 141 mmol/L (ref 135–145)

## 2022-01-06 LAB — CBC
HCT: 38.5 % — ABNORMAL LOW (ref 39.0–52.0)
Hemoglobin: 12.6 g/dL — ABNORMAL LOW (ref 13.0–17.0)
MCH: 29.4 pg (ref 26.0–34.0)
MCHC: 32.7 g/dL (ref 30.0–36.0)
MCV: 90 fL (ref 80.0–100.0)
Platelets: 187 10*3/uL (ref 150–400)
RBC: 4.28 MIL/uL (ref 4.22–5.81)
RDW: 14.6 % (ref 11.5–15.5)
WBC: 9.8 10*3/uL (ref 4.0–10.5)
nRBC: 0 % (ref 0.0–0.2)

## 2022-01-06 LAB — ECHOCARDIOGRAM COMPLETE
AR max vel: 3.97 cm2
AV Area VTI: 4.09 cm2
AV Area mean vel: 3.94 cm2
AV Mean grad: 2 mmHg
AV Peak grad: 3.2 mmHg
Ao pk vel: 0.89 m/s
Area-P 1/2: 6.12 cm2
Height: 67 in
S' Lateral: 2.5 cm
Weight: 2709.01 oz

## 2022-01-06 MED ORDER — QUETIAPINE FUMARATE 25 MG PO TABS
50.0000 mg | ORAL_TABLET | Freq: Two times a day (BID) | ORAL | Status: DC
  Administered 2022-01-06: 50 mg via ORAL
  Filled 2022-01-06 (×2): qty 2

## 2022-01-06 MED ORDER — CLOPIDOGREL BISULFATE 75 MG PO TABS
75.0000 mg | ORAL_TABLET | Freq: Every day | ORAL | Status: DC
  Administered 2022-01-06 – 2022-01-07 (×2): 75 mg via ORAL
  Filled 2022-01-06 (×2): qty 1

## 2022-01-06 MED ORDER — QUETIAPINE FUMARATE 25 MG PO TABS: 25.0000 mg | ORAL_TABLET | Freq: Two times a day (BID) | ORAL | Status: DC | PRN

## 2022-01-06 NOTE — Plan of Care (Signed)

## 2022-01-06 NOTE — Progress Notes (Signed)
Vitals entered manually ° °

## 2022-01-06 NOTE — Assessment & Plan Note (Signed)
Can be secondary to recent stroke or Parkinson's.  History of visual hallucinations which recently got worse. Concern of acute metabolic encephalopathy which seems improved as patient is appear to be at his baseline now. -See below for stroke

## 2022-01-06 NOTE — Progress Notes (Signed)
*  PRELIMINARY RESULTS* Echocardiogram 2D Echocardiogram has been performed.  Cory [name] 01/06/2022, 1:13 PM

## 2022-01-06 NOTE — Consult Note (Signed)
NEURO HOSPITALIST CONSULT NOTE   Requestig physician: Dr. Reesa Chew  Reason for Consult: Bilateral small acute occipital lobe strokes on MRI  History obtained from:  Wife, Patient and Chart     HPI:                                                                                                                                          Cory [name] is a 66 y.o. male with a PMHx of Parkinson's disease, CKD3, ED and hypertriglyceridemia who was admitted with concern of visual hallucinations and weakness while recovering from COVID infection contracted approximately 10 days ago. MRI was obtained revealing multiple small infarcts involving different vascular territories. He is planning to go home with outpatient hospice. He has no prior history of stroke.   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    ED (erectile dysfunction)    Eosinophilia    GERD (gastroesophageal reflux disease)    rare -    History of BPH    History of genital warts    History of solitary pulmonary nodule    Left and right lung nodules on CT in ER 1/20; stable by CT 2/21   Hypertriglyceridemia    Microscopic hematuria    Other chronic pain    Parkinson's disease    Pneumonia    age 23   Post-traumatic osteoarthritis of left knee    Rash    legs- on going   Seizure-like activity ([place])    Umbilical hernia     Past Surgical History:  Procedure Laterality Date   APPENDECTOMY     BACK SURGERY     posterior lumbar spine fusion L4-5   COLONOSCOPY     COLONOSCOPY WITH PROPOFOL N/A 06/06/2017   Procedure: COLONOSCOPY WITH PROPOFOL;  Surgeon: Manya Silvas, MD;  Location: [facility]  ENDOSCOPY;  Service: Endoscopy;  Laterality: N/A;   KNEE ARTHROSCOPY Bilateral    ACL   KNEE ARTHROSCOPY W/ ACL RECONSTRUCTION     RADIAL KERATOTOMY     ROTATOR CUFF REPAIR Bilateral    SHOULDER ARTHROSCOPY WITH BICEPS TENDON REPAIR Left 04/16/2019   Procedure: SHOULDER ARTHROSCOPY WITH BICEPS TENDON REPAIR, MINI OPEN SUPERIOR CAPSULAR RECONSTRUCTION, BICEPS TENODESIS;  Surgeon: Leim Fabry, MD;  Location: [facility] ORS;  Service: Orthopedics;  Laterality: Left;    Family History  Problem Relation Age of Onset   Hypertension  Mother    Breast cancer Mother    Lung cancer Father    Breast cancer Sister              Social History:  reports that he has been smoking cigarettes and cigars. He has a 5.75 pack-year smoking history. He has never used smokeless tobacco. He reports that he does not currently use alcohol after a past usage of about 1.0 standard drink of alcohol per week. He reports that he does not use drugs.  No Known Allergies  MEDICATIONS:                                                                                                                     Prior to Admission:  Medications Prior to Admission  Medication Sig Dispense Refill Last Dose   albuterol (VENTOLIN HFA) 108 (90 Base) MCG/ACT inhaler Inhale 1 puff into the lungs every 6 (six) hours as needed for wheezing.   prn at prn   amantadine (SYMMETREL) 100 MG capsule Take 100 mg by mouth 3 (three) times daily.   01/04/2022 at 2130   aspirin EC 81 MG tablet Take 81 mg by mouth daily. Swallow whole.   01/04/2022 at 1230   carbidopa-levodopa (SINEMET CR) 50-200 MG tablet Take 1 tablet by mouth at bedtime.   01/04/2022 at 2130   carbidopa-levodopa (SINEMET IR) 25-100 MG tablet Take 2 tablets by mouth 3 (three) times daily.    01/05/2022 at 0730   docusate sodium (COLACE) 100 MG capsule Take 100 mg by mouth daily.   prn at prn   entacapone (COMTAN) 200 MG tablet Take 200 mg by mouth 3 (three) times daily.   01/04/2022 at 2130    Fluticasone-Umeclidin-Vilant (TRELEGY ELLIPTA) 100-62.5-25 MCG/ACT AEPB Inhale 1 puff into the lungs daily as needed.   prn at prn   levETIRAcetam (KEPPRA) 250 MG tablet Take 1 tablet (250 mg total) by mouth 2 (two) times daily. (Patient taking differently: Take 250 mg by mouth 2 (two) times daily. Take along with 500 mg for total 750 mg twice daily) 60 tablet 1 01/04/2022 at 2000   levETIRAcetam (KEPPRA) 500 MG tablet Take 500 mg by mouth 2 (two) times daily. Take along with '250mg'$  for total of '750mg'$    01/04/2022 at 2130   memantine (NAMENDA) 5 MG tablet Take 5 mg by mouth daily.   01/04/2022 at 1230   midodrine (PROAMATINE) 10 MG tablet Take 10 mg by mouth 3 (three) times daily with meals.   01/04/2022 at 1730   MYRBETRIQ 50 MG TB24 tablet Take 50 mg by mouth at bedtime.   01/04/2022 at 2130   omeprazole (PRILOSEC) 20 MG capsule Take 1 capsule (20 mg total) by mouth daily. 30 capsule 2 01/04/2022   pravastatin (PRAVACHOL) 10 MG tablet Take 10 mg by mouth at bedtime.   01/04/2022 at 2130   predniSONE (DELTASONE) 10 MG tablet Take 3 tablets (30 mg total) by mouth daily with breakfast. 90 tablet 1 01/04/2022 at 0800  sertraline (ZOLOFT) 50 MG tablet Take 50 mg by mouth daily.   01/04/2022 at 0930   tamsulosin (FLOMAX) 0.4 MG CAPS capsule Take 1 capsule (0.4 mg total) by mouth daily after supper. 10 capsule 0 01/04/2022   tiZANidine (ZANAFLEX) 4 MG tablet Take 4 mg by mouth daily as needed for muscle spasms.   prn at prn   cyanocobalamin 1000 MCG tablet Take 1 tablet (1,000 mcg total) by mouth daily. (Patient not taking: Reported on 01/05/2022) 30 tablet 0 Not Taking   fluticasone-salmeterol (ADVAIR) 250-50 MCG/ACT AEPB Inhale 1 puff into the lungs as needed.   prn at prn   NAYZILAM 5 MG/0.1ML SOLN Place into both nostrils. (Patient not taking: Reported on 11/06/2021)      Scheduled:  amantadine  100 mg Oral TID   ascorbic acid  500 mg Oral Daily   aspirin EC  81 mg Oral Daily   carbidopa-levodopa  1 tablet  Oral QHS   carbidopa-levodopa  1 tablet Oral QID   docusate sodium  100 mg Oral Daily   enoxaparin (LOVENOX) injection  40 mg Subcutaneous Q24H   entacapone  200 mg Oral TID   fluticasone furoate-vilanterol  1 puff Inhalation Daily   And   umeclidinium bromide  1 puff Inhalation Daily   levETIRAcetam  250 mg Oral BID   levETIRAcetam  500 mg Oral BID   memantine  5 mg Oral Daily   midodrine  10 mg Oral TID WC   mirabegron ER  50 mg Oral Daily   pantoprazole  40 mg Oral Daily   pravastatin  10 mg Oral QHS   predniSONE  30 mg Oral Q breakfast   QUEtiapine  50 mg Oral BID   sertraline  50 mg Oral Daily   sodium chloride flush  3 mL Intravenous Q12H   zinc sulfate  220 mg Oral Daily   Continuous:  sodium chloride       ROS:                                                                                                                                       As per HPI. He does not endorse any additional complaints.    Blood pressure 119/85, pulse 100, temperature (!) 97.5 F (36.4 C), resp. rate 18, height '5\' 7"'$  (1.702 m), weight 76.8 kg, SpO2 96 %.   General Examination:  Physical Exam  HEENT-  /AT   Lungs- Respirations unlabored Extremities- No edema   Neurological Examination Mental Status: Awake with mildly decreased level of alertness. Speech is fluent but with increased latencies of verbal responses. Naming and comprehension intact. Oriented x 5..Not actively hallucinating.  Cranial Nerves: II: Temporal visual fields intact with no extinction to DSS. PERRL. ,  III,IV, VI: EOMI with saccadic visual pursuits. No nystagmus.  V: Intact to temp bilaterally VII: Hypomimia is noted. Face is symmetric.  VIII: Hearing intact to voice IX,X: Hypophonic speech XI: Head is midline XII: Midline tongue extension Motor: BUE 4+/5 except for deltoid weakness secondary to old  rotator cuff injuries. No asymmetry noted.  BLE 4/5 proximally and distally without asymmetry.  Prominent action tremor to BUE with FNF.  Mild cogwheel rigidity to BUE Sensory: Temp sensation intact throughout, bilaterally Deep Tendon Reflexes: 2+ and symmetric throughout Plantars: Right: downgoing   Left: downgoing Cerebellar: No ataxia with FNF bilaterally. Tremor and bradykinesia is noted.  Gait: Deferred   Lab Results: Basic Metabolic Panel: Recent Labs  Lab 01/05/22 0408 01/06/22 0558  NA 137 141  K 4.0 3.8  CL 108 110  CO2 22 24  GLUCOSE 88 71  BUN 32* 28*  CREATININE 1.78* 1.43*  CALCIUM 8.4* 8.8*    CBC: Recent Labs  Lab 01/05/22 0408 01/06/22 0558  WBC 12.4* 9.8  NEUTROABS 9.0*  --   HGB 13.0 12.6*  HCT 39.5 38.5*  MCV 89.0 90.0  PLT 172 187    Cardiac Enzymes: No results for input(s): "CKTOTAL", "CKMB", "CKMBINDEX", "TROPONINI" in the last 168 hours.  Lipid Panel: No results for input(s): "CHOL", "TRIG", "HDL", "CHOLHDL", "VLDL", "LDLCALC" in the last 168 hours.  Imaging: MR BRAIN WO CONTRAST  Result Date: 01/05/2022 CLINICAL DATA:  Delirium. Additional history provided: History of seizure disorder, Parkinson's disease, coronary artery disease, CKD. EXAM: MRI HEAD WITHOUT CONTRAST TECHNIQUE: Multiplanar, multiecho pulse sequences of the brain and surrounding structures were obtained without intravenous contrast. COMPARISON:  Prior head CT examinations 01/05/2022 and earlier. Brain MRI 08/13/2021. FINDINGS: Intermittently motion degraded examination, limiting evaluation. Most notably, the axial T2 FLAIR sequence is moderately motion degraded and the coronal T2 sequence through the hippocampi is severely motion degraded. Brain: Mild generalized cerebral atrophy. Small acute cortically-based infarct within the right occipital lobe (PCA vascular territory) (for instance as seen on series 5, image 24). Subcentimeter acute cortical infarct within the left occipital  lobe (PCA vascular territory) (series 5, image 27). 11 mm acute/early subacute infarct within the subcortical white matter of the mid-to-posterior left frontal lobe (series 5, image 41) (series 7, image 22). 2 mm acute/early subacute infarct within the left parietal white matter (series 5, image 42). Background moderate multifocal T2 FLAIR hyperintense signal abnormality within the cerebral white matter, nonspecific but compatible with chronic small vessel ischemic disease. Small chronic infarct within the left cerebellar hemisphere, new from the prior MRI of 08/13/2021. Within the limitations of motion degradation, no appreciable hippocampal size or signal asymmetry. No evidence of an intracranial mass. No chronic intracranial blood products. No extra-axial fluid collection. No midline shift. Vascular: Maintained flow voids within the proximal large arterial vessels. Skull and upper cervical spine: No focal suspicious marrow lesion. Sinuses/Orbits: No mass or acute finding within the imaged orbits. No significant paranasal sinus disease. IMPRESSION: 1. Small acute cortically-based infarct within the right occipital lobe (PCA vascular territory). 2. Subcentimeter acute cortical infarct within the left occipital lobe (PCA vascular territory). 3. Two additional  small acute/early subacute infarcts, one within the mid-to-posterior left frontal lobe white matter and the other within the left parietal white matter. 4. Given that the described infarcts involve multiple vascular territories, consider an embolic process. 5. Background moderate chronic small vessel ischemic changes within the cerebral white matter. 6. Small chronic infarct within the left cerebellar hemisphere, new from the prior MRI of 08/13/2021. 7. Mild generalized cerebral atrophy. Electronically Signed   By: Kellie Simmering D.O.   On: 01/05/2022 11:35   CT HEAD WO CONTRAST (5MM)  Result Date: 01/05/2022 CLINICAL DATA:  Seizure-like activity. EXAM: CT  HEAD WITHOUT CONTRAST TECHNIQUE: Contiguous axial images were obtained from the base of the skull through the vertex without intravenous contrast. RADIATION DOSE REDUCTION: This exam was performed according to the departmental dose-optimization program which includes automated exposure control, adjustment of the mA and/or kV according to patient size and/or use of iterative reconstruction technique. COMPARISON:  07/07/2018.  Brain MRI 08/13/2021 FINDINGS: Brain: Small area of cortical and white matter low-density in the right occipital lobe. No hemorrhage, hydrocephalus, extra-axial collection or mass lesion/mass effect. Small remote left cerebellar infarct which has occurred in the interim. Mild cerebral volume loss. Vascular: No hyperdense vessel or unexpected calcification. Skull: Normal. Negative for fracture or focal lesion. Sinuses/Orbits: No acute finding. IMPRESSION: 1. Small right occipital cortex infarct which is age indeterminate, new since 08/13/2021. 2. Small chronic left cerebellar infarct which is also new from 08/13/2021. Electronically Signed   By: Jorje Guild M.D.   On: 01/05/2022 05:10   DG Chest Port 1 View  Result Date: 01/05/2022 CLINICAL DATA:  Possible seizure EXAM: PORTABLE CHEST 1 VIEW COMPARISON:  02/13/2022 radiograph and chest CT 12/04/2021. FINDINGS: Normal heart size and mediastinal contours. Coarsening at the bases correlating with interstitial opacity on chest CT 12/04/2021. No acute infiltrate or edema. No effusion or pneumothorax. No acute osseous findings. IMPRESSION: No acute finding when compared to prior. Electronically Signed   By: Jorje Guild M.D.   On: 01/05/2022 04:33     Assessment: 66 y.o. male with a PMHx of Parkinson's disease, CKD3, ED and hypertriglyceridemia who was admitted with concern of visual hallucinations and weakness while recovering from COVID infection contracted approximately 10 days ago. MRI was obtained revealing multiple small infarcts  involving different vascular territories. He is planning to go home with outpatient hospice. He has no prior history of stroke.  - Exam reveals hypomimia, decreased blink rate, hypophonia, action tremor, cogwheel rigidity and bradykinesia, all consistent with his diagnosis of Parkinson's disease.  - MRI brain: Small acute cortically-based infarct within the right occipital lobe (PCA vascular territory). Subcentimeter acute cortical infarct within the left occipital lobe (PCA vascular territory). Two additional small acute/early subacute infarcts, one within the mid-to-posterior left frontal lobe white matter and the other within the left parietal white matter. Given that the described infarcts involve multiple vascular territories, consider an embolic process. Background moderate chronic small vessel ischemic changes within the cerebral white matter. Small chronic infarct within the left cerebellar hemisphere, new from the prior MRI of 08/13/2021. Mild generalized cerebral atrophy. - TTE: Left ventricular ejection fraction, by estimation, is 60 to 65%. The left ventricle has normal function. The left ventricle has no regional wall motion abnormalities. Left ventricular diastolic parameters were normal. Right ventricular systolic function is normal. The right ventricular size is normal. The mitral valve is normal in structure. Mild mitral valve regurgitation. No evidence of mitral stenosis. The aortic valve is normal in structure. Aortic  valve regurgitation is not visualized. No aortic stenosis is present. The inferior vena cava is normal in size with greater than 50% respiratory variability, suggesting right atrial pressure of 3 mmHg.  - Carotid ultrasound: Report pending - EKG: Sinus rhythm - Complaint of formed visual hallucinations, including one of a small boy while he was taking a shower. The type of formed hallucination described, of a small person, is typical for Parkinson's with dementia: Recent  illness may have increased the likelihood of hallucinations.  - Weakness resulting in decompensation regarding his ambulation: Most likely secondary to intercurrent illness.  - History of seizures. On Keppra 750 mg BID at home.  - MRI images were reviewed with patient and family. All questions answered.     Recommendations: - Continue his home dose of Keppra - Continue ASA and add Plavix to regimen as he has failed ASA monotherapy.  - On pravastatin - MRA of head - Cardiac telemetry - BP management - HgbA1c, fasting lipid panel - PT consult, OT consult, Speech consult  Addendum: - Carotid ultrasound: Color duplex indicates minimal heterogeneous and calcified plaque, with no hemodynamically significant stenosis by duplex criteria in the extracranial cerebrovascular circulation. - Would discuss the option/indications for a TEE with Cardiology given TTE with normal EF and no evidence for mural thrombus or valvular vegetation, but with MRI brain revealing stroke distribution suggestive of a central embolic source - If cardiac telemetry does not reveal a-fib, will likely need a Zio patch  Electronically signed: Dr. Kerney Elbe 01/06/2022, 9:19 AM

## 2022-01-06 NOTE — Hospital Course (Addendum)
Taken from H&P. Cory [name] is a 66 y.o. male with medical history significant for Parkinson's disease, stage IIIb chronic kidney disease, cognitive impairment related to known Parkinson's disease, history of BPH, seizure disorder, COPD, recently diagnosed with COVID-19 viral infection 8 days prior to this hospitalization who was brought into the ER by EMS for evaluation of generalized weakness, frequent falls, visual hallucinations and seizure-like activity the night prior to his admission. Patient's wife notes that they both have COVID-19 viral infection which was diagnosed 8 days ago.  Patient's symptoms were mostly cough which is not productive of yellow phlegm and weakness.  At baseline he is usually able to get around with a rolling walker but has not been able to do that for the last several days due to generalized weakness.  ED course.  Hemodynamically stable.  Labs mostly unremarkable. Chest x-ray with no acute abnormality. CT head was concerning for few posterior infarcts of undetermined age.  Which was followed by MRI brain which shows a small acute infarct within the right occipital lobe, PCA vascular territory. There were subcentimeter acute cortical infarcts within the left occipital lobe.  And 2 additional small acute/subacute infarct involving left parietal lobe and left frontal lobe.  Due to the involvement of multiple vascular territories, most likely an embolic process. Neurology was consulted. Echocardiogram within normal limits.  No acute abnormality. Carotid ultrasound ordered. 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  Holding 1 dose. Neurology added Plavix with his aspirin.  Patient is being discharged with aspirin, Plavix and Seroquel as advised by psychiatry as new medications. However PT and OT are recommending SNF,  patient and wife decided to go home with hospice services. Equipment including hospital bed has already been delivered. Patient appears to be at his baseline.  Patient will be high risk for deteriorating and mortality based on underlying comorbidities.  Patient will continue on current medications and need to have a close follow-up with his providers for further recommendations.

## 2022-01-06 NOTE — Evaluation (Signed)
Occupational Therapy Evaluation Patient Details Name: Cory [name] MRN: [id number] DOB: 03/18/1956 Today's Date: 01/06/2022   History of Present Illness Patient is a 66 year old male with past medical history of Parkinson's disease, CKD 3, cognitive impairment RT Parkinson's, history of BPH, seizure disorder, COPD, recent diagnosis with COVID, rotator cuff repair.Patient admitted with frequent falls, orthostatic hypotension, acute encephalopathy.MRI of brain reported small acute cortically-based infarct within the right occipital lobe, subcentimeter acute cortical infarct within the left occipital lobe, two additional small acute/early subacute infarcts in left frontal lobe and left parietal white matter   Clinical Impression   [name] was seen for OT evaluation this date. Prior to hospital admission, pt required assistance with transfers and LB dressing, but states he tries to do as much as he can independently. Pt lives with his spouse who is present at bedside t/o session. Spouse reports pt cognition/functional independence are far from baseline. He is oriented to self only and requires increased cueing to follow multi-step VCs t/o session. Pt requires MOD-MAX A +2 for bed mobility and MOD-MAX A +1 to maintain seated balance while performing functional tasks. He is functionally limited by deficits listed below (See OT problem list). Pt would benefit from skilled OT services to address noted impairments and functional limitations (see below for any additional details) in order to maximize safety and independence while minimizing falls risk and caregiver burden. Upon hospital discharge, recommend STR to maximize pt safety and return to PLOF.        Recommendations for follow up therapy are one component of a multi-disciplinary discharge planning process, led by the attending physician.  Recommendations may be updated based on patient status, additional functional criteria and insurance  authorization.   Follow Up Recommendations  Skilled nursing-short term rehab (<3 hours/day)    Assistance Recommended at Discharge Frequent or constant Supervision/Assistance  Patient can return home with the following Two people to help with bathing/dressing/bathroom;Two people to help with walking and/or transfers;Assistance with cooking/housework;Assistance with feeding;Help with stairs or ramp for entrance;Assist for transportation;Direct supervision/assist for medications management;Direct supervision/assist for financial management    Functional Status Assessment  Patient has had a recent decline in their functional status and demonstrates the ability to make significant improvements in function in a reasonable and predictable amount of time.  Equipment Recommendations  Hospital bed    Recommendations for Other Services       Precautions / Restrictions Precautions Precautions: Fall Restrictions Weight Bearing Restrictions: No      Mobility Bed Mobility Overal bed mobility: Needs Assistance Bed Mobility: Supine to Sit, Sit to Supine     Supine to sit: Mod assist, +2 for physical assistance Sit to supine: Max assist, +2 for physical assistance   General bed mobility comments: assistance for trunk and BLE support. verbal cues for sequencing and technique. increased time and effort required    Transfers                   General transfer comment: unable to sit long enough to safely attempt standing. limited activity tolerance and fatigue/dizziness reported with activity      Balance Overall balance assessment: Needs assistance, History of Falls Sitting-balance support: Single extremity supported Sitting balance-Leahy Scale: Poor Sitting balance - Comments: moderate assistance required to maintain midline with worsening R side lean with dynamic activity  ADL either performed or assessed with clinical judgement    ADL Overall ADL's : Needs assistance/impaired                                       General ADL Comments: Significantly funcitonally limited 2/2 generalized weakness, L sided weakness, impaired balance, and impaired cognition. He requires MOD A +2 for bed mobility, MOD A to maintain static sitting balance at EOB progressing to MAX A when functional task incorporated while sitting upright.     Vision         Perception     Praxis      Pertinent Vitals/Pain Pain Assessment Pain Assessment: No/denies pain     Hand Dominance Right   Extremity/Trunk Assessment Upper Extremity Assessment Upper Extremity Assessment: Generalized weakness;LUE deficits/detail LUE Deficits / Details: Difficult to assess 2/2 impaired cognition however LUE notably weaker with decreased active functional use, decreased grip strength, and decreased AROM.   Lower Extremity Assessment Lower Extremity Assessment: Generalized weakness       Communication Communication Communication: No difficulties   Cognition Arousal/Alertness: Awake/alert Behavior During Therapy: Flat affect Overall Cognitive Status: Impaired/Different from baseline Area of Impairment: Orientation, Memory, Following commands, Problem solving                 Orientation Level: Disoriented to, Place, Time, Situation   Memory: Decreased short-term memory Following Commands: Follows one step commands with increased time     Problem Solving: Slow processing, Decreased initiation, Difficulty sequencing, Requires verbal cues, Requires tactile cues       General Comments       Exercises Other Exercises Other Exercises: Pt/spouse educated on role of OT in acute setting, positioning strategies for maximizing L sided involvement in functional activity, bed mobility techniques, and routines modifications to support safety and functional independence during ADL management.   Shoulder Instructions      Home  Living Family/patient expects to be discharged to:: Private residence Living Arrangements: Spouse/significant other Available Help at Discharge: Family;Available 24 hours/day Type of Home: House             Bathroom Shower/Tub: Walk-in shower         Home Equipment: Rollator (4 wheels);Wheelchair - manual;Grab bars - tub/shower;Hand held shower head;BSC/3in1;Shower seat - built in   Additional Comments: spouse reports they are getting a hospital bed at home      Prior Functioning/Environment Prior Level of Function : Needs assist;History of Falls (last six months)       Physical Assist : ADLs (physical)   ADLs (physical): Dressing;IADLs Mobility Comments: recent increase in falls at home with increased assistance required with mobility. usually ambualtory with or without the walker ADLs Comments: spouse assists with dressing. patient is able to bathe himself while seated on the shower chair.        OT Problem List: Decreased strength;Decreased coordination;Decreased activity tolerance;Decreased safety awareness;Impaired balance (sitting and/or standing);Decreased knowledge of use of DME or AE;Decreased cognition;Decreased range of motion;Impaired vision/perception      OT Treatment/Interventions: Self-care/ADL training;Therapeutic exercise;Therapeutic activities;DME and/or AE instruction;Patient/family education;Balance training;Energy conservation    OT Goals(Current goals can be found in the care plan section) Acute Rehab OT Goals Patient Stated Goal: To go home OT Goal Formulation: With patient Time For Goal Achievement: 01/20/22 Potential to Achieve Goals: Good  OT Frequency: Min 3X/week    Co-evaluation  AM-PAC OT "6 Clicks" Daily Activity     Outcome Measure Help from another person eating meals?: A Lot Help from another person taking care of personal grooming?: A Lot Help from another person toileting, which includes using toliet, bedpan, or  urinal?: A Lot Help from another person bathing (including washing, rinsing, drying)?: A Lot Help from another person to put on and taking off regular upper body clothing?: A Lot Help from another person to put on and taking off regular lower body clothing?: A Lot 6 Click Score: 12   End of Session Equipment Utilized During Treatment: Gait belt;Rolling walker (2 wheels)  Activity Tolerance: Patient tolerated treatment well Patient left: in bed;with call bell/phone within reach;with bed alarm set;with family/visitor present  OT Visit Diagnosis: Other abnormalities of gait and mobility (R26.89);Hemiplegia and hemiparesis Hemiplegia - Right/Left: Left Hemiplegia - dominant/non-dominant: Non-Dominant Hemiplegia - caused by: Cerebral infarction                Time: 4562-5638 OT Time Calculation (min): 36 min Charges:  OT General Charges $OT Visit: 1 Visit OT Evaluation $OT Eval Moderate Complexity: 1 Mod OT Treatments $Self Care/Home Management : 8-22 mins  Shara Blazing, M.S., OTR/L 01/06/22, 4:01 PM

## 2022-01-06 NOTE — Progress Notes (Signed)
Progress Note   Patient: Cory [name] UMP:[id number] DOB: 01/07/1956 DOA: 01/05/2022     0 DOS: the patient was seen and examined on 01/06/2022   Brief hospital course: Taken from H&P. HORRIS SPEROS is a 66 y.o. male with medical history significant for Parkinson's disease, stage IIIb chronic kidney disease, cognitive impairment related to known Parkinson's disease, history of BPH, seizure disorder, COPD, recently diagnosed with COVID-19 viral infection 8 days prior to this hospitalization who was brought into the ER by EMS for evaluation of generalized weakness, frequent falls, visual hallucinations and seizure-like activity the night prior to his admission. Patient's wife notes that they both have COVID-19 viral infection which was diagnosed 8 days ago.  Patient's symptoms were mostly cough which is not productive of yellow phlegm and weakness.  At baseline he is usually able to get around with a rolling walker but has not been able to do that for the last several days due to generalized weakness.  ED course.  Hemodynamically stable.  Labs mostly unremarkable. Chest x-ray with no acute abnormality. CT head was concerning for few posterior infarcts of undetermined age.  Which was followed by MRI brain which shows a small acute infarct within the right occipital lobe, PCA vascular territory. There were subcentimeter acute cortical infarcts within the left occipital lobe.  And 2 additional small acute/subacute infarct involving left parietal lobe and left frontal lobe.  Due to the involvement of multiple vascular territories, most likely an embolic process. Neurology was consulted. Echocardiogram within normal limits.  No acute abnormality. Carotid ultrasound ordered. Lipid profile and A1c to complete the work-up    Assessment and Plan: * Hallucination, visual Can be secondary to recent stroke or Parkinson's.  History of visual hallucinations which recently got worse. Concern of acute  metabolic encephalopathy which seems improved as patient is appear to be at his baseline now. -See below for stroke  Stroke [facility]) MRI with concern of multiple small acute infarcts involving different vascular territories, concern of embolic phenomenon.  No prior history of A-fib. Echocardiogram with no cute abnormality or thrombus. Recent COVID infection. PT/OT recommending SNF-wife would like to take him back home with hospice help. -Neurology was consulted. -Lipid profile and A1c to complete the work-up -Carotid ultrasound -Might need ZIO monitor on discharge  Acute encephalopathy Improved.  Appears to be at baseline now. Patient with worsening confusion and visual hallucinations over the last several days which may be secondary to delirium related to COVID-19 viral illness. 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  Frequent falls Patient with a history of Parkinson's disease who presents for evaluation of frequent falls which may be secondary to orthostatic hypotension related to his Parkinson's disease versus from generalized weakness from acute COVID-19 viral infection. Fall precautions PT evaluation  Orthostatic hypotension Secondary to known Parkinson's disease Continue midodrine  Parkinson's disease Continue Sinemet and entacapone   Seizure ([place]) Place patient on seizure precautions Continue Keppra 750 mg twice daily  Hypereosinophilic syndrome (hes), unspecified Stable Continue prednisone 30 mg daily  Depression Continue sertraline  Stage 3a chronic kidney disease (CKD) (HCC) Renal function is stable  GERD (gastroesophageal reflux disease) Continue PPI  Generalized weakness Patient presents to the ER for evaluation of generalized weakness and frequent falls following recent diagnosis of COVID-19 viral illness. Place patient on fall precautions PT evaluation  COVID-19 virus infection Patient was diagnosed with COVID-19 viral infection 8  days prior to this hospitalization and completed a course of oral antiviral agent. Continue  supportive care with bronchodilator therapy, antitussives, vitamins Continue airborne and contact precautions  Cognitive impairment Secondary to Parkinson's disease Continue Namenda  COPD (chronic obstructive pulmonary disease) ([place]) Continue as needed bronchodilator therapy as well as inhaled steroids   Subjective: Patient was seen and examined today.  Continued to have some hallucinations which seems chronic.  Continue to feel weak.  Wife at bedside.  Physical Exam: Vitals:   01/05/22 2256 01/06/22 0031 01/06/22 0858 01/06/22 1711  BP: (!) 157/92 (!) 142/85 119/85 134/87  Pulse: 79 83 100 95  Resp: '16 18  18  '$ Temp: 98.3 F (36.8 C) 98.2 F (36.8 C) (!) 97.5 F (36.4 C) 97.8 F (36.6 C)  TempSrc:  Oral  Oral  SpO2: 98% 95% 96% 96%  Weight:      Height:       General.  Ill-appearing gentleman, in no acute distress. Pulmonary.  Lungs clear bilaterally, normal respiratory effort. CV.  Regular rate and rhythm, no JVD, rub or murmur. Abdomen.  Soft, nontender, nondistended, BS positive. CNS.  Alert and oriented .  No focal neurologic deficit. Extremities.  No edema, no cyanosis, pulses intact and symmetrical. Psychiatry.  Judgment and insight appears at baseline.  Data Reviewed: Prior data reviewed  Family Communication: Discussed with wife at bedside  Disposition: Status is: Observation The patient will require care spanning > 2 midnights and should be moved to inpatient because: Severity of illness  Planned Discharge Destination: Home with Home Health  DVT prophylaxis.  Lovenox Time spent: 50 minutes  This record has been created using Systems analyst. Errors have been sought and corrected,but may not always be located. Such creation errors do not reflect on the standard of care.  Author: Lorella Nimrod, MD 01/06/2022 5:21 PM  For on call review  [email or web address].

## 2022-01-06 NOTE — Assessment & Plan Note (Signed)
MRI with concern of multiple small acute infarcts involving different vascular territories, concern of embolic phenomenon.  No prior history of A-fib. Echocardiogram with no cute abnormality or thrombus. Recent COVID infection. PT/OT recommending SNF-wife would like to take him back home with hospice help. -Neurology was consulted. -Lipid profile and A1c to complete the work-up -Carotid ultrasound -Might need ZIO monitor on discharge

## 2022-01-06 NOTE — Progress Notes (Signed)
[facility]) Hospital Liaison Note   HLT will continue to follow through hospitalization as patient continues to receive medical treatment.   Once medically clear, please send signed and completed DNR home with patient/family. Please provide prescriptions at discharge as needed to ensure ongoing symptom management.    [facility] information and contact numbers given to family & above information shared with TOC.   Please call with any questions/concerns.    Thank you for the opportunity to participate in this patient's care.   Daphene Calamity, MSW [facility] Liaison  [phone]

## 2022-01-06 NOTE — Consult Note (Signed)
  Follow-up for medication effectiveness. Patient is getting incontinence care from staff at time of my face-to-face visit. He is ill-appearing. I spoke with patient's wife who is at bedside. She states that the Seroquel helped with the hallucinations, but only for a short time. We will increase to 50 mg BID and add Seroquel 25 mg as needed twice daily for hallucinations/agitation.  Reviewed with Dr. Weber Cooks. May switch to Zyprexa if Seroquel not providing enough effectiveness.   Sherlon Handing, PMHNP-BC

## 2022-01-06 NOTE — Evaluation (Signed)
Physical Therapy Evaluation Patient Details Name: Cory [name] MRN: [id number] DOB: 1956/02/23 Today's Date: 01/06/2022  History of Present Illness  Patient is a 66 year old male with past medical history of Parkinson's disease, CKD 3, cognitive impairment RT Parkinson's, history of BPH, seizure disorder, COPD, recent diagnosis with COVID, rotator cuff repair.Patient admitted with frequent falls, orthostatic hypotension, acute encephalopathy.MRI of brain reported small acute cortically-based infarct within the right occipital lobe, subcentimeter acute cortical infarct within the left occipital lobe, two additional small acute/early subacute infarcts in left frontal lobe and left parietal white matter   Clinical Impression  Patient seen for PT evaluation with spouse present in room. The patient has some confusion but is able to follow single step commands with increased time. The spouse reports patient has had increasing difficulty with mobility since COVID diagnosis. He can typically ambulate with or without rolling walker.  The patient required more assistance than normal with bed mobility. Poor sitting balance with right side lean despite cues for midline. Limited sitting tolerance of less than 3 minutes, as patient requesting to return to bed due to fatigue and dizziness.  The spouse is hopeful for discharge home with a hospital bed and home health PT and OT. Recommend PT follow up while in the hospital to maximize independence and decrease caregiver burden.      Recommendations for follow up therapy are one component of a multi-disciplinary discharge planning process, led by the attending physician.  Recommendations may be updated based on patient status, additional functional criteria and insurance authorization.  Follow Up Recommendations Home health PT      Assistance Recommended at Discharge Frequent or constant Supervision/Assistance  Patient can return home with the following  A  lot of help with walking and/or transfers;A lot of help with bathing/dressing/bathroom;Assistance with cooking/housework;Direct supervision/assist for medications management;Assist for transportation    Equipment Recommendations Hospital bed  Recommendations for Other Services  OT consult    Functional Status Assessment Patient has had a recent decline in their functional status and demonstrates the ability to make significant improvements in function in a reasonable and predictable amount of time.     Precautions / Restrictions Precautions Precautions: Fall Restrictions Weight Bearing Restrictions: No      Mobility  Bed Mobility Overal bed mobility: Needs Assistance Bed Mobility: Supine to Sit, Sit to Supine, Rolling Rolling: Min assist   Supine to sit: Max assist Sit to supine: Max assist   General bed mobility comments: assistance frot runk and BLE support. verbal cues for sequending and technique. increased time and effort required    Transfers                   General transfer comment: unable to sit long enough to safely attempt standing. limited activity tolerance and fatigue/dizziness reported with activity    Ambulation/Gait                  Stairs            Wheelchair Mobility    Modified Rankin (Stroke Patients Only)       Balance Overall balance assessment: Needs assistance, History of Falls Sitting-balance support: Single extremity supported Sitting balance-Leahy Scale: Poor Sitting balance - Comments: moderate assistance required to maintain midline with worsening R side lean with dynamic activity Postural control: Right lateral lean  Pertinent Vitals/Pain Pain Assessment Pain Assessment: No/denies pain    Home Living Family/patient expects to be discharged to:: Private residence Living Arrangements: Spouse/significant other Available Help at Discharge: Family;Available 24  hours/day Type of Home: House           Home Equipment: Rollator (4 wheels);Shower seat;Wheelchair - manual (U-step walker) Additional Comments: spouse reports they are getting a hospital bed at home    Prior Function Prior Level of Function : Needs assist;History of Falls (last six months)             Mobility Comments: recent increase in falls at home with increased assistance required with mobility. usually ambualtory with or without the walker ADLs Comments: spouse assists with dressing. patient is able to bath himself while seated on the shower chair     Hand Dominance   Dominant Hand: Right    Extremity/Trunk Assessment   Upper Extremity Assessment Upper Extremity Assessment: Generalized weakness (spouse reports patient was unable to feed himself this morning. difficulty with following commands for formal MMT. endurance impaired for sustianed activity.)    Lower Extremity Assessment Lower Extremity Assessment: Generalized weakness (patient able to SLR bilaterally and hold for 2 seconds, dorsiflexion and plantarflexion 4+/5)       Communication   Communication: No difficulties  Cognition Arousal/Alertness: Awake/alert Behavior During Therapy: Flat affect Overall Cognitive Status: Impaired/Different from baseline Area of Impairment: Orientation, Memory, Following commands, Problem solving                 Orientation Level: Disoriented to, Situation, Place, Time   Memory: Decreased short-term memory Following Commands: Follows one step commands with increased time (and repetition)     Problem Solving: Slow processing, Decreased initiation, Difficulty sequencing, Requires verbal cues, Requires tactile cues          General Comments      Exercises     Assessment/Plan    PT Assessment Patient needs continued PT services  PT Problem List Decreased strength;Decreased range of motion;Decreased activity tolerance;Decreased mobility;Decreased  balance;Decreased coordination;Decreased cognition;Decreased knowledge of use of DME;Decreased safety awareness       PT Treatment Interventions DME instruction;Gait training;Therapeutic activities;Functional mobility training;Balance training;Therapeutic exercise;Neuromuscular re-education;Cognitive remediation;Patient/family education;Wheelchair mobility training    PT Goals (Current goals can be found in the Care Plan section)  Acute Rehab PT Goals Patient Stated Goal: to go home with home health services PT Goal Formulation: With family Time For Goal Achievement: 01/20/22 Potential to Achieve Goals: Fair    Frequency 7X/week     Co-evaluation               AM-PAC PT "6 Clicks" Mobility  Outcome Measure Help needed turning from your back to your side while in a flat bed without using bedrails?: A Lot Help needed moving from lying on your back to sitting on the side of a flat bed without using bedrails?: A Lot Help needed moving to and from a bed to a chair (including a wheelchair)?: A Lot Help needed standing up from a chair using your arms (e.g., wheelchair or bedside chair)?: A Lot Help needed to walk in hospital room?: A Lot Help needed climbing 3-5 steps with a railing? : Total 6 Click Score: 11    End of Session   Activity Tolerance: Patient tolerated treatment well Patient left: in bed;with call bell/phone within reach;with bed alarm set;with family/visitor present;with nursing/sitter in room (spouse and nurse present) Nurse Communication: Mobility status PT Visit Diagnosis: Unsteadiness on feet (R26.81);Muscle  weakness (generalized) (M62.81);Other symptoms and signs involving the nervous system (R29.898)    Time: 7998-7215 PT Time Calculation (min) (ACUTE ONLY): 39 min   Charges:   PT Evaluation $PT Eval Moderate Complexity: 1 Mod PT Treatments $Therapeutic Activity: 8-22 mins       Minna Merritts, PT, MPT  Percell Locus 01/06/2022, 10:38 AM

## 2022-01-06 NOTE — Progress Notes (Signed)
Palliative:  Chart review completed.  Mr. and Mrs. Keizer are working closely with Manufacturing engineer [facility]) for "treat the treatable" at home hospice care once he is medically optimized.  [facility] is working closely with patient and spouse for equipment and needs.  DNR/goldenrod form completed and placed on chart.  Face-to-face conference with transition of care team.  Conference with attending, transition of care team, bedside nursing staff, [facility] representative related to patient condition, needs.  Plan: Continue to treat but no CPR or intubation.  Goal is medical optimization prior to discharging home with [facility] for "treat the treatable" hospice care.  No charge Quinn Axe, NP Palliative medicine team Team phone [phone] Greater than 50% of this time was spent counseling and coordinating care related to the above assessment and plan.

## 2022-01-07 ENCOUNTER — Observation Stay: Payer: Medicare PPO

## 2022-01-07 DIAGNOSIS — Z515 Encounter for palliative care: Secondary | ICD-10-CM | POA: Diagnosis not present

## 2022-01-07 DIAGNOSIS — Z7189 Other specified counseling: Secondary | ICD-10-CM | POA: Diagnosis not present

## 2022-01-07 DIAGNOSIS — R441 Visual hallucinations: Secondary | ICD-10-CM | POA: Diagnosis not present

## 2022-01-07 LAB — HEMOGLOBIN A1C
Hgb A1c MFr Bld: 5.7 % — ABNORMAL HIGH (ref 4.8–5.6)
Mean Plasma Glucose: 116.89 mg/dL

## 2022-01-07 LAB — LIPID PANEL
Cholesterol: 136 mg/dL (ref 0–200)
HDL: 39 mg/dL — ABNORMAL LOW (ref >40)
LDL Cholesterol: 72 mg/dL (ref 0–99)
Total CHOL/HDL Ratio: 3.5 RATIO
Triglycerides: 124 mg/dL (ref <150)
VLDL: 25 mg/dL (ref 0–40)

## 2022-01-07 MED ORDER — AZITHROMYCIN 250 MG PO TABS: 250.0000 mg | ORAL_TABLET | Freq: Every day | ORAL | Status: DC

## 2022-01-07 MED ORDER — QUETIAPINE FUMARATE 50 MG PO TABS: 50.0000 mg | ORAL_TABLET | Freq: Two times a day (BID) | ORAL | 0 refills | Status: AC

## 2022-01-07 MED ORDER — ZINC SULFATE 220 (50 ZN) MG PO CAPS: 220.0000 mg | ORAL_CAPSULE | Freq: Every day | ORAL | 0 refills | Status: AC

## 2022-01-07 MED ORDER — ASCORBIC ACID 500 MG PO TABS: 500.0000 mg | ORAL_TABLET | Freq: Every day | ORAL | 0 refills | Status: AC

## 2022-01-07 MED ORDER — AZITHROMYCIN 500 MG PO TABS
500.0000 mg | ORAL_TABLET | Freq: Every day | ORAL | Status: AC
  Administered 2022-01-07: 500 mg via ORAL
  Filled 2022-01-07: qty 1

## 2022-01-07 MED ORDER — CLOPIDOGREL BISULFATE 75 MG PO TABS: 75.0000 mg | ORAL_TABLET | Freq: Every day | ORAL | 1 refills | Status: AC

## 2022-01-07 MED ORDER — AZITHROMYCIN 250 MG PO TABS: ORAL_TABLET | ORAL | 0 refills | Status: AC

## 2022-01-07 MED ORDER — GUAIFENESIN 100 MG/5ML PO LIQD: 5.0000 mL | ORAL | 0 refills | Status: AC | PRN

## 2022-01-07 NOTE — Consult Note (Signed)
Writer saw patient face-to-face. Patient is alert. He denies hallucinations today. Patient and his wife, who is a bedside, report that the Seroquel 50 mg last night decreased agitation and hallucinations. He has not had any hallucinations today. Patient declined Seroquel this morning due to sleepiness and not having any hallucinations at all. Patient will be discharging today. Discussed adjusting Seroquel for symptoms and somnolence to 25-50 mg as needed for hallucinations.   Sherlon Handing, PMHNP

## 2022-01-07 NOTE — Discharge Summary (Signed)
Physician Discharge Summary   Patient: Cory [name] MRN: [id number] DOB: 30-Mar-1956  Admit date:     01/05/2022  Discharge date: 01/07/22  Discharge Physician: Lorella Nimrod   PCP: Adin Hector, MD   Recommendations at discharge:  Please ensure that his blood pressure is optimal and not elevated on midodrine. Follow up with primary care provider within a week. Follow-up with his outpatient neurologist in 1 to 2 weeks.  Discharge Diagnoses: Principal Problem:   Hallucination, visual Active Problems:   Acute encephalopathy   Stroke (HCC)   Frequent falls   Orthostatic hypotension   Parkinson's disease   Seizure (HCC)   Hypereosinophilic syndrome (hes), unspecified   Depression   Stage 3a chronic kidney disease (CKD) (HCC)   GERD (gastroesophageal reflux disease)   Generalized weakness   COVID-19 virus infection   Cognitive impairment   COPD (chronic obstructive pulmonary disease) [facility])   Hospital Course: Taken from H&P. Cory [name] is a 66 y.o. male with medical history significant for Parkinson's disease, stage IIIb chronic kidney disease, cognitive impairment related to known Parkinson's disease, history of BPH, seizure disorder, COPD, recently diagnosed with COVID-19 viral infection 8 days prior to this hospitalization who was brought into the ER by EMS for evaluation of generalized weakness, frequent falls, visual hallucinations and seizure-like activity the night prior to his admission. Patient's wife notes that they both have COVID-19 viral infection which was diagnosed 8 days ago.  Patient's symptoms were mostly cough which is not productive of yellow phlegm and weakness.  At baseline he is usually able to get around with a rolling walker but has not been able to do that for the last several days due to generalized weakness.  ED course.  Hemodynamically stable.  Labs mostly unremarkable. Chest x-ray with no acute abnormality. CT head was concerning for few  posterior infarcts of undetermined age.  Which was followed by MRI brain which shows a small acute infarct within the right occipital lobe, PCA vascular territory. There were subcentimeter acute cortical infarcts within the left occipital lobe.  And 2 additional small acute/subacute infarct involving left parietal lobe and left frontal lobe.  Due to the involvement of multiple vascular territories, most likely an embolic process. Neurology was consulted. Echocardiogram within normal limits.  No acute abnormality. Carotid ultrasound ordered. 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  Holding 1 dose. Neurology added Plavix with his aspirin.  Patient is being discharged with aspirin, Plavix and Seroquel as advised by psychiatry as new medications. However PT and OT are recommending SNF, patient and wife decided to go home with hospice services. Equipment including hospital bed has already been delivered. Patient appears to be at his baseline.  Patient will be high risk for deteriorating and mortality based on underlying comorbidities.  Patient will continue on current medications and need to have a close follow-up with his providers for further recommendations.  Assessment and Plan: * Hallucination, visual Can be secondary to recent stroke or Parkinson's.  History of visual hallucinations which recently got worse. Concern of acute metabolic encephalopathy which seems improved as patient is appear to be at his baseline now. -See below for stroke  Stroke [facility]) MRI with concern of multiple small acute infarcts involving different vascular territories, concern of embolic phenomenon.  No prior history of A-fib. Echocardiogram with no cute abnormality  or thrombus. Recent COVID infection. PT/OT recommending SNF-wife would like to take him back home  with hospice help. -Neurology was consulted. -Lipid profile and A1c to complete the work-up -Carotid ultrasound -Might need ZIO monitor on discharge  Acute encephalopathy Improved.  Appears to be at baseline now. Patient with worsening confusion and visual hallucinations over the last several days which may be secondary to delirium related to COVID-19 viral illness. 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  Frequent falls Patient with a history of Parkinson's disease who presents for evaluation of frequent falls which may be secondary to orthostatic hypotension related to his Parkinson's disease versus from generalized weakness from acute COVID-19 viral infection. Fall precautions PT evaluation  Orthostatic hypotension Secondary to known Parkinson's disease Continue midodrine  Parkinson's disease Continue Sinemet and entacapone   Seizure ([place]) Place patient on seizure precautions Continue Keppra 750 mg twice daily  Hypereosinophilic syndrome (hes), unspecified Stable Continue prednisone 30 mg daily  Depression Continue sertraline  Stage 3a chronic kidney disease (CKD) (HCC) Renal function is stable  GERD (gastroesophageal reflux disease) Continue PPI  Generalized weakness Patient presents to the ER for evaluation of generalized weakness and frequent falls following recent diagnosis of COVID-19 viral illness. Place patient on fall precautions PT evaluation  COVID-19 virus infection Patient was diagnosed with COVID-19 viral infection 8 days prior to this hospitalization and completed a course of oral antiviral agent. Continue supportive care with bronchodilator therapy, antitussives, vitamins Continue airborne and contact precautions  Cognitive impairment Secondary to Parkinson's disease Continue Namenda  COPD (chronic obstructive pulmonary disease) ([place]) Continue as needed bronchodilator therapy as well as inhaled steroids   Consultants:  Neurology Procedures performed: None Disposition: Hospice care Diet recommendation:  Discharge Diet Orders (From admission, onward)     Start     Ordered   01/07/22 0000  Diet - low sodium heart healthy        01/07/22 1226           Cardiac diet DISCHARGE MEDICATION: Allergies as of 01/07/2022   No Known Allergies      Medication List     STOP taking these medications    cyanocobalamin 1000 MCG tablet   Nayzilam 5 MG/0.1ML Soln Generic drug: Midazolam       TAKE these medications    albuterol 108 (90 Base) MCG/ACT inhaler Commonly known as: VENTOLIN HFA Inhale 1 puff into the lungs every 6 (six) hours as needed for wheezing.   amantadine 100 MG capsule Commonly known as: SYMMETREL Take 100 mg by mouth 3 (three) times daily.   ascorbic acid 500 MG tablet Commonly known as: VITAMIN C Take 1 tablet (500 mg total) by mouth daily.   aspirin EC 81 MG tablet Take 81 mg by mouth daily. Swallow whole.   azithromycin 250 MG tablet Commonly known as: ZITHROMAX 1 tablet daily for next 4 days Start taking on: January 08, 2022   carbidopa-levodopa 25-100 MG tablet Commonly known as: SINEMET IR Take 2 tablets by mouth 3 (three) times daily.   carbidopa-levodopa 50-200 MG tablet Commonly known as: SINEMET CR Take 1 tablet by mouth at bedtime.   clopidogrel 75 MG tablet Commonly known as: PLAVIX Take 1 tablet (75 mg total) by mouth daily.   docusate sodium 100 MG capsule Commonly known as: COLACE Take 100 mg by mouth daily.   entacapone 200 MG tablet Commonly known as: COMTAN Take 200 mg by mouth 3 (three) times daily.   fluticasone-salmeterol 250-50 MCG/ACT Aepb Commonly  known as: ADVAIR Inhale 1 puff into the lungs as needed.   guaiFENesin 100 MG/5ML liquid Commonly known as: ROBITUSSIN Take 5 mLs by mouth every 4 (four) hours as needed for cough or to loosen phlegm.   levETIRAcetam 500 MG tablet Commonly known as: KEPPRA Take 500 mg by mouth 2  (two) times daily. Take along with '250mg'$  for total of '750mg'$  What changed: Another medication with the same name was changed. Make sure you understand how and when to take each.   levETIRAcetam 250 MG tablet Commonly known as: Keppra Take 1 tablet (250 mg total) by mouth 2 (two) times daily. What changed: additional instructions   memantine 5 MG tablet Commonly known as: NAMENDA Take 5 mg by mouth daily.   midodrine 10 MG tablet Commonly known as: PROAMATINE Take 10 mg by mouth 3 (three) times daily with meals.   Myrbetriq 50 MG Tb24 tablet Generic drug: mirabegron ER Take 50 mg by mouth at bedtime.   omeprazole 20 MG capsule Commonly known as: PRILOSEC Take 1 capsule (20 mg total) by mouth daily.   pravastatin 10 MG tablet Commonly known as: PRAVACHOL Take 10 mg by mouth at bedtime.   predniSONE 10 MG tablet Commonly known as: DELTASONE Take 3 tablets (30 mg total) by mouth daily with breakfast.   QUEtiapine 50 MG tablet Commonly known as: SEROQUEL Take 1 tablet (50 mg total) by mouth 2 (two) times daily.   sertraline 50 MG tablet Commonly known as: ZOLOFT Take 50 mg by mouth daily.   tamsulosin 0.4 MG Caps capsule Commonly known as: FLOMAX Take 1 capsule (0.4 mg total) by mouth daily after supper.   tiZANidine 4 MG tablet Commonly known as: ZANAFLEX Take 4 mg by mouth daily as needed for muscle spasms.   Trelegy Ellipta 100-62.5-25 MCG/ACT Aepb Generic drug: Fluticasone-Umeclidin-Vilant Inhale 1 puff into the lungs daily as needed.   zinc sulfate 220 (50 Zn) MG capsule Take 1 capsule (220 mg total) by mouth daily.        Follow-up Information     Tama High III, MD. Schedule an appointment as soon as possible for a visit in 1 week(s).   Specialty: Internal Medicine Contact information: [place] [address] [phone]                Discharge Exam: Danley Danker Weights   01/05/22 0450  Weight: 76.8  kg   General.  Ill-appearing gentleman, in no acute distress. Pulmonary.  Lungs clear bilaterally, normal respiratory effort. CV.  Regular rate and rhythm, no JVD, rub or murmur. Abdomen.  Soft, nontender, nondistended, BS positive. CNS.  Alert and oriented .  No focal neurologic deficit. Extremities.  No edema, no cyanosis, pulses intact and symmetrical. Psychiatry.  Judgment and insight appears normal.   Condition at discharge: stable  The results of significant diagnostics from this hospitalization (including imaging, microbiology, ancillary and laboratory) are listed below for reference.   Imaging Studies: US Carotid Bilateral  Result Date: 01/07/2022 CLINICAL DATA:  66 year old male with a history of stroke EXAM: BILATERAL CAROTID DUPLEX ULTRASOUND TECHNIQUE: Pearline Cables scale imaging, color Doppler and duplex ultrasound were performed of bilateral carotid and vertebral arteries in the neck. COMPARISON:  None Available. FINDINGS: Criteria: Quantification of carotid stenosis is based on velocity parameters that correlate the residual internal carotid diameter with NASCET-based stenosis levels, using the diameter of the distal internal carotid lumen as the denominator for stenosis measurement. The following velocity measurements  were obtained: RIGHT ICA:  Systolic 76 cm/sec, Diastolic 28 cm/sec CCA:  85 cm/sec SYSTOLIC ICA/CCA RATIO:  0.9 ECA:  72 cm/sec LEFT ICA:  Systolic 64 cm/sec, Diastolic 20 cm/sec CCA:  71 cm/sec SYSTOLIC ICA/CCA RATIO:  0.9 ECA:  83 cm/sec Right Brachial SBP: Not acquired Left Brachial SBP: Not acquired RIGHT CAROTID ARTERY: No significant calcifications of the right common carotid artery. Intermediate waveform maintained. Heterogeneous and partially calcified plaque at the right carotid bifurcation. No significant lumen shadowing. Low resistance waveform of the right ICA. No significant tortuosity. RIGHT VERTEBRAL ARTERY: Antegrade flow with low resistance waveform. LEFT  CAROTID ARTERY: No significant calcifications of the left common carotid artery. Intermediate waveform maintained. Heterogeneous and partially calcified plaque at the left carotid bifurcation without significant lumen shadowing. Low resistance waveform of the left ICA. No significant tortuosity. LEFT VERTEBRAL ARTERY:  Antegrade flow with low resistance waveform. IMPRESSION: Color duplex indicates minimal heterogeneous and calcified plaque, with no hemodynamically significant stenosis by duplex criteria in the extracranial cerebrovascular circulation. Signed, Dulcy Fanny. Nadene Rubins, RPVI [facility] Radiology Electronically Signed   By: Corrie Mckusick D.O.   On: 01/07/2022 07:58   ECHOCARDIOGRAM COMPLETE  Result Date: 01/06/2022    ECHOCARDIOGRAM REPORT   Patient Name:   MARSHON BANGS Date of Exam: 01/06/2022 Medical Rec #:  [id number]        Height:       67.0 in Accession #:    [id number]       Weight:       169.3 lb Date of Birth:  09-Feb-1956        BSA:          1.884 m Patient Age:    61 years         BP:           119/85 mmHg Patient Gender: M                HR:           109 bpm. Exam Location:  [facility] Procedure: 2D Echo, Color Doppler and Cardiac Doppler Indications:     I63.9 Stroke  History:         Patient has prior history of Echocardiogram examinations, most                  recent 05/24/2020. CAD; CKD, stage 3. Pt tested positive for                  COVID-19 on 01/05/22.  Sonographer:     Charmayne Sheer Referring Phys:  [id number] CZYSAYT Jazlynn Nemetz Diagnosing Phys: Isaias Cowman MD  Sonographer Comments: Suboptimal apical window. Image acquisition challenging due to respiratory motion. IMPRESSIONS  1. Left ventricular ejection fraction, by estimation, is 60 to 65%. The left ventricle has normal function. The left ventricle has no regional wall motion abnormalities. Left ventricular diastolic parameters were normal.  2. Right ventricular systolic function  is normal. The right ventricular size is normal.  3. The mitral valve is normal in structure. Mild mitral valve regurgitation. No evidence of mitral stenosis.  4. The aortic valve is normal in structure. Aortic valve regurgitation is not visualized. No aortic stenosis is present.  5. The inferior vena cava is normal in size with greater than 50% respiratory variability, suggesting right atrial pressure of 3 mmHg. FINDINGS  Left Ventricle: Left ventricular ejection fraction, by estimation, is 60 to 65%. The left ventricle has normal  function. The left ventricle has no regional wall motion abnormalities. The left ventricular internal cavity size was normal in size. There is  no left ventricular hypertrophy. Left ventricular diastolic parameters were normal. Right Ventricle: The right ventricular size is normal. No increase in right ventricular wall thickness. Right ventricular systolic function is normal. Left Atrium: Left atrial size was normal in size. Right Atrium: Right atrial size was normal in size. Pericardium: There is no evidence of pericardial effusion. Mitral Valve: The mitral valve is normal in structure. Mild mitral valve regurgitation. No evidence of mitral valve stenosis. Tricuspid Valve: The tricuspid valve is normal in structure. Tricuspid valve regurgitation is mild . No evidence of tricuspid stenosis. Aortic Valve: The aortic valve is normal in structure. Aortic valve regurgitation is not visualized. No aortic stenosis is present. Aortic valve mean gradient measures 2.0 mmHg. Aortic valve peak gradient measures 3.2 mmHg. Aortic valve area, by VTI measures 4.09 cm. Pulmonic Valve: The pulmonic valve was normal in structure. Pulmonic valve regurgitation is not visualized. No evidence of pulmonic stenosis. Aorta: The aortic root is normal in size and structure. Venous: The inferior vena cava is normal in size with greater than 50% respiratory variability, suggesting right atrial pressure of 3 mmHg.  IAS/Shunts: No atrial level shunt detected by color flow Doppler.  LEFT VENTRICLE PLAX 2D LVIDd:         3.90 cm   Diastology LVIDs:         2.50 cm   LV e' medial:    6.42 cm/s LV PW:         1.00 cm   LV E/e' medial:  7.4 LV IVS:        1.10 cm   LV e' lateral:   9.36 cm/s LVOT diam:     2.30 cm   LV E/e' lateral: 5.0 LV SV:         52 LV SV Index:   28 LVOT Area:     4.15 cm  RIGHT VENTRICLE RV Basal diam:  3.00 cm LEFT ATRIUM           Index LA diam:      2.60 cm 1.38 cm/m LA Vol (A2C): 13.5 ml 7.17 ml/m LA Vol (A4C): 26.4 ml 14.01 ml/m  AORTIC VALVE                    PULMONIC VALVE AV Area (Vmax):    3.97 cm     PV Vmax:       0.86 m/s AV Area (Vmean):   3.94 cm     PV Peak grad:  3.0 mmHg AV Area (VTI):     4.09 cm AV Vmax:           88.80 cm/s AV Vmean:          58.500 cm/s AV VTI:            0.128 m AV Peak Grad:      3.2 mmHg AV Mean Grad:      2.0 mmHg LVOT Vmax:         84.90 cm/s LVOT Vmean:        55.500 cm/s LVOT VTI:          0.126 m LVOT/AV VTI ratio: 0.98  AORTA Ao Root diam: 3.90 cm MITRAL VALVE MV Area (PHT): 6.12 cm    SHUNTS MV Decel Time: 124 msec    Systemic VTI:  0.13 m MV E velocity: 47.20 cm/s  Systemic Diam: 2.30 cm MV A velocity: 76.50 cm/s MV E/A ratio:  0.62 Isaias Cowman MD Electronically signed by Isaias Cowman MD Signature Date/Time: 01/06/2022/1:20:12 PM    Final    MR BRAIN WO CONTRAST  Result Date: 01/05/2022 CLINICAL DATA:  Delirium. Additional history provided: History of seizure disorder, Parkinson's disease, coronary artery disease, CKD. EXAM: MRI HEAD WITHOUT CONTRAST TECHNIQUE: Multiplanar, multiecho pulse sequences of the brain and surrounding structures were obtained without intravenous contrast. COMPARISON:  Prior head CT examinations 01/05/2022 and earlier. Brain MRI 08/13/2021. FINDINGS: Intermittently motion degraded examination, limiting evaluation. Most notably, the axial T2 FLAIR sequence is moderately motion degraded and the coronal T2  sequence through the hippocampi is severely motion degraded. Brain: Mild generalized cerebral atrophy. Small acute cortically-based infarct within the right occipital lobe (PCA vascular territory) (for instance as seen on series 5, image 24). Subcentimeter acute cortical infarct within the left occipital lobe (PCA vascular territory) (series 5, image 27). 11 mm acute/early subacute infarct within the subcortical white matter of the mid-to-posterior left frontal lobe (series 5, image 41) (series 7, image 22). 2 mm acute/early subacute infarct within the left parietal white matter (series 5, image 42). Background moderate multifocal T2 FLAIR hyperintense signal abnormality within the cerebral white matter, nonspecific but compatible with chronic small vessel ischemic disease. Small chronic infarct within the left cerebellar hemisphere, new from the prior MRI of 08/13/2021. Within the limitations of motion degradation, no appreciable hippocampal size or signal asymmetry. No evidence of an intracranial mass. No chronic intracranial blood products. No extra-axial fluid collection. No midline shift. Vascular: Maintained flow voids within the proximal large arterial vessels. Skull and upper cervical spine: No focal suspicious marrow lesion. Sinuses/Orbits: No mass or acute finding within the imaged orbits. No significant paranasal sinus disease. IMPRESSION: 1. Small acute cortically-based infarct within the right occipital lobe (PCA vascular territory). 2. Subcentimeter acute cortical infarct within the left occipital lobe (PCA vascular territory). 3. Two additional small acute/early subacute infarcts, one within the mid-to-posterior left frontal lobe white matter and the other within the left parietal white matter. 4. Given that the described infarcts involve multiple vascular territories, consider an embolic process. 5. Background moderate chronic small vessel ischemic changes within the cerebral white matter. 6. Small  chronic infarct within the left cerebellar hemisphere, new from the prior MRI of 08/13/2021. 7. Mild generalized cerebral atrophy. Electronically Signed   By: Kellie Simmering D.O.   On: 01/05/2022 11:35   CT HEAD WO CONTRAST (5MM)  Result Date: 01/05/2022 CLINICAL DATA:  Seizure-like activity. EXAM: CT HEAD WITHOUT CONTRAST TECHNIQUE: Contiguous axial images were obtained from the base of the skull through the vertex without intravenous contrast. RADIATION DOSE REDUCTION: This exam was performed according to the departmental dose-optimization program which includes automated exposure control, adjustment of the mA and/or kV according to patient size and/or use of iterative reconstruction technique. COMPARISON:  07/07/2018.  Brain MRI 08/13/2021 FINDINGS: Brain: Small area of cortical and white matter low-density in the right occipital lobe. No hemorrhage, hydrocephalus, extra-axial collection or mass lesion/mass effect. Small remote left cerebellar infarct which has occurred in the interim. Mild cerebral volume loss. Vascular: No hyperdense vessel or unexpected calcification. Skull: Normal. Negative for fracture or focal lesion. Sinuses/Orbits: No acute finding. IMPRESSION: 1. Small right occipital cortex infarct which is age indeterminate, new since 08/13/2021. 2. Small chronic left cerebellar infarct which is also new from 08/13/2021. Electronically Signed   By: Jorje Guild M.D.   On: 01/05/2022 05:10  DG Chest Port 1 View  Result Date: 01/05/2022 CLINICAL DATA:  Possible seizure EXAM: PORTABLE CHEST 1 VIEW COMPARISON:  02/13/2022 radiograph and chest CT 12/04/2021. FINDINGS: Normal heart size and mediastinal contours. Coarsening at the bases correlating with interstitial opacity on chest CT 12/04/2021. No acute infiltrate or edema. No effusion or pneumothorax. No acute osseous findings. IMPRESSION: No acute finding when compared to prior. Electronically Signed   By: Jorje Guild M.D.   On: 01/05/2022  04:33    Microbiology: Results for orders placed or performed during the hospital encounter of 01/05/22  Blood culture (routine x 2)     Status: None (Preliminary result)   Collection Time: 01/05/22  4:09 AM   Specimen: BLOOD LEFT FOREARM  Result Value Ref Range Status   Specimen Description BLOOD LEFT FOREARM  Final   Special Requests   Final    BOTTLES DRAWN AEROBIC AND ANAEROBIC Blood Culture results may not be optimal due to an excessive volume of blood received in culture bottles   Culture   Final    NO GROWTH 2 DAYS Performed at [facility], [address], [place], [place] [address]    Report Status PENDING  Incomplete  Blood culture (routine x 2)     Status: None (Preliminary result)   Collection Time: 01/05/22  4:09 AM   Specimen: BLOOD LEFT HAND  Result Value Ref Range Status   Specimen Description BLOOD LEFT HAND  Final   Special Requests   Final    BOTTLES DRAWN AEROBIC AND ANAEROBIC Blood Culture results may not be optimal due to an excessive volume of blood received in culture bottles   Culture   Final    NO GROWTH 2 DAYS Performed at [facility], [address] [place], [place] [address]    Report Status PENDING  Incomplete  SARS Coronavirus 2 by RT PCR (hospital order, performed in [facility] hospital lab) *cepheid single result test* Anterior Nasal Swab     Status: Abnormal   Collection Time: 01/05/22  5:44 AM   Specimen: Anterior Nasal Swab  Result Value Ref Range Status   SARS Coronavirus 2 by RT PCR POSITIVE (A) NEGATIVE Final    Comment: (NOTE) SARS-CoV-2 target nucleic acids are DETECTED  SARS-CoV-2 RNA is generally detectable in upper respiratory specimens  during the acute phase of infection.  Positive results are indicative  of the presence of the identified virus, but do not rule out bacterial infection or co-infection with other pathogens not detected by the test.  Clinical correlation with patient history and  other  diagnostic information is necessary to determine patient infection status.  The expected result is negative.  Fact Sheet for Patients:   https://www.patel.info/   Fact Sheet for Healthcare Providers:   https://hall.com/    This test is not yet approved or cleared by the [place] FDA and  has been authorized for detection and/or diagnosis of SARS-CoV-2 by FDA under an Emergency Use Authorization (EUA).  This EUA will remain in effect (meaning this test can be used) for the duration of  the COVID-19 declaration under Section 564(b)(1)  of the Act, 21 U.S.C. section 360-bbb-3(b)(1), unless the authorization is terminated or revoked sooner.   Performed at [facility], [address] [place], [place] [address]     Labs: CBC: Recent Labs  Lab 01/05/22 0408 01/06/22 0558  WBC 12.4* 9.8  NEUTROABS 9.0*  --   HGB 13.0 12.6*  HCT 39.5 38.5*  MCV  89.0 90.0  PLT 172 548   Basic Metabolic Panel: Recent Labs  Lab 01/05/22 0408 01/06/22 0558  NA 137 141  K 4.0 3.8  CL 108 110  CO2 22 24  GLUCOSE 88 71  BUN 32* 28*  CREATININE 1.78* 1.43*  CALCIUM 8.4* 8.8*   Liver Function Tests: Recent Labs  Lab 01/05/22 0408  AST 18  ALT 7  ALKPHOS 53  BILITOT 1.0  PROT 6.4*  ALBUMIN 3.1*   CBG: No results for input(s): "GLUCAP" in the last 168 hours.  Discharge time spent: greater than 30 minutes.  This record has been created using Systems analyst. Errors have been sought and corrected,but may not always be located. Such creation errors do not reflect on the standard of care.   Signed: Lorella Nimrod, MD [facility] 01/07/2022

## 2022-01-07 NOTE — Plan of Care (Signed)
  Problem: Education: Goal: Knowledge of General Education information will improve Description: Including pain rating scale, medication(s)/side effects and non-pharmacologic comfort measures 01/07/2022 1239 by Alferd Apa, RN Outcome: Adequate for Discharge 01/07/2022 0837 by Alferd Apa, RN Outcome: Progressing   Problem: Health Behavior/Discharge Planning: Goal: Ability to manage health-related needs will improve 01/07/2022 1239 by Alferd Apa, RN Outcome: Adequate for Discharge 01/07/2022 0837 by Alferd Apa, RN Outcome: Progressing   Problem: Clinical Measurements: Goal: Ability to maintain clinical measurements within normal limits will improve 01/07/2022 1239 by Alferd Apa, RN Outcome: Adequate for Discharge 01/07/2022 0837 by Alferd Apa, RN Outcome: Progressing Goal: Will remain free from infection 01/07/2022 1239 by Alferd Apa, RN Outcome: Adequate for Discharge 01/07/2022 [phone] by Alferd Apa, RN Outcome: Progressing Goal: Diagnostic test results will improve 01/07/2022 1239 by Alferd Apa, RN Outcome: Adequate for Discharge 01/07/2022 0837 by Alferd Apa, RN Outcome: Progressing Goal: Respiratory complications will improve 01/07/2022 1239 by Alferd Apa, RN Outcome: Adequate for Discharge 01/07/2022 [phone] by Alferd Apa, RN Outcome: Progressing Goal: Cardiovascular complication will be avoided 01/07/2022 1239 by Alferd Apa, RN Outcome: Adequate for Discharge 01/07/2022 0837 by Alferd Apa, RN Outcome: Progressing   Problem: Activity: Goal: Risk for activity intolerance will decrease 01/07/2022 1239 by Alferd Apa, RN Outcome: Adequate for Discharge 01/07/2022 [phone] by Alferd Apa, RN Outcome: Progressing   Problem: Nutrition: Goal: Adequate nutrition will be maintained 01/07/2022 1239 by Alferd Apa, RN Outcome: Adequate for Discharge 01/07/2022 0837 by Alferd Apa, RN Outcome: Progressing   Problem: Coping: Goal: Level of anxiety  will decrease 01/07/2022 1239 by Alferd Apa, RN Outcome: Adequate for Discharge 01/07/2022 0837 by Alferd Apa, RN Outcome: Progressing   Problem: Elimination: Goal: Will not experience complications related to bowel motility 01/07/2022 1239 by Alferd Apa, RN Outcome: Adequate for Discharge 01/07/2022 0837 by Alferd Apa, RN Outcome: Progressing Goal: Will not experience complications related to urinary retention 01/07/2022 1239 by Alferd Apa, RN Outcome: Adequate for Discharge 01/07/2022 0837 by Alferd Apa, RN Outcome: Progressing   Problem: Pain Managment: Goal: General experience of comfort will improve 01/07/2022 1239 by Alferd Apa, RN Outcome: Adequate for Discharge 01/07/2022 0837 by Alferd Apa, RN Outcome: Progressing   Problem: Safety: Goal: Ability to remain free from injury will improve 01/07/2022 1239 by Alferd Apa, RN Outcome: Adequate for Discharge 01/07/2022 0837 by Alferd Apa, RN Outcome: Progressing   Problem: Skin Integrity: Goal: Risk for impaired skin integrity will decrease 01/07/2022 1239 by Alferd Apa, RN Outcome: Adequate for Discharge 01/07/2022 0837 by Alferd Apa, RN Outcome: Progressing

## 2022-01-07 NOTE — Progress Notes (Addendum)
Palliative: Mr. Harjot, [place], is resting quietly in bed.  He appears acutely/chronically ill and frail.  He is alert and oriented x3, able to make his basic needs known.  His wife of 32 years, [name], is present at bedside.  We talk in detail about Mr. Lineback's acute on chronic health concerns.  We talk about his cough, turn cough deep breathe, and the importance of continued mobility.  [name] shares that it is important for Tim to be able to stand and pivot for transfers for best care/quality at home.  We talk about how he participated in PT, his abilities.  We talk about home with home health versus home with hospice.  Octavia Bruckner shares that he feels that he can do PT on his own.  I offered advice on managing PT.  We talked about the benefits of at home hospice for "treat the treatable" hospice care.  We also talk about qualifiers for residential hospice.  We talked about prognosis with permission.  I shared that functional status is the best indicator of prognosis.  I asked him if he would be surprised if he were gone by Christmas.  He tells me this would not surprise him.   Conference with attending, bedside nursing staff, transition of care team related to patient condition, needs, goals of care, disposition.  Plan: At this point continue to treat the treatable but no CPR or intubation.  Time for outcomes.  Home with the benefits of [place] for "treat the treatable" hospice care. DNR/goldenrod form on chart.   32 minutes Quinn Axe, NP Palliative medicine team Team phone [phone] Greater than 50% of this time was spent counseling and coordinating care related to the above assessment and plan.

## 2022-01-07 NOTE — Progress Notes (Signed)
[facility] ([facility])    If applicable, please send signed and completed DNR with patient/family upon discharge. Please provide prescriptions at discharge as needed to ensure ongoing symptom management and a transport packet.   [facility] information and contact numbers given to family and above information shared with TOC.    Please call with any questions/concerns.    Thank you for the opportunity to participate in this patient's care   Shania Daniel, MSW [facility] Hospital Liaison  [phone]  

## 2022-01-07 NOTE — Plan of Care (Signed)

## 2022-01-07 NOTE — TOC Progression Note (Signed)
Transition of Care [facility]) - Progression Note    Patient Details  Name: Cory [name] MRN: [id number] Date of Birth: December 17, 1955  Transition of [facility]) CM/SW [place], RN Phone Number: 01/07/2022, 12:53 PM  Clinical Narrative:     Wife in the room, Patient will need EMS to transport home EMS called and there are 4 ahead of him       Expected Discharge Plan and Services           Expected Discharge Date: 01/07/22                                     Social Determinants of Health (SDOH) Interventions Housing Interventions: Intervention Not Indicated  Readmission Risk Interventions     No data to display

## 2022-01-10 LAB — CULTURE, BLOOD (ROUTINE X 2)
Culture: NO GROWTH
Culture: NO GROWTH

## 2022-01-11 ENCOUNTER — Encounter: Payer: Self-pay | Admitting: Oncology

## 2022-01-15 ENCOUNTER — Ambulatory Visit: Admission: RE | Admit: 2022-01-15 | Payer: Medicare PPO | Source: Ambulatory Visit

## 2022-01-15 ENCOUNTER — Telehealth: Payer: Self-pay | Admitting: *Deleted

## 2022-01-15 NOTE — Telephone Encounter (Signed)
Please cancel all upcoming appts since he is now on hospice.

## 2022-01-15 NOTE — Telephone Encounter (Signed)
[name] called wanting Dr Tasia Catchings to know that patient had contracted COVID and it hit him hard. He is now discharged from  hospital and is under Hospice care

## 2022-01-19 LAB — BLOOD GAS, VENOUS
Acid-Base Excess: 2.8 mmol/L — ABNORMAL HIGH (ref 0.0–2.0)
Bicarbonate: 26.1 mmol/L (ref 20.0–28.0)
O2 Saturation: 83.4 %
Patient temperature: 37
pCO2, Ven: 35 mmHg — ABNORMAL LOW (ref 44–60)
pH, Ven: 7.48 — ABNORMAL HIGH (ref 7.25–7.43)
pO2, Ven: 48 mmHg — ABNORMAL HIGH (ref 32–45)

## 2022-02-01 ENCOUNTER — Encounter (INDEPENDENT_AMBULATORY_CARE_PROVIDER_SITE_OTHER): Payer: Self-pay

## 2022-02-03 DEATH — deceased

## 2022-02-15 ENCOUNTER — Ambulatory Visit: Payer: Medicare PPO | Admitting: Oncology

## 2022-02-15 ENCOUNTER — Other Ambulatory Visit: Payer: Medicare PPO

## 2022-03-17 ENCOUNTER — Ambulatory Visit: Admit: 2022-03-17 | Payer: Medicare PPO | Admitting: Internal Medicine

## 2022-03-17 SURGERY — COLONOSCOPY
Anesthesia: General

## 2022-10-22 IMAGING — CT CT HEAD CODE STROKE
4 series · 16 of 47 positions shown, 18 images · non-contrast
Comparison: 04/15/2021

CLINICAL DATA: Code stroke. Neuro deficit, acute, stroke suspected.
Sudden onset of speech disturbance and vision loss. Right-sided
headache.



[Series 3: head bone · axial · 0.40mm/px · z∈[-110,-80]mm · 3 of 76 slices shown]
[im 8/76  bone]
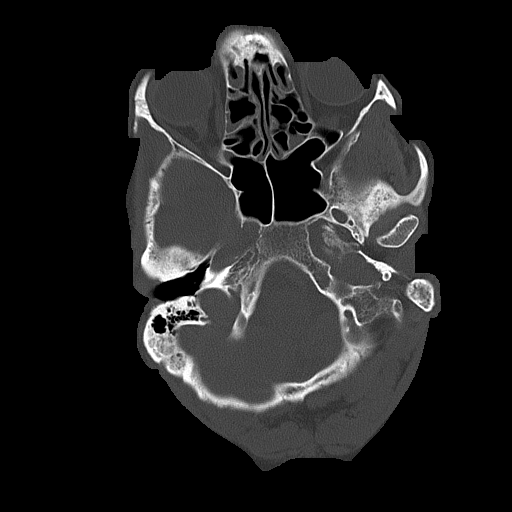
[im 16/76  bone]
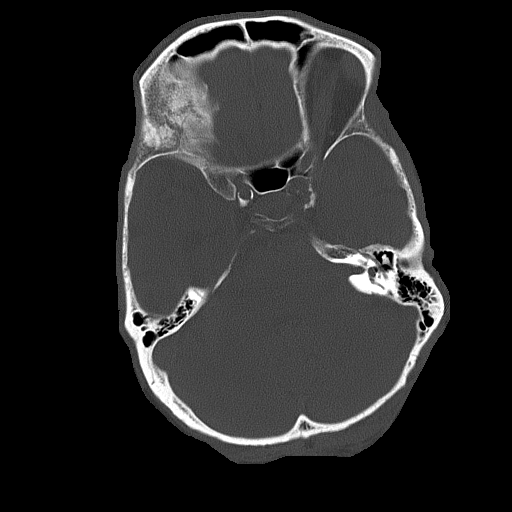
[im 23/76  bone]
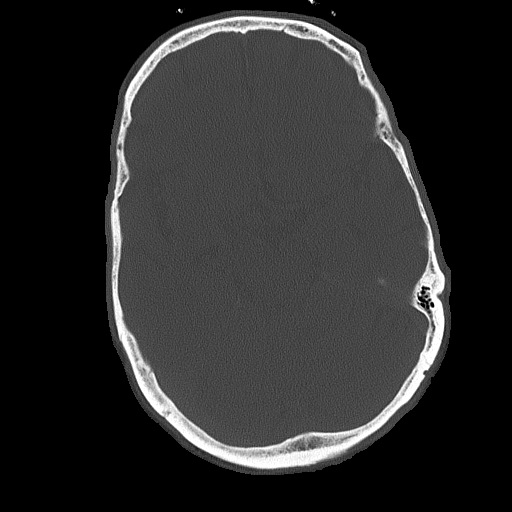

[Series 4: head wo · axial · 0.40mm/px · z∈[-109,+6]mm · 7 of 31 slices shown, 9 images]
[im 4/31  brain]
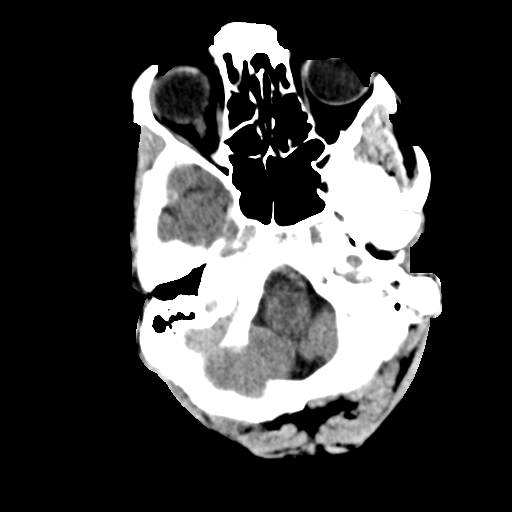
[im 4/31  bone]
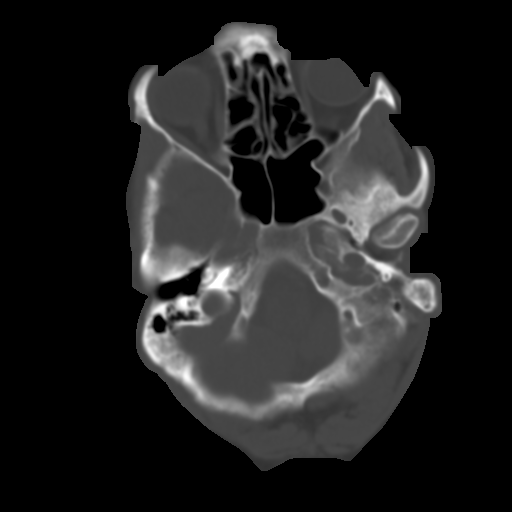
[im 8/31  brain]
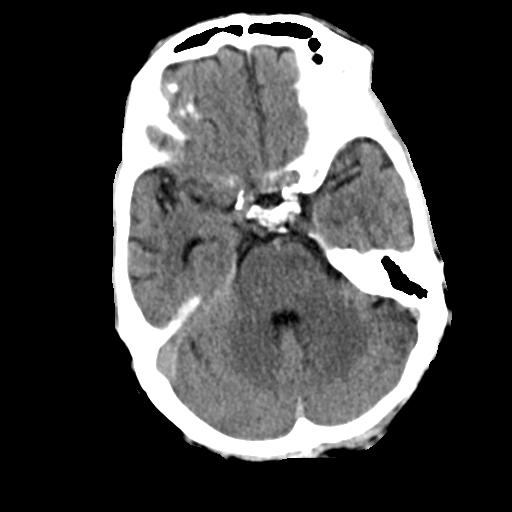
[im 12/31  brain]
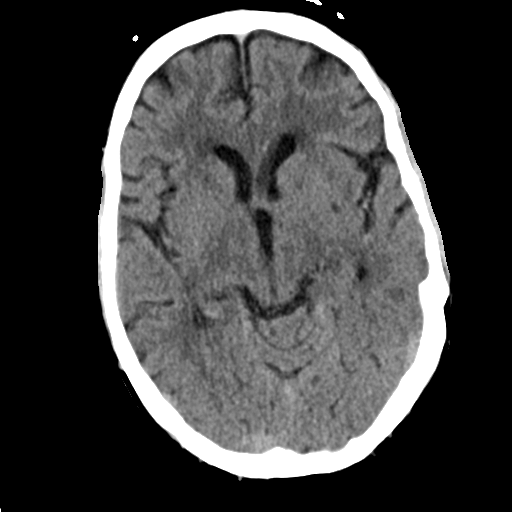
[im 16/31  brain]
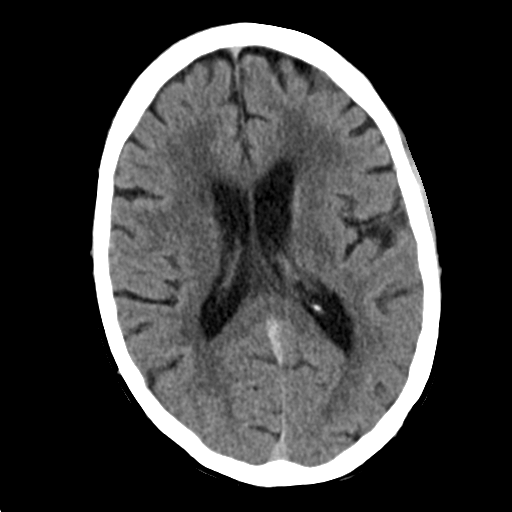
[im 19/31  brain]
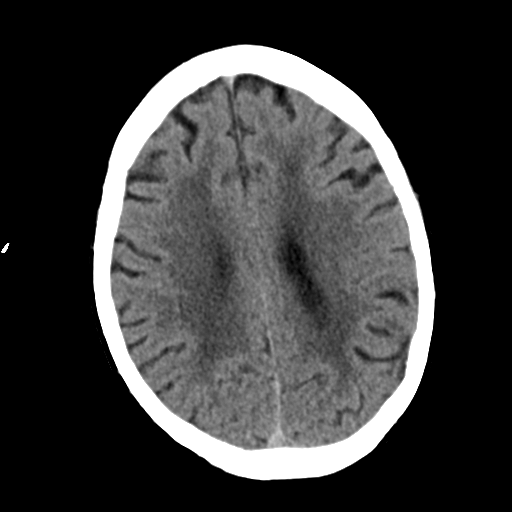
[im 19/31  bone]
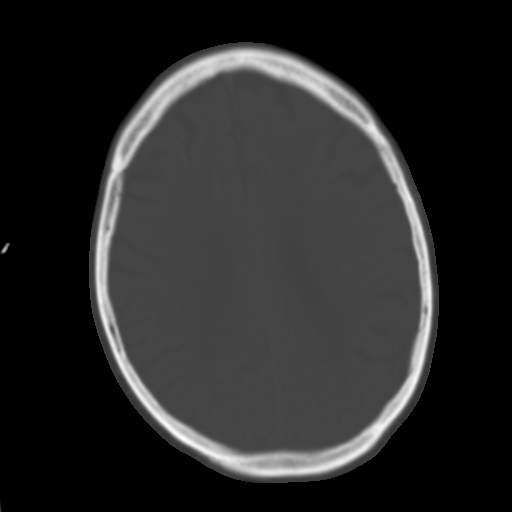
[im 23/31  brain]
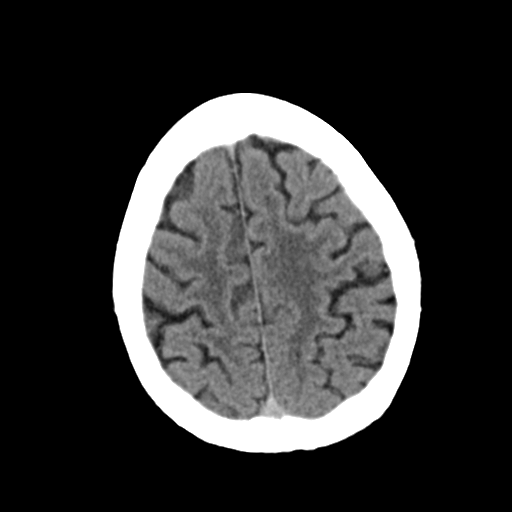
[im 27/31  brain]
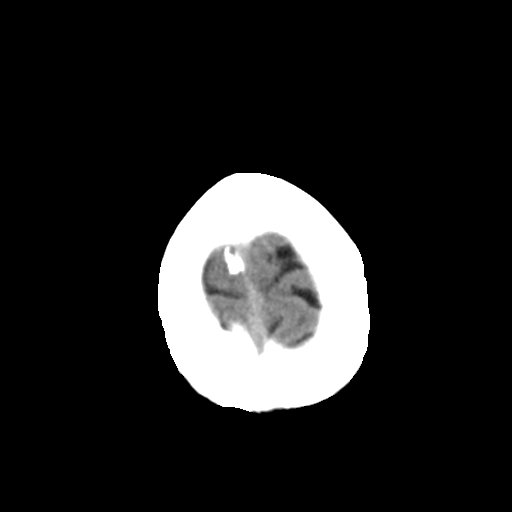

[Series 5: coronal soft tissue · coronal · 0.32mm/px · 3 of 66 slices shown]
[im 22/66  brain]
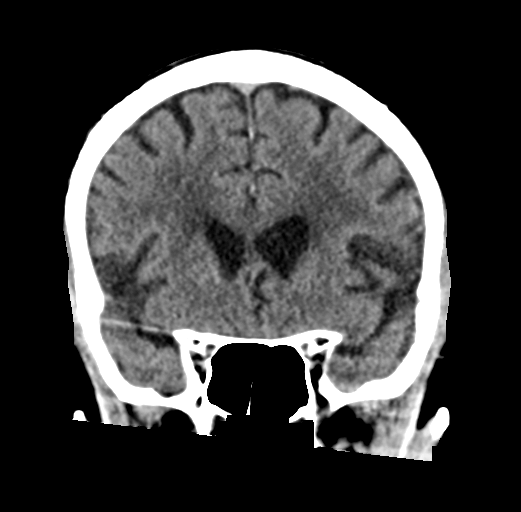
[im 29/66  brain]
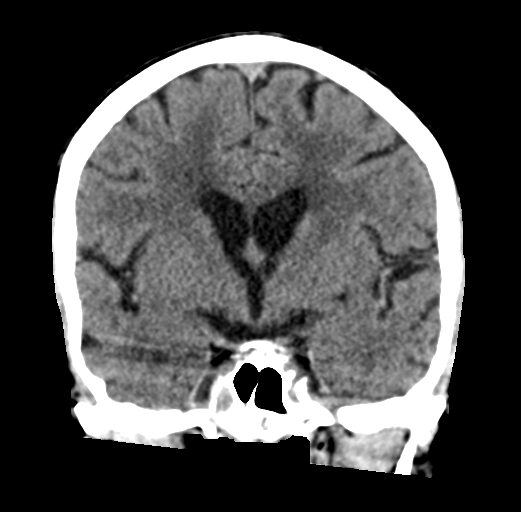
[im 37/66  brain]
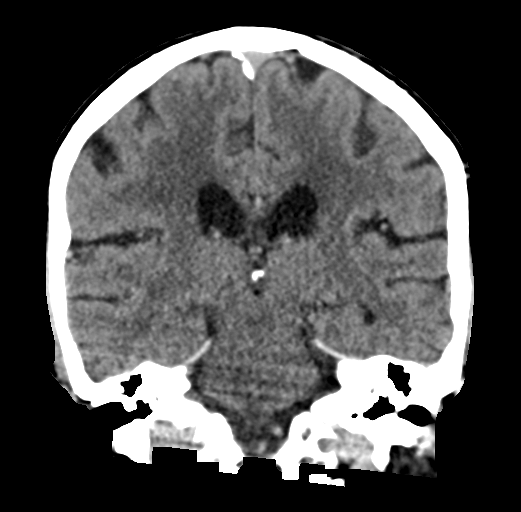

[Series 6: sagittal soft tissue · sagittal · 0.32mm/px · 3 of 57 slices shown]
[im 20/57  brain]
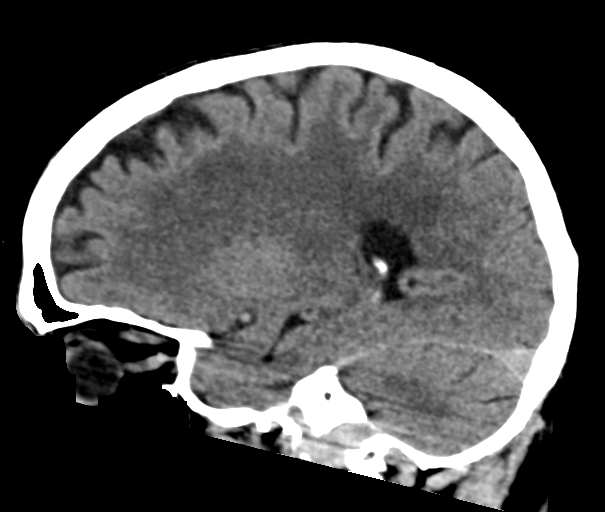
[im 29/57  brain]
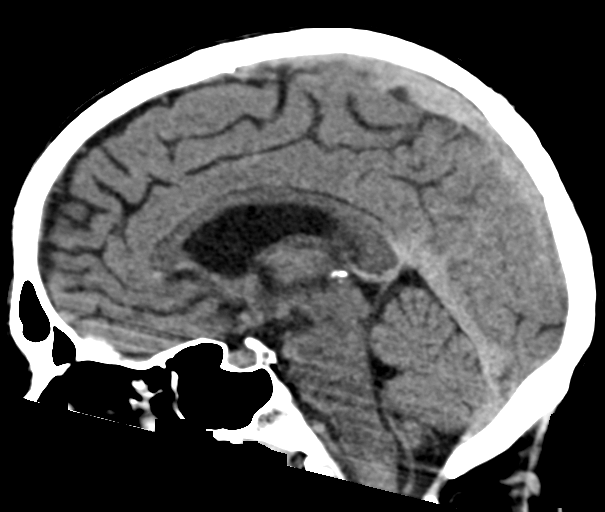
[im 37/57  brain]
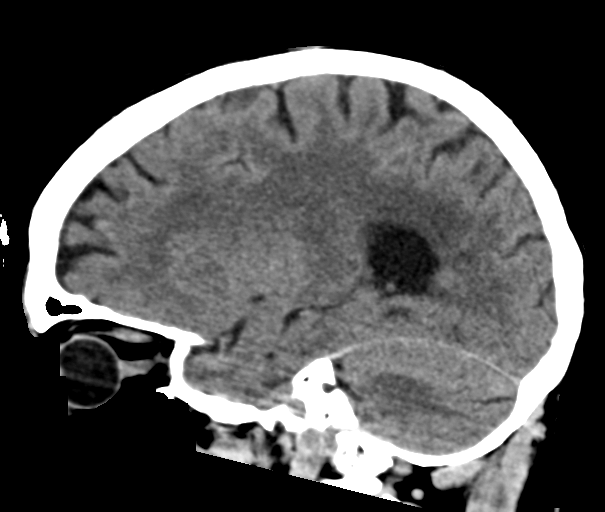

[16 of 47 positions shown; findings below may reference images not displayed]

FINDINGS: Brain: Mild age related volume loss as seen previously. Chronic
small-vessel ischemic changes of the cerebral hemispheric white
matter as seen previously. No sign of acute infarction, mass lesion,
hemorrhage, hydrocephalus or extra-axial collection.

Vascular: There is atherosclerotic calcification of the major
vessels at the base of the brain.

Skull: Negative

Sinuses/Orbits: Clear/normal

Other: None

ASPECTS (Alberta Stroke Program Early CT Score)

- Ganglionic level infarction (caudate, lentiform nuclei, internal
capsule, insula, M1-M3 cortex): 7

- Supraganglionic infarction (M4-M6 cortex): 3

Total score (0-10 with 10 being normal): 10
IMPRESSION: 1. No acute CT finding. Age related volume loss. Chronic
small-vessel ischemic changes of the white matter.
2. ASPECTS is 10.
3. These results were communicated to Dr. Marcucci at [DATE] on
07/06/2021 by text page via the [employer] messaging system.
